# Patient Record
Sex: Male | Born: 1980
Health system: Southern US, Community
[De-identification: ages and names within clinical notes are randomized; demographics above are authoritative.]

## PROBLEM LIST (undated history)

## (undated) DIAGNOSIS — B958 Unspecified staphylococcus as the cause of diseases classified elsewhere: Secondary | ICD-10-CM

---
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
## 2001-03-22 ENCOUNTER — Encounter: Payer: Self-pay | Admitting: Family Medicine

## 2001-03-22 ENCOUNTER — Encounter: Admission: RE | Admit: 2001-03-22 | Discharge: 2001-03-22 | Payer: Self-pay | Admitting: Family Medicine

## 2019-07-22 ENCOUNTER — Other Ambulatory Visit: Payer: Self-pay

## 2019-07-22 ENCOUNTER — Emergency Department (HOSPITAL_COMMUNITY)
Admission: EM | Admit: 2019-07-22 | Discharge: 2019-07-22 | Disposition: A | Discharge status: Home / Self Care | Payer: 59 | Attending: Emergency Medicine | Admitting: Emergency Medicine

## 2019-07-22 DIAGNOSIS — Y999 Unspecified external cause status: Secondary | ICD-10-CM | POA: Diagnosis not present

## 2019-07-22 DIAGNOSIS — Y939 Activity, unspecified: Secondary | ICD-10-CM | POA: Insufficient documentation

## 2019-07-22 DIAGNOSIS — X58XXXA Exposure to other specified factors, initial encounter: Secondary | ICD-10-CM | POA: Diagnosis not present

## 2019-07-22 DIAGNOSIS — M5431 Sciatica, right side: Secondary | ICD-10-CM | POA: Diagnosis not present

## 2019-07-22 DIAGNOSIS — Y929 Unspecified place or not applicable: Secondary | ICD-10-CM | POA: Insufficient documentation

## 2019-07-22 DIAGNOSIS — S39012A Strain of muscle, fascia and tendon of lower back, initial encounter: Secondary | ICD-10-CM | POA: Insufficient documentation

## 2019-07-22 DIAGNOSIS — S3992XA Unspecified injury of lower back, initial encounter: Secondary | ICD-10-CM | POA: Diagnosis present

## 2019-07-22 MED ORDER — METAXALONE 800 MG PO TABS: 800.0000 mg | ORAL_TABLET | Freq: Three times a day (TID) | ORAL | 0 refills | Status: DC

## 2019-07-22 MED ORDER — TRAMADOL HCL 50 MG PO TABS: 50.0000 mg | ORAL_TABLET | Freq: Four times a day (QID) | ORAL | 0 refills | Status: DC | PRN

## 2019-07-22 MED ORDER — PREDNISONE 50 MG PO TABS: 50.0000 mg | ORAL_TABLET | Freq: Every day | ORAL | 0 refills | Status: DC

## 2019-07-22 NOTE — ED Triage Notes (Signed)
Pt states that approx 5 days ago he started having lower back spasms. Was seen at urgent care who prescribed cyclobenzaprine 5mg  and diclofenac 50mg . Pt states that he thinks he is having adverse side effects to these medications. Muscle relaxer is making him "feel like a zombie" and antiinflammatory, although worked for pain, made his urine dark which caused him to DC taking meds. Pt in pain and having spasms now that he is off of these meds.

## 2019-07-22 NOTE — ED Provider Notes (Signed)
[facility] DEPT Provider Note   CSN: [id number] Arrival date & time: 07/22/19  2051     History   Chief Complaint Chief Complaint  Patient presents with  . Back Pain    HPI William [name] is a 38 y.o. male.     HPI Patient presents to the emergency department with lower back pain that started 5 days ago.  The patient states he was seen in urgent care and given muscle relaxants which he states made him feel like a zombie.  He states that he also got a NSAID which she stated made his urine darker.  The patient states he stopped taking that but it did help with the pain.  The patient states that nothing seems make his condition worse other than certain positions and movements.  Patient states that he is never had any significant back troubles in the past.  The patient denies chest pain, shortness of breath, headache,blurred vision, neck pain, fever, cough, weakness, numbness, dizziness, anorexia, edema, abdominal pain, nausea, vomiting, diarrhea, rash, dysuria, hematemesis, bloody stool, near syncope, or syncope. No past medical history on file.  There are no active problems to display for this patient.         Home Medications    Prior to Admission medications   Not on File    Family History No family history on file.  Social History Social History   Tobacco Use  . Smoking status: Not on file  Substance Use Topics  . Alcohol use: Not on file  . Drug use: Not on file     Allergies   Patient has no allergy information on record.   Review of Systems Review of Systems All other systems negative except as documented in the HPI. All pertinent positives and negatives as reviewed in the HPI.  Physical Exam Updated Vital Signs BP (!) 158/97 (BP Location: Right Arm)   Pulse (!) 104   Temp 98 F (36.7 C) (Oral)   Resp 18   Ht 6\' 2"  (1.88 m)   Wt 88 kg   SpO2 94%   BMI 24.91 kg/m   Physical Exam Vitals signs and nursing note  reviewed.  Constitutional:      General: He is not in acute distress.    Appearance: He is well-developed.  HENT:     Head: Normocephalic and atraumatic.  Eyes:     Pupils: Pupils are equal, round, and reactive to light.  Pulmonary:     Effort: Pulmonary effort is normal.  Musculoskeletal:     Lumbar back: He exhibits tenderness and pain. He exhibits normal range of motion, no bony tenderness, no swelling and no deformity.  Skin:    General: Skin is warm and dry.  Neurological:     Mental Status: He is alert and oriented to person, place, and time.     GCS: GCS eye subscore is 4. GCS verbal subscore is 5. GCS motor subscore is 6.     Sensory: Sensation is intact. No sensory deficit.     Motor: No weakness or abnormal muscle tone.     Coordination: Coordination normal.     Gait: Gait normal.     Deep Tendon Reflexes: Reflexes are normal and symmetric.      ED Treatments / Results  Labs (all labs ordered are listed, but only abnormal results are displayed) Labs Reviewed - No data to display  EKG None  Radiology No results found.  Procedures Procedures (including critical care  time)  Medications Ordered in ED Medications - No data to display   Initial Impression / Assessment and Plan / ED Course  I have reviewed the triage vital signs and the nursing notes.  Pertinent labs & imaging results that were available during my care of the patient were reviewed by me and considered in my medical decision making (see chart for details).       Patient has no neurological deficits noted on exam.  I do feel that he has a lumbar sacral strain and that he has some sciatic irritation as well.  Have advised the patient to use ice and heat on his lower back.  Told to return here as needed.  We will give steroids and pain control.  Told to follow-up with a primary doctor.   Final Clinical Impressions(s) / ED Diagnoses   Final diagnoses:  None    ED Discharge Orders    None        Charlestine NightLawyer, Masud Holub, PA-C 07/22/19 2209    Samuel JesterMcManus, Kathleen, DO 07/26/19 [phone]

## 2019-07-31 ENCOUNTER — Encounter (HOSPITAL_COMMUNITY): Payer: Self-pay

## 2019-07-31 ENCOUNTER — Inpatient Hospital Stay (HOSPITAL_COMMUNITY)
Admission: EM | Admit: 2019-07-31 | Discharge: 2019-10-03 | DRG: 853 | Disposition: A | Discharge status: Home / Self Care | Payer: BC Managed Care – PPO | Attending: Family Medicine | Admitting: Family Medicine

## 2019-07-31 ENCOUNTER — Other Ambulatory Visit: Payer: Self-pay

## 2019-07-31 DIAGNOSIS — J188 Other pneumonia, unspecified organism: Secondary | ICD-10-CM | POA: Diagnosis not present

## 2019-07-31 DIAGNOSIS — I083 Combined rheumatic disorders of mitral, aortic and tricuspid valves: Secondary | ICD-10-CM | POA: Diagnosis not present

## 2019-07-31 DIAGNOSIS — Z8739 Personal history of other diseases of the musculoskeletal system and connective tissue: Secondary | ICD-10-CM | POA: Diagnosis present

## 2019-07-31 DIAGNOSIS — S21109A Unspecified open wound of unspecified front wall of thorax without penetration into thoracic cavity, initial encounter: Secondary | ICD-10-CM | POA: Diagnosis not present

## 2019-07-31 DIAGNOSIS — M4628 Osteomyelitis of vertebra, sacral and sacrococcygeal region: Secondary | ICD-10-CM | POA: Diagnosis present

## 2019-07-31 DIAGNOSIS — R079 Chest pain, unspecified: Secondary | ICD-10-CM

## 2019-07-31 DIAGNOSIS — M4620 Osteomyelitis of vertebra, site unspecified: Secondary | ICD-10-CM

## 2019-07-31 DIAGNOSIS — M4126 Other idiopathic scoliosis, lumbar region: Secondary | ICD-10-CM | POA: Diagnosis present

## 2019-07-31 DIAGNOSIS — M25562 Pain in left knee: Secondary | ICD-10-CM | POA: Diagnosis not present

## 2019-07-31 DIAGNOSIS — D638 Anemia in other chronic diseases classified elsewhere: Secondary | ICD-10-CM | POA: Diagnosis present

## 2019-07-31 DIAGNOSIS — M4626 Osteomyelitis of vertebra, lumbar region: Secondary | ICD-10-CM | POA: Diagnosis not present

## 2019-07-31 DIAGNOSIS — M25511 Pain in right shoulder: Secondary | ICD-10-CM | POA: Diagnosis not present

## 2019-07-31 DIAGNOSIS — M62838 Other muscle spasm: Secondary | ICD-10-CM | POA: Diagnosis not present

## 2019-07-31 DIAGNOSIS — R6520 Severe sepsis without septic shock: Secondary | ICD-10-CM | POA: Diagnosis not present

## 2019-07-31 DIAGNOSIS — F1990 Other psychoactive substance use, unspecified, uncomplicated: Secondary | ICD-10-CM | POA: Diagnosis present

## 2019-07-31 DIAGNOSIS — M86121 Other acute osteomyelitis, right humerus: Secondary | ICD-10-CM | POA: Diagnosis not present

## 2019-07-31 DIAGNOSIS — M79672 Pain in left foot: Secondary | ICD-10-CM | POA: Diagnosis not present

## 2019-07-31 DIAGNOSIS — Z4682 Encounter for fitting and adjustment of non-vascular catheter: Secondary | ICD-10-CM | POA: Diagnosis not present

## 2019-07-31 DIAGNOSIS — I2690 Septic pulmonary embolism without acute cor pulmonale: Secondary | ICD-10-CM | POA: Diagnosis not present

## 2019-07-31 DIAGNOSIS — M6008 Infective myositis, other site: Secondary | ICD-10-CM | POA: Diagnosis present

## 2019-07-31 DIAGNOSIS — M6289 Other specified disorders of muscle: Secondary | ICD-10-CM | POA: Diagnosis not present

## 2019-07-31 DIAGNOSIS — L02414 Cutaneous abscess of left upper limb: Secondary | ICD-10-CM | POA: Diagnosis not present

## 2019-07-31 DIAGNOSIS — L02213 Cutaneous abscess of chest wall: Secondary | ICD-10-CM | POA: Diagnosis present

## 2019-07-31 DIAGNOSIS — K5900 Constipation, unspecified: Secondary | ICD-10-CM | POA: Diagnosis not present

## 2019-07-31 DIAGNOSIS — N19 Unspecified kidney failure: Secondary | ICD-10-CM | POA: Diagnosis present

## 2019-07-31 DIAGNOSIS — M869 Osteomyelitis, unspecified: Secondary | ICD-10-CM | POA: Diagnosis not present

## 2019-07-31 DIAGNOSIS — T8459XA Infection and inflammatory reaction due to other internal joint prosthesis, initial encounter: Secondary | ICD-10-CM | POA: Diagnosis not present

## 2019-07-31 DIAGNOSIS — R29890 Loss of height: Secondary | ICD-10-CM | POA: Diagnosis not present

## 2019-07-31 DIAGNOSIS — E1169 Type 2 diabetes mellitus with other specified complication: Secondary | ICD-10-CM | POA: Diagnosis not present

## 2019-07-31 DIAGNOSIS — J90 Pleural effusion, not elsewhere classified: Secondary | ICD-10-CM | POA: Diagnosis not present

## 2019-07-31 DIAGNOSIS — T40605A Adverse effect of unspecified narcotics, initial encounter: Secondary | ICD-10-CM | POA: Diagnosis not present

## 2019-07-31 DIAGNOSIS — F1910 Other psychoactive substance abuse, uncomplicated: Secondary | ICD-10-CM | POA: Diagnosis not present

## 2019-07-31 DIAGNOSIS — J984 Other disorders of lung: Secondary | ICD-10-CM | POA: Diagnosis not present

## 2019-07-31 DIAGNOSIS — M4627 Osteomyelitis of vertebra, lumbosacral region: Secondary | ICD-10-CM | POA: Diagnosis not present

## 2019-07-31 DIAGNOSIS — E119 Type 2 diabetes mellitus without complications: Secondary | ICD-10-CM | POA: Diagnosis not present

## 2019-07-31 DIAGNOSIS — M71171 Other infective bursitis, right ankle and foot: Secondary | ICD-10-CM | POA: Diagnosis present

## 2019-07-31 DIAGNOSIS — M7551 Bursitis of right shoulder: Secondary | ICD-10-CM | POA: Diagnosis not present

## 2019-07-31 DIAGNOSIS — A4101 Sepsis due to Methicillin susceptible Staphylococcus aureus: Principal | ICD-10-CM | POA: Diagnosis present

## 2019-07-31 DIAGNOSIS — J189 Pneumonia, unspecified organism: Secondary | ICD-10-CM | POA: Diagnosis not present

## 2019-07-31 DIAGNOSIS — I38 Endocarditis, valve unspecified: Secondary | ICD-10-CM | POA: Diagnosis present

## 2019-07-31 DIAGNOSIS — F1190 Opioid use, unspecified, uncomplicated: Secondary | ICD-10-CM | POA: Diagnosis not present

## 2019-07-31 DIAGNOSIS — M4646 Discitis, unspecified, lumbar region: Secondary | ICD-10-CM | POA: Diagnosis not present

## 2019-07-31 DIAGNOSIS — R7989 Other specified abnormal findings of blood chemistry: Secondary | ICD-10-CM | POA: Diagnosis not present

## 2019-07-31 DIAGNOSIS — G601 Refsum's disease: Secondary | ICD-10-CM | POA: Diagnosis not present

## 2019-07-31 DIAGNOSIS — R7881 Bacteremia: Secondary | ICD-10-CM | POA: Diagnosis not present

## 2019-07-31 DIAGNOSIS — E876 Hypokalemia: Secondary | ICD-10-CM | POA: Diagnosis not present

## 2019-07-31 DIAGNOSIS — F1110 Opioid abuse, uncomplicated: Secondary | ICD-10-CM | POA: Diagnosis present

## 2019-07-31 DIAGNOSIS — Z62810 Personal history of physical and sexual abuse in childhood: Secondary | ICD-10-CM | POA: Diagnosis present

## 2019-07-31 DIAGNOSIS — I76 Septic arterial embolism: Secondary | ICD-10-CM | POA: Diagnosis present

## 2019-07-31 DIAGNOSIS — Z79899 Other long term (current) drug therapy: Secondary | ICD-10-CM

## 2019-07-31 DIAGNOSIS — B9561 Methicillin susceptible Staphylococcus aureus infection as the cause of diseases classified elsewhere: Secondary | ICD-10-CM | POA: Diagnosis present

## 2019-07-31 DIAGNOSIS — N390 Urinary tract infection, site not specified: Secondary | ICD-10-CM | POA: Diagnosis not present

## 2019-07-31 DIAGNOSIS — G061 Intraspinal abscess and granuloma: Secondary | ICD-10-CM

## 2019-07-31 DIAGNOSIS — L02413 Cutaneous abscess of right upper limb: Secondary | ICD-10-CM | POA: Diagnosis present

## 2019-07-31 DIAGNOSIS — F4310 Post-traumatic stress disorder, unspecified: Secondary | ICD-10-CM | POA: Diagnosis not present

## 2019-07-31 DIAGNOSIS — Z7952 Long term (current) use of systemic steroids: Secondary | ICD-10-CM

## 2019-07-31 DIAGNOSIS — R0989 Other specified symptoms and signs involving the circulatory and respiratory systems: Secondary | ICD-10-CM | POA: Diagnosis not present

## 2019-07-31 DIAGNOSIS — M4649 Discitis, unspecified, multiple sites in spine: Secondary | ICD-10-CM | POA: Diagnosis present

## 2019-07-31 DIAGNOSIS — K5903 Drug induced constipation: Secondary | ICD-10-CM | POA: Diagnosis not present

## 2019-07-31 DIAGNOSIS — A419 Sepsis, unspecified organism: Secondary | ICD-10-CM | POA: Diagnosis present

## 2019-07-31 DIAGNOSIS — M868X2 Other osteomyelitis, upper arm: Secondary | ICD-10-CM | POA: Diagnosis not present

## 2019-07-31 DIAGNOSIS — I10 Essential (primary) hypertension: Secondary | ICD-10-CM | POA: Diagnosis present

## 2019-07-31 DIAGNOSIS — N179 Acute kidney failure, unspecified: Secondary | ICD-10-CM | POA: Diagnosis present

## 2019-07-31 DIAGNOSIS — M25561 Pain in right knee: Secondary | ICD-10-CM | POA: Diagnosis not present

## 2019-07-31 DIAGNOSIS — Z419 Encounter for procedure for purposes other than remedying health state, unspecified: Secondary | ICD-10-CM

## 2019-07-31 DIAGNOSIS — M009 Pyogenic arthritis, unspecified: Secondary | ICD-10-CM | POA: Diagnosis not present

## 2019-07-31 DIAGNOSIS — L02611 Cutaneous abscess of right foot: Secondary | ICD-10-CM | POA: Diagnosis not present

## 2019-07-31 DIAGNOSIS — M71111 Other infective bursitis, right shoulder: Secondary | ICD-10-CM | POA: Diagnosis present

## 2019-07-31 DIAGNOSIS — M5126 Other intervertebral disc displacement, lumbar region: Secondary | ICD-10-CM | POA: Diagnosis not present

## 2019-07-31 DIAGNOSIS — F1120 Opioid dependence, uncomplicated: Secondary | ICD-10-CM | POA: Diagnosis not present

## 2019-07-31 DIAGNOSIS — R000 Tachycardia, unspecified: Secondary | ICD-10-CM | POA: Diagnosis not present

## 2019-07-31 DIAGNOSIS — Z09 Encounter for follow-up examination after completed treatment for conditions other than malignant neoplasm: Secondary | ICD-10-CM

## 2019-07-31 DIAGNOSIS — Z978 Presence of other specified devices: Secondary | ICD-10-CM | POA: Diagnosis not present

## 2019-07-31 DIAGNOSIS — R9389 Abnormal findings on diagnostic imaging of other specified body structures: Secondary | ICD-10-CM | POA: Diagnosis not present

## 2019-07-31 DIAGNOSIS — E118 Type 2 diabetes mellitus with unspecified complications: Secondary | ICD-10-CM | POA: Diagnosis not present

## 2019-07-31 DIAGNOSIS — R918 Other nonspecific abnormal finding of lung field: Secondary | ICD-10-CM | POA: Diagnosis not present

## 2019-07-31 DIAGNOSIS — M79671 Pain in right foot: Secondary | ICD-10-CM | POA: Diagnosis not present

## 2019-07-31 DIAGNOSIS — R739 Hyperglycemia, unspecified: Secondary | ICD-10-CM | POA: Diagnosis present

## 2019-07-31 DIAGNOSIS — Z7151 Drug abuse counseling and surveillance of drug abuser: Secondary | ICD-10-CM

## 2019-07-31 DIAGNOSIS — M86021 Acute hematogenous osteomyelitis, right humerus: Secondary | ICD-10-CM | POA: Diagnosis not present

## 2019-07-31 DIAGNOSIS — M00811 Arthritis due to other bacteria, right shoulder: Secondary | ICD-10-CM | POA: Diagnosis not present

## 2019-07-31 DIAGNOSIS — B958 Unspecified staphylococcus as the cause of diseases classified elsewhere: Secondary | ICD-10-CM

## 2019-07-31 DIAGNOSIS — G4700 Insomnia, unspecified: Secondary | ICD-10-CM | POA: Diagnosis not present

## 2019-07-31 DIAGNOSIS — A4189 Other specified sepsis: Secondary | ICD-10-CM | POA: Diagnosis not present

## 2019-07-31 DIAGNOSIS — E1165 Type 2 diabetes mellitus with hyperglycemia: Secondary | ICD-10-CM | POA: Diagnosis present

## 2019-07-31 DIAGNOSIS — M75111 Incomplete rotator cuff tear or rupture of right shoulder, not specified as traumatic: Secondary | ICD-10-CM | POA: Diagnosis not present

## 2019-07-31 DIAGNOSIS — M4647 Discitis, unspecified, lumbosacral region: Secondary | ICD-10-CM | POA: Diagnosis not present

## 2019-07-31 DIAGNOSIS — Z452 Encounter for adjustment and management of vascular access device: Secondary | ICD-10-CM | POA: Diagnosis not present

## 2019-07-31 DIAGNOSIS — Z938 Other artificial opening status: Secondary | ICD-10-CM

## 2019-07-31 DIAGNOSIS — R509 Fever, unspecified: Secondary | ICD-10-CM | POA: Diagnosis not present

## 2019-07-31 DIAGNOSIS — M62830 Muscle spasm of back: Secondary | ICD-10-CM | POA: Diagnosis not present

## 2019-07-31 DIAGNOSIS — Z20828 Contact with and (suspected) exposure to other viral communicable diseases: Secondary | ICD-10-CM | POA: Diagnosis present

## 2019-07-31 DIAGNOSIS — F419 Anxiety disorder, unspecified: Secondary | ICD-10-CM | POA: Diagnosis present

## 2019-07-31 DIAGNOSIS — Z79891 Long term (current) use of opiate analgesic: Secondary | ICD-10-CM

## 2019-07-31 DIAGNOSIS — Z539 Procedure and treatment not carried out, unspecified reason: Secondary | ICD-10-CM | POA: Diagnosis not present

## 2019-07-31 DIAGNOSIS — R778 Other specified abnormalities of plasma proteins: Secondary | ICD-10-CM | POA: Diagnosis present

## 2019-07-31 DIAGNOSIS — R911 Solitary pulmonary nodule: Secondary | ICD-10-CM | POA: Diagnosis not present

## 2019-07-31 DIAGNOSIS — R110 Nausea: Secondary | ICD-10-CM | POA: Diagnosis not present

## 2019-07-31 DIAGNOSIS — S46811A Strain of other muscles, fascia and tendons at shoulder and upper arm level, right arm, initial encounter: Secondary | ICD-10-CM | POA: Diagnosis present

## 2019-07-31 DIAGNOSIS — G8918 Other acute postprocedural pain: Secondary | ICD-10-CM | POA: Diagnosis not present

## 2019-07-31 HISTORY — DX: Unspecified staphylococcus as the cause of diseases classified elsewhere: B95.8

## 2019-07-31 LAB — CBC WITH DIFFERENTIAL/PLATELET
Abs Immature Granulocytes: 0 10*3/uL (ref 0.00–0.07)
Basophils Absolute: 0 10*3/uL (ref 0.0–0.1)
Basophils Relative: 0 %
Eosinophils Absolute: 0 10*3/uL (ref 0.0–0.5)
Eosinophils Relative: 0 %
HCT: 32.3 % — ABNORMAL LOW (ref 39.0–52.0)
Hemoglobin: 11.1 g/dL — ABNORMAL LOW (ref 13.0–17.0)
Lymphocytes Relative: 1 %
Lymphs Abs: 0.2 10*3/uL — ABNORMAL LOW (ref 0.7–4.0)
MCH: 30.5 pg (ref 26.0–34.0)
MCHC: 34.4 g/dL (ref 30.0–36.0)
MCV: 88.7 fL (ref 80.0–100.0)
Monocytes Absolute: 0.6 10*3/uL (ref 0.1–1.0)
Monocytes Relative: 3 %
Neutro Abs: 18.5 10*3/uL — ABNORMAL HIGH (ref 1.7–7.7)
Neutrophils Relative %: 96 %
Platelets: 347 10*3/uL (ref 150–400)
RBC: 3.64 MIL/uL — ABNORMAL LOW (ref 4.22–5.81)
RDW: 15.5 % (ref 11.5–15.5)
WBC: 19.3 10*3/uL — ABNORMAL HIGH (ref 4.0–10.5)
nRBC: 0 % (ref 0.0–0.2)
nRBC: 1 /100 WBC — ABNORMAL HIGH

## 2019-07-31 LAB — COMPREHENSIVE METABOLIC PANEL
ALT: 25 U/L (ref 0–44)
AST: 19 U/L (ref 15–41)
Albumin: 1.7 g/dL — ABNORMAL LOW (ref 3.5–5.0)
Alkaline Phosphatase: 276 U/L — ABNORMAL HIGH (ref 38–126)
Anion gap: 14 (ref 5–15)
BUN: 53 mg/dL — ABNORMAL HIGH (ref 6–20)
CO2: 27 mmol/L (ref 22–32)
Calcium: 8.4 mg/dL — ABNORMAL LOW (ref 8.9–10.3)
Chloride: 79 mmol/L — ABNORMAL LOW (ref 98–111)
Creatinine, Ser: 1.62 mg/dL — ABNORMAL HIGH (ref 0.61–1.24)
GFR calc Af Amer: >60 mL/min (ref >60)
GFR calc non Af Amer: 53 mL/min — ABNORMAL LOW (ref >60)
Glucose, Bld: 701 mg/dL (ref 70–99)
Potassium: 6 mmol/L — ABNORMAL HIGH (ref 3.5–5.1)
Sodium: 120 mmol/L — ABNORMAL LOW (ref 135–145)
Total Bilirubin: 2.7 mg/dL — ABNORMAL HIGH (ref 0.3–1.2)
Total Protein: 6.9 g/dL (ref 6.5–8.1)

## 2019-07-31 LAB — URINALYSIS, ROUTINE W REFLEX MICROSCOPIC
Bilirubin Urine: NEGATIVE
Glucose, UA: >=500 mg/dL — AB
Ketones, ur: NEGATIVE mg/dL
Leukocytes,Ua: NEGATIVE
Nitrite: NEGATIVE
Protein, ur: 30 mg/dL — AB
Specific Gravity, Urine: 1.016 (ref 1.005–1.030)
pH: 5 (ref 5.0–8.0)

## 2019-07-31 LAB — LACTIC ACID, PLASMA: Lactic Acid, Venous: 3.7 mmol/L (ref 0.5–1.9)

## 2019-07-31 LAB — PROTIME-INR
INR: 1.3 — ABNORMAL HIGH (ref 0.8–1.2)
Prothrombin Time: 15.9 seconds — ABNORMAL HIGH (ref 11.4–15.2)

## 2019-07-31 LAB — APTT: aPTT: 29 seconds (ref 24–36)

## 2019-07-31 MED ORDER — ACETAMINOPHEN 325 MG PO TABS
325.0000 mg | ORAL_TABLET | Freq: Once | ORAL | Status: AC
  Administered 2019-08-01: 325 mg via ORAL
  Filled 2019-07-31: qty 1

## 2019-07-31 NOTE — ED Notes (Signed)
Pt states he took two oxycodone today

## 2019-07-31 NOTE — ED Triage Notes (Signed)
Pt reports he's been in and out of UC for the last few days for chronic joint pain and generalized muscle pain.also reports abscess to his Right foot. Reports he has not eaten in 5 days and has not felt himself

## 2019-08-01 ENCOUNTER — Inpatient Hospital Stay (HOSPITAL_COMMUNITY): Payer: BC Managed Care – PPO | Admitting: Certified Registered Nurse Anesthetist

## 2019-08-01 ENCOUNTER — Inpatient Hospital Stay (HOSPITAL_COMMUNITY): Payer: BC Managed Care – PPO

## 2019-08-01 ENCOUNTER — Emergency Department (HOSPITAL_COMMUNITY): Payer: BC Managed Care – PPO

## 2019-08-01 ENCOUNTER — Encounter (HOSPITAL_COMMUNITY)
Admission: EM | Disposition: A | Discharge status: Home / Self Care | Payer: Self-pay | Source: Home / Self Care | Attending: Internal Medicine

## 2019-08-01 ENCOUNTER — Encounter (HOSPITAL_COMMUNITY): Payer: Self-pay | Admitting: Radiology

## 2019-08-01 DIAGNOSIS — R911 Solitary pulmonary nodule: Secondary | ICD-10-CM | POA: Diagnosis not present

## 2019-08-01 DIAGNOSIS — B9561 Methicillin susceptible Staphylococcus aureus infection as the cause of diseases classified elsewhere: Secondary | ICD-10-CM | POA: Diagnosis not present

## 2019-08-01 DIAGNOSIS — A4189 Other specified sepsis: Secondary | ICD-10-CM

## 2019-08-01 DIAGNOSIS — R6520 Severe sepsis without septic shock: Secondary | ICD-10-CM

## 2019-08-01 DIAGNOSIS — M4626 Osteomyelitis of vertebra, lumbar region: Secondary | ICD-10-CM | POA: Diagnosis present

## 2019-08-01 DIAGNOSIS — G061 Intraspinal abscess and granuloma: Secondary | ICD-10-CM | POA: Diagnosis not present

## 2019-08-01 DIAGNOSIS — F1990 Other psychoactive substance use, unspecified, uncomplicated: Secondary | ICD-10-CM | POA: Diagnosis present

## 2019-08-01 DIAGNOSIS — M71111 Other infective bursitis, right shoulder: Secondary | ICD-10-CM | POA: Diagnosis present

## 2019-08-01 DIAGNOSIS — R0989 Other specified symptoms and signs involving the circulatory and respiratory systems: Secondary | ICD-10-CM | POA: Diagnosis not present

## 2019-08-01 DIAGNOSIS — L02213 Cutaneous abscess of chest wall: Secondary | ICD-10-CM | POA: Diagnosis not present

## 2019-08-01 DIAGNOSIS — M86021 Acute hematogenous osteomyelitis, right humerus: Secondary | ICD-10-CM | POA: Diagnosis present

## 2019-08-01 DIAGNOSIS — F1910 Other psychoactive substance abuse, uncomplicated: Secondary | ICD-10-CM | POA: Diagnosis not present

## 2019-08-01 DIAGNOSIS — A4101 Sepsis due to Methicillin susceptible Staphylococcus aureus: Secondary | ICD-10-CM | POA: Diagnosis present

## 2019-08-01 DIAGNOSIS — M4628 Osteomyelitis of vertebra, sacral and sacrococcygeal region: Secondary | ICD-10-CM | POA: Diagnosis present

## 2019-08-01 DIAGNOSIS — R079 Chest pain, unspecified: Secondary | ICD-10-CM | POA: Diagnosis not present

## 2019-08-01 DIAGNOSIS — F1120 Opioid dependence, uncomplicated: Secondary | ICD-10-CM | POA: Diagnosis present

## 2019-08-01 DIAGNOSIS — M4627 Osteomyelitis of vertebra, lumbosacral region: Secondary | ICD-10-CM | POA: Diagnosis not present

## 2019-08-01 DIAGNOSIS — R739 Hyperglycemia, unspecified: Secondary | ICD-10-CM | POA: Diagnosis not present

## 2019-08-01 DIAGNOSIS — R9389 Abnormal findings on diagnostic imaging of other specified body structures: Secondary | ICD-10-CM | POA: Diagnosis not present

## 2019-08-01 DIAGNOSIS — N19 Unspecified kidney failure: Secondary | ICD-10-CM | POA: Diagnosis present

## 2019-08-01 DIAGNOSIS — M4646 Discitis, unspecified, lumbar region: Secondary | ICD-10-CM | POA: Diagnosis not present

## 2019-08-01 DIAGNOSIS — F1110 Opioid abuse, uncomplicated: Secondary | ICD-10-CM

## 2019-08-01 DIAGNOSIS — R7989 Other specified abnormal findings of blood chemistry: Secondary | ICD-10-CM | POA: Diagnosis not present

## 2019-08-01 DIAGNOSIS — A419 Sepsis, unspecified organism: Secondary | ICD-10-CM | POA: Diagnosis not present

## 2019-08-01 DIAGNOSIS — M6008 Infective myositis, other site: Secondary | ICD-10-CM | POA: Diagnosis present

## 2019-08-01 DIAGNOSIS — N179 Acute kidney failure, unspecified: Secondary | ICD-10-CM

## 2019-08-01 DIAGNOSIS — I76 Septic arterial embolism: Secondary | ICD-10-CM | POA: Diagnosis present

## 2019-08-01 DIAGNOSIS — I38 Endocarditis, valve unspecified: Secondary | ICD-10-CM | POA: Diagnosis present

## 2019-08-01 DIAGNOSIS — M00811 Arthritis due to other bacteria, right shoulder: Secondary | ICD-10-CM | POA: Diagnosis present

## 2019-08-01 DIAGNOSIS — M71171 Other infective bursitis, right ankle and foot: Secondary | ICD-10-CM | POA: Diagnosis present

## 2019-08-01 DIAGNOSIS — L02611 Cutaneous abscess of right foot: Secondary | ICD-10-CM | POA: Diagnosis present

## 2019-08-01 DIAGNOSIS — E1169 Type 2 diabetes mellitus with other specified complication: Secondary | ICD-10-CM | POA: Diagnosis present

## 2019-08-01 DIAGNOSIS — Z20828 Contact with and (suspected) exposure to other viral communicable diseases: Secondary | ICD-10-CM | POA: Diagnosis present

## 2019-08-01 DIAGNOSIS — M4126 Other idiopathic scoliosis, lumbar region: Secondary | ICD-10-CM | POA: Diagnosis present

## 2019-08-01 DIAGNOSIS — M869 Osteomyelitis, unspecified: Secondary | ICD-10-CM | POA: Diagnosis not present

## 2019-08-01 DIAGNOSIS — R509 Fever, unspecified: Secondary | ICD-10-CM | POA: Diagnosis not present

## 2019-08-01 DIAGNOSIS — N390 Urinary tract infection, site not specified: Secondary | ICD-10-CM | POA: Diagnosis present

## 2019-08-01 DIAGNOSIS — L02413 Cutaneous abscess of right upper limb: Secondary | ICD-10-CM | POA: Diagnosis present

## 2019-08-01 DIAGNOSIS — M5126 Other intervertebral disc displacement, lumbar region: Secondary | ICD-10-CM | POA: Diagnosis not present

## 2019-08-01 DIAGNOSIS — J189 Pneumonia, unspecified organism: Secondary | ICD-10-CM | POA: Diagnosis present

## 2019-08-01 DIAGNOSIS — I2690 Septic pulmonary embolism without acute cor pulmonale: Secondary | ICD-10-CM | POA: Diagnosis present

## 2019-08-01 DIAGNOSIS — R7881 Bacteremia: Secondary | ICD-10-CM | POA: Diagnosis not present

## 2019-08-01 DIAGNOSIS — M4649 Discitis, unspecified, multiple sites in spine: Secondary | ICD-10-CM | POA: Diagnosis present

## 2019-08-01 DIAGNOSIS — R918 Other nonspecific abnormal finding of lung field: Secondary | ICD-10-CM | POA: Diagnosis not present

## 2019-08-01 DIAGNOSIS — Z8739 Personal history of other diseases of the musculoskeletal system and connective tissue: Secondary | ICD-10-CM | POA: Diagnosis present

## 2019-08-01 DIAGNOSIS — I083 Combined rheumatic disorders of mitral, aortic and tricuspid valves: Secondary | ICD-10-CM | POA: Diagnosis not present

## 2019-08-01 DIAGNOSIS — M6289 Other specified disorders of muscle: Secondary | ICD-10-CM | POA: Diagnosis present

## 2019-08-01 DIAGNOSIS — R778 Other specified abnormalities of plasma proteins: Secondary | ICD-10-CM | POA: Diagnosis present

## 2019-08-01 HISTORY — PX: STERNAL WOUND DEBRIDEMENT: SHX1058

## 2019-08-01 HISTORY — PX: TEE WITHOUT CARDIOVERSION: SHX5443

## 2019-08-01 LAB — POCT I-STAT 7, (LYTES, BLD GAS, ICA,H+H)
Acid-Base Excess: 7 mmol/L — ABNORMAL HIGH (ref 0.0–2.0)
Bicarbonate: 31 mmol/L — ABNORMAL HIGH (ref 20.0–28.0)
Calcium, Ion: 1.17 mmol/L (ref 1.15–1.40)
HCT: 28 % — ABNORMAL LOW (ref 39.0–52.0)
Hemoglobin: 9.5 g/dL — ABNORMAL LOW (ref 13.0–17.0)
O2 Saturation: 97 %
Patient temperature: 100
Potassium: 4.1 mmol/L (ref 3.5–5.1)
Sodium: 133 mmol/L — ABNORMAL LOW (ref 135–145)
TCO2: 32 mmol/L (ref 22–32)
pCO2 arterial: 41.7 mmHg (ref 32.0–48.0)
pH, Arterial: 7.482 — ABNORMAL HIGH (ref 7.350–7.450)
pO2, Arterial: 83 mmHg (ref 83.0–108.0)

## 2019-08-01 LAB — ECHO INTRAOPERATIVE TEE
Height: 74 in
Weight: 2992 oz

## 2019-08-01 LAB — RAPID URINE DRUG SCREEN, HOSP PERFORMED
Amphetamines: NOT DETECTED
Barbiturates: NOT DETECTED
Benzodiazepines: NOT DETECTED
Cocaine: NOT DETECTED
Opiates: POSITIVE — AB
Tetrahydrocannabinol: NOT DETECTED

## 2019-08-01 LAB — GLUCOSE, CAPILLARY
Glucose-Capillary: 324 mg/dL — ABNORMAL HIGH (ref 70–99)
Glucose-Capillary: 202 mg/dL — ABNORMAL HIGH (ref 70–99)
Glucose-Capillary: 165 mg/dL — ABNORMAL HIGH (ref 70–99)
Glucose-Capillary: 184 mg/dL — ABNORMAL HIGH (ref 70–99)
Glucose-Capillary: 162 mg/dL — ABNORMAL HIGH (ref 70–99)
Glucose-Capillary: 149 mg/dL — ABNORMAL HIGH (ref 70–99)
Glucose-Capillary: 166 mg/dL — ABNORMAL HIGH (ref 70–99)
Glucose-Capillary: 189 mg/dL — ABNORMAL HIGH (ref 70–99)
Glucose-Capillary: 236 mg/dL — ABNORMAL HIGH (ref 70–99)
Glucose-Capillary: 276 mg/dL — ABNORMAL HIGH (ref 70–99)

## 2019-08-01 LAB — COMPREHENSIVE METABOLIC PANEL
ALT: 18 U/L (ref 0–44)
AST: 20 U/L (ref 15–41)
Albumin: 1.4 g/dL — ABNORMAL LOW (ref 3.5–5.0)
Alkaline Phosphatase: 178 U/L — ABNORMAL HIGH (ref 38–126)
Anion gap: 11 (ref 5–15)
BUN: 39 mg/dL — ABNORMAL HIGH (ref 6–20)
CO2: 26 mmol/L (ref 22–32)
Calcium: 8 mg/dL — ABNORMAL LOW (ref 8.9–10.3)
Chloride: 97 mmol/L — ABNORMAL LOW (ref 98–111)
Creatinine, Ser: 1.25 mg/dL — ABNORMAL HIGH (ref 0.61–1.24)
GFR calc Af Amer: >60 mL/min (ref >60)
GFR calc non Af Amer: >60 mL/min (ref >60)
Glucose, Bld: 179 mg/dL — ABNORMAL HIGH (ref 70–99)
Potassium: 4.5 mmol/L (ref 3.5–5.1)
Sodium: 134 mmol/L — ABNORMAL LOW (ref 135–145)
Total Bilirubin: 2.6 mg/dL — ABNORMAL HIGH (ref 0.3–1.2)
Total Protein: 5.3 g/dL — ABNORMAL LOW (ref 6.5–8.1)

## 2019-08-01 LAB — HEMOGLOBIN A1C
Hgb A1c MFr Bld: 7.8 % — ABNORMAL HIGH (ref 4.8–5.6)
Mean Plasma Glucose: 177.16 mg/dL

## 2019-08-01 LAB — CBC
HCT: 25.8 % — ABNORMAL LOW (ref 39.0–52.0)
Hemoglobin: 8.9 g/dL — ABNORMAL LOW (ref 13.0–17.0)
MCH: 30.1 pg (ref 26.0–34.0)
MCHC: 34.5 g/dL (ref 30.0–36.0)
MCV: 87.2 fL (ref 80.0–100.0)
Platelets: 291 10*3/uL (ref 150–400)
RBC: 2.96 MIL/uL — ABNORMAL LOW (ref 4.22–5.81)
RDW: 15.2 % (ref 11.5–15.5)
WBC: 13.7 10*3/uL — ABNORMAL HIGH (ref 4.0–10.5)
nRBC: 0 % (ref 0.0–0.2)

## 2019-08-01 LAB — URINALYSIS, ROUTINE W REFLEX MICROSCOPIC
Bilirubin Urine: NEGATIVE
Glucose, UA: 50 mg/dL — AB
Ketones, ur: NEGATIVE mg/dL
Nitrite: NEGATIVE
Protein, ur: 100 mg/dL — AB
Specific Gravity, Urine: 1.013 (ref 1.005–1.030)
pH: 6 (ref 5.0–8.0)

## 2019-08-01 LAB — BLOOD CULTURE ID PANEL (REFLEXED)

## 2019-08-01 LAB — MAGNESIUM: Magnesium: 2.1 mg/dL (ref 1.7–2.4)

## 2019-08-01 LAB — TYPE AND SCREEN
ABO/RH(D): O POS
Antibody Screen: NEGATIVE

## 2019-08-01 LAB — CBG MONITORING, ED
Glucose-Capillary: 577 mg/dL (ref 70–99)
Glucose-Capillary: 416 mg/dL — ABNORMAL HIGH (ref 70–99)
Glucose-Capillary: 395 mg/dL — ABNORMAL HIGH (ref 70–99)

## 2019-08-01 LAB — SARS CORONAVIRUS 2 BY RT PCR (HOSPITAL ORDER, PERFORMED IN ~~LOC~~ HOSPITAL LAB): SARS Coronavirus 2: NEGATIVE

## 2019-08-01 LAB — APTT: aPTT: 31 seconds (ref 24–36)

## 2019-08-01 LAB — ABO/RH: ABO/RH(D): O POS

## 2019-08-01 LAB — CK: Total CK: 71 U/L (ref 49–397)

## 2019-08-01 LAB — ECHOCARDIOGRAM COMPLETE
Height: 74 in
Weight: 2992 oz

## 2019-08-01 LAB — TROPONIN I (HIGH SENSITIVITY): Troponin I (High Sensitivity): 130 ng/L (ref <18)

## 2019-08-01 LAB — PROTIME-INR
INR: 1.3 — ABNORMAL HIGH (ref 0.8–1.2)
Prothrombin Time: 16 seconds — ABNORMAL HIGH (ref 11.4–15.2)

## 2019-08-01 LAB — PHOSPHORUS: Phosphorus: 3.5 mg/dL (ref 2.5–4.6)

## 2019-08-01 LAB — LACTIC ACID, PLASMA: Lactic Acid, Venous: 3.5 mmol/L (ref 0.5–1.9)

## 2019-08-01 LAB — MRSA PCR SCREENING: MRSA by PCR: NEGATIVE

## 2019-08-01 LAB — ETHANOL: Alcohol, Ethyl (B): <10 mg/dL (ref <10)

## 2019-08-01 SURGERY — DEBRIDEMENT, WOUND, STERNUM
Anesthesia: General

## 2019-08-01 MED ORDER — ONDANSETRON HCL 4 MG/2ML IJ SOLN
INTRAMUSCULAR | Status: DC | PRN
  Administered 2019-08-01: 4 mg via INTRAVENOUS

## 2019-08-01 MED ORDER — SODIUM CHLORIDE 0.9 % IV BOLUS (SEPSIS)
1000.0000 mL | Freq: Once | INTRAVENOUS | Status: AC
  Administered 2019-08-01: 1000 mL via INTRAVENOUS

## 2019-08-01 MED ORDER — DEXTROSE 50 % IV SOLN: 25.0000 mL | INTRAVENOUS | Status: DC | PRN

## 2019-08-01 MED ORDER — HEPARIN SODIUM (PORCINE) 5000 UNIT/ML IJ SOLN
5000.0000 [IU] | Freq: Three times a day (TID) | INTRAMUSCULAR | Status: DC
  Administered 2019-08-02 – 2019-08-16 (×41): 5000 [IU] via SUBCUTANEOUS
  Filled 2019-08-01 (×42): qty 1

## 2019-08-01 MED ORDER — VANCOMYCIN HCL 1000 MG IV SOLR
INTRAVENOUS | Status: AC
  Filled 2019-08-01: qty 1000

## 2019-08-01 MED ORDER — ROCURONIUM BROMIDE 10 MG/ML (PF) SYRINGE
PREFILLED_SYRINGE | INTRAVENOUS | Status: AC
  Filled 2019-08-01: qty 10

## 2019-08-01 MED ORDER — PROMETHAZINE HCL 25 MG/ML IJ SOLN: 6.2500 mg | INTRAMUSCULAR | Status: DC | PRN

## 2019-08-01 MED ORDER — LACTATED RINGERS IV SOLN
INTRAVENOUS | Status: DC
  Administered 2019-08-01: 11:00:00 via INTRAVENOUS

## 2019-08-01 MED ORDER — DEXTROSE-NACL 5-0.45 % IV SOLN
INTRAVENOUS | Status: DC
  Administered 2019-08-01 (×2): via INTRAVENOUS

## 2019-08-01 MED ORDER — SODIUM CHLORIDE 0.9 % IV SOLN
1000.0000 mL | INTRAVENOUS | Status: DC
  Administered 2019-08-01 – 2019-08-03 (×7): 1000 mL via INTRAVENOUS

## 2019-08-01 MED ORDER — PROPOFOL 10 MG/ML IV BOLUS
INTRAVENOUS | Status: AC
  Filled 2019-08-01: qty 20

## 2019-08-01 MED ORDER — MIDAZOLAM HCL 2 MG/2ML IJ SOLN
INTRAMUSCULAR | Status: AC
  Filled 2019-08-01: qty 2

## 2019-08-01 MED ORDER — OXYCODONE HCL 5 MG PO TABS
ORAL_TABLET | ORAL | Status: AC
  Filled 2019-08-01: qty 1

## 2019-08-01 MED ORDER — ROCURONIUM BROMIDE 10 MG/ML (PF) SYRINGE
PREFILLED_SYRINGE | INTRAVENOUS | Status: AC
  Filled 2019-08-01: qty 20

## 2019-08-01 MED ORDER — INSULIN REGULAR(HUMAN) IN NACL 100-0.9 UT/100ML-% IV SOLN
INTRAVENOUS | Status: DC
  Administered 2019-08-01: 5.3 [IU]/h via INTRAVENOUS

## 2019-08-01 MED ORDER — DEXAMETHASONE SODIUM PHOSPHATE 10 MG/ML IJ SOLN
INTRAMUSCULAR | Status: AC
  Filled 2019-08-01: qty 1

## 2019-08-01 MED ORDER — VANCOMYCIN HCL IN DEXTROSE 1-5 GM/200ML-% IV SOLN
1000.0000 mg | Freq: Once | INTRAVENOUS | Status: AC
  Administered 2019-08-01: 1000 mg via INTRAVENOUS
  Filled 2019-08-01: qty 200

## 2019-08-01 MED ORDER — PROPOFOL 10 MG/ML IV BOLUS
INTRAVENOUS | Status: DC | PRN
  Administered 2019-08-01: 200 mg via INTRAVENOUS

## 2019-08-01 MED ORDER — INSULIN ASPART 100 UNIT/ML ~~LOC~~ SOLN
0.0000 [IU] | Freq: Every day | SUBCUTANEOUS | Status: DC
  Administered 2019-08-02: 3 [IU] via SUBCUTANEOUS
  Administered 2019-08-02 – 2019-09-11 (×8): 2 [IU] via SUBCUTANEOUS
  Administered 2019-09-13: 22:00:00 4 [IU] via SUBCUTANEOUS

## 2019-08-01 MED ORDER — OXYCODONE HCL 5 MG PO TABS
5.0000 mg | ORAL_TABLET | Freq: Once | ORAL | Status: AC | PRN
  Administered 2019-08-01: 5 mg via ORAL

## 2019-08-01 MED ORDER — OXYCODONE HCL 5 MG/5ML PO SOLN: 5.0000 mg | Freq: Once | ORAL | Status: AC | PRN

## 2019-08-01 MED ORDER — HYDROMORPHONE HCL 1 MG/ML IJ SOLN
INTRAMUSCULAR | Status: AC
  Filled 2019-08-01: qty 1

## 2019-08-01 MED ORDER — SODIUM CHLORIDE 0.9 % IR SOLN
Status: DC | PRN
  Administered 2019-08-01: 2000 mL

## 2019-08-01 MED ORDER — ROCURONIUM BROMIDE 50 MG/5ML IV SOSY: PREFILLED_SYRINGE | INTRAVENOUS | Status: DC | PRN

## 2019-08-01 MED ORDER — SUGAMMADEX SODIUM 200 MG/2ML IV SOLN
INTRAVENOUS | Status: DC | PRN
  Administered 2019-08-01: 200 mg via INTRAVENOUS

## 2019-08-01 MED ORDER — ONDANSETRON HCL 4 MG/2ML IJ SOLN
INTRAMUSCULAR | Status: AC
  Filled 2019-08-01: qty 2

## 2019-08-01 MED ORDER — FENTANYL CITRATE (PF) 250 MCG/5ML IJ SOLN
INTRAMUSCULAR | Status: AC
  Filled 2019-08-01: qty 5

## 2019-08-01 MED ORDER — HYDROMORPHONE HCL 1 MG/ML IJ SOLN
0.2500 mg | INTRAMUSCULAR | Status: DC | PRN
  Administered 2019-08-01 (×2): 0.5 mg via INTRAVENOUS

## 2019-08-01 MED ORDER — GADOBUTROL 1 MMOL/ML IV SOLN
8.5000 mL | Freq: Once | INTRAVENOUS | Status: AC | PRN
  Administered 2019-08-01: 8.5 mL via INTRAVENOUS

## 2019-08-01 MED ORDER — DEXAMETHASONE SODIUM PHOSPHATE 10 MG/ML IJ SOLN
INTRAMUSCULAR | Status: DC | PRN
  Administered 2019-08-01: 4 mg via INTRAVENOUS

## 2019-08-01 MED ORDER — CHLORHEXIDINE GLUCONATE CLOTH 2 % EX PADS
6.0000 | MEDICATED_PAD | Freq: Every day | CUTANEOUS | Status: DC
  Administered 2019-08-02 – 2019-09-08 (×32): 6 via TOPICAL

## 2019-08-01 MED ORDER — INSULIN REGULAR BOLUS VIA INFUSION
0.0000 [IU] | Freq: Three times a day (TID) | INTRAVENOUS | Status: DC
  Filled 2019-08-01: qty 10

## 2019-08-01 MED ORDER — HEPARIN SODIUM (PORCINE) 5000 UNIT/ML IJ SOLN: 5000.0000 [IU] | Freq: Three times a day (TID) | INTRAMUSCULAR | Status: DC

## 2019-08-01 MED ORDER — OXYCODONE HCL 5 MG PO TABS
5.0000 mg | ORAL_TABLET | Freq: Four times a day (QID) | ORAL | Status: DC | PRN
  Administered 2019-08-01 – 2019-08-07 (×11): 5 mg via ORAL
  Filled 2019-08-01 (×15): qty 1

## 2019-08-01 MED ORDER — SODIUM CHLORIDE 0.9 % IV SOLN: INTRAVENOUS | Status: DC

## 2019-08-01 MED ORDER — HYDROMORPHONE HCL 1 MG/ML IJ SOLN
1.0000 mg | INTRAMUSCULAR | Status: DC | PRN
  Administered 2019-08-01 – 2019-08-02 (×4): 1 mg via INTRAVENOUS
  Filled 2019-08-01 (×4): qty 1

## 2019-08-01 MED ORDER — VANCOMYCIN HCL IN DEXTROSE 750-5 MG/150ML-% IV SOLN
750.0000 mg | Freq: Three times a day (TID) | INTRAVENOUS | Status: DC
  Administered 2019-08-01 – 2019-08-02 (×3): 750 mg via INTRAVENOUS
  Filled 2019-08-01 (×5): qty 150

## 2019-08-01 MED ORDER — ONDANSETRON HCL 4 MG/2ML IJ SOLN
4.0000 mg | Freq: Once | INTRAMUSCULAR | Status: DC
  Filled 2019-08-01 (×6): qty 2

## 2019-08-01 MED ORDER — LIDOCAINE HCL (PF) 1 % IJ SOLN
5.0000 mL | Freq: Once | INTRAMUSCULAR | Status: AC
  Administered 2019-08-01: 02:00:00 5 mL via INTRADERMAL
  Filled 2019-08-01: qty 5

## 2019-08-01 MED ORDER — INSULIN ASPART 100 UNIT/ML ~~LOC~~ SOLN
0.0000 [IU] | Freq: Three times a day (TID) | SUBCUTANEOUS | Status: DC
  Administered 2019-08-01: 5 [IU] via SUBCUTANEOUS

## 2019-08-01 MED ORDER — FENTANYL CITRATE (PF) 100 MCG/2ML IJ SOLN
50.0000 ug | Freq: Once | INTRAMUSCULAR | Status: AC
  Administered 2019-08-01: 02:00:00 50 ug via INTRAVENOUS
  Filled 2019-08-01 (×2): qty 2

## 2019-08-01 MED ORDER — ROCURONIUM BROMIDE 50 MG/5ML IV SOSY
PREFILLED_SYRINGE | INTRAVENOUS | Status: DC | PRN
  Administered 2019-08-01: 50 mg via INTRAVENOUS
  Administered 2019-08-01: 25 mg via INTRAVENOUS

## 2019-08-01 MED ORDER — INSULIN ASPART 100 UNIT/ML ~~LOC~~ SOLN
0.0000 [IU] | Freq: Three times a day (TID) | SUBCUTANEOUS | Status: DC
  Administered 2019-08-02: 5 [IU] via SUBCUTANEOUS
  Administered 2019-08-02 (×2): 8 [IU] via SUBCUTANEOUS
  Administered 2019-08-03: 18:00:00 2 [IU] via SUBCUTANEOUS
  Administered 2019-08-03 (×2): 5 [IU] via SUBCUTANEOUS
  Administered 2019-08-04: 8 [IU] via SUBCUTANEOUS
  Administered 2019-08-04: 08:00:00 15 [IU] via SUBCUTANEOUS
  Administered 2019-08-04: 3 [IU] via SUBCUTANEOUS
  Administered 2019-08-05: 12:00:00 2 [IU] via SUBCUTANEOUS
  Administered 2019-08-05: 3 [IU] via SUBCUTANEOUS
  Administered 2019-08-06: 8 [IU] via SUBCUTANEOUS
  Administered 2019-08-06 – 2019-08-07 (×4): 3 [IU] via SUBCUTANEOUS
  Administered 2019-08-07: 08:00:00 5 [IU] via SUBCUTANEOUS
  Administered 2019-08-08: 2 [IU] via SUBCUTANEOUS
  Administered 2019-08-08: 5 [IU] via SUBCUTANEOUS
  Administered 2019-08-09: 2 [IU] via SUBCUTANEOUS
  Administered 2019-08-09: 12:00:00 3 [IU] via SUBCUTANEOUS
  Administered 2019-08-10: 5 [IU] via SUBCUTANEOUS
  Administered 2019-08-11 (×2): 3 [IU] via SUBCUTANEOUS
  Administered 2019-08-11: 8 [IU] via SUBCUTANEOUS
  Administered 2019-08-12: 3 [IU] via SUBCUTANEOUS
  Administered 2019-08-12: 2 [IU] via SUBCUTANEOUS
  Administered 2019-08-12 – 2019-08-13 (×4): 3 [IU] via SUBCUTANEOUS
  Administered 2019-08-14 (×2): 5 [IU] via SUBCUTANEOUS
  Administered 2019-08-15 (×3): 2 [IU] via SUBCUTANEOUS
  Administered 2019-08-16: 3 [IU] via SUBCUTANEOUS
  Administered 2019-08-17: 8 [IU] via SUBCUTANEOUS
  Administered 2019-08-17: 5 [IU] via SUBCUTANEOUS
  Administered 2019-08-18: 2 [IU] via SUBCUTANEOUS
  Administered 2019-08-18: 3 [IU] via SUBCUTANEOUS
  Administered 2019-08-19: 2 [IU] via SUBCUTANEOUS
  Administered 2019-08-19: 3 [IU] via SUBCUTANEOUS
  Administered 2019-08-20: 2 [IU] via SUBCUTANEOUS
  Administered 2019-08-20: 3 [IU] via SUBCUTANEOUS
  Administered 2019-08-21: 2 [IU] via SUBCUTANEOUS
  Administered 2019-08-21: 3 [IU] via SUBCUTANEOUS
  Administered 2019-08-21: 1 [IU] via SUBCUTANEOUS
  Administered 2019-08-22: 2 [IU] via SUBCUTANEOUS
  Administered 2019-08-22: 3 [IU] via SUBCUTANEOUS
  Administered 2019-08-22: 5 [IU] via SUBCUTANEOUS
  Administered 2019-08-23: 07:00:00 2 [IU] via SUBCUTANEOUS
  Administered 2019-08-23: 3 [IU] via SUBCUTANEOUS
  Administered 2019-08-24: 2 [IU] via SUBCUTANEOUS
  Administered 2019-08-24: 5 [IU] via SUBCUTANEOUS
  Administered 2019-08-25: 2 [IU] via SUBCUTANEOUS
  Administered 2019-08-26: 3 [IU] via SUBCUTANEOUS
  Administered 2019-08-27 (×2): 2 [IU] via SUBCUTANEOUS
  Administered 2019-08-27 – 2019-08-28 (×2): 3 [IU] via SUBCUTANEOUS
  Administered 2019-08-28: 2 [IU] via SUBCUTANEOUS
  Administered 2019-08-29: 3 [IU] via SUBCUTANEOUS
  Administered 2019-08-29 – 2019-09-01 (×6): 2 [IU] via SUBCUTANEOUS
  Administered 2019-09-02: 13:00:00 5 [IU] via SUBCUTANEOUS
  Administered 2019-09-02 – 2019-09-04 (×4): 2 [IU] via SUBCUTANEOUS
  Administered 2019-09-05: 18:00:00 3 [IU] via SUBCUTANEOUS
  Administered 2019-09-06: 13:00:00 2 [IU] via SUBCUTANEOUS
  Administered 2019-09-07: 3 [IU] via SUBCUTANEOUS
  Administered 2019-09-08 – 2019-09-09 (×2): 2 [IU] via SUBCUTANEOUS
  Administered 2019-09-10: 5 [IU] via SUBCUTANEOUS
  Administered 2019-09-11: 12:00:00 3 [IU] via SUBCUTANEOUS
  Administered 2019-09-11: 2 [IU] via SUBCUTANEOUS
  Administered 2019-09-12: 3 [IU] via SUBCUTANEOUS

## 2019-08-01 MED ORDER — LIDOCAINE 2% (20 MG/ML) 5 ML SYRINGE
INTRAMUSCULAR | Status: DC | PRN
  Administered 2019-08-01: 100 mg via INTRAVENOUS
  Administered 2019-08-01: 50 mg via INTRAVENOUS

## 2019-08-01 MED ORDER — IOHEXOL 300 MG/ML  SOLN
75.0000 mL | Freq: Once | INTRAMUSCULAR | Status: AC | PRN
  Administered 2019-08-01: 03:00:00 75 mL via INTRAVENOUS

## 2019-08-01 MED ORDER — LIDOCAINE 2% (20 MG/ML) 5 ML SYRINGE
INTRAMUSCULAR | Status: AC
  Filled 2019-08-01: qty 5

## 2019-08-01 MED ORDER — MEPERIDINE HCL 25 MG/ML IJ SOLN: 6.2500 mg | INTRAMUSCULAR | Status: DC | PRN

## 2019-08-01 MED ORDER — DEXTROSE-NACL 5-0.45 % IV SOLN: INTRAVENOUS | Status: DC

## 2019-08-01 MED ORDER — MIDAZOLAM HCL 5 MG/5ML IJ SOLN
INTRAMUSCULAR | Status: DC | PRN
  Administered 2019-08-01: 2 mg via INTRAVENOUS

## 2019-08-01 MED ORDER — WHITE PETROLATUM EX OINT
TOPICAL_OINTMENT | CUTANEOUS | Status: AC
  Administered 2019-08-01: 0.2
  Filled 2019-08-01: qty 28.35

## 2019-08-01 MED ORDER — SODIUM CHLORIDE 0.9 % IV SOLN
2.0000 g | Freq: Once | INTRAVENOUS | Status: AC
  Administered 2019-08-01: 2 g via INTRAVENOUS
  Filled 2019-08-01: qty 2

## 2019-08-01 MED ORDER — FENTANYL CITRATE (PF) 100 MCG/2ML IJ SOLN
12.5000 ug | INTRAMUSCULAR | Status: DC | PRN
  Administered 2019-08-01: 100 ug via INTRAVENOUS
  Administered 2019-08-01 – 2019-08-02 (×2): 12.5 ug via INTRAVENOUS
  Filled 2019-08-01 (×2): qty 2

## 2019-08-01 MED ORDER — PHENYLEPHRINE 40 MCG/ML (10ML) SYRINGE FOR IV PUSH (FOR BLOOD PRESSURE SUPPORT)
PREFILLED_SYRINGE | INTRAVENOUS | Status: AC
  Filled 2019-08-01: qty 10

## 2019-08-01 MED ORDER — ACETAMINOPHEN 500 MG PO TABS
1000.0000 mg | ORAL_TABLET | Freq: Once | ORAL | Status: AC
  Administered 2019-08-01: 06:00:00 1000 mg via ORAL
  Filled 2019-08-01: qty 2

## 2019-08-01 MED ORDER — CEFAZOLIN SODIUM-DEXTROSE 2-4 GM/100ML-% IV SOLN
2.0000 g | INTRAVENOUS | Status: AC
  Administered 2019-08-01: 13:00:00 2 g via INTRAVENOUS
  Filled 2019-08-01: qty 100

## 2019-08-01 MED ORDER — SODIUM CHLORIDE 0.9 % IV SOLN
2.0000 g | Freq: Three times a day (TID) | INTRAVENOUS | Status: DC
  Administered 2019-08-02 (×3): 2 g via INTRAVENOUS
  Filled 2019-08-01 (×3): qty 2

## 2019-08-01 MED ORDER — INSULIN REGULAR(HUMAN) IN NACL 100-0.9 UT/100ML-% IV SOLN
INTRAVENOUS | Status: DC
  Administered 2019-08-01: 3.6 [IU]/h via INTRAVENOUS
  Filled 2019-08-01: qty 100

## 2019-08-01 MED ORDER — METRONIDAZOLE IN NACL 5-0.79 MG/ML-% IV SOLN
500.0000 mg | Freq: Once | INTRAVENOUS | Status: AC
  Administered 2019-08-01: 02:00:00 500 mg via INTRAVENOUS
  Filled 2019-08-01: qty 100

## 2019-08-01 SURGICAL SUPPLY — 71 items
APL SKNCLS STERI-STRIP NONHPOA (GAUZE/BANDAGES/DRESSINGS) ×4
ATTRACTOMAT 16X20 MAGNETIC DRP (DRAPES) ×1 IMPLANT
BAG DECANTER FOR FLEXI CONT (MISCELLANEOUS) IMPLANT
BENZOIN TINCTURE PRP APPL 2/3 (GAUZE/BANDAGES/DRESSINGS) ×6 IMPLANT
BLADE CLIPPER SURG (BLADE) IMPLANT
BLADE SURG 10 STRL SS (BLADE) ×4 IMPLANT
BNDG GAUZE ELAST 4 BULKY (GAUZE/BANDAGES/DRESSINGS) IMPLANT
BRUSH SCRUB EZ PLAIN DRY (MISCELLANEOUS) ×3 IMPLANT
CANISTER SUCT 3000ML PPV (MISCELLANEOUS) ×3 IMPLANT
CATH THORACIC 28FR RT ANG (CATHETERS) IMPLANT
CATH THORACIC 36FR (CATHETERS) IMPLANT
CATH THORACIC 36FR RT ANG (CATHETERS) IMPLANT
CLIP VESOCCLUDE SM WIDE 24/CT (CLIP) IMPLANT
CONN Y 3/8X3/8X3/8  BEN (MISCELLANEOUS)
CONN Y 3/8X3/8X3/8 BEN (MISCELLANEOUS) ×1 IMPLANT
CONT SPEC 4OZ CLIKSEAL STRL BL (MISCELLANEOUS) IMPLANT
COVER SURGICAL LIGHT HANDLE (MISCELLANEOUS) ×5 IMPLANT
COVER WAND RF STERILE (DRAPES) IMPLANT
DRAPE LAPAROSCOPIC ABDOMINAL (DRAPES) ×3 IMPLANT
DRAPE WARM FLUID 44X44 (DRAPES) IMPLANT
DRSG AQUACEL AG ADV 3.5X14 (GAUZE/BANDAGES/DRESSINGS) ×1 IMPLANT
DRSG PAD ABDOMINAL 8X10 ST (GAUZE/BANDAGES/DRESSINGS) IMPLANT
DRSG VAC ATS SM SENSATRAC (GAUZE/BANDAGES/DRESSINGS) ×2 IMPLANT
ELECT BLADE 4.0 EZ CLEAN MEGAD (MISCELLANEOUS) ×3
ELECT REM PT RETURN 9FT ADLT (ELECTROSURGICAL) ×3
ELECTRODE BLDE 4.0 EZ CLN MEGD (MISCELLANEOUS) ×1 IMPLANT
ELECTRODE REM PT RTRN 9FT ADLT (ELECTROSURGICAL) ×2 IMPLANT
GAUZE SPONGE 4X4 12PLY STRL (GAUZE/BANDAGES/DRESSINGS) ×3 IMPLANT
GAUZE XEROFORM 5X9 LF (GAUZE/BANDAGES/DRESSINGS) IMPLANT
GLOVE BIO SURGEON STRL SZ 6.5 (GLOVE) ×6 IMPLANT
GLOVE BIOGEL PI IND STRL 6 (GLOVE) ×2 IMPLANT
GLOVE BIOGEL PI IND STRL 6.5 (GLOVE) IMPLANT
GLOVE BIOGEL PI IND STRL 7.0 (GLOVE) IMPLANT
GLOVE BIOGEL PI IND STRL 7.5 (GLOVE) ×1 IMPLANT
GLOVE BIOGEL PI INDICATOR 6 (GLOVE) ×2
GLOVE BIOGEL PI INDICATOR 6.5 (GLOVE) ×2
GLOVE BIOGEL PI INDICATOR 7.0 (GLOVE) ×1
GLOVE BIOGEL PI INDICATOR 7.5 (GLOVE) ×1
GOWN STRL REUS W/ TWL LRG LVL3 (GOWN DISPOSABLE) ×8 IMPLANT
GOWN STRL REUS W/TWL LRG LVL3 (GOWN DISPOSABLE) ×9
HANDPIECE INTERPULSE COAX TIP (DISPOSABLE)
HEMOSTAT POWDER SURGIFOAM 1G (HEMOSTASIS) IMPLANT
HEMOSTAT SURGICEL 2X14 (HEMOSTASIS) IMPLANT
KIT BASIN OR (CUSTOM PROCEDURE TRAY) ×3 IMPLANT
KIT SUCTION CATH 14FR (SUCTIONS) IMPLANT
KIT TURNOVER KIT B (KITS) ×3 IMPLANT
MARKER SKIN DUAL TIP RULER LAB (MISCELLANEOUS) IMPLANT
NS IRRIG 1000ML POUR BTL (IV SOLUTION) ×3 IMPLANT
PACK CHEST (CUSTOM PROCEDURE TRAY) ×1 IMPLANT
PAD ARMBOARD 7.5X6 YLW CONV (MISCELLANEOUS) ×6 IMPLANT
SET HNDPC FAN SPRY TIP SCT (DISPOSABLE) IMPLANT
SOL PREP POV-IOD 4OZ 10% (MISCELLANEOUS) ×2 IMPLANT
SOL PREP PROV IODINE SCRUB 4OZ (MISCELLANEOUS) ×3 IMPLANT
SPONGE LAP 18X18 RF (DISPOSABLE) ×3 IMPLANT
STAPLER VISISTAT 35W (STAPLE) IMPLANT
SUT ETHILON 3 0 FSL (SUTURE) IMPLANT
SUT STEEL 6MS V (SUTURE) IMPLANT
SUT STEEL STERNAL CCS#1 18IN (SUTURE) IMPLANT
SUT STEEL SZ 6 DBL 3X14 BALL (SUTURE) IMPLANT
SUT VIC AB 1 CTX 36 (SUTURE)
SUT VIC AB 1 CTX36XBRD ANBCTR (SUTURE) ×2 IMPLANT
SUT VIC AB 2-0 CTX 27 (SUTURE) ×4 IMPLANT
SUT VIC AB 3-0 X1 27 (SUTURE) ×2 IMPLANT
SWAB COLLECTION DEVICE MRSA (MISCELLANEOUS) ×1 IMPLANT
SWAB CULTURE ESWAB REG 1ML (MISCELLANEOUS) IMPLANT
SYR 5ML LL (SYRINGE) IMPLANT
SYSTEM SAHARA CHEST DRAIN ATS (WOUND CARE) ×1 IMPLANT
TOWEL GREEN STERILE (TOWEL DISPOSABLE) ×3 IMPLANT
TOWEL GREEN STERILE FF (TOWEL DISPOSABLE) ×1 IMPLANT
TRAY FOLEY SLVR 14FR TEMP STAT (SET/KITS/TRAYS/PACK) IMPLANT
WATER STERILE IRR 1000ML POUR (IV SOLUTION) ×3 IMPLANT

## 2019-08-01 NOTE — ED Notes (Signed)
Patient transported to MRI 

## 2019-08-01 NOTE — ED Notes (Signed)
Pt has several sores (abscess on heel of right heel, lump left clavicle). Pt legs have redness and appears slightly edematous.

## 2019-08-01 NOTE — Transfer of Care (Signed)
Immediate Anesthesia Transfer of Care Note  Patient: William [name]  Procedure(s) Performed: I&D Left Chest wall abscess with application of wound vac (Left ) TRANSESOPHAGEAL ECHOCARDIOGRAM (TEE) (N/A )  Patient Location: PACU  Anesthesia Type:General  Level of Consciousness: awake and alert   Airway & Oxygen Therapy: Patient Spontanous Breathing and Patient connected to nasal cannula oxygen  Post-op Assessment: Report given to RN and Post -op Vital signs reviewed and stable  Post vital signs: Reviewed and stable  Last Vitals:  Vitals Value Taken Time  BP 144/80 08/01/19 1416  Temp    Pulse 103 08/01/19 1417  Resp 15 08/01/19 1417  SpO2 99 % 08/01/19 1417  Vitals shown include unvalidated device data.  Last Pain:  Vitals:   08/01/19 0948  TempSrc:   PainSc: 8          Complications: No apparent anesthesia complications

## 2019-08-01 NOTE — Progress Notes (Signed)
  Echocardiogram Echocardiogram Transesophageal has been performed.  William [name] 08/01/2019, 2:25 PM

## 2019-08-01 NOTE — Progress Notes (Signed)
  Echocardiogram 2D Echocardiogram has been performed.  William [name] 08/01/2019, 8:43 AM

## 2019-08-01 NOTE — Anesthesia Preprocedure Evaluation (Signed)
Anesthesia Evaluation  Patient identified by MRN, date of birth, ID band Patient awake    Reviewed: Allergy & Precautions, NPO status , Patient's Chart, lab work & pertinent test results  Airway Mallampati: II  TM Distance: >3 FB Neck ROM: Full    Dental no notable dental hx. (+) Dental Advisory Given   Pulmonary pneumonia,    Pulmonary exam normal breath sounds clear to auscultation       Cardiovascular negative cardio ROS Normal cardiovascular exam Rhythm:Regular Rate:Normal     Neuro/Psych  Neuromuscular disease negative psych ROS   GI/Hepatic negative GI ROS, (+)     substance abuse  IV drug use,   Endo/Other  negative endocrine ROS  Renal/GU Renal disease     Musculoskeletal negative musculoskeletal ROS (+)   Abdominal   Peds  Hematology negative hematology ROS (+)   Anesthesia Other Findings   Reproductive/Obstetrics                             Anesthesia Physical Anesthesia Plan  ASA: II  Anesthesia Plan: General   Post-op Pain Management:    Induction: Intravenous  PONV Risk Score and Plan: 3 and Ondansetron, Dexamethasone, Midazolam and Treatment may vary due to age or medical condition  Airway Management Planned: Oral ETT  Additional Equipment: TEE  Intra-op Plan:   Post-operative Plan: Extubation in OR  Informed Consent: I have reviewed the patients History and Physical, chart, labs and discussed the procedure including the risks, benefits and alternatives for the proposed anesthesia with the patient or authorized representative who has indicated his/her understanding and acceptance.     Dental advisory given  Plan Discussed with: CRNA  Anesthesia Plan Comments:         Anesthesia Quick Evaluation

## 2019-08-01 NOTE — Brief Op Note (Addendum)
      [address]       [place] [address]             [phone]     08/01/2019  2:13 PM  PATIENT:  [place] April Ricke  38 y.o. male  PRE-OPERATIVE DIAGNOSIS:  chest wall abscess  POST-OPERATIVE DIAGNOSIS:  chest wall abscess  PROCEDURE:  Procedure(s): I&D Left Chest wall abscess with application of wound vac (Left) TRANSESOPHAGEAL ECHOCARDIOGRAM (TEE) (N/A)  SURGEON:  Surgeon(s) and Role:    * Grace Isaac, MD - Primary    * Lightfoot, Lucile Crater, MD - Assisting  ANESTHESIA:   general  EBL:  15 mL   BLOOD ADMINISTERED:none  DRAINS: Negative pressure wound dressing   LOCAL MEDICATIONS USED:  NONE  SPECIMEN:  No Specimen  DISPOSITION OF SPECIMEN:  N/A  COUNTS:  YES   DICTATION: .Dragon Dictation  PLAN OF CARE: Admit to inpatient   PATIENT DISPOSITION:  PACU - hemodynamically stable.   Delay start of Pharmacological VTE agent (>24hrs) due to surgical blood loss or risk of bleeding: yes

## 2019-08-01 NOTE — ED Provider Notes (Addendum)
TIME SEEN: 12:55 AM  CHIEF COMPLAINT: multiple complaints  HPI: Patient is a 38 year old male with known IV heroin abuse who presents to the emergency department with multiple complaints.  States that 4 days ago he started having lower back pain mostly on the right side.  No numbness, weakness or bowel or bladder incontinence.  States he was seen in urgent care and given a muscle relaxer and diclofenac.  States his urine became very dark so he stopped the diclofenac and then he spiked a fever.  States he went back to the ED on 8/23/2- and was given prednisone and tramadol.  He states since being on prednisone he has been very thirsty.  He does not have a known history of diabetes.  He reports he last used IV heroin just prior to arrival.  He states he has also had an abscess to the right medial foot without drainage.  He has had pain with movement of his left knee but no significant swelling or injury.  He also has swelling and pain over his left chest wall and complains of chest pain with shortness of breath.  No cough.  No COVID exposures.  He has had decreased appetite with nausea.  No vomiting or diarrhea.  ROS: See HPI Constitutional:  fever  Eyes: no drainage  ENT: no runny nose   Cardiovascular:   chest pain  Resp:  SOB  GI: no vomiting GU: no dysuria Integumentary: no rash  Allergy: no hives  Musculoskeletal: no leg swelling  Neurological: no slurred speech ROS otherwise negative  PAST MEDICAL HISTORY/PAST SURGICAL HISTORY:  History reviewed. No pertinent past medical history.  MEDICATIONS:  Prior to Admission medications   Medication Sig Start Date End Date Taking? Authorizing Provider  metaxalone (SKELAXIN) 800 MG tablet Take 1 tablet (800 mg total) by mouth 3 (three) times daily. 07/22/19   Lawyer, Cristal Deerhristopher, PA-C  predniSONE (DELTASONE) 50 MG tablet Take 1 tablet (50 mg total) by mouth daily. 07/22/19   Lawyer, Cristal Deerhristopher, PA-C  traMADol (ULTRAM) 50 MG tablet Take 1 tablet  (50 mg total) by mouth every 6 (six) hours as needed for severe pain. 07/22/19   Lawyer, Cristal Deerhristopher, PA-C    ALLERGIES:  No Known Allergies  SOCIAL HISTORY:  Social History   Tobacco Use  . Smoking status: Never Smoker  . Smokeless tobacco: Never Used  Substance Use Topics  . Alcohol use: Not Currently    FAMILY HISTORY: History reviewed. No pertinent family history.  EXAM: BP 136/85   Pulse (!) 113   Temp 100.2 F (37.9 C) (Oral)   Resp (!) 21   Ht 6' 2.5" (1.892 m)   Wt 88 kg   SpO2 97%   BMI 24.57 kg/m  CONSTITUTIONAL: Alert and oriented and responds appropriately to questions.  Chronically ill-appearing, appears ill but nontoxic HEAD: Normocephalic EYES: Conjunctivae clear, pupils appear equal, EOMI ENT: normal nose; moist mucous membranes NECK: Supple, no meningismus, no nuchal rigidity, no LAD  CARD: Regular and tachycardic; S1 and S2 appreciated; no murmurs, no clicks, no rubs, no gallops RESP: Normal chest excursion without splinting or tachypnea; breath sounds clear and equal bilaterally; no wheezes, no rhonchi, no rales, no hypoxia or respiratory distress, speaking full sentences CHEST: Patient has a mass just below the left clavicle that appears to be in the soft tissues that is soft, nonfluctuant, without induration.  There is no redness or warmth.  This mass is tender to palpation.  No crepitus. ABD/GI: Normal bowel sounds; non-distended;  soft, non-tender, no rebound, no guarding, no peritoneal signs, no hepatosplenomegaly BACK:  The back appears normal and is mildly tender to palpation over the very lower lumbar spine without step-off or deformity, there is no cervical or thoracic spine tenderness EXT: Patient is tender to palpation over the left knee and there is mild erythema anteriorly but no joint effusion and he has full range of motion in this joint.  Patient has a fluctuant blisterlike lesion with underlying purulence to the medial right foot without  drainage.  There is no surrounding redness or warmth of this area.  He has 2+ DP pulses.  Normal ROM in all joints; non-tender to palpation; no edema; normal capillary refill; no cyanosis, no calf tenderness or swelling;  compartments are soft, otherwise joints appear normal    SKIN: Normal color for age and race; warm; no rash NEURO: Moves all extremities equally, reports normal sensation diffusely, no saddle anesthesia, no clonus or hyperreflexia, normal speech PSYCH: The patient's mood and manner are appropriate. Grooming and personal hygiene are appropriate.  MEDICAL DECISION MAKING: Patient here with fever, chest pain, joint pain, polydipsia.  Patient has an abscess to the right foot that has been incised and drained and wound culture has been sent.  He denies injecting drugs into his lower extremities.  His left knee is tender to palpation without joint effusion.  This could be the beginning of septic arthritis but there is no effusion at this time for me to tap.  He has full range of motion in this leg.  No calf tenderness or swelling to suggest DVT.  Doubt compartment syndrome.  Patient also has a mass in his left chest wall.  Will obtain CT of his chest with IV contrast.  He is also complaining of lower back pain without focal neurologic deficits.  Will obtain MRI of the lumbar spine with IV contrast to evaluate for epidural abscess versus discitis and osteomyelitis.  Blood cultures have been ordered.  Patient receiving broad-spectrum antibiotics.  I am concerned for bacteremia in this IV drug abuser as well as endocarditis given he is complaining of chest pain.  His EKG shows no ischemic abnormality.  Patient will need admission to the hospital.  COVID swab is pending.  Patient is extremely hyperglycemic.  No known history of diabetes.  This could be secondary to infection but also recent prednisone use.  He is not in DKA today.  Will give IV fluids and start insulin drip.  ED PROGRESS: Patient's  vital signs and blood glucose continue to improve with antipyretics, IV fluids, IV insulin, IV antibiotics.  Continues to have a normal mental status without complaints of headache.  CT of his chest shows a left anterior chest wall soft tissue and intramuscular infection with heterogeneous fluid and stranding and patchy soft tissue air that extends into the thoracic cavity through the first second rib space with extrapleural air-fluid collection.  This inflammatory process is causing severe stenosis of the left subclavian vessel without venous filling defect.  There is also air in the soft tissues of the right axilla and could be due to IV placement but could be also from additional soft tissue infection.  He also has multiple small pulmonary nodules concerning for possible septic emboli.  Spine seen on CT of the chest shows no sign of discitis or osteomyelitis.  MRI of the lumbar spine is pending.  4:20 AM  Discussed patient's CT findings with Dr. Fredricka Bonine with general surgery.  She has reviewed imaging.  Appreciate her help.  She recommends CT surgery consultation.  Discussed with Dr. Servando Snare with CT surgery.  Appreciate his help.  He recommends patient be admitted to the critical care service to the ICU and he will see patient in consult.  Agrees with keeping the patient n.p.o as patient will likely need surgery in the morning.   4:28 AM  D/w Dr. James Ivanoff with E link.  CCM with see in ED.  Appreciate their help.   I reviewed all nursing notes, vitals, pertinent previous records, EKGs, lab and urine results, imaging (as available).   5:05 AM  Critical care to admit.  Appreciate their help.   6:15 AM  Radiology called that patient has significant L2 to sacral epidural abscess involving the paraspinal musculature.  Will discuss with neurosurgery on call.  Will update ICU team.   6:25 AM  Spoke with Mariana Single, PA on call for NSG.  They will see patient in consult.  Appreciate NSG help.    EKG  Interpretation  Date/Time:  Wednesday August 01 2019 01:04:07 EDT Ventricular Rate:  108 PR Interval:    QRS Duration: 85 QT Interval:  301 QTC Calculation: 404 R Axis:   84 Text Interpretation:  Sinus tachycardia ST elev, probable normal early repol pattern No old tracing to compare Confirmed by Ward, Cyril Mourning [phone]) on 08/01/2019 1:23:35 AM        CRITICAL CARE Performed by: Cyril Mourning Ward   Total critical care time: 75 minutes  Critical care time was exclusive of separately billable procedures and treating other patients.  Critical care was necessary to treat or prevent imminent or life-threatening deterioration.  Critical care was time spent personally by me on the following activities: development of treatment plan with patient and/or surrogate as well as nursing, discussions with consultants, evaluation of patient's response to treatment, examination of patient, obtaining history from patient or surrogate, ordering and performing treatments and interventions, ordering and review of laboratory studies, ordering and review of radiographic studies, pulse oximetry and re-evaluation of patient's condition.   INCISION AND DRAINAGE Performed by: Cyril Mourning Ward Consent: Verbal consent obtained. Risks and benefits: risks, benefits and alternatives were discussed Type: abscess  Body area: Right inner foot  Anesthesia: local infiltration  Incision was made with a scalpel.  Local anesthetic: lidocaine 2 % with epinephrine  Anesthetic total: 3 ml  Complexity: complex Patient could not tolerate blunt dissection.  Drainage: purulent  Drainage amount: Moderate  Packing material: Patient could not tolerate packing.  Patient tolerance: Patient could not tolerate procedure well secondary to pain.      Ward, Delice Bison, DO 08/01/19 0428    Ward, Delice Bison, DO 08/01/19 0508    Ward, Delice Bison, DO 08/01/19 [phone]

## 2019-08-01 NOTE — Consult Note (Addendum)
[address]       [place] [address]             [phone]        William [name] [facility] Medical Record #[id number] Date of Birth: 1981/05/21  Referring: Dr Baxter Hire Ward Primary Care: Patient, No Pcp Per Primary Cardiologist:No primary care provider on file.  Chief Complaint:    Chief Complaint  Patient presents with   Muscle Pain    History of Present Illness:    Mr.  William [name]  is a 38 year old male with history of IV drug use but none prior history chronic illnesses who presented to the emergency room late last night with multiple complaints including low back pain, left shoulder pain, right heel pain.  He gives a history of having been seen at an urgent care facility with these complaints earlier in the week and was treated with a muscle relaxant and diclofenac.  He later developed a fever so he returned to the urgent care facility and said he was prescribed prednisone.  He did not improve so he presented to the emergency room late last night.  Evaluation included chest x-ray that showed density over the left upper chest.  This prompted a CT scan of the chest that demonstrated significant left anterior chest wall soft tissue and intramuscular infection with extension both to the axilla and deep into the chest cavity.  He also had multiple pulmonary nodules that appear suspicious for septic emboli.  He also had an MRI of his lower back to evaluate the back pain.  This showed extensive posterior lateral and ventral epidural abscess formation from the level of L2-3 down to the sacrum.  There was extension into the adjacent muscle as well.  There is evidence of osteomyelitis at the L5-S1 joint.  An echocardiogram was ordered and is pending at this time.  Pertinent lab evaluation included lactic acid level elevated at 3.7, initial glucose at greater than 700, and EKG showing sinus tachycardia with no overt ischemic changes.  CT surgery was consulted for management  of the left chest wall abscess. Since admission, he has been initiated on broad-spectrum antibiotic therapy including vancomycin, cefepime and Flagyl.  He has been started on an insulin drip and his most recent glucose was 202 at 8:05 AM.  Current Activity/ Functional Status:    Zubrod Score: At the time of surgery this patients most appropriate activity status/level should be described as: []     0    Normal activity, no symptoms []     1    Restricted in physical strenuous activity but ambulatory, able to do out light work [x]     2    Ambulatory and capable of self care, unable to do work activities, up and about                 more than 50%  Of the time                            []     3    Only limited self care, in bed greater than 50% of waking hours []     4    Completely disabled, no self care, confined to bed or chair []     5    Moribund  History reviewed. No pertinent past medical history.  History reviewed.  He has had no prior surgery  Social History  Tobacco Use  Smoking Status Never Smoker  Smokeless Tobacco Never Used    Social History   Substance and Sexual Activity  Alcohol Use Not Currently     No Known Allergies  Current Facility-Administered Medications  Medication Dose Route Frequency Provider Last Rate Last Dose   0.9 %  sodium chloride infusion  1,000 mL Intravenous Continuous Ward, Kristen N, DO 125 mL/hr at 08/01/19 0800     ceFAZolin (ANCEF) IVPB 2g/100 mL premix  2 g Intravenous 30 min Pre-Op Delight OvensGerhardt, Chenille Toor B, MD       ceFEPIme (MAXIPIME) 2 g in sodium chloride 0.9 % 100 mL IVPB  2 g Intravenous Q8H Ledford, James L, RPH       Chlorhexidine Gluconate Cloth 2 % PADS 6 each  6 each Topical Daily Marcelle SmilingJeong, Seong-Joo, MD       dextrose 5 %-0.45 % sodium chloride infusion   Intravenous Continuous Leslye PeerByrum, Robert S, MD 125 mL/hr at 08/01/19 0819     heparin injection 5,000 Units  5,000 Units Subcutaneous Q8H Desai, Rahul P, PA-C       insulin  regular, human (MYXREDLIN) 100 units/ 100 mL infusion   Intravenous Continuous Desai, Rahul P, PA-C 5.3 mL/hr at 08/01/19 0800     ondansetron (ZOFRAN) injection 4 mg  4 mg Intravenous Once Ward, Kristen N, DO       vancomycin (VANCOCIN) IVPB 750 mg/150 ml premix  750 mg Intravenous Q8H Ledford, Llana AlimentJames L, RPH        Medications Prior to Admission  Medication Sig Dispense Refill Last Dose   diclofenac (CATAFLAM) 50 MG tablet Take 50 mg by mouth 2 (two) times daily.   07/31/2019 at Unknown time   metaxalone (SKELAXIN) 800 MG tablet Take 1 tablet (800 mg total) by mouth 3 (three) times daily. (Patient not taking: Reported on 08/01/2019) 21 tablet 0 Not Taking at Unknown time   predniSONE (DELTASONE) 50 MG tablet Take 1 tablet (50 mg total) by mouth daily. (Patient not taking: Reported on 08/01/2019) 5 tablet 0 Not Taking at Unknown time   traMADol (ULTRAM) 50 MG tablet Take 1 tablet (50 mg total) by mouth every 6 (six) hours as needed for severe pain. (Patient not taking: Reported on 08/01/2019) 15 tablet 0 Not Taking at Unknown time    History reviewed. No pertinent family history.   Review of Systems:        Cardiac Review of Systems: Y or  [    ]= no  Chest Pain [ x   ]  Resting SOB [   ] Exertional SOB  [  ]  Orthopnea [  ]   Pedal Edema [   ]    Palpitations [  ] Syncope  [  ]   Presyncope [   ]  General Review of Systems: [Y] = yes [  ]=no Constitional: recent weight change [  ]; anorexia [x  ]; fatigue [  ]; nausea [  ]; night sweats [ x ]; fever [  ]; or chills [ x ]                                                               Dental: Last Dentist visit:   Eye : blurred vision [  ];  diplopia [   ]; vision changes [  ];  Amaurosis fugax[  ]; Resp: cough [  ];  wheezing[  ];  hemoptysis[  ]; shortness of breath[  ]; paroxysmal nocturnal dyspnea[  ]; dyspnea on exertion[  ]; or orthopnea[  ];  GI:  gallstones[  ], vomiting[  ];  dysphagia[  ]; melena[  ];  hematochezia [  ]; heartburn[   ];   Hx of  Colonoscopy[  ]; GU: kidney stones [  ]; hematuria[  ];   dysuria [  ];  nocturia[  ];  history of     obstruction [  ]; urinary frequency [  ]             Skin: rash, swelling[  ];, hair loss[  ];  peripheral edema[  ];  or itching[  ]; Musculosketetal: myalgias[  ];  joint swelling[  ];  joint erythema[  ];  joint pain[x  ];  back pain[x  ];  Heme/Lymph: bruising[  ];  bleeding[  ];  anemia[  ];  Neuro: TIA[  ];  headaches[  ];  stroke[  ];  vertigo[  ];  seizures[  ];   paresthesias[  ];  difficulty walking[  ];  Psych:depression[  ]; anxiety[  ];  Endocrine: diabetes[  ];  thyroid dysfunction[  ];             Physical Exam: BP 132/76    Pulse (!) 101    Temp 100 F (37.8 C) (Oral)    Resp (!) 22    Ht 6\' 2"  (1.88 m)    Wt 84.8 kg    SpO2 94%    BMI 24.01 kg/m    General appearance: alert, cooperative, moderate distress and anxious Head: Normocephalic, without obvious abnormality, atraumatic Neck: no adenopathy, no carotid bruit, no JVD, supple, symmetrical, trachea midline and thyroid not enlarged, symmetric, no tenderness/mass/nodules Lymph nodes: Cervical, supraclavicular, and axillary nodes normal. Resp: Breath sounds are clear to auscultation.  There is obvious swelling over the left superior anterior chest wall extending from the clavicle down almost to the nipple line.  This area is exquisitely tender.  There is no evidence of skin breakdown or erythema. Cardio: Regular rhythm.  Is tachycardic with a heart rate 105 218. GI: soft, non-tender; bowel sounds normal; no masses,  no organomegaly Extremities: All extremities are well perfused with easily palpable, hyperdynamic pulses.  There is a 4 cm in diameter raised, fluctuant area overlying the right medial heel.  There is presently no drainage.  This is tender. Neurologic: Alert and oriented X 3, normal strength and tone. Normal symmetric reflexes. Normal coordination and gait  Diagnostic Studies & Laboratory data:      Recent Radiology Findings:   Ct Chest W Contrast  Result Date: 08/01/2019 CLINICAL DATA:  Fever and IV drug use. Left chest wall mass concerning for abscess. EXAM: CT CHEST WITH CONTRAST TECHNIQUE: Multidetector CT imaging of the chest was performed during intravenous contrast administration. CONTRAST:  75mL OMNIPAQUE IOHEXOL 300 MG/ML  SOLN COMPARISON:  Radiograph earlier this day. FINDINGS: Cardiovascular: Left upper extremity injection. Majority of the contrast remains within the left upper extremity venous structures with collaterals posteriorly. There is severe narrowing of the left subclavian vein secondary to inflammatory process, image 24 series 3. No obvious venous filling defects. Heart is normal in size. Thoracic aorta is normal in caliber. Cannot assess for dissection given phase of contrast bolus timing. No pericardial effusion. Incidental note  of stranding about subcutaneous vessel in the right upper arm suspicious for superficial thrombophlebitis. Mediastinum/Nodes: Increased number of multiple small mediastinal lymph nodes. No hilar adenopathy. No axillary adenopathy. Lungs/Pleura: Left chest wall soft tissue infection extends into the thoracic cavity with extrapleural air fluid collection. This does not appear to originate from the lung parenchyma. Two small subpleural nodules in the right lower lobe, largest 6 mm, series 4, image 78. Multiple additional small pulmonary nodules at the left lung apex and right upper lobe. No cavitation. Bandlike opacities in both lower lobes consistent with atelectasis. No pleural fluid. Upper Abdomen: Right perirenal stranding is partially included. Cyst in the upper left kidney. Musculoskeletal: Heterogeneous enlargement of the left pectoralis muscle with ill-defined edema, fluid, and air. No confluent focal drainable fluid collection in the subcutaneous tissues. Inflammatory process extends intrathoracic via the first-second intercostal space anteriorly where  there is a 3 cm rounded extrapleural air fluid collection. No osseous erosions of the adjacent ribs. There are foci of soft tissue air in the right axilla and adjacent to the proximal humerus in the region of the shoulder joint, but no definite intra-articular air. No evidence of discitis osteomyelitis in the spine. IMPRESSION: 1. Left anterior chest wall soft tissue and intramuscular infection with heterogeneous fluid/stranding and patchy soft tissue air. There is extension into the thoracic cavity anteriorly through the first-second rib space with extrapleural heterogeneous air-fluid collection measuring approximately 3 cm accounting for radiographic appearance. 2. Narrowing of the left brachiocephalic vein may be due to venous spasm versus mass effect from adjacent inflammatory process. No obvious filling defects. Contrast refluxes into left upper extremity collaterals. 3. Air in the soft tissues of the right axilla and adjacent to the right shoulder. While this can be seen in the setting of IV placement, given soft tissue findings of the left chest, additional soft tissue infection is suggested. 4. Findings suspicious for superficial thrombophlebitis in the right arm with stranding about a prominent subcutaneous vessel. 5. Multiple small pulmonary nodules within both lungs, likely infectious or inflammatory in a patient of this age. Septic emboli are considered, however no central cavitation, and the largest nodule is only 6 mm. 6. Nonspecific right perinephric edema is partially included in the upper abdomen. Electronically Signed   By: Keith Rake M.D.   On: 08/01/2019 04:04   Mr Lumbar Spine W Wo Contrast  Result Date: 08/01/2019 CLINICAL DATA:  Low back pain.  Fever.  IV drug abuse. EXAM: MRI LUMBAR SPINE WITHOUT AND WITH CONTRAST TECHNIQUE: Multiplanar and multiecho pulse sequences of the lumbar spine were obtained without and with intravenous contrast. CONTRAST:  8.5 mL Gadavist. COMPARISON:  None.  FINDINGS: Segmentation: 5 non rib-bearing lumbar type vertebral bodies are present. The lowest fully formed vertebral body is L5. Alignment: AP alignment is anatomic. No significant listhesis is present. Vertebrae: Edematous changes are present throughout the L5 vertebral body and in the superior aspect of the S1 vertebral body. There is abnormal enhancement. Abnormal signal and enhancement are present in the disc space is well. Findings are consistent with disc osteomyelitis. There is no collapse. Marrow signal and vertebral body heights are otherwise normal. Conus medullaris and cauda equina: Conus extends to the L1 level. Conus and cauda equina appear normal. Paraspinal and other soft tissues: Limited imaging the abdomen is unremarkable. There is no significant adenopathy. No solid organ lesions are present. A peripherally enhancing heterogeneous fluid collection is present in the right paraspinous soft tissues measuring 2.3 x 2.2 by at least  4.5 cm. This likely communicates with the right L4-5 facet. Disc levels: Loculated posterior peripherally enhancing fluid collection begins at the L2-3 level and extends inferiorly to L5. The largest components are at L2-3 and L3-4 on the right r this displaces the thecal sac anteriorly and to the left. Axial dimension is 1.4 x 1.2 cm at the L3-4 level. The collection is more posterior and central at the L2-3 level, measuring up to 12 mm. Abnormal signal and enhancement extends into the right foramen at L4-5. L1-2: Negative. L2-3: Enhancing fluid collections are present posteriorly on the right with right subarticular narrowing. L3-4: Right posterolateral and foraminal fluid collections enhancement creates some mass effect. L4-5: Right lateral recess enhancement extending into the right L4-5 foramen creates mass effect. The left foramen is patent. A mild broad-based disc protrusion is present with a far left annular tear. L5-S1: Abnormal signal and enhancement is present in  the disc. A far right heterogeneous fluid collection is contiguous with the other collections, measuring at least 1.7 x 1.4 cm. Posterior soft tissue enhancement extends into the musculature overlying the sacrum. IMPRESSION: 1. Extensive right posterolateral epidural abscess extending from the L2-3 level into the sacral regions. 2. Abnormal enhancement and mass effect extending into the foramina on the right at L4-5 and L5-S1. 3. Disc osteomyelitis at L5-S1 without significant collapse. 4. Ventral epidural abscess L5 through S1-2 5. Posterior soft tissue abscess in the right paraspinous musculature extending from L3-4 into the sacral levels. 6. Abnormal enhancement suggesting communicating abscess through the right L4-5 facet joint. Critical Value/emergent results were called by telephone at the time of interpretation on 08/01/2019 at 6:14 am to Dr. Rochele Raring , who verbally acknowledged these results. Electronically Signed   By: Marin Roberts M.D.   On: 08/01/2019 06:16   Dg Chest Portable 1 View  Result Date: 08/01/2019 CLINICAL DATA:  Fever and pain for 5 days EXAM: PORTABLE CHEST 1 VIEW COMPARISON:  None. FINDINGS: Irregular masslike opacity in the left upper lobe no features of edema, pneumothorax, or effusion. Pulmonary vascularity is normally distributed. The cardiomediastinal contours are unremarkable. No acute osseous or soft tissue abnormality. IMPRESSION: Masslike opacity is present in the left upper lobe. This while this could reflect pneumonia in the left upper lobe in the setting of fever, the smooth margins along the inferior aspect are suspicious. Recommend further evaluation with contrast-enhanced CT imaging. Electronically Signed   By: Kreg Shropshire M.D.   On: 08/01/2019 00:56     I have independently reviewed the above radiologic studies and discussed with the patient   Recent Lab Findings: Lab Results  Component Value Date   WBC 19.3 (H) 07/31/2019   HGB 9.5 (L) 08/01/2019    HCT 28.0 (L) 08/01/2019   PLT 347 07/31/2019   GLUCOSE 701 (HH) 07/31/2019   ALT 25 07/31/2019   AST 19 07/31/2019   NA 133 (L) 08/01/2019   K 4.1 08/01/2019   CL 79 (L) 07/31/2019   CREATININE 1.62 (H) 07/31/2019   BUN 53 (H) 07/31/2019   CO2 27 07/31/2019   INR 1.3 (H) 07/31/2019      Assessment / Plan:      38 year old male IV drug user with no history of chronic illnesses presents with sepsis and multiple soft tissue abscesses large chest wall abscess on the left, extensive lumbar epidural abscess with extension into the adjacent muscle, and a superficial abscess on his right heel.  He also has multiple pulmonary nodules that are suspicious for  septic emboli.  We have been asked to address the large left chest wall abscess that extends down into the left thoracic cavity on CT scan.  There is soft tissue gas both in the anterior chest wall as well as the left axilla.  Broad-spectrum antibiotics have been initiated.  His severe hyperglycemia has been managed with an insulin and has improved from 700 to 200.  The procedure has been explained to the patient in detail by me  and all his questions have been answered.  Plan to proceed to the operating room as soon as an OR suite is available.  We will also perform transesophageal echocardiography while under anesthesia in the OR to rule out endocarditis.  Mr. Phineas RealMabe expresses understanding of this plan of management and requests that we proceed.   The goals risks and alternatives of the planned surgical procedure Procedure(s): I&D Left Chest wall abscess (Left) TRANSESOPHAGEAL ECHOCARDIOGRAM (TEE) (N/A)  have been discussed with the patient in detail. The risks of the procedure including death, infection, stroke, myocardial infarction, bleeding, blood transfusion have all been discussed specifically.  I have quoted Balinda Quailsaylor L Angerer a 2 % of perioperative mortality and a complication rate as high as 50 %. The patient's questions have been  answered.Balinda Quailsaylor L Engelson is willing  to proceed with the planned procedure.  Delight OvensEdward B Taniela Feltus MD      [address] [place] [id number] Office [phone]   Beeper [phone]

## 2019-08-01 NOTE — Progress Notes (Signed)
Notified bedside nurse of need to order blood cultures.

## 2019-08-01 NOTE — ED Notes (Signed)
MRI keeps calling, call back when possible

## 2019-08-01 NOTE — Progress Notes (Addendum)
eLink Physician-Brief Progress Note Patient Name: William [name] DOB: 02-Apr-1981 MRN: [id number]   Date of Service  08/01/2019  HPI/Events of Note  37/M with IV heroin use, presenting with back pain, should pain, and had been seen at an outside facility for pain medications and steroids.  Pt found to have left upper chest wall mass. CT shows  left anterior chest wall soft tissue and intramuscular infection with extension into the thoracic cavity, air in soft tissues of right axilla adjacent to right shoulder, multiple small pulmonary nodules within both lungs suspicious for septic emboli.  MRI shows the following: Extensive right posterolateral epidural abscess extending from the L2-3 level into the sacral regions. 2. Abnormal enhancement and mass effect extending into the foramina on the right at L4-5 and L5-S1. 3. Disc osteomyelitis at L5-S1 without significant collapse. 4. Ventral epidural abscess L5 through S1-2 5. Posterior soft tissue abscess in the right paraspinous musculature extending from L3-4 into the sacral levels. 6. Abnormal enhancement suggesting communicating abscess through the right L4-5 facet joint.  Pt is awake and alert.  He is not in respiratory distress.  He is hemodynamically stable.  eICU Interventions  Continue empiric antibiotics. Complete work up for infective endocarditis.  CT surgery on board.  Neurosurgery also made aware of the consult and will see the patient.  Insulin for glucose control.  NPO. SCDs for DVT Prophylaxis.  No indication for GI prophylaxis. Follow up 2d echo, follow up cultures.      Intervention Category Evaluation Type: New Patient Evaluation  Elsie Lincoln 08/01/2019, 6:45 AM

## 2019-08-01 NOTE — Progress Notes (Signed)
Inpatient Diabetes Program Recommendations  AACE/ADA: New Consensus Statement on Inpatient Glycemic Control (2015)  Target Ranges:  Prepandial:   less than 140 mg/dL      Peak postprandial:   less than 180 mg/dL (1-2 hours)      Critically ill patients:  140 - 180 mg/dL   Results for ZAKARI, BATHE (MRN [id number]) as of 08/01/2019 09:21  Ref. Range 08/01/2019 01:02 08/01/2019 03:37 08/01/2019 04:36 08/01/2019 06:41 08/01/2019 08:05  Glucose-Capillary Latest Ref Range: 70 - 99 mg/dL 577 (HH) 416 (H)  IV Insulin Drip Started 395 (H)  IV Insulin Drip 324 (H)  IV Insulin Drip 202 (H)  IV Insulin Drip   Results for ALTUS, ZAINO (MRN [id number]) as of 08/01/2019 09:21  Ref. Range 07/31/2019 23:02  Glucose Latest Ref Range: 70 - 99 mg/dL 701 (HH)   Admit with: Sepsis due to left upper chest wall mass in setting of IV drug abuse/ Extensive lumbar epidural abscess with extension into the adjacent muscle/ Superficial abscess on right heel/ Multiple pulmonary nodules that are suspicious for septic emboli  History: Heroin Abuse  Current Orders: IV Insulin Drip  Given Prednisone at ED visit on 08/23 for his Symptoms     To OR today with Cardiothoracic Team today.  IV Insulin started at 4am today.  Hemoglobin A1c level pending.     --Will follow patient during hospitalization--  Wyn Quaker RN, MSN, CDE Diabetes Coordinator Inpatient Glycemic Control Team Team Pager: [phone] (8a-5p)

## 2019-08-01 NOTE — Progress Notes (Signed)
Pharmacy Antibiotic Note  William [name] is a 38 y.o. male admitted on 07/31/2019 with sepsis.  Pharmacy has been consulted for Vancomycin/Cefepime dosing. Abscess to right foot as possible source. WBC is elevated. Acute renal failure.   Plan: Vancomycin 750 mg IV q8h >>Estimated AUC: 520 Cefepime 2g IV q8h Trend WBC, temp, renal function  F/U infectious work-up Drug levels as indicated   Height: 6' 2.5" (189.2 cm) Weight: 194 lb (88 kg) IBW/kg (Calculated) : 83.35  Temp (24hrs), Avg:101.2 F (38.4 C), Min:100.2 F (37.9 C), Max:102.2 F (39 C)  Recent Labs  Lab 07/31/19 2302  WBC 19.3*  CREATININE 1.62*  LATICACIDVEN 3.7*    Estimated Creatinine Clearance: 72.9 mL/min (A) (by C-G formula based on SCr of 1.62 mg/dL (H)).    No Known Allergies  Narda Bonds, PharmD, BCPS Clinical Pharmacist Phone: [phone]

## 2019-08-01 NOTE — ED Notes (Signed)
ED TO INPATIENT HANDOFF REPORT  ED Nurse Name and Phone #: Maryruth Hancock RN [phone]  S Name/Age/Gender William [name] 38 y.o. male Room/Bed: 025C/025C  Code Status   Code Status: Full Code  Home/SNF/Other Home Patient oriented to: self, place, time and situation Is this baseline? Yes   Triage Complete: Triage complete  Chief Complaint Back Spasm, Joint Pain  Triage Note Pt reports he's been in and out of UC for the last few days for chronic joint pain and generalized muscle pain.also reports abscess to his Right foot. Reports he has not eaten in 5 days and has not felt himself   Allergies No Known Allergies  Level of Care/Admitting Diagnosis ED Disposition    ED Disposition Condition Comment   Admit  Hospital Area: [facility] [[id number]]  Level of Care: ICU [6]  Covid Evaluation: Confirmed COVID Negative  Diagnosis: Sepsis [facility]) [[id number]]  Admitting Physician: Marcelle Smiling [[id number]]  Attending Physician: PCCM, MD [phone]  Estimated length of stay: 3 - 4 days  Certification:: I certify this patient will need inpatient services for at least 2 midnights  PT Class (Do Not Modify): Inpatient [101]  PT Acc Code (Do Not Modify): Private [1]       B Medical/Surgery History History reviewed. No pertinent past medical history. History reviewed. No pertinent surgical history.   A IV Location/Drains/Wounds Patient Lines/Drains/Airways Status   Active Line/Drains/Airways    Name:   Placement date:   Placement time:   Site:   Days:   Peripheral IV 08/01/19 Left Forearm   08/01/19    0047    Forearm   less than 1   Peripheral IV 08/01/19 Right Antecubital   08/01/19    0119    Antecubital   less than 1          Intake/Output Last 24 hours  Intake/Output Summary (Last 24 hours) at 08/01/2019 0554 Last data filed at 08/01/2019 5621 Gross per 24 hour  Intake 1000 ml  Output 1350 ml  Net -350 ml    Labs/Imaging Results for orders placed or performed  during the hospital encounter of 07/31/19 (from the past 48 hour(s))  Urinalysis, Routine w reflex microscopic     Status: Abnormal   Collection Time: 07/31/19 10:58 PM  Result Value Ref Range   Color, Urine YELLOW YELLOW   APPearance CLEAR CLEAR   Specific Gravity, Urine 1.016 1.005 - 1.030   pH 5.0 5.0 - 8.0   Glucose, UA >=500 (A) NEGATIVE mg/dL   Hgb urine dipstick LARGE (A) NEGATIVE   Bilirubin Urine NEGATIVE NEGATIVE   Ketones, ur NEGATIVE NEGATIVE mg/dL   Protein, ur 30 (A) NEGATIVE mg/dL   Nitrite NEGATIVE NEGATIVE   Leukocytes,Ua NEGATIVE NEGATIVE   RBC / HPF 11-20 0 - 5 RBC/hpf   WBC, UA 6-10 0 - 5 WBC/hpf   Bacteria, UA FEW (A) NONE SEEN   Mucus PRESENT     Comment: Performed at [facility], [address], [place], [place] [address]  Rapid urine drug screen (hospital performed)     Status: Abnormal   Collection Time: 07/31/19 10:58 PM  Result Value Ref Range   Opiates POSITIVE (A) NONE DETECTED   Cocaine NONE DETECTED NONE DETECTED   Benzodiazepines NONE DETECTED NONE DETECTED   Amphetamines NONE DETECTED NONE DETECTED   Tetrahydrocannabinol NONE DETECTED NONE DETECTED   Barbiturates NONE DETECTED NONE DETECTED    Comment: (NOTE) DRUG SCREEN FOR MEDICAL PURPOSES ONLY.  IF  CONFIRMATION IS NEEDED FOR ANY PURPOSE, NOTIFY LAB WITHIN 5 DAYS. LOWEST DETECTABLE LIMITS FOR URINE DRUG SCREEN Drug Class                     Cutoff (ng/mL) Amphetamine and metabolites    1000 Barbiturate and metabolites    200 Benzodiazepine                 200 Tricyclics and metabolites     300 Opiates and metabolites        300 Cocaine and metabolites        300 THC                            50 Performed at [facility], [address] [place], [place] [address]   Lactic acid, plasma     Status: Abnormal   Collection Time: 07/31/19 11:02 PM  Result Value Ref Range   Lactic Acid, Venous 3.7 (HH) 0.5 - 1.9 mmol/L    Comment: CRITICAL RESULT CALLED TO, READ BACK BY AND  VERIFIED WITH: J.FERRINOLO,RN 2345 07/31/2019 M.CAMPBELL Performed at [facility] [place] [facility], [address] [place], [place] [address]   Comprehensive metabolic panel     Status: Abnormal   Collection Time: 07/31/19 11:02 PM  Result Value Ref Range   Sodium 120 (L) 135 - 145 mmol/L   Potassium 6.0 (H) 3.5 - 5.1 mmol/L   Chloride 79 (L) 98 - 111 mmol/L   CO2 27 22 - 32 mmol/L   Glucose, Bld 701 (HH) 70 - 99 mg/dL    Comment: CRITICAL RESULT CALLED TO, READ BACK BY AND VERIFIED WITH: J.FERRINOLO,RN 2346 07/31/2019 M.CAMPBELL    BUN 53 (H) 6 - 20 mg/dL   Creatinine, Ser 0.981.62 (H) 0.61 - 1.24 mg/dL   Calcium 8.4 (L) 8.9 - 10.3 mg/dL   Total Protein 6.9 6.5 - 8.1 g/dL   Albumin 1.7 (L) 3.5 - 5.0 g/dL   AST 19 15 - 41 U/L   ALT 25 0 - 44 U/L   Alkaline Phosphatase 276 (H) 38 - 126 U/L   Total Bilirubin 2.7 (H) 0.3 - 1.2 mg/dL   GFR calc non Af Amer 53 (L) >60 mL/min   GFR calc Af Amer >60 >60 mL/min   Anion gap 14 5 - 15    Comment: Performed at [facility], [address] [place], [place] [address]  CBC WITH DIFFERENTIAL     Status: Abnormal   Collection Time: 07/31/19 11:02 PM  Result Value Ref Range   WBC 19.3 (H) 4.0 - 10.5 K/uL   RBC 3.64 (L) 4.22 - 5.81 MIL/uL   Hemoglobin 11.1 (L) 13.0 - 17.0 g/dL   HCT 78.232.3 (L) 95.639.0 - 21.352.0 %   MCV 88.7 80.0 - 100.0 fL   MCH 30.5 26.0 - 34.0 pg   MCHC 34.4 30.0 - 36.0 g/dL   RDW 08.615.5 57.811.5 - 46.915.5 %   Platelets 347 150 - 400 K/uL   nRBC 0.0 0.0 - 0.2 %   Neutrophils Relative % 96 %   Neutro Abs 18.5 (H) 1.7 - 7.7 K/uL   Lymphocytes Relative 1 %   Lymphs Abs 0.2 (L) 0.7 - 4.0 K/uL   Monocytes Relative 3 %   Monocytes Absolute 0.6 0.1 - 1.0 K/uL   Eosinophils Relative 0 %   Eosinophils Absolute 0.0 0.0 - 0.5 K/uL   Basophils Relative 0 %  Basophils Absolute 0.0 0.0 - 0.1 K/uL   nRBC 1 (H) 0 /100 WBC   Abs Immature Granulocytes 0.00 0.00 - 0.07 K/uL    Comment: Performed at [facility], [address], [place],  [place] [address]  APTT     Status: None   Collection Time: 07/31/19 11:02 PM  Result Value Ref Range   aPTT 29 24 - 36 seconds    Comment: Performed at [facility], [address] [place], [place] [address]  Protime-INR     Status: Abnormal   Collection Time: 07/31/19 11:02 PM  Result Value Ref Range   Prothrombin Time 15.9 (H) 11.4 - 15.2 seconds   INR 1.3 (H) 0.8 - 1.2    Comment: (NOTE) INR goal varies based on device and disease states. Performed at [facility], [address], [place], [place] [address]   CK     Status: None   Collection Time: 07/31/19 11:02 PM  Result Value Ref Range   Total CK 71 49 - 397 U/L    Comment: Performed at [facility], [address], [place], [place] [address]  Lactic acid, plasma     Status: Abnormal   Collection Time: 08/01/19 12:58 AM  Result Value Ref Range   Lactic Acid, Venous 3.5 (HH) 0.5 - 1.9 mmol/L    Comment: CRITICAL RESULT CALLED TO, READ BACK BY AND VERIFIED WITH: J.Bethany Hirt,RN 0246 08/01/2019 M.CAMPBELL Performed at [facility], [address] [place], [place] [address]   CBG monitoring, ED     Status: Abnormal   Collection Time: 08/01/19  1:02 AM  Result Value Ref Range   Glucose-Capillary 577 (HH) 70 - 99 mg/dL   Comment 1 Notify RN   Ethanol     Status: None   Collection Time: 08/01/19  1:28 AM  Result Value Ref Range   Alcohol, Ethyl (B) <10 <10 mg/dL    Comment: (NOTE) Lowest detectable limit for serum alcohol is 10 mg/dL. For medical purposes only. Performed at [facility], [address] [place], [place] [address]   SARS Coronavirus 2 [facility] order, Performed in [facility] hospital lab) Nasopharyngeal Abscess     Status: None   Collection Time: 08/01/19  2:10 AM   Specimen: Abscess; Nasopharyngeal  Result Value Ref Range   SARS Coronavirus 2 NEGATIVE NEGATIVE    Comment: (NOTE) If result is NEGATIVE SARS-CoV-2 target nucleic acids are NOT DETECTED. The SARS-CoV-2 RNA is  generally detectable in upper and lower  respiratory specimens during the acute phase of infection. The lowest  concentration of SARS-CoV-2 viral copies this assay can detect is 250  copies / mL. A negative result does not preclude SARS-CoV-2 infection  and should not be used as the sole basis for treatment or other  patient management decisions.  A negative result may occur with  improper specimen collection / handling, submission of specimen other  than nasopharyngeal swab, presence of viral mutation(s) within the  areas targeted by this assay, and inadequate number of viral copies  (<250 copies / mL). A negative result must be combined with clinical  observations, patient history, and epidemiological information. If result is POSITIVE SARS-CoV-2 target nucleic acids are DETECTED. The SARS-CoV-2 RNA is generally detectable in upper and lower  respiratory specimens dur ing the acute phase of infection.  Positive  results are indicative of active infection with SARS-CoV-2.  Clinical  correlation with patient history and other diagnostic  information is  necessary to determine patient infection status.  Positive results do  not rule out bacterial infection or co-infection with other viruses. If result is PRESUMPTIVE POSTIVE SARS-CoV-2 nucleic acids MAY BE PRESENT.   A presumptive positive result was obtained on the submitted specimen  and confirmed on repeat testing.  While 2019 novel coronavirus  (SARS-CoV-2) nucleic acids may be present in the submitted sample  additional confirmatory testing may be necessary for epidemiological  and / or clinical management purposes  to differentiate between  SARS-CoV-2 and other Sarbecovirus currently known to infect humans.  If clinically indicated additional testing with an alternate test  methodology [phone]) is advised. The SARS-CoV-2 RNA is generally  detectable in upper and lower respiratory sp ecimens during the acute  phase of  infection. The expected result is Negative. Fact Sheet for Patients:  StrictlyIdeas.no Fact Sheet for Healthcare Providers: BankingDealers.co.za This test is not yet approved or cleared by the [place] FDA and has been authorized for detection and/or diagnosis of SARS-CoV-2 by FDA under an Emergency Use Authorization (EUA).  This EUA will remain in effect (meaning this test can be used) for the duration of the COVID-19 declaration under Section 564(b)(1) of the Act, 21 U.S.C. section 360bbb-3(b)(1), unless the authorization is terminated or revoked sooner. Performed at [facility], [address] [place], [place] [address]   CBG monitoring, ED     Status: Abnormal   Collection Time: 08/01/19  3:37 AM  Result Value Ref Range   Glucose-Capillary 416 (H) 70 - 99 mg/dL  Troponin I (High Sensitivity)     Status: Abnormal   Collection Time: 08/01/19  3:40 AM  Result Value Ref Range   Troponin I (High Sensitivity) 130 (HH) <18 ng/L    Comment: CRITICAL RESULT CALLED TO, READ BACK BY AND VERIFIED WITH: J.Makhiya Coburn,RN 9833 08/01/2019 M.CAMPBELL (NOTE) Elevated high sensitivity troponin I (hsTnI) values and significant  changes across serial measurements may suggest ACS but many other  chronic and acute conditions are known to elevate hsTnI results.  Refer to the Links section for chest pain algorithms and additional  guidance. Performed at [facility], [place] [address] [place],  [address]   CBG monitoring, ED     Status: Abnormal   Collection Time: 08/01/19  4:36 AM  Result Value Ref Range   Glucose-Capillary 395 (H) 70 - 99 mg/dL   Ct Chest W Contrast  Result Date: 08/01/2019 CLINICAL DATA:  Fever and IV drug use. Left chest wall mass concerning for abscess. EXAM: CT CHEST WITH CONTRAST TECHNIQUE: Multidetector CT imaging of the chest was performed during intravenous contrast administration. CONTRAST:  53mL  OMNIPAQUE IOHEXOL 300 MG/ML  SOLN COMPARISON:  Radiograph earlier this day. FINDINGS: Cardiovascular: Left upper extremity injection. Majority of the contrast remains within the left upper extremity venous structures with collaterals posteriorly. There is severe narrowing of the left subclavian vein secondary to inflammatory process, image 24 series 3. No obvious venous filling defects. Heart is normal in size. Thoracic aorta is normal in caliber. Cannot assess for dissection given phase of contrast bolus timing. No pericardial effusion. Incidental note of stranding about subcutaneous vessel in the right upper arm suspicious for superficial thrombophlebitis. Mediastinum/Nodes: Increased number of multiple small mediastinal lymph nodes. No hilar adenopathy. No axillary adenopathy. Lungs/Pleura: Left chest wall soft tissue infection extends into the thoracic cavity with extrapleural air fluid collection. This does not appear to originate from the lung parenchyma. Two small subpleural nodules  in the right lower lobe, largest 6 mm, series 4, image 78. Multiple additional small pulmonary nodules at the left lung apex and right upper lobe. No cavitation. Bandlike opacities in both lower lobes consistent with atelectasis. No pleural fluid. Upper Abdomen: Right perirenal stranding is partially included. Cyst in the upper left kidney. Musculoskeletal: Heterogeneous enlargement of the left pectoralis muscle with ill-defined edema, fluid, and air. No confluent focal drainable fluid collection in the subcutaneous tissues. Inflammatory process extends intrathoracic via the first-second intercostal space anteriorly where there is a 3 cm rounded extrapleural air fluid collection. No osseous erosions of the adjacent ribs. There are foci of soft tissue air in the right axilla and adjacent to the proximal humerus in the region of the shoulder joint, but no definite intra-articular air. No evidence of discitis osteomyelitis in the  spine. IMPRESSION: 1. Left anterior chest wall soft tissue and intramuscular infection with heterogeneous fluid/stranding and patchy soft tissue air. There is extension into the thoracic cavity anteriorly through the first-second rib space with extrapleural heterogeneous air-fluid collection measuring approximately 3 cm accounting for radiographic appearance. 2. Narrowing of the left brachiocephalic vein may be due to venous spasm versus mass effect from adjacent inflammatory process. No obvious filling defects. Contrast refluxes into left upper extremity collaterals. 3. Air in the soft tissues of the right axilla and adjacent to the right shoulder. While this can be seen in the setting of IV placement, given soft tissue findings of the left chest, additional soft tissue infection is suggested. 4. Findings suspicious for superficial thrombophlebitis in the right arm with stranding about a prominent subcutaneous vessel. 5. Multiple small pulmonary nodules within both lungs, likely infectious or inflammatory in a patient of this age. Septic emboli are considered, however no central cavitation, and the largest nodule is only 6 mm. 6. Nonspecific right perinephric edema is partially included in the upper abdomen. Electronically Signed   By: Narda RutherfordMelanie  Sanford M.D.   On: 08/01/2019 04:04   Dg Chest Portable 1 View  Result Date: 08/01/2019 CLINICAL DATA:  Fever and pain for 5 days EXAM: PORTABLE CHEST 1 VIEW COMPARISON:  None. FINDINGS: Irregular masslike opacity in the left upper lobe no features of edema, pneumothorax, or effusion. Pulmonary vascularity is normally distributed. The cardiomediastinal contours are unremarkable. No acute osseous or soft tissue abnormality. IMPRESSION: Masslike opacity is present in the left upper lobe. This while this could reflect pneumonia in the left upper lobe in the setting of fever, the smooth margins along the inferior aspect are suspicious. Recommend further evaluation with  contrast-enhanced CT imaging. Electronically Signed   By: Kreg ShropshirePrice  DeHay M.D.   On: 08/01/2019 00:56    Pending Labs Unresulted Labs (From admission, onward)    Start     Ordered   08/01/19 0513  Hemoglobin A1c  Once,   STAT     08/01/19 0512   08/01/19 0501  HIV antibody (Routine Testing)  Once,   STAT     08/01/19 0500   08/01/19 0501  HCV Ab Reflex to Quant PCR  Once,   STAT     08/01/19 0502   08/01/19 0500  CBC  Tomorrow morning,   R     08/01/19 0500   08/01/19 0500  Basic metabolic panel  Tomorrow morning,   R     08/01/19 0500   08/01/19 0500  Magnesium  Tomorrow morning,   R     08/01/19 0500   08/01/19 0500  Phosphorus  Tomorrow morning,  R     08/01/19 0500   08/01/19 0216  Blood culture (routine x 2)  BLOOD CULTURE X 2,   STAT     08/01/19 0215   08/01/19 0107  Wound or Superficial Culture  ONCE - STAT,   STAT     08/01/19 0106   07/31/19 2258  Urine culture  ONCE - STAT,   STAT     07/31/19 2258          Vitals/Pain Today's Vitals   08/01/19 0345 08/01/19 0400 08/01/19 0402 08/01/19 0415  BP: (!) 125/98 128/70  123/70  Pulse:  (!) 109  (!) 104  Resp: 13 (!) 22  (!) 27  Temp:      TempSrc:      SpO2:  95%  96%  Weight:    84.8 kg  Height:    6\' 2"  (1.88 m)  PainSc:   6      Isolation Precautions No active isolations  Medications Medications  0.9 %  sodium chloride infusion (1,000 mLs Intravenous New Bag/Given 08/01/19 0358)  ondansetron (ZOFRAN) injection 4 mg (has no administration in time range)  ceFEPIme (MAXIPIME) 2 g in sodium chloride 0.9 % 100 mL IVPB (has no administration in time range)  vancomycin (VANCOCIN) IVPB 750 mg/150 ml premix (has no administration in time range)  acetaminophen (TYLENOL) tablet 1,000 mg (has no administration in time range)  heparin injection 5,000 Units (has no administration in time range)  insulin regular, human (MYXREDLIN) 100 units/ 100 mL infusion (has no administration in time range)  acetaminophen (TYLENOL)  tablet 325 mg (325 mg Oral Given 08/01/19 0236)  sodium chloride 0.9 % bolus 1,000 mL (0 mLs Intravenous Stopped 08/01/19 0334)    And  sodium chloride 0.9 % bolus 1,000 mL (0 mLs Intravenous Stopped 08/01/19 0208)    And  sodium chloride 0.9 % bolus 1,000 mL (0 mLs Intravenous Stopped 08/01/19 0335)  ceFEPIme (MAXIPIME) 2 g in sodium chloride 0.9 % 100 mL IVPB (0 g Intravenous Stopped 08/01/19 0335)  metroNIDAZOLE (FLAGYL) IVPB 500 mg (0 mg Intravenous Stopped 08/01/19 0335)  vancomycin (VANCOCIN) IVPB 1000 mg/200 mL premix (0 mg Intravenous Stopped 08/01/19 0335)  fentaNYL (SUBLIMAZE) injection 50 mcg (50 mcg Intravenous Given 08/01/19 0229)  lidocaine (PF) (XYLOCAINE) 1 % injection 5 mL (5 mLs Intradermal Given 08/01/19 0207)  iohexol (OMNIPAQUE) 300 MG/ML solution 75 mL (75 mLs Intravenous Contrast Given 08/01/19 9528)    Mobility walks Low fall risk   Focused Assessments Cardiac Assessment Handoff:    Lab Results  Component Value Date   CKTOTAL 71 07/31/2019   No results found for: DDIMER Does the Patient currently have chest pain? No     R Recommendations: See Admitting Provider Note  Report given to:   Additional Notes: *alert and oriented at this time.**

## 2019-08-01 NOTE — H&P (Signed)
NAME:  William [name], MRN:  [id number], DOB:  1981-11-04, LOS: 0 ADMISSION DATE:  07/31/2019, CONSULTATION DATE:  08/01/19 REFERRING MD:  Ward  CHIEF COMPLAINT:  Pain   Brief History   William [name] is a 38 y.o. male who was admitted 9/2 with sepsis due to left upper chest wall mass in setting IV drug abuse.  History of present illness   William [name] is a 38 y.o. male who has a PMH including but not limited to heroin abuse.  He presented to [facility] ED 9/1 with multiple complaints including lower back pain, left shoulder pain, right foot / heal pain.  He was seen in UC and given a muscle relaxer and diclofenac.  Urine turned dark so he discontinued diclofenac.  He later spiked fever so returned to UC and was prescribed prednisone.  He continued to have symptoms so used heroin 9/1.  Later felt much worse so came to ED for further evaluation.  In ED, he was noted to have right heal abscess and left upper chest wall mass.  CXR showed mass in LUL.  CT noteable for left anterior chest wall soft tissue and intramuscular infection with extension into the thoracic cavity, air in soft tissues of right axilla adjacent to right shoulder, multiple small pulmonary nodules within both lungs suspicious for septic emboli given history.  CT surgery was consulted and recommended PCCM admission to ICU.  They will likely see in AM and take to OR.  Past Medical History  Heroin abuse.  Significant Hospital Events   9/2 > admit.  Consults:  TCTS.  Procedures:  None.  Significant Diagnostic Tests:  CT chest 9/2 > left anterior chest wall soft tissue and intramuscular infection with extension into the thoracic cavity, air in soft tissues of right axilla adjacent to right shoulder, multiple small pulmonary nodules within both lungs suspicious for septic emboli given history. MRI L spine 9/2 >  Echo 9/2 >   Micro Data:  Blood 9/2 >  SARS CoV2 9/2 > neg. R foot wound 9/2 >   Antimicrobials:  Vanc 9/2 >   Cefepime 9/2 >   Interim history/subjective:  Tired.  Complains of left upper chest wall pain, right shoulder pain.  Objective:  Blood pressure 123/70, pulse (!) 104, temperature 99.7 F (37.6 C), temperature source Oral, resp. rate (!) 27, height 6\' 2"  (1.88 m), weight 84.8 kg, SpO2 96 %.        Intake/Output Summary (Last 24 hours) at 08/01/2019 0502 Last data filed at 08/01/2019 3419 Gross per 24 hour  Intake 1000 ml  Output 1350 ml  Net -350 ml   Filed Weights   07/31/19 2254 08/01/19 0415  Weight: 88 kg 84.8 kg    Examination: General: Young caucasian male, in NAD. Neuro: A&O x 3, no deficits. HEENT: [place]/AT. Sclerae anicteric.  EOMI. Cardiovascular: RRR, no M/R/G.  Lungs: Respirations even and unlabored.  CTA bilaterally, No W/R/R.  Abdomen: BS x 4, soft, NT/ND.  Musculoskeletal: No gross deformities, no edema. Right upper arm and shoulder warm and tender to palpation.  Right heal abscess which has been I&D'd. Skin: Left upper chest wall mass that is tender to deep palpation.  Skin warm.  IVDU tract marks noted to bilateral hands / UE's.  Assessment & Plan:   Left anterior chest wall soft tissue and intramuscular infection with extension into the thoracic cavity, air in soft tissues of right axilla adjacent to right shoulder, multiple small pulmonary nodules within  both lungs suspicious for septic emboli given history of IVDU.  At risk endocarditis. - TCTS to see in AM for probable OR. - Empiric abx. - F/u on MRI to rule out discitis / epidural abscess. - Assess echo to r/o endocarditis.  Hyperglycemia - no underlying hx DM. - ICU hyperglycemia protocol. - Assess Hgb A1c.  Pseudohyponatremia - 2/2 hyperglycemia, corrects to ~ 130. Hyperkalemia - ? 2/2 AKI. AKI. - NS @ 125. - K should improve with insulin. - Follow BMP.   Best Practice:  Diet: NPO. Pain/Anxiety/Delirium protocol (if indicated): N/A. VAP protocol (if indicated): N/A. DVT prophylaxis: SCD's /  Heparin. GI prophylaxis: N/A. Glucose control: ICU hyperglycemia phase 2. Mobility: Bedrest. Code Status: Full. Family Communication: None available. Disposition: ICU.  Labs   CBC: Recent Labs  Lab 07/31/19 2302  WBC 19.3*  NEUTROABS 18.5*  HGB 11.1*  HCT 32.3*  MCV 88.7  PLT 347   Basic Metabolic Panel: Recent Labs  Lab 07/31/19 2302  NA 120*  K 6.0*  CL 79*  CO2 27  GLUCOSE 701*  BUN 53*  CREATININE 1.62*  CALCIUM 8.4*   GFR: Estimated Creatinine Clearance: 71.9 mL/min (A) (by C-G formula based on SCr of 1.62 mg/dL (H)). Recent Labs  Lab 07/31/19 2302 08/01/19 0058  WBC 19.3*  --   LATICACIDVEN 3.7* 3.5*   Liver Function Tests: Recent Labs  Lab 07/31/19 2302  AST 19  ALT 25  ALKPHOS 276*  BILITOT 2.7*  PROT 6.9  ALBUMIN 1.7*   No results for input(s): LIPASE, AMYLASE in the last 168 hours. No results for input(s): AMMONIA in the last 168 hours. ABG No results found for: PHART, PCO2ART, PO2ART, HCO3, TCO2, ACIDBASEDEF, O2SAT  Coagulation Profile: Recent Labs  Lab 07/31/19 2302  INR 1.3*   Cardiac Enzymes: Recent Labs  Lab 07/31/19 2302  CKTOTAL 71   HbA1C: No results found for: HGBA1C CBG: Recent Labs  Lab 08/01/19 0102 08/01/19 0337 08/01/19 0436  GLUCAP 577* 416* 395*    Review of Systems:   All negative; except for those that are bolded, which indicate positives.  Constitutional: weight loss, weight gain, night sweats, fevers, chills, fatigue, weakness.  HEENT: headaches, sore throat, sneezing, nasal congestion, post nasal drip, difficulty swallowing, tooth/dental problems, visual complaints, visual changes, ear aches. Neuro: difficulty with speech, weakness, numbness, ataxia. CV:  chest pain, orthopnea, PND, swelling in lower extremities, dizziness, palpitations, syncope.  Resp: cough, hemoptysis, dyspnea, wheezing. GI: heartburn, indigestion, abdominal pain, nausea, vomiting, diarrhea, constipation, change in bowel habits,  loss of appetite, hematemesis, melena, hematochezia.  GU: dysuria, change in color of urine, urgency or frequency, flank pain, hematuria. MSK: joint pain or swelling, decreased range of motion. Psych: change in mood or affect, depression, anxiety, suicidal ideations, homicidal ideations. Skin: rash, itching, bruising, left upper chest wall mass, right heal wound, right upper arm pain.   Past medical history  He,  has no past medical history on file.   Surgical History   History reviewed. No pertinent surgical history.   Social History   reports that he has never smoked. He has never used smokeless tobacco. He reports previous alcohol use. He reports current drug use.   Family history   His family history is not on file.   Allergies No Known Allergies   Home meds  Prior to Admission medications   Medication Sig Start Date End Date Taking? Authorizing Provider  diclofenac (CATAFLAM) 50 MG tablet Take 50 mg by mouth 2 (two) times  daily. 07/26/19  Yes [provider]  metaxalone (SKELAXIN) 800 MG tablet Take 1 tablet (800 mg total) by mouth 3 (three) times daily. Patient not taking: Reported on 08/01/2019 07/22/19   Charlestine NightLawyer, Christopher, PA-C  predniSONE (DELTASONE) 50 MG tablet Take 1 tablet (50 mg total) by mouth daily. Patient not taking: Reported on 08/01/2019 07/22/19   Charlestine NightLawyer, Christopher, PA-C  traMADol (ULTRAM) 50 MG tablet Take 1 tablet (50 mg total) by mouth every 6 (six) hours as needed for severe pain. Patient not taking: Reported on 08/01/2019 07/22/19   Charlestine NightLawyer, Christopher, PA-C     Rutherford Guysahul Anber Mckiver, PA - [facility] Pager: [phone].  If no answer, [phone] 08/01/2019, 5:02 AM

## 2019-08-01 NOTE — Progress Notes (Signed)
Pt requesting updates regarding his surgery/findings, if he can eat/drink, pain medicine. Paged MD Dr Lamonte Sakai and informed he wants updates. Also paged Dr Ninfa Linden but no answer. I will call OR. Will continue to follow up.   Thanks,  Dewaine Oats, RN

## 2019-08-01 NOTE — ED Notes (Signed)
Blood cultures were completed prior to the start of antibiotics.

## 2019-08-01 NOTE — Progress Notes (Signed)
Pt updated by both Dr. Lamonte Sakai and Dr. Servando Snare regarding his condition. Pt placed on regular diet and insulin gtt discontinued with AC/HS moderate coverage scale added. Will continue to monitor.   Dewaine Oats, RN

## 2019-08-01 NOTE — ED Notes (Signed)
Pt refused rectal temp, made RN aware.

## 2019-08-01 NOTE — Progress Notes (Signed)
PHARMACY - PHYSICIAN COMMUNICATION CRITICAL VALUE ALERT - BLOOD CULTURE IDENTIFICATION (BCID)  William [name] is an 38 y.o. male who presented to [place] on 07/31/2019 with a chief complaint of abscess  Assessment:  39 yo male with left upper wall chest abscess.  Now growing MSSA in 2/4 blood cultures (both aerobic bottles).  Name of physician (or Provider) Contacted: [place]  Current antibiotics: Vancomycin and Cefepime  Changes to prescribed antibiotics recommended:  Continue current antibiotics for now, can consider narrowing once abscess culture finalized.  Results for orders placed or performed during the hospital encounter of 07/31/19  Blood Culture ID Panel (Reflexed) (Collected: 08/01/2019  1:36 AM)  Result Value Ref Range   Enterococcus species NOT DETECTED NOT DETECTED   Listeria monocytogenes NOT DETECTED NOT DETECTED   Staphylococcus species DETECTED (A) NOT DETECTED   Staphylococcus aureus (BCID) DETECTED (A) NOT DETECTED   Methicillin resistance NOT DETECTED NOT DETECTED   Streptococcus species NOT DETECTED NOT DETECTED   Streptococcus agalactiae NOT DETECTED NOT DETECTED   Streptococcus pneumoniae NOT DETECTED NOT DETECTED   Streptococcus pyogenes NOT DETECTED NOT DETECTED   Acinetobacter baumannii NOT DETECTED NOT DETECTED   Enterobacteriaceae species NOT DETECTED NOT DETECTED   Enterobacter cloacae complex NOT DETECTED NOT DETECTED   Escherichia coli NOT DETECTED NOT DETECTED   Klebsiella oxytoca NOT DETECTED NOT DETECTED   Klebsiella pneumoniae NOT DETECTED NOT DETECTED   Proteus species NOT DETECTED NOT DETECTED   Serratia marcescens NOT DETECTED NOT DETECTED   Haemophilus influenzae NOT DETECTED NOT DETECTED   Neisseria meningitidis NOT DETECTED NOT DETECTED   Pseudomonas aeruginosa NOT DETECTED NOT DETECTED   Candida albicans NOT DETECTED NOT DETECTED   Candida glabrata NOT DETECTED NOT DETECTED   Candida krusei NOT DETECTED NOT DETECTED   Candida  parapsilosis NOT DETECTED NOT DETECTED   Candida tropicalis NOT DETECTED NOT DETECTED    .jc

## 2019-08-01 NOTE — Consult Note (Signed)
Chief Complaint   Chief Complaint  Patient presents with  . Muscle Pain    HPI   Consult requested by: Dr Elesa Massed, EDP Reason for consult: Lumbar epidural abscess, osteomyelitis/discitis  HPI: William [name] is a 38 y.o. male with history of IV drug abuse (heroin) who presented to ED with multiple complaints including LBP, left shoulder pain, right heal pain. He presented to urgent care where he was rx diclofenac and prednisone 10 days ago. Symptoms continued to progress so used heroin (1 hour prior to arrival to ED). In ED he was found to have a right heal abscess and left check wall mass. CT chest ordered by EDP revealed left anterior chest wall soft tissue and intramuscular infection with extension into the thoracic cavity, air in soft tissues of right axilla adjacent to right shoulder, multiple small pulmonary nodules within both lungs suspicious for septic emboli given history of IVDU. MRI Lumbar spine also obtained due to pain back revealing epidural abscess extending from L2-3 to S1-2 with paraspinal extension. NSY consultation requested for lumbar epidural abscess.   Complains of mild lower back discomfort. Denies radicular symptoms, weakness in legs, bowel/bladder dysfunction. No difficulties with gait.  Patient Active Problem List   Diagnosis Date Noted  . Sepsis (HCC) 08/01/2019  . Abscess of paraspinous muscles 08/01/2019  . Osteomyelitis of lumbar spine (HCC) 08/01/2019  . Epidural abscess of spine due to infective embolism 08/01/2019  . Abnormal chest CT 08/01/2019  . Septic embolism (HCC) 08/01/2019  . Community acquired pneumonia 08/01/2019  . IV drug abuse (HCC) 08/01/2019  . Heroin abuse (HCC) 08/01/2019  . Elevated troponin 08/01/2019  . UTI (urinary tract infection) 08/01/2019  . Renal failure 08/01/2019  . Hyperglycemia 08/01/2019  . Suspected endocarditis 08/01/2019    PMH: History reviewed. No pertinent past medical history.  PSH: History reviewed. No  pertinent surgical history.  Medications Prior to Admission  Medication Sig Dispense Refill Last Dose  . diclofenac (CATAFLAM) 50 MG tablet Take 50 mg by mouth 2 (two) times daily.   07/31/2019 at Unknown time  . metaxalone (SKELAXIN) 800 MG tablet Take 1 tablet (800 mg total) by mouth 3 (three) times daily. (Patient not taking: Reported on 08/01/2019) 21 tablet 0 Not Taking at Unknown time  . predniSONE (DELTASONE) 50 MG tablet Take 1 tablet (50 mg total) by mouth daily. (Patient not taking: Reported on 08/01/2019) 5 tablet 0 Not Taking at Unknown time  . traMADol (ULTRAM) 50 MG tablet Take 1 tablet (50 mg total) by mouth every 6 (six) hours as needed for severe pain. (Patient not taking: Reported on 08/01/2019) 15 tablet 0 Not Taking at Unknown time    SH: Social History   Tobacco Use  . Smoking status: Never Smoker  . Smokeless tobacco: Never Used  Substance Use Topics  . Alcohol use: Not Currently  . Drug use: Yes    Comment: opiates     MEDS: Prior to Admission medications   Medication Sig Start Date End Date Taking? Authorizing Provider  diclofenac (CATAFLAM) 50 MG tablet Take 50 mg by mouth 2 (two) times daily. 07/26/19  Yes [provider]  metaxalone (SKELAXIN) 800 MG tablet Take 1 tablet (800 mg total) by mouth 3 (three) times daily. Patient not taking: Reported on 08/01/2019 07/22/19   Charlestine Night, PA-C  predniSONE (DELTASONE) 50 MG tablet Take 1 tablet (50 mg total) by mouth daily. Patient not taking: Reported on 08/01/2019 07/22/19   Charlestine Night, PA-C  traMADol (  ULTRAM) 50 MG tablet Take 1 tablet (50 mg total) by mouth every 6 (six) hours as needed for severe pain. Patient not taking: Reported on 08/01/2019 07/22/19   Charlestine Night, PA-C    ALLERGY: No Known Allergies  Social History   Tobacco Use  . Smoking status: Never Smoker  . Smokeless tobacco: Never Used  Substance Use Topics  . Alcohol use: Not Currently     History reviewed. No  pertinent family history.   ROS   Review of Systems  Constitutional: Positive for fever and malaise/fatigue.  HENT: Negative for hearing loss and tinnitus.   Eyes: Negative for blurred vision, double vision and photophobia.  Gastrointestinal: Negative for nausea and vomiting.  Musculoskeletal: Positive for back pain and myalgias. Negative for falls and neck pain.  Neurological: Negative for dizziness, tingling, tremors, sensory change, speech change, focal weakness, seizures, loss of consciousness, weakness and headaches.    Exam   Vitals:   08/01/19 0400 08/01/19 0415  BP: 128/70 123/70  Pulse: (!) 109 (!) 104  Resp: (!) 22 (!) 27  Temp:    SpO2: 95% 96%   General appearance: WDWN, NAD Eyes: No scleral injection Musculoskeletal:     Muscle tone upper extremities: Normal    Muscle tone lower extremities: Normal    Motor exam: Upper Extremities Deltoid Bicep Tricep Grip  Right 5/5 5/5 5/5 5/5  Left 5/5 5/5 5/5 5/5   Lower Extremity IP Quad PF DF EHL  Right 5/5 5/5 4+/5 4+/5 5/5  Left 5/5 5/5 5/5 5/5 5/5  Movement of distal RLE causes significant heal pain where there is an ulcer noted.  Neurological Mental Status:    - Patient is awake, alert, oriented to person, place, month, year, and situation    - Patient is able to give a clear and coherent history.    - No signs of aphasia or neglect Cranial Nerves    - II: Visual Fields are full. PERRL    - III/IV/VI: EOMI without ptosis or diploplia.     - V: Facial sensation is grossly normal    - VII: Facial movement is symmetric.     - VIII: hearing is intact to voice    - X: Uvula elevates symmetrically    - XI: Shoulder shrug is symmetric.    - XII: tongue is midline without atrophy or fasciculations.  Sensory: Sensation grossly intact to LT  Results - Imaging/Labs   Results for orders placed or performed during the hospital encounter of 07/31/19 (from the past 48 hour(s))  Urinalysis, Routine w reflex microscopic      Status: Abnormal   Collection Time: 07/31/19 10:58 PM  Result Value Ref Range   Color, Urine YELLOW YELLOW   APPearance CLEAR CLEAR   Specific Gravity, Urine 1.016 1.005 - 1.030   pH 5.0 5.0 - 8.0   Glucose, UA >=500 (A) NEGATIVE mg/dL   Hgb urine dipstick LARGE (A) NEGATIVE   Bilirubin Urine NEGATIVE NEGATIVE   Ketones, ur NEGATIVE NEGATIVE mg/dL   Protein, ur 30 (A) NEGATIVE mg/dL   Nitrite NEGATIVE NEGATIVE   Leukocytes,Ua NEGATIVE NEGATIVE   RBC / HPF 11-20 0 - 5 RBC/hpf   WBC, UA 6-10 0 - 5 WBC/hpf   Bacteria, UA FEW (A) NONE SEEN   Mucus PRESENT     Comment: Performed at [facility], [address], [place], [place] [address]  Rapid urine drug screen (hospital performed)     Status: Abnormal  Collection Time: 07/31/19 10:58 PM  Result Value Ref Range   Opiates POSITIVE (A) NONE DETECTED   Cocaine NONE DETECTED NONE DETECTED   Benzodiazepines NONE DETECTED NONE DETECTED   Amphetamines NONE DETECTED NONE DETECTED   Tetrahydrocannabinol NONE DETECTED NONE DETECTED   Barbiturates NONE DETECTED NONE DETECTED    Comment: (NOTE) DRUG SCREEN FOR MEDICAL PURPOSES ONLY.  IF CONFIRMATION IS NEEDED FOR ANY PURPOSE, NOTIFY LAB WITHIN 5 DAYS. LOWEST DETECTABLE LIMITS FOR URINE DRUG SCREEN Drug Class                     Cutoff (ng/mL) Amphetamine and metabolites    1000 Barbiturate and metabolites    200 Benzodiazepine                 200 Tricyclics and metabolites     300 Opiates and metabolites        300 Cocaine and metabolites        300 THC                            50 Performed at [facility], [address] [place], [place] [address]   Lactic acid, plasma     Status: Abnormal   Collection Time: 07/31/19 11:02 PM  Result Value Ref Range   Lactic Acid, Venous 3.7 (HH) 0.5 - 1.9 mmol/L    Comment: CRITICAL RESULT CALLED TO, READ BACK BY AND VERIFIED WITH: J.FERRINOLO,RN [id number] 07/31/2019 M.CAMPBELL Performed at [facility], [address]  [place], [place] [address]   Comprehensive metabolic panel     Status: Abnormal   Collection Time: 07/31/19 11:02 PM  Result Value Ref Range   Sodium 120 (L) 135 - 145 mmol/L   Potassium 6.0 (H) 3.5 - 5.1 mmol/L   Chloride 79 (L) 98 - 111 mmol/L   CO2 27 22 - 32 mmol/L   Glucose, Bld 701 (HH) 70 - 99 mg/dL    Comment: CRITICAL RESULT CALLED TO, READ BACK BY AND VERIFIED WITH: J.FERRINOLO,RN [id number] 07/31/2019 M.CAMPBELL    BUN 53 (H) 6 - 20 mg/dL   Creatinine, Ser 0.98 (H) 0.61 - 1.24 mg/dL   Calcium 8.4 (L) 8.9 - 10.3 mg/dL   Total Protein 6.9 6.5 - 8.1 g/dL   Albumin 1.7 (L) 3.5 - 5.0 g/dL   AST 19 15 - 41 U/L   ALT 25 0 - 44 U/L   Alkaline Phosphatase 276 (H) 38 - 126 U/L   Total Bilirubin 2.7 (H) 0.3 - 1.2 mg/dL   GFR calc non Af Amer 53 (L) >60 mL/min   GFR calc Af Amer >60 >60 mL/min   Anion gap 14 5 - 15    Comment: Performed at [facility], [address] [place], [place] [address]  CBC WITH DIFFERENTIAL     Status: Abnormal   Collection Time: 07/31/19 11:02 PM  Result Value Ref Range   WBC 19.3 (H) 4.0 - 10.5 K/uL   RBC 3.64 (L) 4.22 - 5.81 MIL/uL   Hemoglobin 11.1 (L) 13.0 - 17.0 g/dL   HCT 78.2 (L) 95.6 - 21.3 %   MCV 88.7 80.0 - 100.0 fL   MCH 30.5 26.0 - 34.0 pg   MCHC 34.4 30.0 - 36.0 g/dL   RDW 08.6 57.8 - 46.9 %   Platelets 347 150 - 400 K/uL   nRBC 0.0 0.0 - 0.2 %   Neutrophils  Relative % 96 %   Neutro Abs 18.5 (H) 1.7 - 7.7 K/uL   Lymphocytes Relative 1 %   Lymphs Abs 0.2 (L) 0.7 - 4.0 K/uL   Monocytes Relative 3 %   Monocytes Absolute 0.6 0.1 - 1.0 K/uL   Eosinophils Relative 0 %   Eosinophils Absolute 0.0 0.0 - 0.5 K/uL   Basophils Relative 0 %   Basophils Absolute 0.0 0.0 - 0.1 K/uL   nRBC 1 (H) 0 /100 WBC   Abs Immature Granulocytes 0.00 0.00 - 0.07 K/uL    Comment: Performed at [place] [address], [place], [place] [address]  APTT     Status: None   Collection Time: 07/31/19 11:02 PM  Result Value Ref Range   aPTT 29 24 - 36  seconds    Comment: Performed at [place] [facility] [address], [place], [place] [address]  Protime-INR     Status: Abnormal   Collection Time: 07/31/19 11:02 PM  Result Value Ref Range   Prothrombin Time 15.9 (H) 11.4 - 15.2 seconds   INR 1.3 (H) 0.8 - 1.2    Comment: (NOTE) INR goal varies based on device and disease states. Performed at [facility], [place] [address] [place], [place] [address]   CK     Status: None   Collection Time: 07/31/19 11:02 PM  Result Value Ref Range   Total CK 71 49 - 397 U/L    Comment: Performed at [facility], [place] [address], [place], [place] [address]  Lactic acid, plasma     Status: Abnormal   Collection Time: 08/01/19 12:58 AM  Result Value Ref Range   Lactic Acid, Venous 3.5 (HH) 0.5 - 1.9 mmol/L    Comment: CRITICAL RESULT CALLED TO, READ BACK BY AND VERIFIED WITH: J.GLOSTER,RN 0246 08/01/2019 M.CAMPBELL Performed at [facility], [place] [address], [place], [place] [address]   CBG monitoring, ED     Status: Abnormal   Collection Time: 08/01/19  1:02 AM  Result Value Ref Range   Glucose-Capillary 577 (HH) 70 - 99 mg/dL   Comment 1 Notify RN   Ethanol     Status: None   Collection Time: 08/01/19  1:28 AM  Result Value Ref Range   Alcohol, Ethyl (B) <10 <10 mg/dL    Comment: (NOTE) Lowest detectable limit for serum alcohol is 10 mg/dL. For medical purposes only. Performed at [facility], [place] [address] [place], [place] [address]   SARS Coronavirus 2 [facility] order, Performed in [facility] hospital lab) Nasopharyngeal Abscess     Status: None   Collection Time: 08/01/19  2:10 AM   Specimen: Abscess; Nasopharyngeal  Result Value Ref Range   SARS Coronavirus 2 NEGATIVE NEGATIVE    Comment: (NOTE) If result is NEGATIVE SARS-CoV-2 target nucleic acids are NOT DETECTED. The SARS-CoV-2 RNA is generally detectable in upper and lower  respiratory specimens during the acute phase of infection. The lowest   concentration of SARS-CoV-2 viral copies this assay can detect is 250  copies / mL. A negative result does not preclude SARS-CoV-2 infection  and should not be used as the sole basis for treatment or other  patient management decisions.  A negative result may occur with  improper specimen collection / handling, submission of specimen other  than nasopharyngeal swab, presence of viral mutation(s) within the  areas targeted by this assay, and inadequate number of viral copies  (<250 copies /  mL). A negative result must be combined with clinical  observations, patient history, and epidemiological information. If result is POSITIVE SARS-CoV-2 target nucleic acids are DETECTED. The SARS-CoV-2 RNA is generally detectable in upper and lower  respiratory specimens dur ing the acute phase of infection.  Positive  results are indicative of active infection with SARS-CoV-2.  Clinical  correlation with patient history and other diagnostic information is  necessary to determine patient infection status.  Positive results do  not rule out bacterial infection or co-infection with other viruses. If result is PRESUMPTIVE POSTIVE SARS-CoV-2 nucleic acids MAY BE PRESENT.   A presumptive positive result was obtained on the submitted specimen  and confirmed on repeat testing.  While 2019 novel coronavirus  (SARS-CoV-2) nucleic acids may be present in the submitted sample  additional confirmatory testing may be necessary for epidemiological  and / or clinical management purposes  to differentiate between  SARS-CoV-2 and other Sarbecovirus currently known to infect humans.  If clinically indicated additional testing with an alternate test  methodology [phone]) is advised. The SARS-CoV-2 RNA is generally  detectable in upper and lower respiratory sp ecimens during the acute  phase of infection. The expected result is Negative. Fact Sheet for Patients:  BoilerBrush.com.cy Fact Sheet  for Healthcare Providers: https://pope.com/ This test is not yet approved or cleared by the [place] FDA and has been authorized for detection and/or diagnosis of SARS-CoV-2 by FDA under an Emergency Use Authorization (EUA).  This EUA will remain in effect (meaning this test can be used) for the duration of the COVID-19 declaration under Section 564(b)(1) of the Act, 21 U.S.C. section 360bbb-3(b)(1), unless the authorization is terminated or revoked sooner. Performed at [facility], [address], [place], [place] [address]   CBG monitoring, ED     Status: Abnormal   Collection Time: 08/01/19  3:37 AM  Result Value Ref Range   Glucose-Capillary 416 (H) 70 - 99 mg/dL  Troponin I (High Sensitivity)     Status: Abnormal   Collection Time: 08/01/19  3:40 AM  Result Value Ref Range   Troponin I (High Sensitivity) 130 (HH) <18 ng/L    Comment: CRITICAL RESULT CALLED TO, READ BACK BY AND VERIFIED WITH: J.GLOSTER,RN [id number] 08/01/2019 M.CAMPBELL (NOTE) Elevated high sensitivity troponin I (hsTnI) values and significant  changes across serial measurements may suggest ACS but many other  chronic and acute conditions are known to elevate hsTnI results.  Refer to the Links section for chest pain algorithms and additional  guidance. Performed at [facility], [address] [place], [place] [address]   CBG monitoring, ED     Status: Abnormal   Collection Time: 08/01/19  4:36 AM  Result Value Ref Range   Glucose-Capillary 395 (H) 70 - 99 mg/dL    Ct Chest W Contrast  Result Date: 08/01/2019 CLINICAL DATA:  Fever and IV drug use. Left chest wall mass concerning for abscess. EXAM: CT CHEST WITH CONTRAST TECHNIQUE: Multidetector CT imaging of the chest was performed during intravenous contrast administration. CONTRAST:  75mL OMNIPAQUE IOHEXOL 300 MG/ML  SOLN COMPARISON:  Radiograph earlier this day. FINDINGS: Cardiovascular: Left upper extremity  injection. Majority of the contrast remains within the left upper extremity venous structures with collaterals posteriorly. There is severe narrowing of the left subclavian vein secondary to inflammatory process, image 24 series 3. No obvious venous filling defects. Heart is normal in size. Thoracic aorta is normal in caliber. Cannot assess for dissection given phase of  contrast bolus timing. No pericardial effusion. Incidental note of stranding about subcutaneous vessel in the right upper arm suspicious for superficial thrombophlebitis. Mediastinum/Nodes: Increased number of multiple small mediastinal lymph nodes. No hilar adenopathy. No axillary adenopathy. Lungs/Pleura: Left chest wall soft tissue infection extends into the thoracic cavity with extrapleural air fluid collection. This does not appear to originate from the lung parenchyma. Two small subpleural nodules in the right lower lobe, largest 6 mm, series 4, image 78. Multiple additional small pulmonary nodules at the left lung apex and right upper lobe. No cavitation. Bandlike opacities in both lower lobes consistent with atelectasis. No pleural fluid. Upper Abdomen: Right perirenal stranding is partially included. Cyst in the upper left kidney. Musculoskeletal: Heterogeneous enlargement of the left pectoralis muscle with ill-defined edema, fluid, and air. No confluent focal drainable fluid collection in the subcutaneous tissues. Inflammatory process extends intrathoracic via the first-second intercostal space anteriorly where there is a 3 cm rounded extrapleural air fluid collection. No osseous erosions of the adjacent ribs. There are foci of soft tissue air in the right axilla and adjacent to the proximal humerus in the region of the shoulder joint, but no definite intra-articular air. No evidence of discitis osteomyelitis in the spine. IMPRESSION: 1. Left anterior chest wall soft tissue and intramuscular infection with heterogeneous fluid/stranding and  patchy soft tissue air. There is extension into the thoracic cavity anteriorly through the first-second rib space with extrapleural heterogeneous air-fluid collection measuring approximately 3 cm accounting for radiographic appearance. 2. Narrowing of the left brachiocephalic vein may be due to venous spasm versus mass effect from adjacent inflammatory process. No obvious filling defects. Contrast refluxes into left upper extremity collaterals. 3. Air in the soft tissues of the right axilla and adjacent to the right shoulder. While this can be seen in the setting of IV placement, given soft tissue findings of the left chest, additional soft tissue infection is suggested. 4. Findings suspicious for superficial thrombophlebitis in the right arm with stranding about a prominent subcutaneous vessel. 5. Multiple small pulmonary nodules within both lungs, likely infectious or inflammatory in a patient of this age. Septic emboli are considered, however no central cavitation, and the largest nodule is only 6 mm. 6. Nonspecific right perinephric edema is partially included in the upper abdomen. Electronically Signed   By: Narda Rutherford M.D.   On: 08/01/2019 04:04   Mr Lumbar Spine W Wo Contrast  Result Date: 08/01/2019 CLINICAL DATA:  Low back pain.  Fever.  IV drug abuse. EXAM: MRI LUMBAR SPINE WITHOUT AND WITH CONTRAST TECHNIQUE: Multiplanar and multiecho pulse sequences of the lumbar spine were obtained without and with intravenous contrast. CONTRAST:  8.5 mL Gadavist. COMPARISON:  None. FINDINGS: Segmentation: 5 non rib-bearing lumbar type vertebral bodies are present. The lowest fully formed vertebral body is L5. Alignment: AP alignment is anatomic. No significant listhesis is present. Vertebrae: Edematous changes are present throughout the L5 vertebral body and in the superior aspect of the S1 vertebral body. There is abnormal enhancement. Abnormal signal and enhancement are present in the disc space is well.  Findings are consistent with disc osteomyelitis. There is no collapse. Marrow signal and vertebral body heights are otherwise normal. Conus medullaris and cauda equina: Conus extends to the L1 level. Conus and cauda equina appear normal. Paraspinal and other soft tissues: Limited imaging the abdomen is unremarkable. There is no significant adenopathy. No solid organ lesions are present. A peripherally enhancing heterogeneous fluid collection is present in the right paraspinous soft  tissues measuring 2.3 x 2.2 by at least 4.5 cm. This likely communicates with the right L4-5 facet. Disc levels: Loculated posterior peripherally enhancing fluid collection begins at the L2-3 level and extends inferiorly to L5. The largest components are at L2-3 and L3-4 on the right r this displaces the thecal sac anteriorly and to the left. Axial dimension is 1.4 x 1.2 cm at the L3-4 level. The collection is more posterior and central at the L2-3 level, measuring up to 12 mm. Abnormal signal and enhancement extends into the right foramen at L4-5. L1-2: Negative. L2-3: Enhancing fluid collections are present posteriorly on the right with right subarticular narrowing. L3-4: Right posterolateral and foraminal fluid collections enhancement creates some mass effect. L4-5: Right lateral recess enhancement extending into the right L4-5 foramen creates mass effect. The left foramen is patent. A mild broad-based disc protrusion is present with a far left annular tear. L5-S1: Abnormal signal and enhancement is present in the disc. A far right heterogeneous fluid collection is contiguous with the other collections, measuring at least 1.7 x 1.4 cm. Posterior soft tissue enhancement extends into the musculature overlying the sacrum. IMPRESSION: 1. Extensive right posterolateral epidural abscess extending from the L2-3 level into the sacral regions. 2. Abnormal enhancement and mass effect extending into the foramina on the right at L4-5 and L5-S1. 3.  Disc osteomyelitis at L5-S1 without significant collapse. 4. Ventral epidural abscess L5 through S1-2 5. Posterior soft tissue abscess in the right paraspinous musculature extending from L3-4 into the sacral levels. 6. Abnormal enhancement suggesting communicating abscess through the right L4-5 facet joint. Critical Value/emergent results were called by telephone at the time of interpretation on 08/01/2019 at 6:14 am to Dr. Rochele RaringKRISTEN WARD , who verbally acknowledged these results. Electronically Signed   By: Marin Robertshristopher  Mattern M.D.   On: 08/01/2019 06:16   Dg Chest Portable 1 View  Result Date: 08/01/2019 CLINICAL DATA:  Fever and pain for 5 days EXAM: PORTABLE CHEST 1 VIEW COMPARISON:  None. FINDINGS: Irregular masslike opacity in the left upper lobe no features of edema, pneumothorax, or effusion. Pulmonary vascularity is normally distributed. The cardiomediastinal contours are unremarkable. No acute osseous or soft tissue abnormality. IMPRESSION: Masslike opacity is present in the left upper lobe. This while this could reflect pneumonia in the left upper lobe in the setting of fever, the smooth margins along the inferior aspect are suspicious. Recommend further evaluation with contrast-enhanced CT imaging. Electronically Signed   By: Kreg ShropshirePrice  DeHay M.D.   On: 08/01/2019 00:56    Impression/Plan   38 y.o. male with history IVDU presenting with multiple complaints including back pain, shoulder pain and heal pain. Found to have left anterior chest wall mass with extension to thoracic cavity and lumbar epidural abscess, discitis/osteomyelitis and paraspinal abscess. He is neurologically intact with exception of PF and DF weakness right foot secondary to pain from heal ulcer. He is admitted under PCCM for management.  MRI lumbar spine reviewed. The is extensive right posterolateral epidural abscess and extending from L2-3 to sacrum and ventral epidural abscess from L5 to sacrum. There is enhacement extending  into right L4-5 and L5-S1 foramen, L4-5 septic facet, L5-S1 discitis osteomyelitis. There is no critical stenosis and given neurologic status do not believe he needs any NS intervention at present.   Appears he will be going to OR for I&D left chest wall abscess later today where cultures can be obtained. Continue antibiotics per primary team. Frequent neuro checks q 2 hours. Report any  change.  Cindra PresumeVincent Tyshawn Ciullo, PA-C [facility]

## 2019-08-01 NOTE — Anesthesia Procedure Notes (Signed)
Procedure Name: Intubation Date/Time: 08/01/2019 1:16 PM Performed by: Inda Coke, CRNA Pre-anesthesia Checklist: Patient identified, Emergency Drugs available, Suction available and Patient being monitored Patient Re-evaluated:Patient Re-evaluated prior to induction Oxygen Delivery Method: Circle System Utilized Preoxygenation: Pre-oxygenation with 100% oxygen Induction Type: IV induction Ventilation: Mask ventilation without difficulty Laryngoscope Size: Mac and 4 Grade View: Grade I Tube type: Oral Tube size: 8.0 mm Number of attempts: 1 Airway Equipment and Method: Stylet and Oral airway Placement Confirmation: ETT inserted through vocal cords under direct vision,  positive ETCO2 and breath sounds checked- equal and bilateral Secured at: 22 cm Tube secured with: Tape Dental Injury: Teeth and Oropharynx as per pre-operative assessment

## 2019-08-02 ENCOUNTER — Inpatient Hospital Stay (HOSPITAL_COMMUNITY): Payer: BC Managed Care – PPO

## 2019-08-02 ENCOUNTER — Encounter (HOSPITAL_COMMUNITY): Payer: Self-pay | Admitting: Cardiothoracic Surgery

## 2019-08-02 DIAGNOSIS — R6520 Severe sepsis without septic shock: Secondary | ICD-10-CM | POA: Diagnosis not present

## 2019-08-02 DIAGNOSIS — M4646 Discitis, unspecified, lumbar region: Secondary | ICD-10-CM | POA: Diagnosis not present

## 2019-08-02 DIAGNOSIS — F1190 Opioid use, unspecified, uncomplicated: Secondary | ICD-10-CM

## 2019-08-02 DIAGNOSIS — E118 Type 2 diabetes mellitus with unspecified complications: Secondary | ICD-10-CM

## 2019-08-02 DIAGNOSIS — M4647 Discitis, unspecified, lumbosacral region: Secondary | ICD-10-CM

## 2019-08-02 DIAGNOSIS — A4101 Sepsis due to Methicillin susceptible Staphylococcus aureus: Principal | ICD-10-CM

## 2019-08-02 DIAGNOSIS — A419 Sepsis, unspecified organism: Secondary | ICD-10-CM | POA: Diagnosis not present

## 2019-08-02 DIAGNOSIS — I2690 Septic pulmonary embolism without acute cor pulmonale: Secondary | ICD-10-CM | POA: Diagnosis not present

## 2019-08-02 DIAGNOSIS — L02213 Cutaneous abscess of chest wall: Secondary | ICD-10-CM | POA: Diagnosis present

## 2019-08-02 DIAGNOSIS — G061 Intraspinal abscess and granuloma: Secondary | ICD-10-CM | POA: Diagnosis not present

## 2019-08-02 DIAGNOSIS — M4627 Osteomyelitis of vertebra, lumbosacral region: Secondary | ICD-10-CM | POA: Diagnosis not present

## 2019-08-02 DIAGNOSIS — Z978 Presence of other specified devices: Secondary | ICD-10-CM

## 2019-08-02 DIAGNOSIS — N179 Acute kidney failure, unspecified: Secondary | ICD-10-CM | POA: Diagnosis not present

## 2019-08-02 DIAGNOSIS — S21109A Unspecified open wound of unspecified front wall of thorax without penetration into thoracic cavity, initial encounter: Secondary | ICD-10-CM | POA: Diagnosis not present

## 2019-08-02 DIAGNOSIS — B9561 Methicillin susceptible Staphylococcus aureus infection as the cause of diseases classified elsewhere: Secondary | ICD-10-CM

## 2019-08-02 DIAGNOSIS — K5900 Constipation, unspecified: Secondary | ICD-10-CM

## 2019-08-02 DIAGNOSIS — R110 Nausea: Secondary | ICD-10-CM

## 2019-08-02 DIAGNOSIS — R7881 Bacteremia: Secondary | ICD-10-CM | POA: Diagnosis present

## 2019-08-02 DIAGNOSIS — M4626 Osteomyelitis of vertebra, lumbar region: Secondary | ICD-10-CM | POA: Diagnosis not present

## 2019-08-02 LAB — CBC
HCT: 24.8 % — ABNORMAL LOW (ref 39.0–52.0)
Hemoglobin: 9.1 g/dL — ABNORMAL LOW (ref 13.0–17.0)
MCH: 31.1 pg (ref 26.0–34.0)
MCHC: 36.7 g/dL — ABNORMAL HIGH (ref 30.0–36.0)
MCV: 84.6 fL (ref 80.0–100.0)
Platelets: 336 10*3/uL (ref 150–400)
RBC: 2.93 MIL/uL — ABNORMAL LOW (ref 4.22–5.81)
RDW: 14.9 % (ref 11.5–15.5)
WBC: 13.4 10*3/uL — ABNORMAL HIGH (ref 4.0–10.5)
nRBC: 0 % (ref 0.0–0.2)

## 2019-08-02 LAB — GLUCOSE, CAPILLARY
Glucose-Capillary: 257 mg/dL — ABNORMAL HIGH (ref 70–99)
Glucose-Capillary: 276 mg/dL — ABNORMAL HIGH (ref 70–99)
Glucose-Capillary: 219 mg/dL — ABNORMAL HIGH (ref 70–99)
Glucose-Capillary: 238 mg/dL — ABNORMAL HIGH (ref 70–99)
Glucose-Capillary: 210 mg/dL — ABNORMAL HIGH (ref 70–99)

## 2019-08-02 LAB — URINE CULTURE: Culture: >=100000 — AB

## 2019-08-02 LAB — HCV AB W REFLEX TO QUANT PCR: HCV Ab: 0.2 s/co ratio (ref 0.0–0.9)

## 2019-08-02 LAB — HIV ANTIBODY (ROUTINE TESTING W REFLEX): HIV Screen 4th Generation wRfx: NONREACTIVE

## 2019-08-02 LAB — HCV INTERPRETATION

## 2019-08-02 MED ORDER — SENNOSIDES-DOCUSATE SODIUM 8.6-50 MG PO TABS
1.0000 | ORAL_TABLET | Freq: Two times a day (BID) | ORAL | Status: DC
  Administered 2019-08-02 – 2019-08-15 (×23): 1 via ORAL
  Filled 2019-08-02 (×30): qty 1

## 2019-08-02 MED ORDER — ONDANSETRON HCL 4 MG/2ML IJ SOLN
4.0000 mg | Freq: Three times a day (TID) | INTRAMUSCULAR | Status: DC | PRN
  Administered 2019-08-02 – 2019-08-03 (×3): 4 mg via INTRAVENOUS
  Filled 2019-08-02 (×3): qty 2

## 2019-08-02 MED ORDER — CEFAZOLIN SODIUM-DEXTROSE 2-4 GM/100ML-% IV SOLN
2.0000 g | Freq: Three times a day (TID) | INTRAVENOUS | Status: DC
  Administered 2019-08-02 – 2019-08-06 (×11): 2 g via INTRAVENOUS
  Filled 2019-08-02 (×14): qty 100

## 2019-08-02 MED ORDER — INSULIN GLARGINE 100 UNIT/ML ~~LOC~~ SOLN
14.0000 [IU] | Freq: Every day | SUBCUTANEOUS | Status: DC
  Administered 2019-08-02 – 2019-08-03 (×2): 14 [IU] via SUBCUTANEOUS
  Filled 2019-08-02 (×3): qty 0.14

## 2019-08-02 MED ORDER — SODIUM BICARBONATE 8.4 % IV SOLN
INTRAVENOUS | Status: AC
  Filled 2019-08-02: qty 50

## 2019-08-02 MED ORDER — RAMELTEON 8 MG PO TABS
8.0000 mg | ORAL_TABLET | Freq: Every day | ORAL | Status: DC
  Administered 2019-08-02 – 2019-08-07 (×6): 8 mg via ORAL
  Filled 2019-08-02 (×6): qty 1

## 2019-08-02 MED ORDER — METRONIDAZOLE 500 MG PO TABS
500.0000 mg | ORAL_TABLET | Freq: Three times a day (TID) | ORAL | Status: DC
  Administered 2019-08-02 – 2019-08-06 (×13): 500 mg via ORAL
  Filled 2019-08-02 (×13): qty 1

## 2019-08-02 MED ORDER — PROMETHAZINE HCL 25 MG/ML IJ SOLN
6.2500 mg | Freq: Four times a day (QID) | INTRAMUSCULAR | Status: DC | PRN
  Administered 2019-08-02 – 2019-08-09 (×2): 6.25 mg via INTRAVENOUS
  Filled 2019-08-02 (×5): qty 1

## 2019-08-02 NOTE — Progress Notes (Signed)
Received diabetes coordinator consult. Spoke with staff RN and she said that patient would be moved to floor sometime today. Will follow up on 08/03/19 with patient on new diagnosis of diabetes. Noted that HgbA1C is 7.8%.   Harvel Ricks RN BSN CDE Diabetes Coordinator Pager: [phone]  8am-5pm

## 2019-08-02 NOTE — Progress Notes (Signed)
  NEUROSURGERY PROGRESS NOTE   No issues overnight.  Complains of moderate lower back pain. Denies radicular sx. No change in motor  EXAM:  BP (!) 143/93   Pulse 95   Temp 98.7 F (37.1 C) (Oral)   Resp (!) 22   Ht 6\' 2"  (1.88 m)   Wt 84.8 kg   SpO2 98%   BMI 24.01 kg/m   Awake, alert, oriented  Speech fluent, appropriate  CN grossly intact  MAEW with symmetric strength, nonfocal  PLAN Stable neurologically. No plan for NS intervention. Continue treatment per primary team No new NS recs

## 2019-08-02 NOTE — H&P (Signed)
NAME:  William [name], MRN:  [id number], DOB:  Apr 07, 1981, LOS: 1 ADMISSION DATE:  07/31/2019, CONSULTATION DATE:  08/01/19 REFERRING MD:  Ward  CHIEF COMPLAINT:  Pain   Brief History   William [name] is a 38 y.o. male who was admitted 9/2 with sepsis due to left upper chest wall mass in setting IV drug abuse.  History of present illness   William [name] is a 38 y.o. male who has a PMH including but not limited to heroin abuse.  He presented to [facility] ED 9/1 with multiple complaints including lower back pain, left shoulder pain, right foot / heal pain.  He was seen in UC and given a muscle relaxer and diclofenac.  Urine turned dark so he discontinued diclofenac.  He later spiked fever so returned to UC and was prescribed prednisone.  He continued to have symptoms so used heroin 9/1.  Later felt much worse so came to ED for further evaluation.  In ED, he was noted to have right heal abscess and left upper chest wall mass.  CXR showed mass in LUL.  CT noteable for left anterior chest wall soft tissue and intramuscular infection with extension into the thoracic cavity, air in soft tissues of right axilla adjacent to right shoulder, multiple small pulmonary nodules within both lungs suspicious for septic emboli given history.  CT surgery was consulted and recommended PCCM admission to ICU.  They will likely see in AM and take to OR.  Past Medical History  Heroin abuse.  Significant Hospital Events   9/2 > admit. Surgery.  Consults:  TCTS.  Procedures:  None.  Significant Diagnostic Tests:  CT chest 9/2 > left anterior chest wall soft tissue and intramuscular infection with extension into the thoracic cavity, air in soft tissues of right axilla adjacent to right shoulder, multiple small pulmonary nodules within both lungs suspicious for septic emboli given history. MRI L spine 9/2 >   TEE: no veg or significant valvular abn Echo 9/2 >  No veg or significant valvular abn  08/02/19 MRI   IMPRESSION: 1. Extensive right posterolateral epidural abscess extending from the L2-3 level into the sacral regions. 2. Abnormal enhancement and mass effect extending into the foramina on the right at L4-5 and L5-S1. 3. Disc osteomyelitis at L5-S1 without significant collapse. 4. Ventral epidural abscess L5 through S1-2 5. Posterior soft tissue abscess in the right paraspinous musculature extending from L3-4 into the sacral levels. 6. Abnormal enhancement suggesting communicating abscess through the right L4-5 facet joint.  Micro Data:  Blood 9/2 > MSSA SARS CoV2 9/2 > neg. R foot wound 9/2 >MSSA  Chest wound 9/2 >  Antimicrobials:  Vanc 9/2 >  Cefepime 9/2 >   Interim history/subjective:  No events.  Has some chest wall pain still and shooting pains down legs.  Remains HD stable.  No BM but had diarrhea leading up to admission.  Objective:  Blood pressure (!) 144/87, pulse 98, temperature 99.4 F (37.4 C), temperature source Oral, resp. rate (!) 26, height 6\' 2"  (1.88 m), weight 84.8 kg, SpO2 98 %.        Intake/Output Summary (Last 24 hours) at 08/02/2019 0934 Last data filed at 08/02/2019 0900 Gross per 24 hour  Intake 3092.4 ml  Output 4560 ml  Net -1467.6 ml   Filed Weights   07/31/19 2254 08/01/19 0415  Weight: 88 kg 84.8 kg    Examination: GEN: young man in NAD HEENT: MM dry, no thrush CV:  RRR, no murmurs, ext warm PULM: Clear, no accessory muscle use GI: Soft, +BS EXT: R foot wound/blister noted, vac in place over L chest, margins look okay NEURO: moving all 4 ext to command, good strength PSYCH: AOx3, fair insight SKIN: multiple tract marks  Assessment & Plan:  # IVDA and MSSA bacteremia with seeding to kidneys, lungs, chest wall, spine.  No clear evidence of endocarditis on TTE and TEE.  S/p drainage of chest wall abscess. No operative plans for discitis or epidural abscess. # New DM- A1c 7.8%  - OOB to chair - Start heparin for DVT ppx - Encourage  PO - ID to see regarding abx choice and duration, repeat cultures drawn today - Start lantus, continue SSI, titrate PRN - Appreciate ID/CTS/NSGY consults  Best Practice:  Diet: diabetic diet Pain/Anxiety/Delirium protocol (if indicated): N/A. VAP protocol (if indicated): N/A. DVT prophylaxis: SCD's / Heparin. GI prophylaxis: N/A. Glucose control: see above Mobility: OOB to chair Code Status: Full. Family Communication: None available. Disposition: surg-tele, appreciate TRH taking over starting 9/4  Labs   CBC: Recent Labs  Lab 07/31/19 2302 08/01/19 0909 08/01/19 0929 08/02/19 0309  WBC 19.3*  --  13.7* 13.4*  NEUTROABS 18.5*  --   --   --   HGB 11.1* 9.5* 8.9* 9.1*  HCT 32.3* 28.0* 25.8* 24.8*  MCV 88.7  --  87.2 84.6  PLT 347  --  291 336   Basic Metabolic Panel: Recent Labs  Lab 07/31/19 2302 08/01/19 0909 08/01/19 0929  NA 120* 133* 134*  K 6.0* 4.1 4.5  CL 79*  --  97*  CO2 27  --  26  GLUCOSE 701*  --  179*  BUN 53*  --  39*  CREATININE 1.62*  --  1.25*  CALCIUM 8.4*  --  8.0*  MG  --   --  2.1  PHOS  --   --  3.5   GFR: Estimated Creatinine Clearance: 93.2 mL/min (A) (by C-G formula based on SCr of 1.25 mg/dL (H)). Recent Labs  Lab 07/31/19 2302 08/01/19 0058 08/01/19 0929 08/02/19 0309  WBC 19.3*  --  13.7* 13.4*  LATICACIDVEN 3.7* 3.5*  --   --    Liver Function Tests: Recent Labs  Lab 07/31/19 2302 08/01/19 0929  AST 19 20  ALT 25 18  ALKPHOS 276* 178*  BILITOT 2.7* 2.6*  PROT 6.9 5.3*  ALBUMIN 1.7* 1.4*   No results for input(s): LIPASE, AMYLASE in the last 168 hours. No results for input(s): AMMONIA in the last 168 hours. ABG    Component Value Date/Time   PHART 7.482 (H) 08/01/2019 0909   PCO2ART 41.7 08/01/2019 0909   PO2ART 83.0 08/01/2019 0909   HCO3 31.0 (H) 08/01/2019 0909   TCO2 32 08/01/2019 0909   O2SAT 97.0 08/01/2019 0909    Coagulation Profile: Recent Labs  Lab 07/31/19 2302 08/01/19 0929  INR 1.3* 1.3*    Cardiac Enzymes: Recent Labs  Lab 07/31/19 2302  CKTOTAL 71   HbA1C: Hgb A1c MFr Bld  Date/Time Value Ref Range Status  08/01/2019 09:29 AM 7.8 (H) 4.8 - 5.6 % Final    Comment:    (NOTE) Pre diabetes:          5.7%-6.4% Diabetes:              >6.4% Glycemic control for   <7.0% adults with diabetes    CBG: Recent Labs  Lab 08/01/19 1419 08/01/19 1523 08/01/19 1639 08/01/19 2302 08/02/19  0815  GLUCAP 166* 189* 236* 276* 257*

## 2019-08-02 NOTE — Op Note (Signed)
NAME: William [name], William [name] MEDICAL RECORD AS:[id number] ACCOUNT [id number] DATE OF BIRTH:12-14-1980 FACILITY: [facility] LOCATION: [place], [place]  OPERATIVE REPORT  DATE OF PROCEDURE:  08/01/2019  PREOPERATIVE DIAGNOSIS:  Multiple septic emboli associated with intravenous drug use with evidence of sepsis on admission.  POSTOPERATIVE DIAGNOSIS:  Multiple septic emboli associated with intravenous drug use with evidence of sepsis on admission with left anterior chest wall abscess.  PROCEDURE PERFORMED:  Drainage of left anterior chest abscess and placement of wound vacuum-assisted closure and placement of a TEE probe with transesophageal echo.  SURGEON:  Lanelle Bal, MD  FIRST ASSISTANT:  Melodie Bouillon, MD  BRIEF HISTORY:  The patient is a 38 year old male with extensive history of IV drug abuse who presents to urgent care on 2 separate occasions over the last several weeks.  He was started on steroids and anti-inflammatories for back pain.  Ultimately  presented to the [facility] Emergency Room with fever, hypotension, tachycardia, left upper chest pain, back pain, ankle pain.  He was seen and evaluated in the emergency room.  Cultures were obtained.  CT of the chest shows left anterior chest wall abscess  extending between the ribs and into the thoracic cavity.  In addition, he had evidence of probable septic pulmonary emboli.  Further evaluation showed a paraspinous abscess.  The patient was started on IV antibiotics, and IV hydration hemodynamics  improved.  His glucose was 700 on admission.  With his obvious active infection and with his hemodynamic situation stabilized, we took him to the operating room where he underwent general endotracheal anesthesia.  A TEE probe was placed by Dr.  ____.   The anterior chest was prepped with Betadine, draped in a sterile manner.  The left side had been preoperatively marked.  An appropriate timeout was performed.  We then made a 5 cm  incision over the most prominent portion of the upper anterior chest  anteriorly through the subcutaneous tissue, separating the pectoralis major muscle fibers down into a subpectoral pocket of purulent material.  This was cultured.  We continued dissection.  The tract carried also up to the left sternoclavicular joint  with all purulent material removed.  The wound was irrigated with 3 L of saline with Pulsavac.  A black wound VAC sponge was left in the wound and wound VAC applied.  At the completion of the procedure, sponge and needle count was reported as correct.   Blood loss was minimal.  Appropriate cultures of both tissue and fluid were sent.  Concomitantly, Dr. Clementeen Graham  under a separate note performed a TEE looking at the patient's aortic and mitral valves and tricuspid valve.  There was no evidence of endocarditis  or aortic or mitral insufficiency.  The patient was extubated in the operating room and transferred to the recovery room for postoperative care, having tolerated the procedure without obvious complication.  LN/NUANCE  D:08/01/2019 T:08/02/2019 JOB:[id number]

## 2019-08-02 NOTE — Progress Notes (Signed)
Report given to [employer].

## 2019-08-02 NOTE — Progress Notes (Addendum)
1 Day Post-Op Procedure(s) (LRB): I&D Left Chest wall abscess with application of wound vac (Left) TRANSESOPHAGEAL ECHOCARDIOGRAM (TEE) (N/A) Subjective: Resting in bed, says he feel better overall with less pain.   Objective: Vital signs in last 24 hours: Temp:  [98.7 F (37.1 C)-99.4 F (37.4 C)] 99.4 F (37.4 C) (09/03 0837) Pulse Rate:  [85-110] 85 (09/03 0843) Cardiac Rhythm: Normal sinus rhythm;Sinus tachycardia (09/03 0800) Resp:  [14-30] 30 (09/03 0843) BP: (143-154)/(78-93) 146/78 (09/03 0843) SpO2:  [97 %-100 %] 100 % (09/03 0843)     Intake/Output from previous day: 09/02 0701 - 09/03 0700 In: 2323.2 [I.V.:2052.6; IV Piggyback:270.7] Out: 4128 [Urine:4525; Blood:15] Intake/Output this shift: Total I/O In: 861.1 [I.V.:611.9; IV Piggyback:249.3] Out: 20 [Emesis/NG output:20]  General appearance: alert, cooperative and mild distress Neurologic: No obvious focal deficits.  Heart: regular rate and rhythm Wound: Wound vac applied to the left chest wound. It is functioning appropriatly and had minimal drainage. Surrounding tissues appear healthy.   Lab Results: Recent Labs    08/01/19 0929 08/02/19 0309  WBC 13.7* 13.4*  HGB 8.9* 9.1*  HCT 25.8* 24.8*  PLT 291 336   BMET:  Recent Labs    07/31/19 2302 08/01/19 0909 08/01/19 0929  NA 120* 133* 134*  K 6.0* 4.1 4.5  CL 79*  --  97*  CO2 27  --  26  GLUCOSE 701*  --  179*  BUN 53*  --  39*  CREATININE 1.62*  --  1.25*  CALCIUM 8.4*  --  8.0*    PT/INR:  Recent Labs    08/01/19 0929  LABPROT 16.0*  INR 1.3*   ABG    Component Value Date/Time   PHART 7.482 (H) 08/01/2019 0909   HCO3 31.0 (H) 08/01/2019 0909   TCO2 32 08/01/2019 0909   O2SAT 97.0 08/01/2019 0909   CBG (last 3)  Recent Labs    08/01/19 1639 08/01/19 2302 08/02/19 0815  GLUCAP 236* 276* 257*    Assessment/Plan: S/P Procedure(s) (LRB): I&D Left Chest wall abscess with application of wound vac (Left) TRANSESOPHAGEAL  ECHOCARDIOGRAM (TEE) (N/A)  -POD1 incision and drainage of left chest wall and sternoclavicular joint abscess and placement of a wound vac. Operative cultures are pending but Gram stain shows abundant Gm (+) cocci. Admission blood Cx also positive for Gm(+) cocci.  Afebrile Continue broad spectrum ABX for now. Plan VAC change tomorrow.   -OK to resume heparin DVT PPX and OK to mobilize from CT surgery standpoint.    LOS: 1 day    Antony Odea, PA-C [phone] 08/02/2019  I have seen and examined William [name] and agree with the above assessment  and plan.  Grace Isaac MD Beeper [phone] Office [phone] 08/02/2019 3:15 PM

## 2019-08-02 NOTE — Consult Note (Signed)
[facility]       Reason for Consult: MSSA sepsis, lumbar discitis, chest wall abscess    Referring Physician: Levon Hedger, MD  Principal Problem:   Sepsis [facility]) Active Problems:   Abscess of paraspinous muscles   Osteomyelitis of lumbar spine (HCC)   Epidural abscess of spine due to infective embolism   Abnormal chest CT   Septic embolism (HCC)   Community acquired pneumonia   IV drug abuse (HCC)   Heroin abuse (HCC)   Elevated troponin   UTI (urinary tract infection)   Renal failure   Hyperglycemia   Suspected endocarditis    Chlorhexidine Gluconate Cloth  6 each Topical Daily   heparin  5,000 Units Subcutaneous Q8H   insulin aspart  0-15 Units Subcutaneous TID WC   insulin aspart  0-5 Units Subcutaneous QHS   insulin glargine  14 Units Subcutaneous Daily   ondansetron  4 mg Intravenous Once   ramelteon  8 mg Oral QHS   senna-docusate  1 tablet Oral BID    Recommendations: 1. MSSA sepsis/chest wall abscess/septic pulmonary emboli - de-escalate ABX from vanc/cefepime to cefazolin 2 gm IV q 8 hrs and flagyl 500 mg PO q 8 hrs. TEE performed intra-op at the time of LT chest wall debridement and showed no active vegetation or significant valvular abnormalities. If BSI fails to clear, may need to consider repeating at a later time. Given presence of septic PEs, fever, known IVDU, and several other sites of metastatic spread with a high risk pathogen, endocarditis is highly suspected.   2. Lumbar osteomyelitis - presence of multilevel spinal osteomyelitis with now confirmed MSSA along with lumbar epidural abscess makes this patient high risk to fail attempts at medical monotherapy with even prolonged optimized ABX alone. If he fails to clear his BSI, he should be considered for surgical intervention to lower his morbidity and mortality from his infection. At minimum, he would benefit from an aspiration of his lumbar epidural abscess for  better control of this site. He will require 8 weeks of parenteral ABX treatment in total.  3. New onset DM -the patient's blood sugar was initially in the 700 range, requiring an insulin drip while in the ICU.  Will defer management to primary care service.  Would aim for tight glycemic control with blood sugars consistently at 150 or less to optimize his infectious outcome.  Assessment: The patient is a 38 y/o M IVDuer admitted with new onset DM, fever and metastatic MSSA sepsis with septic PEs and LT chest wall abscess, s/p debridement, and multi-level lumbar osteomyelitis/epidural abscesses.  Antibiotics: Vancomycin, day 3 Cefepime, day 3  HPI: William [name] is a 38 y.o. male IVDUer with newly diagnosed DM admitted on 08/01/2019 for LT chest wall pain/mass.  He reluctantly admits to daily heroin injection use for much of the last 8 to 10 years.  For 2 days prior to admission the patient had been evaluated in multiple local urgent cares.  He states no blood work was drawn nor imaging performed and he was given prescriptions for nonnarcotics and a muscle relaxer due to his complaint of chest wall pain and low back pain.  Upon presentation here he was febrile to 102.2 blood cultures were obtained which have now confirmed MSSA.  Imaging has confirmed a mass within his left upper lobe with follow-up CT imaging showing a soft tissue chest wall abscess and intramuscular extension into the thoracic cavity and multiple small pulmonary emboli  that were believed to be septic in nature. Fever curve, WBC & glycemic trends, prior serologies, imaging, and ABX usage all independently reviewed.  MRI of his lumbar spine showed extensive posterior lateral epidural abscess extending from L2 to the sacral region at S1 with focal discitis/osteomyelitis at L5-S1 without significant collapse.  There also was concern for abnormal enhancement around the L4-L5 facet joint worrisome for communicating abscess and L3-L4 right  paraspinal abscesses as well as a mass-effect extending to the foramina at the right L4-L5 and L5-S1 disc space.  He has been evaluated by the neurosurgical service and declined for intervention at this time.  Fortunately, the CT surgery service did take the patient to the operating room for debridement of his chest wall abscess.  Operative cultures are subsequently growing gram-positive cocci.  Patient was empirically started on vancomycin and cefepime upon presentation.  Repeat blood cultures were obtained today and are thus far unrevealing.  Of note, an intraoperative transesophageal echocardiogram was performed at the time of his chest wall abscess debridement and surprisingly showed no evidence of active endocarditis or valvular abnormalities.  He also was noted to have a right heel wound.  No imaging has been performed to this area thus far.  The patient is adamant he is injected only to his right arm in recent days.  While opportunity was given for the patient's girlfriend to be excused from today's evaluation prior to beginning medical interview, in the presence of the patient's girlfriend he admits to only injecting himself with vitamin B12.  Once this topic was raised, the patient asked his girlfriend to step out of the room.  It was only at this time that he admitted to daily IV heroin use for many years, stating that his girlfriend is unaware of his habits.  He did have negative serologies for both hepatitis C and HIV in June of this year.  Review of Systems:  Review of Systems  Constitutional: Positive for fever. Negative for chills and weight loss.  HENT: Negative for congestion, hearing loss, sinus pain and sore throat.   Eyes: Negative for blurred vision, photophobia and discharge.  Respiratory: Negative for cough, hemoptysis and shortness of breath.   Cardiovascular: Positive for chest pain. Negative for palpitations, orthopnea and leg swelling.  Gastrointestinal: Positive for constipation  and nausea. Negative for abdominal pain, diarrhea, heartburn and vomiting.  Genitourinary: Negative for dysuria, flank pain, frequency and urgency.  Musculoskeletal: Positive for back pain. Negative for joint pain and myalgias.  Skin: Negative for itching and rash.  Neurological: Negative for tremors, seizures, weakness and headaches.  Endo/Heme/Allergies: Negative for polydipsia. Does not bruise/bleed easily.  Psychiatric/Behavioral: Positive for substance abuse. Negative for depression. The patient is nervous/anxious. The patient does not have insomnia.      All other systems reviewed and are negative    PMH: IVDU with daily heroin x 10 years DM, dx'ed on this admission  Social History   Tobacco Use   Smoking status: Never Smoker   Smokeless tobacco: Never Used  Substance Use Topics   Alcohol use: Not Currently   Drug use: Yes    Comment: opiates    daily heroin/IVDU x 10 years  History reviewed. No pertinent family history.   Current Facility-Administered Medications:    0.9 %  sodium chloride infusion, 1,000 mL, Intravenous, Continuous, Candee Furbish, MD, Last Rate: 125 mL/hr at 08/02/19 0900   ceFEPIme (MAXIPIME) 2 g in sodium chloride 0.9 % 100 mL IVPB, 2 g, Intravenous, Q8H,  Leary Roca, PA-C, Stopped at 08/02/19 4035   Chlorhexidine Gluconate Cloth 2 % PADS 6 each, 6 each, Topical, Daily, Leary Roca, PA-C, 6 each at 08/02/19 0855   heparin injection 5,000 Units, 5,000 Units, Subcutaneous, Q8H, Roddenberry, Myron G, PA-C, 5,000 Units at 08/02/19 2481   HYDROmorphone (DILAUDID) injection 1 mg, 1 mg, Intravenous, Q4H PRN, Leslye Peer, MD, 1 mg at 08/02/19 0533   insulin aspart (novoLOG) injection 0-15 Units, 0-15 Units, Subcutaneous, TID WC, Deterding, Dorise Hiss, MD, 8 Units at 08/02/19 0827   insulin aspart (novoLOG) injection 0-5 Units, 0-5 Units, Subcutaneous, QHS, Deterding, Dorise Hiss, MD, 3 Units at 08/02/19 0018   insulin  glargine (LANTUS) injection 14 Units, 14 Units, Subcutaneous, Daily, Lorin Glass, MD   ondansetron [facility]) injection 4 mg, 4 mg, Intravenous, Once, Roddenberry, Myron G, PA-C   ondansetron [facility]) injection 4 mg, 4 mg, Intravenous, Q8H PRN, Deterding, Dorise Hiss, MD, 4 mg at 08/02/19 0703   oxyCODONE (Oxy IR/ROXICODONE) immediate release tablet 5 mg, 5 mg, Oral, Q6H PRN, Leslye Peer, MD, 5 mg at 08/02/19 0145   ramelteon (ROZEREM) tablet 8 mg, 8 mg, Oral, QHS, Lorin Glass, MD   senna-docusate (Senokot-S) tablet 1 tablet, 1 tablet, Oral, BID, Lorin Glass, MD   vancomycin (VANCOCIN) IVPB 750 mg/150 ml premix, 750 mg, Intravenous, Q8H, Roddenberry, Myron G, PA-C, Stopped at 08/02/19 0640  No Known Allergies  Vitals:   08/02/19 0843 08/02/19 0900  BP: (!) 146/78 (!) 144/87  Pulse: 85 98  Resp: (!) 30 (!) 26  Temp:    SpO2: 100% 98%     Physical Exam Gen: anxious, moderate distress secondary to back and LT chest wall pain, A&Ox 3 Head: NCAT, no temporal wasting evident EENT: PERRL, EOMI, MMM, adequate dentition Neck: supple, mild JVD CV: NRRR, no murmurs evident Pulm: CTA bilaterally, mild expiratory wheeze , no retractions Abd: soft, NTND, +BS (hypoactive) MSK: TTP to lumbar spine, +RT heel wound w/o drainage, LT chest wall wound VAC intact Extrems:  trace LE edema, 2+ pulses Skin: no rashes, adequate skin turgor Neuro: CN II-XII grossly intact, no focal neurologic deficits appreciated, gait was not assessed, A&Ox 3 Psych: very anxious/easily agitated   Lab Results  Component Value Date   WBC 13.4 (H) 08/02/2019   HGB 9.1 (L) 08/02/2019   HCT 24.8 (L) 08/02/2019   MCV 84.6 08/02/2019   PLT 336 08/02/2019    Lab Results  Component Value Date   CREATININE 1.25 (H) 08/01/2019   BUN 39 (H) 08/01/2019   NA 134 (L) 08/01/2019   K 4.5 08/01/2019   CL 97 (L) 08/01/2019   CO2 26 08/01/2019    Lab Results  Component Value Date   ALT 18 08/01/2019   AST  20 08/01/2019   ALKPHOS 178 (H) 08/01/2019     Microbiology: Recent Results (from the past 240 hour(s))  Urine culture     Status: Abnormal (Preliminary result)   Collection Time: 07/31/19 10:58 PM   Specimen: In/Out Cath Urine  Result Value Ref Range Status   Specimen Description IN/OUT CATH URINE  Final   Special Requests NONE  Final   Culture (A)  Final    >=100,000 COLONIES/mL STAPHYLOCOCCUS AUREUS SUSCEPTIBILITIES TO FOLLOW Performed at [facility], [address] [place], [place] [address]    Report Status PENDING  Incomplete  Blood culture (routine x 2)     Status: Abnormal (Preliminary result)   Collection Time:  08/01/19  1:36 AM   Specimen: BLOOD  Result Value Ref Range Status   Specimen Description BLOOD RIGHT ANTECUBITAL  Final   Special Requests   Final    BOTTLES DRAWN AEROBIC AND ANAEROBIC Blood Culture adequate volume   Culture  Setup Time   Final    GRAM POSITIVE COCCI IN BOTH AEROBIC AND ANAEROBIC BOTTLES CRITICAL RESULT CALLED TO, READ BACK BY AND VERIFIED WITH: Ihor AustinJ. Millen PharmD 17:20 08/01/19 ([email or web address]) Performed at [facility], [address] [place], [place] [address]    Culture STAPHYLOCOCCUS AUREUS (A)  Final   Report Status PENDING  Incomplete  Blood Culture ID Panel (Reflexed)     Status: Abnormal   Collection Time: 08/01/19  1:36 AM  Result Value Ref Range Status   Enterococcus species NOT DETECTED NOT DETECTED Final   Listeria monocytogenes NOT DETECTED NOT DETECTED Final   Staphylococcus species DETECTED (A) NOT DETECTED Final    Comment: CRITICAL RESULT CALLED TO, READ BACK BY AND VERIFIED WITH: Ihor AustinJ. Millen PharmD 17:20 08/01/19 ([email or web address])    Staphylococcus aureus (BCID) DETECTED (A) NOT DETECTED Final    Comment: Methicillin (oxacillin) susceptible Staphylococcus aureus (MSSA). Preferred therapy is anti staphylococcal beta lactam antibiotic (Cefazolin or Nafcillin), unless clinically contraindicated. CRITICAL RESULT CALLED TO, READ  BACK BY AND VERIFIED WITH: Ihor AustinJ. Millen PharmD 17:20 08/01/19 ([email or web address])    Methicillin resistance NOT DETECTED NOT DETECTED Final   Streptococcus species NOT DETECTED NOT DETECTED Final   Streptococcus agalactiae NOT DETECTED NOT DETECTED Final   Streptococcus pneumoniae NOT DETECTED NOT DETECTED Final   Streptococcus pyogenes NOT DETECTED NOT DETECTED Final   Acinetobacter baumannii NOT DETECTED NOT DETECTED Final   Enterobacteriaceae species NOT DETECTED NOT DETECTED Final   Enterobacter cloacae complex NOT DETECTED NOT DETECTED Final   Escherichia coli NOT DETECTED NOT DETECTED Final   Klebsiella oxytoca NOT DETECTED NOT DETECTED Final   Klebsiella pneumoniae NOT DETECTED NOT DETECTED Final   Proteus species NOT DETECTED NOT DETECTED Final   Serratia marcescens NOT DETECTED NOT DETECTED Final   Haemophilus influenzae NOT DETECTED NOT DETECTED Final   Neisseria meningitidis NOT DETECTED NOT DETECTED Final   Pseudomonas aeruginosa NOT DETECTED NOT DETECTED Final   Candida albicans NOT DETECTED NOT DETECTED Final   Candida glabrata NOT DETECTED NOT DETECTED Final   Candida krusei NOT DETECTED NOT DETECTED Final   Candida parapsilosis NOT DETECTED NOT DETECTED Final   Candida tropicalis NOT DETECTED NOT DETECTED Final    Comment: Performed at [facility], [address] [place], [place] [address]  SARS Coronavirus 2 [facility](Hospital order, Performed in [facility] hospital lab) Nasopharyngeal Abscess     Status: None   Collection Time: 08/01/19  2:10 AM   Specimen: Abscess; Nasopharyngeal  Result Value Ref Range Status   SARS Coronavirus 2 NEGATIVE NEGATIVE Final    Comment: (NOTE) If result is NEGATIVE SARS-CoV-2 target nucleic acids are NOT DETECTED. The SARS-CoV-2 RNA is generally detectable in upper and lower  respiratory specimens during the acute phase of infection. The lowest  concentration of SARS-CoV-2 viral copies this assay can detect is 250  copies / mL. A negative result  does not preclude SARS-CoV-2 infection  and should not be used as the sole basis for treatment or other  patient management decisions.  A negative result may occur with  improper specimen collection / handling, submission of specimen other  than nasopharyngeal swab, presence of viral mutation(s) within the  areas targeted by this  assay, and inadequate number of viral copies  (<250 copies / mL). A negative result must be combined with clinical  observations, patient history, and epidemiological information. If result is POSITIVE SARS-CoV-2 target nucleic acids are DETECTED. The SARS-CoV-2 RNA is generally detectable in upper and lower  respiratory specimens dur ing the acute phase of infection.  Positive  results are indicative of active infection with SARS-CoV-2.  Clinical  correlation with patient history and other diagnostic information is  necessary to determine patient infection status.  Positive results do  not rule out bacterial infection or co-infection with other viruses. If result is PRESUMPTIVE POSTIVE SARS-CoV-2 nucleic acids MAY BE PRESENT.   A presumptive positive result was obtained on the submitted specimen  and confirmed on repeat testing.  While 2019 novel coronavirus  (SARS-CoV-2) nucleic acids may be present in the submitted sample  additional confirmatory testing may be necessary for epidemiological  and / or clinical management purposes  to differentiate between  SARS-CoV-2 and other Sarbecovirus currently known to infect humans.  If clinically indicated additional testing with an alternate test  methodology [id number]) is advised. The SARS-CoV-2 RNA is generally  detectable in upper and lower respiratory sp ecimens during the acute  phase of infection. The expected result is Negative. Fact Sheet for Patients:  BoilerBrush.com.cyhttps://www.fda.gov/media/136312/download Fact Sheet for Healthcare Providers: https://pope.com/https://www.fda.gov/media/136313/download This test is not yet approved or  cleared by the [place] FDA and has been authorized for detection and/or diagnosis of SARS-CoV-2 by FDA under an Emergency Use Authorization (EUA).  This EUA will remain in effect (meaning this test can be used) for the duration of the COVID-19 declaration under Section 564(b)(1) of the Act, 21 U.S.C. section 360bbb-3(b)(1), unless the authorization is terminated or revoked sooner. Performed at [facility], [address] [place], [place] [address]   Wound or Superficial Culture     Status: None (Preliminary result)   Collection Time: 08/01/19  2:10 AM   Specimen: Abscess; Wound  Result Value Ref Range Status   Specimen Description ABSCESS  Final   Special Requests NONE  Final   Gram Stain   Final    RARE WBC PRESENT, PREDOMINANTLY PMN MODERATE GRAM POSITIVE COCCI IN CLUSTERS Performed at [facility], [address] [place], [place] [address]    Culture ABUNDANT STAPHYLOCOCCUS AUREUS  Final   Report Status PENDING  Incomplete  Blood culture (routine x 2)     Status: Abnormal (Preliminary result)   Collection Time: 08/01/19  2:40 AM   Specimen: BLOOD LEFT ARM  Result Value Ref Range Status   Specimen Description BLOOD LEFT ARM  Final   Special Requests   Final    BOTTLES DRAWN AEROBIC AND ANAEROBIC Blood Culture adequate volume   Culture  Setup Time   Final    GRAM POSITIVE COCCI IN BOTH AEROBIC AND ANAEROBIC BOTTLES CRITICAL VALUE NOTED.  VALUE IS CONSISTENT WITH PREVIOUSLY REPORTED AND CALLED VALUE. Performed at [facility], [address] [place], [place] [address]    Culture STAPHYLOCOCCUS AUREUS (A)  Final   Report Status PENDING  Incomplete  MRSA PCR Screening     Status: None   Collection Time: 08/01/19  7:28 AM   Specimen: Nasal Mucosa; Nasopharyngeal  Result Value Ref Range Status   MRSA by PCR NEGATIVE NEGATIVE Final    Comment:        The GeneXpert MRSA Assay (FDA approved for NASAL specimens only), is one component of  a  comprehensive MRSA colonization surveillance program. It is not intended to diagnose MRSA infection nor to guide or monitor treatment for MRSA infections. Performed at [facility], [address] [place], [place] [address]   Aerobic/Anaerobic Culture (surgical/deep wound)     Status: None (Preliminary result)   Collection Time: 08/01/19  1:38 PM   Specimen: Abscess  Result Value Ref Range Status   Specimen Description ABSCESS LEFT CHEST WALL  Final   Special Requests PATIENT ON FOLLOWING MAXIPINE ANCEF  Final   Gram Stain   Final    ABUNDANT WBC PRESENT, PREDOMINANTLY PMN ABUNDANT GRAM POSITIVE COCCI Performed at [facility], [address] [place], [place] [address]    Culture ABUNDANT STAPHYLOCOCCUS AUREUS  Final   Report Status PENDING  Incomplete   70 minutes of critical care time spent  [place] Lions, MD [facility] for Infectious Disease [facility] [email or web address] 08/02/2019, 10:53 AM

## 2019-08-02 NOTE — Progress Notes (Signed)
Called 3W and left my number for receiving RN to call for report when available.

## 2019-08-02 NOTE — Anesthesia Postprocedure Evaluation (Signed)
Anesthesia Post Note  Patient: William [name]  Procedure(s) Performed: I&D Left Chest wall abscess with application of wound vac (Left ) TRANSESOPHAGEAL ECHOCARDIOGRAM (TEE) (N/A )     Patient location during evaluation: PACU Anesthesia Type: General Level of consciousness: sedated and patient cooperative Pain management: pain level controlled Vital Signs Assessment: post-procedure vital signs reviewed and stable Respiratory status: spontaneous breathing Cardiovascular status: stable Anesthetic complications: no    Last Vitals:  Vitals:   08/02/19 1400 08/02/19 1606  BP: (!) 154/94   Pulse: 90   Resp: (!) 24   Temp:  37 C  SpO2: 98%     Last Pain:  Vitals:   08/02/19 1606  TempSrc: Oral  PainSc:                  Nolon Nations

## 2019-08-03 DIAGNOSIS — F1110 Opioid abuse, uncomplicated: Secondary | ICD-10-CM

## 2019-08-03 DIAGNOSIS — M4646 Discitis, unspecified, lumbar region: Secondary | ICD-10-CM

## 2019-08-03 DIAGNOSIS — R6520 Severe sepsis without septic shock: Secondary | ICD-10-CM

## 2019-08-03 DIAGNOSIS — N179 Acute kidney failure, unspecified: Secondary | ICD-10-CM

## 2019-08-03 DIAGNOSIS — M79671 Pain in right foot: Secondary | ICD-10-CM

## 2019-08-03 DIAGNOSIS — E1169 Type 2 diabetes mellitus with other specified complication: Secondary | ICD-10-CM

## 2019-08-03 DIAGNOSIS — G061 Intraspinal abscess and granuloma: Secondary | ICD-10-CM

## 2019-08-03 DIAGNOSIS — A419 Sepsis, unspecified organism: Secondary | ICD-10-CM

## 2019-08-03 DIAGNOSIS — M6289 Other specified disorders of muscle: Secondary | ICD-10-CM

## 2019-08-03 LAB — CULTURE, BLOOD (ROUTINE X 2)
Special Requests: ADEQUATE
Special Requests: ADEQUATE

## 2019-08-03 LAB — AEROBIC CULTURE W GRAM STAIN (SUPERFICIAL SPECIMEN)

## 2019-08-03 LAB — GLUCOSE, CAPILLARY
Glucose-Capillary: 203 mg/dL — ABNORMAL HIGH (ref 70–99)
Glucose-Capillary: 236 mg/dL — ABNORMAL HIGH (ref 70–99)
Glucose-Capillary: 142 mg/dL — ABNORMAL HIGH (ref 70–99)
Glucose-Capillary: 124 mg/dL — ABNORMAL HIGH (ref 70–99)

## 2019-08-03 MED ORDER — NYSTATIN 100000 UNIT/ML MT SUSP
5.0000 mL | Freq: Four times a day (QID) | OROMUCOSAL | Status: DC
  Administered 2019-08-03 – 2019-08-04 (×5): 500000 [IU] via OROMUCOSAL
  Filled 2019-08-03 (×5): qty 5

## 2019-08-03 MED ORDER — CYCLOBENZAPRINE HCL 10 MG PO TABS
5.0000 mg | ORAL_TABLET | Freq: Three times a day (TID) | ORAL | Status: DC
  Administered 2019-08-03: 5 mg via ORAL
  Filled 2019-08-03 (×3): qty 1

## 2019-08-03 MED ORDER — INSULIN STARTER KIT- PEN NEEDLES (ENGLISH)
1.0000 | Freq: Once | Status: AC
  Administered 2019-08-03: 18:00:00 1
  Filled 2019-08-03: qty 1

## 2019-08-03 MED ORDER — FLUCONAZOLE 100 MG PO TABS
200.0000 mg | ORAL_TABLET | Freq: Once | ORAL | Status: AC
  Administered 2019-08-03: 200 mg via ORAL
  Filled 2019-08-03: qty 2

## 2019-08-03 MED ORDER — LIVING WELL WITH DIABETES BOOK
Freq: Once | Status: AC
  Administered 2019-08-03: 18:00:00
  Filled 2019-08-03: qty 1

## 2019-08-03 NOTE — Procedures (Signed)
Procedure: Right heel I&D  Indication: Right heel abscess  Surgeon: Silvestre Gunner, PA-C  Assist: None  Anesthesia: None  EBL: None  Complications: None  Findings: After risks/benefits explained patient desires to undergo procedure. Consent obtained. The right heel was sterilely prepped. I confirmed that he was insensate over the area. A 0.5cm incision was made without difficulty but when I tried to extend it he had significant pain and I stopped. I expressed as much purulence as I could and collected sample for C&S. Pt tolerated the procedure well.    Lisette Abu, PA-C Orthopedic Surgery [phone]

## 2019-08-03 NOTE — Progress Notes (Signed)
Pt requested for RN and NT not to disturb him whiles sleeping; he requested for his midnight VSS not to be checked and only for staff to come to room when he wakes up and call for assistance. NT informed to not get pt midnight VS; will continue to closely monitor pt. Delia Heady RN

## 2019-08-03 NOTE — Consult Note (Addendum)
Reason for Consult:Right heel abscess Referring Physician: [name] is an 38 y.o. male.  HPI: William [name] was admitted with sepsis 2/2 IVDU. He was noted to have a heel abscess and orthopedic surgery was consulted. He notes that it does not hurt. It was not thought that this abscess was the source of the sepsis.  History reviewed. No pertinent past medical history.  Past Surgical History:  Procedure Laterality Date  . STERNAL WOUND DEBRIDEMENT Left 08/01/2019   Procedure: I&D Left Chest wall abscess with application of wound vac;  Surgeon: Delight Ovens, MD;  Location: [facility] OR;  Service: Thoracic;  Laterality: Left;  . TEE WITHOUT CARDIOVERSION N/A 08/01/2019   Procedure: TRANSESOPHAGEAL ECHOCARDIOGRAM (TEE);  Surgeon: Delight Ovens, MD;  Location: [facility] OR;  Service: Thoracic;  Laterality: N/A;    History reviewed. No pertinent family history.  Social History:  reports that he has never smoked. He has never used smokeless tobacco. He reports previous alcohol use. He reports current drug use.  Allergies: No Known Allergies  Medications: I have reviewed the patient's current medications.  Results for orders placed or performed during the hospital encounter of 07/31/19 (from the past 48 hour(s))  Glucose, capillary     Status: Abnormal   Collection Time: 08/01/19 11:09 AM  Result Value Ref Range   Glucose-Capillary 162 (H) 70 - 99 mg/dL   Comment 1 Notify RN    Comment 2 Document in Chart   Glucose, capillary     Status: Abnormal   Collection Time: 08/01/19 12:22 PM  Result Value Ref Range   Glucose-Capillary 149 (H) 70 - 99 mg/dL  Aerobic/Anaerobic Culture (surgical/deep wound)     Status: None (Preliminary result)   Collection Time: 08/01/19  1:38 PM   Specimen: Abscess  Result Value Ref Range   Specimen Description ABSCESS LEFT CHEST WALL    Special Requests PATIENT ON FOLLOWING MAXIPINE ANCEF    Gram Stain      ABUNDANT WBC PRESENT, PREDOMINANTLY PMN ABUNDANT  GRAM POSITIVE COCCI    Culture ABUNDANT STAPHYLOCOCCUS AUREUS    Report Status PENDING    Organism ID, Bacteria STAPHYLOCOCCUS AUREUS       Susceptibility   Staphylococcus aureus - MIC*    CIPROFLOXACIN <=0.5 SENSITIVE Sensitive     ERYTHROMYCIN <=0.25 SENSITIVE Sensitive     GENTAMICIN <=0.5 SENSITIVE Sensitive     OXACILLIN <=0.25 SENSITIVE Sensitive     TETRACYCLINE <=1 SENSITIVE Sensitive     VANCOMYCIN 1 SENSITIVE Sensitive     TRIMETH/SULFA <=10 SENSITIVE Sensitive     CLINDAMYCIN <=0.25 SENSITIVE Sensitive     RIFAMPIN <=0.5 SENSITIVE Sensitive     Inducible Clindamycin Value in next row Sensitive      NEGATIVEPerformed at [facility], [address] [place], [place] [address]    * ABUNDANT STAPHYLOCOCCUS AUREUS  Glucose, capillary     Status: Abnormal   Collection Time: 08/01/19  2:19 PM  Result Value Ref Range   Glucose-Capillary 166 (H) 70 - 99 mg/dL  Glucose, capillary     Status: Abnormal   Collection Time: 08/01/19  3:23 PM  Result Value Ref Range   Glucose-Capillary 189 (H) 70 - 99 mg/dL  Glucose, capillary     Status: Abnormal   Collection Time: 08/01/19  4:39 PM  Result Value Ref Range   Glucose-Capillary 236 (H) 70 - 99 mg/dL  Glucose, capillary     Status: Abnormal   Collection Time: 08/01/19 11:02  PM  Result Value Ref Range   Glucose-Capillary 276 (H) 70 - 99 mg/dL  CBC     Status: Abnormal   Collection Time: 08/02/19  3:09 AM  Result Value Ref Range   WBC 13.4 (H) 4.0 - 10.5 K/uL   RBC 2.93 (L) 4.22 - 5.81 MIL/uL   Hemoglobin 9.1 (L) 13.0 - 17.0 g/dL   HCT 16.124.8 (L) 09.639.0 - 04.552.0 %   MCV 84.6 80.0 - 100.0 fL   MCH 31.1 26.0 - 34.0 pg   MCHC 36.7 (H) 30.0 - 36.0 g/dL   RDW 40.914.9 81.111.5 - 91.415.5 %   Platelets 336 150 - 400 K/uL   nRBC 0.0 0.0 - 0.2 %    Comment: Performed at [facility], [address] [place], [place] [id number]  Glucose, capillary     Status: Abnormal   Collection Time: 08/02/19  8:15 AM  Result Value Ref Range    Glucose-Capillary 257 (H) 70 - 99 mg/dL  Glucose, capillary     Status: Abnormal   Collection Time: 08/02/19 12:04 PM  Result Value Ref Range   Glucose-Capillary 276 (H) 70 - 99 mg/dL  Glucose, capillary     Status: Abnormal   Collection Time: 08/02/19  4:08 PM  Result Value Ref Range   Glucose-Capillary 219 (H) 70 - 99 mg/dL  Glucose, capillary     Status: Abnormal   Collection Time: 08/02/19  7:21 PM  Result Value Ref Range   Glucose-Capillary 238 (H) 70 - 99 mg/dL  Glucose, capillary     Status: Abnormal   Collection Time: 08/02/19 10:02 PM  Result Value Ref Range   Glucose-Capillary 210 (H) 70 - 99 mg/dL   Comment 1 Notify RN    Comment 2 Document in Chart   Glucose, capillary     Status: Abnormal   Collection Time: 08/03/19  7:01 AM  Result Value Ref Range   Glucose-Capillary 203 (H) 70 - 99 mg/dL   Comment 1 Notify RN    Comment 2 Document in Chart     Dg Chest Port 1 View  Result Date: 08/02/2019 CLINICAL DATA:  Open wound of chest wall EXAM: PORTABLE CHEST 1 VIEW COMPARISON:  CT and radiograph 08/01/2019 FINDINGS: Small lucency along the upper left mediastinal border is seen in the region the previously identified chest wall collection compatible reported chest wall. Atelectatic changes are present in lungs including a bandlike area of subsegmental atelectasis in the left lung base. No pneumothorax or effusion. Central vascular crowding is present. Cardiomediastinal contours are otherwise. No other acute osseous or soft tissue abnormalities. IMPRESSION: 1. Irregular lucency along the upper left mediastinal border compatible with location of reported open chest wound. 2. Atelectatic changes in the lungs. Electronically Signed   By: Kreg ShropshirePrice  DeHay M.D.   On: 08/02/2019 06:39    Review of Systems  Constitutional: Negative for chills, fever and weight loss.  HENT: Negative for ear discharge, ear pain, hearing loss and tinnitus.   Eyes: Negative for blurred vision, double vision,  photophobia and pain.  Respiratory: Negative for cough, sputum production and shortness of breath.   Cardiovascular: Positive for chest pain.  Gastrointestinal: Negative for abdominal pain, nausea and vomiting.  Genitourinary: Negative for dysuria, flank pain, frequency and urgency.  Musculoskeletal: Positive for joint pain (Bilateral knees). Negative for back pain, falls, myalgias and neck pain.  Neurological: Negative for dizziness, tingling, sensory change, focal weakness, loss of consciousness and headaches.  Endo/Heme/Allergies: Does not bruise/bleed easily.  Psychiatric/Behavioral: Negative for depression, memory loss and substance abuse. The patient is not nervous/anxious.    Blood pressure (!) 155/87, pulse 74, temperature 99 F (37.2 C), temperature source Oral, resp. rate 17, height 6\' 2"  (1.88 m), weight 89.3 kg, SpO2 99 %. Physical Exam  Constitutional: He appears well-developed and well-nourished. No distress.  HENT:  Head: Normocephalic and atraumatic.  Eyes: Conjunctivae are normal. Right eye exhibits no discharge. Left eye exhibits no discharge. No scleral icterus.  Neck: Normal range of motion.  Cardiovascular: Normal rate and regular rhythm.  Respiratory: Effort normal. No respiratory distress.  Musculoskeletal:     Comments: RLE No traumatic wounds, ecchymosis, or rash  Superficial abscess medial heel, NT, no erythema  No knee or ankle effusion  Knee stable to varus/ valgus and anterior/posterior stress  Sens DPN, SPN, TN intact  Motor EHL, ext, flex, evers 5/5  DP 2+, PT 2+, No significant edema  Neurological: He is alert.  Skin: Skin is warm and dry. He is not diaphoretic.  Psychiatric: He has a normal mood and affect. His behavior is normal.      Assessment/Plan: Right heel abscess -- Will I&D, send for culture. IVDU  UPDATE: Procedure aborted 2/2 pt discomfort. This may be all he needs. If the abscess reaccumulates then may need more formal procedure.  Please call if this is the case.     Lisette Abu, PA-C Orthopedic Surgery [phone] 08/03/2019, 11:08 AM

## 2019-08-03 NOTE — Evaluation (Signed)
Physical Therapy Evaluation Patient Details Name: William [name] MRN: [id number] DOB: 04-11-1981 Today's Date: 08/03/2019   History of Present Illness  Patient is a 38 y/o male who presents with lower back pain, left shoulder pain, right foot and heel pain. Found to have right heel abscess s/p I&D 9/4, left upper chest wall abscess and mass s/p I&D and wound vac application 9/3, new onset DM, sepsis with MSSA bacteremia due to IV drug use. CT scan- left anterior chest wall soft tissue and intramuscular infection with extension into the thoracic cavity, air in the soft tissues of the right axilla adjacent to the right shoulder, multiple small pulmonary nodules. PMH- heroin IV drug use.  Clinical Impression  Patient presents with pain, generalized weakness and impaired mobility s/p above. Pt independent PTA and lives with his gf. Today, pt requires Mod-Max A for bed mobility and standing transfers due to pain in bil knees and weakness. Tolerated gait training with Min A for balance/safety. Lethargic initially but this improved with getting OOB. Pt will likely be in the hospital for antibiotics for the next 8 weeks. Will follow acutely to maximize independence and mobility prior to return home.     Follow Up Recommendations Home health PT;Supervision for mobility/OOB    Equipment Recommendations  Rolling walker with 5" wheels    Recommendations for Other Services       Precautions / Restrictions Precautions Precautions: Fall Precaution Comments: bil knee pain left>right; wound vac Restrictions Weight Bearing Restrictions: No      Mobility  Bed Mobility Overal bed mobility: Needs Assistance Bed Mobility: Rolling;Sidelying to Sit;Sit to Sidelying Rolling: Mod assist Sidelying to sit: Mod assist;HOB elevated     Sit to sidelying: Min guard;HOB elevated General bed mobility comments: Cues for log roll rechnique; use of rail, assist to roll trunk/hips with pad and to elevate. Able to bring  LEs into bed to return to supine.  Transfers Overall transfer level: Needs assistance Equipment used: Rolling walker (2 wheeled) Transfers: Sit to/from Stand Sit to Stand: Max assist;From elevated surface         General transfer comment: Assist to power to standing with cues for hand placement/technique. Difficulty in mid range andincreased pain in bil knees to ascend/descend.  Ambulation/Gait Ambulation/Gait assistance: Min assist Gait Distance (Feet): 100 Feet Assistive device: Rolling walker (2 wheeled) Gait Pattern/deviations: Trunk flexed;Step-through pattern;Decreased stride length;Decreased dorsiflexion - right Gait velocity: decreased   General Gait Details: Slow, mildly unsteady gait with increased WB through BUEs for support; toe walking on right towards end of gait due to pain in heel.  Stairs            Wheelchair Mobility    Modified Rankin (Stroke Patients Only)       Balance Overall balance assessment: Needs assistance Sitting-balance support: Feet supported;Bilateral upper extremity supported Sitting balance-Leahy Scale: Fair Sitting balance - Comments: Requires BUE support as position of comfort.   Standing balance support: During functional activity Standing balance-Leahy Scale: Poor Standing balance comment: Reliant on BUEs for support in standing.                             Pertinent Vitals/Pain Pain Assessment: Faces Faces Pain Scale: Hurts whole lot Pain Location: left knee, right ankle Pain Descriptors / Indicators: Sore;Guarding;Discomfort;Grimacing Pain Intervention(s): Repositioned;Monitored during session;Limited activity within patient's tolerance    Home Living Family/patient expects to be discharged to:: Private residence Living Arrangements: Non-relatives/Friends(g/f) Available Help  at Discharge: Friend(s);Available PRN/intermittently Type of Home: House Home Access: Stairs to enter Entrance Stairs-Rails:  Right Entrance Stairs-Number of Steps: 2 Home Layout: Two level;Able to live on main level with bedroom/bathroom Home Equipment: None      Prior Function Level of Independence: Independent         Comments: 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     Hand Dominance        Extremity/Trunk Assessment   Upper Extremity Assessment Upper Extremity Assessment: Defer to OT evaluation    Lower Extremity Assessment Lower Extremity Assessment: LLE deficits/detail;Generalized weakness LLE Deficits / Details: Grossly ~3/5 knee extension, quivering upon ascent/descent.       Communication   Communication: No difficulties  Cognition Arousal/Alertness: Lethargic Behavior During Therapy: Flat affect Overall Cognitive Status: Impaired/Different from baseline Area of Impairment: Attention;Following commands;Problem solving                   Current Attention Level: Sustained;Focused   Following Commands: Follows one step commands with increased time     Problem Solving: Slow processing;Decreased initiation;Requires verbal cues General Comments: Lethargic initially with focused attention but this improved to sustained once pt upright.      General Comments General comments (skin integrity, edema, etc.): Girlfriend present during session.    Exercises     Assessment/Plan    PT Assessment Patient needs continued PT services  PT Problem List Decreased strength;Pain;Decreased balance;Decreased knowledge of use of DME;Decreased activity tolerance;Decreased cognition;Decreased skin integrity;Decreased range of motion       PT Treatment Interventions Therapeutic activities;Gait training;Therapeutic exercise;Patient/family education;Balance training;Neuromuscular re-education;Functional mobility training;Stair training;DME instruction;Cognitive remediation    PT Goals (Current goals can be found in the Care Plan section)  Acute Rehab PT Goals Patient  Stated Goal: to get up and move PT Goal Formulation: With patient Time For Goal Achievement: 08/17/19 Potential to Achieve Goals: Good    Frequency Min 3X/week   Barriers to discharge        Co-evaluation               AM-PAC PT "6 Clicks" Mobility  Outcome Measure Help needed turning from your back to your side while in a flat bed without using bedrails?: A Lot Help needed moving from lying on your back to sitting on the side of a flat bed without using bedrails?: A Lot Help needed moving to and from a bed to a chair (including a wheelchair)?: A Lot Help needed standing up from a chair using your arms (e.g., wheelchair or bedside chair)?: Total Help needed to walk in hospital room?: A Little Help needed climbing 3-5 steps with a railing? : A Lot 6 Click Score: 12    End of Session Equipment Utilized During Treatment: Gait belt Activity Tolerance: Patient limited by pain;Patient tolerated treatment well Patient left: in bed;with call bell/phone within reach;with bed alarm set;with family/visitor present;Other (comment)(MD present) Nurse Communication: Mobility status PT Visit Diagnosis: Pain;Unsteadiness on feet (R26.81);Muscle weakness (generalized) (M62.81);Difficulty in walking, not elsewhere classified (R26.2) Pain - Right/Left: (bil) Pain - part of body: Knee    Time: 1340-1405 PT Time Calculation (min) (ACUTE ONLY): 25 min   Charges:   PT Evaluation $PT Eval Moderate Complexity: 1 Mod PT Treatments $Gait Training: 8-22 mins        Wray Kearns, PT, DPT Acute Rehabilitation Services Pager [phone] Office [place] 08/03/2019, 2:26 PM

## 2019-08-03 NOTE — Progress Notes (Signed)
Pt admitted to the unit as a transfer from [place]. Pt alert and verbally responsive; wound vac to left chest clean, dry and intact; suction on with no clamping noted; pt oriented to the unit and room; fall/safety precaution and prevention education completed. VSS; telemetry applied and verified with CCMD: per protocol. Pt in bed with call light within reach. Will continue to closely monitor pt. Delia Heady RN   08/02/19 2128  Vital Signs  BP (!) 162/83  BP Location Left Arm  Patient Position (if appropriate) Lying  BP Method Automatic  Pulse Rate 86  Pulse Rate Source Monitor  Resp 18  Temp 98.4 F (36.9 C)  Temp Source Oral  Oxygen Therapy  SpO2 97 %  O2 Device Room Air

## 2019-08-03 NOTE — Progress Notes (Signed)
[facility] DAILY ICU PROGRESS NOTE                   [address]            [place] [address]          [phone]   2 Days Post-Op Procedure(s) (LRB): I&D Left Chest wall abscess with application of wound vac (Left) TRANSESOPHAGEAL ECHOCARDIOGRAM (TEE) (N/A)  Total Length of Stay:  LOS: 2 days   Subjective: Now transferred to 3w by ccm, right ankle still red, painful Some pain over right [place] joint  Objective: Vital signs in last 24 hours: Temp:  [98 F (36.7 C)-100.2 F (37.9 C)] 99 F (37.2 C) (09/04 0700) Pulse Rate:  [71-102] 74 (09/04 0700) Cardiac Rhythm: Normal sinus rhythm (09/04 0700) Resp:  [17-30] 17 (09/04 0700) BP: (147-175)/(78-95) 155/87 (09/04 0700) SpO2:  [95 %-100 %] 99 % (09/04 0700) Weight:  [89.3 kg] 89.3 kg (09/04 0500)  Filed Weights   07/31/19 2254 08/01/19 0415 08/03/19 0500  Weight: 88 kg 84.8 kg 89.3 kg    Weight change:    Hemodynamic parameters for last 24 hours:    Intake/Output from previous day: 09/03 0701 - 09/04 0700 In: 3873.8 [I.V.:3174.6; IV Piggyback:699.3] Out: 5350 [Urine:5330; Emesis/NG output:20]  Intake/Output this shift: No intake/output data recorded.  Current Meds: Scheduled Meds: . Chlorhexidine Gluconate Cloth  6 each Topical Daily  . cyclobenzaprine  5 mg Oral TID  . heparin  5,000 Units Subcutaneous Q8H  . insulin aspart  0-15 Units Subcutaneous TID WC  . insulin aspart  0-5 Units Subcutaneous QHS  . insulin glargine  14 Units Subcutaneous Daily  . metroNIDAZOLE  500 mg Oral Q8H  . nystatin  5 mL Mouth/Throat QID  . ondansetron  4 mg Intravenous Once  . ramelteon  8 mg Oral QHS  . senna-docusate  1 tablet Oral BID   Continuous Infusions: .  ceFAZolin (ANCEF) IV Stopped (08/03/19 9147)   PRN Meds:.HYDROmorphone (DILAUDID) injection, ondansetron (ZOFRAN) IV, oxyCODONE, promethazine  General appearance: alert, cooperative and mild distress Neurologic: intact Heart: regular rate and rhythm,  S1, S2 normal, no murmur, click, rub or gallop Lungs: diminished breath sounds bibasilar Abdomen: soft, non-tender; bowel sounds normal; no masses,  no organomegaly Extremities: erythymis with pus right medial ankle joint Wound: wound vac changed left [place] joint , improving with some granulation no frank pus Right [place] joint - mildly tender but not red or swollen  Lab Results: CBC: Recent Labs    08/01/19 0929 08/02/19 0309  WBC 13.7* 13.4*  HGB 8.9* 9.1*  HCT 25.8* 24.8*  PLT 291 336   BMET:  Recent Labs    07/31/19 2302 08/01/19 0909 08/01/19 0929  NA 120* 133* 134*  K 6.0* 4.1 4.5  CL 79*  --  97*  CO2 27  --  26  GLUCOSE 701*  --  179*  BUN 53*  --  39*  CREATININE 1.62*  --  1.25*  CALCIUM 8.4*  --  8.0*    CMET: Lab Results  Component Value Date   WBC 13.4 (H) 08/02/2019   HGB 9.1 (L) 08/02/2019   HCT 24.8 (L) 08/02/2019   PLT 336 08/02/2019   GLUCOSE 179 (H) 08/01/2019   ALT 18 08/01/2019   AST 20 08/01/2019   NA 134 (L) 08/01/2019   K 4.5 08/01/2019   CL 97 (L) 08/01/2019   CREATININE 1.25 (H) 08/01/2019   BUN 39 (H) 08/01/2019  CO2 26 08/01/2019   INR 1.3 (H) 08/01/2019   HGBA1C 7.8 (H) 08/01/2019      PT/INR:  Recent Labs    08/01/19 0929  LABPROT 16.0*  INR 1.3*   Radiology: Dg Chest Port 1 View  Result Date: 08/02/2019 CLINICAL DATA:  Open wound of chest wall EXAM: PORTABLE CHEST 1 VIEW COMPARISON:  CT and radiograph 08/01/2019 FINDINGS: Small lucency along the upper left mediastinal border is seen in the region the previously identified chest wall collection compatible reported chest wall. Atelectatic changes are present in lungs including a bandlike area of subsegmental atelectasis in the left lung base. No pneumothorax or effusion. Central vascular crowding is present. Cardiomediastinal contours are otherwise. No other acute osseous or soft tissue abnormalities. IMPRESSION: 1. Irregular lucency along the upper left mediastinal border  compatible with location of reported open chest wound. 2. Atelectatic changes in the lungs. Electronically Signed   By: Kreg ShropshirePrice  DeHay M.D.   On: 08/02/2019 06:39  I have independently reviewed the above radiology studies  and reviewed the findings with the patient.  Assessment/Plan: S/P Procedure(s) (LRB): I&D Left Chest wall abscess with application of wound vac (Left) TRANSESOPHAGEAL ECHOCARDIOGRAM (TEE) (N/A) Mobilize Control dm TEE done in OR- see or note  Wound vac chest wall changed , continue mwf Monitor right [place] joint for involvement ? orth and ID following - pus from right ankle draining     Delight Ovensdward B Lashaun Poch 08/03/2019 9:51 AM

## 2019-08-03 NOTE — Progress Notes (Addendum)
  NEUROSURGERY PROGRESS NOTE   No issues overnight.  Back pain improved compared to yesterday. Denies radicular symptoms No change in motor   EXAM:  BP (!) 151/78 (BP Location: Left Arm)   Pulse 93   Temp 100.2 F (37.9 C) (Oral)   Resp 18   Ht 6\' 2"  (1.88 m)   Wt 89.3 kg   SpO2 98%   BMI 25.28 kg/m   Awake, alert, oriented   Speech fluent, appropriate  CN grossly intact  5/5 BUE/BLE   PLAN Stable neurologically. No plan for NS intervention. Can repeat MRI L-spine w/w/o Gad in a week. Continue tx per primary team. No new NS recs. Will sign off Rec continued neuro checks q 4 hours. Please call for any concerns.

## 2019-08-03 NOTE — Consult Note (Signed)
[place] Nurse wound consult note Reason for Consult:Wound Vac to left chest wall abscess/I & D site.  Dr Servando Snare at bedside to assess.  Wound type:infectious Pressure Injury POA: NA Measurement: 7 cm x 4 cm x 4 cm (deepest segment at 9:00, extends 2 cm tunnel) Wound bed: Beefy red, pale nongranulating tissue along superior margin. Drainage (amount, consistency, odor) moderate serosanguinous in canister.  NO purulence noted.  Periwound:intact Dressing procedure/placement/frequency:Cleanse wound with NS and pat dry.  Fill deadspace with 1 piece black foam, noting the tunneling at 9 :00.  Cover with drape.  Seal immediately achieved.   Will not follow at this time.  Please re-consult if needed.  Domenic Moras MSN, RN, FNP-BC CWON Wound, Ostomy, Continence Nurse Pager [phone]

## 2019-08-03 NOTE — Progress Notes (Signed)
PROGRESS NOTE    William [name]  AST:[id number] DOB: May 23, 1981 DOA: 07/31/2019 PCP: Patient, No Pcp Per    Brief Narrative:  38 year old no previous medical history, heroin injection for last 10 years presented to emergency room on 9/1 with multiple complaints including lower back pain, left shoulder pain, right foot and heel pain.  In the emergency room, he has right heel abscess, left upper chest wall abscess and mass, CT scan noted left anterior chest wall soft tissue and intramuscular infection with extension into the thoracic cavity, air in the soft tissues of the right axilla adjacent to the right shoulder, multiple small pulmonary nodules.  Patient was admitted to ICU.  Underwent surgical debridement of the chest wall abscess and wound VAC placement.  Also found to have new diagnosis of diabetes.  MRI showed multiple epidural and paraspinal abscesses. Blood cultures 9/2: MSSA Blood cultures 9/3: Pending Wound culture 9/2: GPC  Assessment & Plan:   Principal Problem:   Sepsis ([place]) Active Problems:   Abscess of paraspinous muscles   Osteomyelitis of lumbar spine ([place])   Epidural abscess of spine due to infective embolism   Abnormal chest CT   Septic embolism ([place])   Community acquired pneumonia   IV drug abuse ([place])   Heroin abuse ([place])   Elevated troponin   UTI (urinary tract infection)   Renal failure   Hyperglycemia   Suspected endocarditis   Staphylococcus aureus bacteremia   Chest wall abscess  Sepsis present on admission with MSSA bacteremia due to IV drug use: Septic pulmonary emboli, Left chest wall abscess, Right heel abscess, Extensive right posterolateral epidural abscess L2-L3, Osteomyelitis of the L5-S1, Posterior epidural abscess L5-S1 S2. Status post incision and drainage and debridement left chest wall on wound VAC, followed by CT CS Bedside I&D right heel, cultures pending, local dressing Multiple epidural abscess and discitis, seen by neurosurgery  recommended no surgical drainage but antibiotics Followed by infectious disease currently remains on cefazolin and metronidazole.  Repeat cultures pending. Intraoperative TEE with no evidence of endocarditis. Anticipate intravenous antibiotics for about 6 to 8 weeks.  IV drug use: Ongoing opiate use.  Patient is motivated to quit.  Will provide symptomatic treatment.  Opiates for pain relief for postoperative pain.  Will use Tylenol and muscle relaxants.  New onset diabetes: A1c 7.8.  Started on insulin.  Will continue insulin until acute issues and infection improved.  Ultimately on discharge, he can go back on oral hypoglycemics.   DVT prophylaxis: Heparin subcu Code Status: Full code Family Communication: None Disposition Plan: Inpatient   Consultants:   PCCM, transfer out  CT CS, following  Neurosurgery, signed off  Orthopedics, signed out  Procedures:   Chest wall abscess incision and drainage, 08/02/2019  Right heel incision and drainage, 08/03/2019  Antimicrobials:   Vancomycin, 08/01/2019>>> 08/02/2019  Cefepime, 08/01/2019>>> 08/02/2019  Cefazolin, 08/02/2019>>>>>  Flagyl, 08/02/2019>>>  Diflucan, on 08/03/2019 dose  Nystatin swish and swallow, 08/03/2019   Subjective: Patient seen and examined.  Detailed history reviewed with the patient.  He had multiple questions.  No overnight events.  Minimal pain on the left chest wall.  He was wondering why he is not getting heroin withdrawal.  Call orthopedics and they did a bedside I&D.  Objective: Vitals:   08/03/19 0438 08/03/19 0500 08/03/19 0700 08/03/19 1100  BP: (!) 151/78  (!) 155/87 (!) 142/87  Pulse: 93  74 73  Resp: 18  17 17   Temp: 100.2 F (37.9 C)  99 F (  37.2 C) 98.8 F (37.1 C)  TempSrc: Oral  Oral Oral  SpO2: 98%  99% 99%  Weight:  89.3 kg    Height:        Intake/Output Summary (Last 24 hours) at 08/03/2019 1321 Last data filed at 08/03/2019 1100 Gross per 24 hour  Intake 2887.66 ml  Output 4830 ml   Net -1942.34 ml   Filed Weights   07/31/19 2254 08/01/19 0415 08/03/19 0500  Weight: 88 kg 84.8 kg 89.3 kg    Examination:  General exam: Appears calm and comfortable, not in any distress. Respiratory system: Clear to auscultation. Respiratory effort normal. Cardiovascular system: S1 & S2 heard, RRR. No JVD, murmurs, rubs, gallops or clicks. No pedal edema. Left anterior chest wall wound with wound VAC, draining. Gastrointestinal system: Abdomen is nondistended, soft and nontender. No organomegaly or masses felt. Normal bowel sounds heard. Central nervous system: Alert and oriented. No focal neurological deficits. Extremities: Symmetric 5 x 5 power.  No sensory deficits. Skin: No rashes, lesions or ulcers Right medial heel with a small bulla filled with pus. Psychiatry: Judgement and insight appear normal. Mood & affect appropriate.     Data Reviewed: I have personally reviewed following labs and imaging studies  CBC: Recent Labs  Lab 07/31/19 2302 08/01/19 0909 08/01/19 0929 08/02/19 0309  WBC 19.3*  --  13.7* 13.4*  NEUTROABS 18.5*  --   --   --   HGB 11.1* 9.5* 8.9* 9.1*  HCT 32.3* 28.0* 25.8* 24.8*  MCV 88.7  --  87.2 84.6  PLT 347  --  291 336   Basic Metabolic Panel: Recent Labs  Lab 07/31/19 2302 08/01/19 0909 08/01/19 0929  NA 120* 133* 134*  K 6.0* 4.1 4.5  CL 79*  --  97*  CO2 27  --  26  GLUCOSE 701*  --  179*  BUN 53*  --  39*  CREATININE 1.62*  --  1.25*  CALCIUM 8.4*  --  8.0*  MG  --   --  2.1  PHOS  --   --  3.5   GFR: Estimated Creatinine Clearance: 93.2 mL/min (A) (by C-G formula based on SCr of 1.25 mg/dL (H)). Liver Function Tests: Recent Labs  Lab 07/31/19 2302 08/01/19 0929  AST 19 20  ALT 25 18  ALKPHOS 276* 178*  BILITOT 2.7* 2.6*  PROT 6.9 5.3*  ALBUMIN 1.7* 1.4*   No results for input(s): LIPASE, AMYLASE in the last 168 hours. No results for input(s): AMMONIA in the last 168 hours. Coagulation Profile: Recent Labs   Lab 07/31/19 2302 08/01/19 0929  INR 1.3* 1.3*   Cardiac Enzymes: Recent Labs  Lab 07/31/19 2302  CKTOTAL 71   BNP (last 3 results) No results for input(s): PROBNP in the last 8760 hours. HbA1C: Recent Labs    08/01/19 0929  HGBA1C 7.8*   CBG: Recent Labs  Lab 08/02/19 1608 08/02/19 1921 08/02/19 2202 08/03/19 0701 08/03/19 1156  GLUCAP 219* 238* 210* 203* 236*   Lipid Profile: No results for input(s): CHOL, HDL, LDLCALC, TRIG, CHOLHDL, LDLDIRECT in the last 72 hours. Thyroid Function Tests: No results for input(s): TSH, T4TOTAL, FREET4, T3FREE, THYROIDAB in the last 72 hours. Anemia Panel: No results for input(s): VITAMINB12, FOLATE, FERRITIN, TIBC, IRON, RETICCTPCT in the last 72 hours. Sepsis Labs: Recent Labs  Lab 07/31/19 2302 08/01/19 0058  LATICACIDVEN 3.7* 3.5*    Recent Results (from the past 240 hour(s))  Urine culture     Status:  Abnormal   Collection Time: 07/31/19 10:58 PM   Specimen: In/Out Cath Urine  Result Value Ref Range Status   Specimen Description IN/OUT CATH URINE  Final   Special Requests   Final    NONE Performed at [facility], [address], [place], [place] [address]    Culture >=100,000 COLONIES/mL STAPHYLOCOCCUS AUREUS (A)  Final   Report Status 08/02/2019 FINAL  Final   Organism ID, Bacteria STAPHYLOCOCCUS AUREUS (A)  Final      Susceptibility   Staphylococcus aureus - MIC*    CIPROFLOXACIN 1 SENSITIVE Sensitive     GENTAMICIN <=0.5 SENSITIVE Sensitive     NITROFURANTOIN <=16 SENSITIVE Sensitive     OXACILLIN 0.5 SENSITIVE Sensitive     TETRACYCLINE <=1 SENSITIVE Sensitive     VANCOMYCIN <=0.5 SENSITIVE Sensitive     TRIMETH/SULFA <=10 SENSITIVE Sensitive     CLINDAMYCIN <=0.25 SENSITIVE Sensitive     RIFAMPIN <=0.5 SENSITIVE Sensitive     Inducible Clindamycin NEGATIVE Sensitive     * >=100,000 COLONIES/mL STAPHYLOCOCCUS AUREUS  Blood culture (routine x 2)     Status: Abnormal   Collection Time: 08/01/19   1:36 AM   Specimen: BLOOD  Result Value Ref Range Status   Specimen Description BLOOD RIGHT ANTECUBITAL  Final   Special Requests   Final    BOTTLES DRAWN AEROBIC AND ANAEROBIC Blood Culture adequate volume   Culture  Setup Time   Final    GRAM POSITIVE COCCI IN BOTH AEROBIC AND ANAEROBIC BOTTLES CRITICAL RESULT CALLED TO, READ BACK BY AND VERIFIED WITH: Ihor Austin PharmD 17:20 08/01/19 ([email or web address]) Performed at [facility], [address], [place], [place] [address]    Culture STAPHYLOCOCCUS AUREUS (A)  Final   Report Status 08/03/2019 FINAL  Final   Organism ID, Bacteria STAPHYLOCOCCUS AUREUS  Final      Susceptibility   Staphylococcus aureus - MIC*    CIPROFLOXACIN <=0.5 SENSITIVE Sensitive     ERYTHROMYCIN <=0.25 SENSITIVE Sensitive     GENTAMICIN <=0.5 SENSITIVE Sensitive     OXACILLIN <=0.25 SENSITIVE Sensitive     TETRACYCLINE <=1 SENSITIVE Sensitive     VANCOMYCIN 1 SENSITIVE Sensitive     TRIMETH/SULFA <=10 SENSITIVE Sensitive     CLINDAMYCIN <=0.25 SENSITIVE Sensitive     RIFAMPIN <=0.5 SENSITIVE Sensitive     Inducible Clindamycin NEGATIVE Sensitive     * STAPHYLOCOCCUS AUREUS  Blood Culture ID Panel (Reflexed)     Status: Abnormal   Collection Time: 08/01/19  1:36 AM  Result Value Ref Range Status   Enterococcus species NOT DETECTED NOT DETECTED Final   Listeria monocytogenes NOT DETECTED NOT DETECTED Final   Staphylococcus species DETECTED (A) NOT DETECTED Final    Comment: CRITICAL RESULT CALLED TO, READ BACK BY AND VERIFIED WITH: Ihor Austin PharmD 17:20 08/01/19 ([email or web address])    Staphylococcus aureus (BCID) DETECTED (A) NOT DETECTED Final    Comment: Methicillin (oxacillin) susceptible Staphylococcus aureus (MSSA). Preferred therapy is anti staphylococcal beta lactam antibiotic (Cefazolin or Nafcillin), unless clinically contraindicated. CRITICAL RESULT CALLED TO, READ BACK BY AND VERIFIED WITH: Ihor Austin PharmD 17:20 08/01/19 ([email or web address])    Methicillin resistance NOT  DETECTED NOT DETECTED Final   Streptococcus species NOT DETECTED NOT DETECTED Final   Streptococcus agalactiae NOT DETECTED NOT DETECTED Final   Streptococcus pneumoniae NOT DETECTED NOT DETECTED Final   Streptococcus pyogenes NOT DETECTED NOT DETECTED Final   Acinetobacter baumannii NOT DETECTED NOT DETECTED Final   Enterobacteriaceae species NOT  DETECTED NOT DETECTED Final   Enterobacter cloacae complex NOT DETECTED NOT DETECTED Final   Escherichia coli NOT DETECTED NOT DETECTED Final   Klebsiella oxytoca NOT DETECTED NOT DETECTED Final   Klebsiella pneumoniae NOT DETECTED NOT DETECTED Final   Proteus species NOT DETECTED NOT DETECTED Final   Serratia marcescens NOT DETECTED NOT DETECTED Final   Haemophilus influenzae NOT DETECTED NOT DETECTED Final   Neisseria meningitidis NOT DETECTED NOT DETECTED Final   Pseudomonas aeruginosa NOT DETECTED NOT DETECTED Final   Candida albicans NOT DETECTED NOT DETECTED Final   Candida glabrata NOT DETECTED NOT DETECTED Final   Candida krusei NOT DETECTED NOT DETECTED Final   Candida parapsilosis NOT DETECTED NOT DETECTED Final   Candida tropicalis NOT DETECTED NOT DETECTED Final    Comment: Performed at [facility], [address] [place], [place] [address]  SARS Coronavirus 2 [facility] order, Performed in [facility] hospital lab) Nasopharyngeal Abscess     Status: None   Collection Time: 08/01/19  2:10 AM   Specimen: Abscess; Nasopharyngeal  Result Value Ref Range Status   SARS Coronavirus 2 NEGATIVE NEGATIVE Final    Comment: (NOTE) If result is NEGATIVE SARS-CoV-2 target nucleic acids are NOT DETECTED. The SARS-CoV-2 RNA is generally detectable in upper and lower  respiratory specimens during the acute phase of infection. The lowest  concentration of SARS-CoV-2 viral copies this assay can detect is 250  copies / mL. A negative result does not preclude SARS-CoV-2 infection  and should not be used as the sole basis for treatment or  other  patient management decisions.  A negative result may occur with  improper specimen collection / handling, submission of specimen other  than nasopharyngeal swab, presence of viral mutation(s) within the  areas targeted by this assay, and inadequate number of viral copies  (<250 copies / mL). A negative result must be combined with clinical  observations, patient history, and epidemiological information. If result is POSITIVE SARS-CoV-2 target nucleic acids are DETECTED. The SARS-CoV-2 RNA is generally detectable in upper and lower  respiratory specimens dur ing the acute phase of infection.  Positive  results are indicative of active infection with SARS-CoV-2.  Clinical  correlation with patient history and other diagnostic information is  necessary to determine patient infection status.  Positive results do  not rule out bacterial infection or co-infection with other viruses. If result is PRESUMPTIVE POSTIVE SARS-CoV-2 nucleic acids MAY BE PRESENT.   A presumptive positive result was obtained on the submitted specimen  and confirmed on repeat testing.  While 2019 novel coronavirus  (SARS-CoV-2) nucleic acids may be present in the submitted sample  additional confirmatory testing may be necessary for epidemiological  and / or clinical management purposes  to differentiate between  SARS-CoV-2 and other Sarbecovirus currently known to infect humans.  If clinically indicated additional testing with an alternate test  methodology [phone] is advised. The SARS-CoV-2 RNA is generally  detectable in upper and lower respiratory sp ecimens during the acute  phase of infection. The expected result is Negative. Fact Sheet for Patients:  BoilerBrush.com.cy Fact Sheet for Healthcare Providers: https://pope.com/ This test is not yet approved or cleared by the [place] FDA and has been authorized for detection and/or diagnosis of  SARS-CoV-2 by FDA under an Emergency Use Authorization (EUA).  This EUA will remain in effect (meaning this test can be used) for the duration of the COVID-19 declaration under Section 564(b)(1) of the Act, 21 U.S.C. section 360bbb-3(b)(1), unless  the authorization is terminated or revoked sooner. Performed at [facility], [address] [place], [place] [address]   Wound or Superficial Culture     Status: None   Collection Time: 08/01/19  2:10 AM   Specimen: Abscess; Wound  Result Value Ref Range Status   Specimen Description ABSCESS  Final   Special Requests NONE  Final   Gram Stain   Final    RARE WBC PRESENT, PREDOMINANTLY PMN MODERATE GRAM POSITIVE COCCI IN CLUSTERS Performed at [facility], [address] [place], [place] [address]    Culture ABUNDANT STAPHYLOCOCCUS AUREUS  Final   Report Status 08/03/2019 FINAL  Final   Organism ID, Bacteria STAPHYLOCOCCUS AUREUS  Final      Susceptibility   Staphylococcus aureus - MIC*    CIPROFLOXACIN <=0.5 SENSITIVE Sensitive     ERYTHROMYCIN <=0.25 SENSITIVE Sensitive     GENTAMICIN <=0.5 SENSITIVE Sensitive     OXACILLIN <=0.25 SENSITIVE Sensitive     TETRACYCLINE <=1 SENSITIVE Sensitive     VANCOMYCIN <=0.5 SENSITIVE Sensitive     TRIMETH/SULFA <=10 SENSITIVE Sensitive     CLINDAMYCIN <=0.25 SENSITIVE Sensitive     RIFAMPIN <=0.5 SENSITIVE Sensitive     Inducible Clindamycin NEGATIVE Sensitive     * ABUNDANT STAPHYLOCOCCUS AUREUS  Blood culture (routine x 2)     Status: Abnormal   Collection Time: 08/01/19  2:40 AM   Specimen: BLOOD LEFT ARM  Result Value Ref Range Status   Specimen Description BLOOD LEFT ARM  Final   Special Requests   Final    BOTTLES DRAWN AEROBIC AND ANAEROBIC Blood Culture adequate volume   Culture  Setup Time   Final    GRAM POSITIVE COCCI IN BOTH AEROBIC AND ANAEROBIC BOTTLES CRITICAL VALUE NOTED.  VALUE IS CONSISTENT WITH PREVIOUSLY REPORTED AND CALLED VALUE.    Culture (A)  Final     STAPHYLOCOCCUS AUREUS SUSCEPTIBILITIES PERFORMED ON PREVIOUS CULTURE WITHIN THE LAST 5 DAYS. Performed at [facility], [address] [place], [place] [address]    Report Status 08/03/2019 FINAL  Final  MRSA PCR Screening     Status: None   Collection Time: 08/01/19  7:28 AM   Specimen: Nasal Mucosa; Nasopharyngeal  Result Value Ref Range Status   MRSA by PCR NEGATIVE NEGATIVE Final    Comment:        The GeneXpert MRSA Assay (FDA approved for NASAL specimens only), is one component of a comprehensive MRSA colonization surveillance program. It is not intended to diagnose MRSA infection nor to guide or monitor treatment for MRSA infections. Performed at [facility], [address] [place], [place] [address]   Aerobic/Anaerobic Culture (surgical/deep wound)     Status: None (Preliminary result)   Collection Time: 08/01/19  1:38 PM   Specimen: Abscess  Result Value Ref Range Status   Specimen Description ABSCESS LEFT CHEST WALL  Final   Special Requests PATIENT ON FOLLOWING MAXIPINE ANCEF  Final   Gram Stain   Final    ABUNDANT WBC PRESENT, PREDOMINANTLY PMN ABUNDANT GRAM POSITIVE COCCI Performed at [facility], [address] [place], [place] [address]    Culture   Final    ABUNDANT STAPHYLOCOCCUS AUREUS NO ANAEROBES ISOLATED; CULTURE IN PROGRESS FOR 5 DAYS    Report Status PENDING  Incomplete   Organism ID, Bacteria STAPHYLOCOCCUS AUREUS  Final      Susceptibility   Staphylococcus aureus - MIC*  CIPROFLOXACIN <=0.5 SENSITIVE Sensitive     ERYTHROMYCIN <=0.25 SENSITIVE Sensitive     GENTAMICIN <=0.5 SENSITIVE Sensitive     OXACILLIN <=0.25 SENSITIVE Sensitive     TETRACYCLINE <=1 SENSITIVE Sensitive     VANCOMYCIN 1 SENSITIVE Sensitive     TRIMETH/SULFA <=10 SENSITIVE Sensitive     CLINDAMYCIN <=0.25 SENSITIVE Sensitive     RIFAMPIN <=0.5 SENSITIVE Sensitive     Inducible Clindamycin NEGATIVE Sensitive     * ABUNDANT STAPHYLOCOCCUS AUREUS   Culture, blood (routine x 2)     Status: None (Preliminary result)   Collection Time: 08/02/19 10:26 AM   Specimen: BLOOD RIGHT ARM  Result Value Ref Range Status   Specimen Description BLOOD RIGHT ARM  Final   Special Requests   Final    BOTTLES DRAWN AEROBIC AND ANAEROBIC Blood Culture adequate volume   Culture   Final    NO GROWTH 1 DAY Performed at [facility], [address] [place], [place] [address]    Report Status PENDING  Incomplete  Culture, blood (routine x 2)     Status: None (Preliminary result)   Collection Time: 08/02/19 10:26 AM   Specimen: BLOOD LEFT ARM  Result Value Ref Range Status   Specimen Description BLOOD LEFT ARM  Final   Special Requests   Final    BOTTLES DRAWN AEROBIC ONLY Blood Culture adequate volume   Culture   Final    NO GROWTH 1 DAY Performed at [facility], [address] [place], [place] [address]    Report Status PENDING  Incomplete         Radiology Studies: Dg Chest Port 1 View  Result Date: 08/02/2019 CLINICAL DATA:  Open wound of chest wall EXAM: PORTABLE CHEST 1 VIEW COMPARISON:  CT and radiograph 08/01/2019 FINDINGS: Small lucency along the upper left mediastinal border is seen in the region the previously identified chest wall collection compatible reported chest wall. Atelectatic changes are present in lungs including a bandlike area of subsegmental atelectasis in the left lung base. No pneumothorax or effusion. Central vascular crowding is present. Cardiomediastinal contours are otherwise. No other acute osseous or soft tissue abnormalities. IMPRESSION: 1. Irregular lucency along the upper left mediastinal border compatible with location of reported open chest wound. 2. Atelectatic changes in the lungs. Electronically Signed   By: Kreg ShropshirePrice  DeHay M.D.   On: 08/02/2019 06:39        Scheduled Meds: . Chlorhexidine Gluconate Cloth  6 each Topical Daily  . cyclobenzaprine  5 mg Oral TID  . heparin  5,000 Units  Subcutaneous Q8H  . insulin aspart  0-15 Units Subcutaneous TID WC  . insulin aspart  0-5 Units Subcutaneous QHS  . insulin glargine  14 Units Subcutaneous Daily  . metroNIDAZOLE  500 mg Oral Q8H  . nystatin  5 mL Mouth/Throat QID  . ondansetron  4 mg Intravenous Once  . ramelteon  8 mg Oral QHS  . senna-docusate  1 tablet Oral BID   Continuous Infusions: .  ceFAZolin (ANCEF) IV Stopped (08/03/19 [id number])     LOS: 2 days    Time spent: 35 minutes    Dorcas CarrowKuber Maeve Debord, MD [facility] Pager [phone]  If 7PM-7AM, please contact night-coverage [email or web address] Password [email or web address] 08/03/2019, 1:21 PM

## 2019-08-03 NOTE — Progress Notes (Signed)
[facility]   Reason for visit: Follow up on MSSA sepsis, lumbar discitis, chest wall abscess  Antibiotics: Cefazolin, day 2 + 2 days of vanc/cefepime Flagyl, day 2  Interval History: Pt made efforts to ambulate with PT prior to my visit. He still has not told his GF about his heroin use. Appetite improving. Ortho came by yesterday and drained fluid from his RT heel. He still is struggling with pain from that joint. He c/o RT knee pain but he had full ROM and stood on his RT leg w/o pain from his knee (reported only heel pain with standing today). Fever curve, WBC & glycemic trends, prior serologies, imaging, and ABX usage all  independently reviewed    Current Facility-Administered Medications:  .  ceFAZolin (ANCEF) IVPB 2g/100 mL premix, 2 g, Intravenous, Q8H, Brita Jurgensen, Arley PhenixJames N, MD, Stopped at 08/03/19 [phone] .  Chlorhexidine Gluconate Cloth 2 % PADS 6 each, 6 each, Topical, Daily, Leary RocaRoddenberry, Myron G, PA-C, 6 each at 08/03/19 1034 .  cyclobenzaprine (FLEXERIL) tablet 5 mg, 5 mg, Oral, TID, Ghimire, Kuber, MD, 5 mg at 08/03/19 0917 .  heparin injection 5,000 Units, 5,000 Units, Subcutaneous, Q8H, Roddenberry, Cecille AmsterdamMyron G, PA-C, 5,000 Units at 08/03/19 0516 .  HYDROmorphone (DILAUDID) injection 1 mg, 1 mg, Intravenous, Q4H PRN, Leslye PeerByrum, Robert S, MD, 1 mg at 08/02/19 1133 .  insulin aspart (novoLOG) injection 0-15 Units, 0-15 Units, Subcutaneous, TID WC, Deterding, Dorise HissElizabeth C, MD, 5 Units at 08/03/19 1232 .  insulin aspart (novoLOG) injection 0-5 Units, 0-5 Units, Subcutaneous, QHS, Deterding, Dorise HissElizabeth C, MD, 2 Units at 08/02/19 2221 .  insulin glargine (LANTUS) injection 14 Units, 14 Units, Subcutaneous, Daily, Lorin GlassSmith, Daniel C, MD, 14 Units at 08/03/19 1132 .  metroNIDAZOLE (FLAGYL) tablet 500 mg, 500 mg, Oral, Q8H, Braelynn Lupton, Arley PhenixJames N, MD, 500 mg at 08/03/19 0516 .  nystatin (MYCOSTATIN) 100000 UNIT/ML suspension 500,000 Units, 5 mL, Mouth/Throat, QID, Dorcas CarrowGhimire, Kuber,  MD, 500,000 Units at 08/03/19 0917 .  ondansetron (ZOFRAN) injection 4 mg, 4 mg, Intravenous, Once, Roddenberry, Myron G, PA-C .  ondansetron (ZOFRAN) injection 4 mg, 4 mg, Intravenous, Q8H PRN, Deterding, Dorise HissElizabeth C, MD, 4 mg at 08/03/19 0516 .  oxyCODONE (Oxy IR/ROXICODONE) immediate release tablet 5 mg, 5 mg, Oral, Q6H PRN, Leslye PeerByrum, Robert S, MD, 5 mg at 08/03/19 0516 .  promethazine (PHENERGAN) injection 6.25 mg, 6.25 mg, Intravenous, Q6H PRN, Lorin GlassSmith, Daniel C, MD, 6.25 mg at 08/02/19 2229 .  ramelteon (ROZEREM) tablet 8 mg, 8 mg, Oral, QHS, Lorin GlassSmith, Daniel C, MD, 8 mg at 08/02/19 2223 .  senna-docusate (Senokot-S) tablet 1 tablet, 1 tablet, Oral, BID, Lorin GlassSmith, Daniel C, MD, 1 tablet at 08/03/19 0917   Physical Exam:   Vitals:   08/03/19 0700 08/03/19 1100  BP: (!) 155/87 (!) 142/87  Pulse: 74 73  Resp: 17 17  Temp: 99 F (37.2 C) 98.8 F (37.1 C)  SpO2: 99% 99%   Physical Exam Gen: anxious, moderate distress secondary to back and LT chest wall pain, A&Ox 3 Head: NCAT, no temporal wasting evident EENT: PERRL, EOMI, MMM, adequate dentition Neck: supple, mild JVD CV: NRRR, no murmurs evident Pulm: CTA bilaterally, mild expiratory wheeze , no retractions Abd: soft, NTND, +BS (hypoactive) MSK: TTP to lumbar spine, +RT heel wound w/o drainage, LT chest wall wound VAC intact, RT knee w/ full ROM on active movement from the pt Extrems:  trace LE edema, 2+ pulses Skin: no rashes, adequate skin turgor Neuro: CN II-XII grossly  intact, no focal neurologic deficits appreciated, gait was not assessed, A&Ox 3 Psych: very anxious/easily agitated    Review of Systems:  Review of Systems  Constitutional: Positive for chills and fever. Negative for weight loss.  HENT: Negative for congestion, hearing loss, sinus pain and sore throat.   Eyes: Negative for blurred vision, photophobia and discharge.  Respiratory: Positive for shortness of breath. Negative for cough and hemoptysis.   Cardiovascular:  Positive for chest pain. Negative for palpitations, orthopnea and leg swelling.  Gastrointestinal: Positive for nausea. Negative for abdominal pain, constipation, diarrhea, heartburn and vomiting.  Genitourinary: Negative for dysuria, flank pain, frequency and urgency.  Musculoskeletal: Positive for back pain and joint pain. Negative for myalgias.       Back, RT heel, and LT chest wall pain all reported PTA  Skin: Negative for itching and rash.  Neurological: Negative for tremors, seizures, weakness and headaches.  Endo/Heme/Allergies: Negative for polydipsia. Does not bruise/bleed easily.  Psychiatric/Behavioral: Positive for substance abuse. Negative for depression. The patient is not nervous/anxious and does not have insomnia.      Lab Results  Component Value Date   WBC 13.4 (H) 08/02/2019   HGB 9.1 (L) 08/02/2019   HCT 24.8 (L) 08/02/2019   MCV 84.6 08/02/2019   PLT 336 08/02/2019    Lab Results  Component Value Date   CREATININE 1.25 (H) 08/01/2019   BUN 39 (H) 08/01/2019   NA 134 (L) 08/01/2019   K 4.5 08/01/2019   CL 97 (L) 08/01/2019   CO2 26 08/01/2019    Lab Results  Component Value Date   ALT 18 08/01/2019   AST 20 08/01/2019   ALKPHOS 178 (H) 08/01/2019     Microbiology: Recent Results (from the past 240 hour(s))  Urine culture     Status: Abnormal   Collection Time: 07/31/19 10:58 PM   Specimen: In/Out Cath Urine  Result Value Ref Range Status   Specimen Description IN/OUT CATH URINE  Final   Special Requests   Final    NONE Performed at [facility], [address] [place], [place] [address]    Culture >=100,000 COLONIES/mL STAPHYLOCOCCUS AUREUS (A)  Final   Report Status 08/02/2019 FINAL  Final   Organism ID, Bacteria STAPHYLOCOCCUS AUREUS (A)  Final      Susceptibility   Staphylococcus aureus - MIC*    CIPROFLOXACIN 1 SENSITIVE Sensitive     GENTAMICIN <=0.5 SENSITIVE Sensitive     NITROFURANTOIN <=16 SENSITIVE Sensitive     OXACILLIN 0.5  SENSITIVE Sensitive     TETRACYCLINE <=1 SENSITIVE Sensitive     VANCOMYCIN <=0.5 SENSITIVE Sensitive     TRIMETH/SULFA <=10 SENSITIVE Sensitive     CLINDAMYCIN <=0.25 SENSITIVE Sensitive     RIFAMPIN <=0.5 SENSITIVE Sensitive     Inducible Clindamycin NEGATIVE Sensitive     * >=100,000 COLONIES/mL STAPHYLOCOCCUS AUREUS  Blood culture (routine x 2)     Status: Abnormal   Collection Time: 08/01/19  1:36 AM   Specimen: BLOOD  Result Value Ref Range Status   Specimen Description BLOOD RIGHT ANTECUBITAL  Final   Special Requests   Final    BOTTLES DRAWN AEROBIC AND ANAEROBIC Blood Culture adequate volume   Culture  Setup Time   Final    GRAM POSITIVE COCCI IN BOTH AEROBIC AND ANAEROBIC BOTTLES CRITICAL RESULT CALLED TO, READ BACK BY AND VERIFIED WITH: Dion Body PharmD 17:20 08/01/19 ([email or web address]) Performed at [facility], [address] [place], [place]  [address]    Culture STAPHYLOCOCCUS AUREUS (A)  Final   Report Status 08/03/2019 FINAL  Final   Organism ID, Bacteria STAPHYLOCOCCUS AUREUS  Final      Susceptibility   Staphylococcus aureus - MIC*    CIPROFLOXACIN <=0.5 SENSITIVE Sensitive     ERYTHROMYCIN <=0.25 SENSITIVE Sensitive     GENTAMICIN <=0.5 SENSITIVE Sensitive     OXACILLIN <=0.25 SENSITIVE Sensitive     TETRACYCLINE <=1 SENSITIVE Sensitive     VANCOMYCIN 1 SENSITIVE Sensitive     TRIMETH/SULFA <=10 SENSITIVE Sensitive     CLINDAMYCIN <=0.25 SENSITIVE Sensitive     RIFAMPIN <=0.5 SENSITIVE Sensitive     Inducible Clindamycin NEGATIVE Sensitive     * STAPHYLOCOCCUS AUREUS  Blood Culture ID Panel (Reflexed)     Status: Abnormal   Collection Time: 08/01/19  1:36 AM  Result Value Ref Range Status   Enterococcus species NOT DETECTED NOT DETECTED Final   Listeria monocytogenes NOT DETECTED NOT DETECTED Final   Staphylococcus species DETECTED (A) NOT DETECTED Final    Comment: CRITICAL RESULT CALLED TO, READ BACK BY AND VERIFIED WITH: Ihor Austin PharmD 17:20 08/01/19  ([email or web address])    Staphylococcus aureus (BCID) DETECTED (A) NOT DETECTED Final    Comment: Methicillin (oxacillin) susceptible Staphylococcus aureus (MSSA). Preferred therapy is anti staphylococcal beta lactam antibiotic (Cefazolin or Nafcillin), unless clinically contraindicated. CRITICAL RESULT CALLED TO, READ BACK BY AND VERIFIED WITH: Ihor Austin PharmD 17:20 08/01/19 ([email or web address])    Methicillin resistance NOT DETECTED NOT DETECTED Final   Streptococcus species NOT DETECTED NOT DETECTED Final   Streptococcus agalactiae NOT DETECTED NOT DETECTED Final   Streptococcus pneumoniae NOT DETECTED NOT DETECTED Final   Streptococcus pyogenes NOT DETECTED NOT DETECTED Final   Acinetobacter baumannii NOT DETECTED NOT DETECTED Final   Enterobacteriaceae species NOT DETECTED NOT DETECTED Final   Enterobacter cloacae complex NOT DETECTED NOT DETECTED Final   Escherichia coli NOT DETECTED NOT DETECTED Final   Klebsiella oxytoca NOT DETECTED NOT DETECTED Final   Klebsiella pneumoniae NOT DETECTED NOT DETECTED Final   Proteus species NOT DETECTED NOT DETECTED Final   Serratia marcescens NOT DETECTED NOT DETECTED Final   Haemophilus influenzae NOT DETECTED NOT DETECTED Final   Neisseria meningitidis NOT DETECTED NOT DETECTED Final   Pseudomonas aeruginosa NOT DETECTED NOT DETECTED Final   Candida albicans NOT DETECTED NOT DETECTED Final   Candida glabrata NOT DETECTED NOT DETECTED Final   Candida krusei NOT DETECTED NOT DETECTED Final   Candida parapsilosis NOT DETECTED NOT DETECTED Final   Candida tropicalis NOT DETECTED NOT DETECTED Final    Comment: Performed at [facility], [address], [place], [place] [address]  SARS Coronavirus 2 [facility] order, Performed in [facility] hospital lab) Nasopharyngeal Abscess     Status: None   Collection Time: 08/01/19  2:10 AM   Specimen: Abscess; Nasopharyngeal  Result Value Ref Range Status   SARS Coronavirus 2 NEGATIVE NEGATIVE Final    Comment: (NOTE)  If result is NEGATIVE SARS-CoV-2 target nucleic acids are NOT DETECTED. The SARS-CoV-2 RNA is generally detectable in upper and lower  respiratory specimens during the acute phase of infection. The lowest  concentration of SARS-CoV-2 viral copies this assay can detect is 250  copies / mL. A negative result does not preclude SARS-CoV-2 infection  and should not be used as the sole basis for treatment or other  patient management decisions.  A negative result may occur with  improper specimen collection / handling, submission of  specimen other  than nasopharyngeal swab, presence of viral mutation(s) within the  areas targeted by this assay, and inadequate number of viral copies  (<250 copies / mL). A negative result must be combined with clinical  observations, patient history, and epidemiological information. If result is POSITIVE SARS-CoV-2 target nucleic acids are DETECTED. The SARS-CoV-2 RNA is generally detectable in upper and lower  respiratory specimens dur ing the acute phase of infection.  Positive  results are indicative of active infection with SARS-CoV-2.  Clinical  correlation with patient history and other diagnostic information is  necessary to determine patient infection status.  Positive results do  not rule out bacterial infection or co-infection with other viruses. If result is PRESUMPTIVE POSTIVE SARS-CoV-2 nucleic acids MAY BE PRESENT.   A presumptive positive result was obtained on the submitted specimen  and confirmed on repeat testing.  While 2019 novel coronavirus  (SARS-CoV-2) nucleic acids may be present in the submitted sample  additional confirmatory testing may be necessary for epidemiological  and / or clinical management purposes  to differentiate between  SARS-CoV-2 and other Sarbecovirus currently known to infect humans.  If clinically indicated additional testing with an alternate test  methodology [phone]([id number]) is advised. The SARS-CoV-2 RNA is generally   detectable in upper and lower respiratory sp ecimens during the acute  phase of infection. The expected result is Negative. Fact Sheet for Patients:  BoilerBrush.com.cyhttps://www.fda.gov/media/136312/download Fact Sheet for Healthcare Providers: https://pope.com/https://www.fda.gov/media/136313/download This test is not yet approved or cleared by the [place] FDA and has been authorized for detection and/or diagnosis of SARS-CoV-2 by FDA under an Emergency Use Authorization (EUA).  This EUA will remain in effect (meaning this test can be used) for the duration of the COVID-19 declaration under Section 564(b)(1) of the Act, 21 U.S.C. section 360bbb-3(b)(1), unless the authorization is terminated or revoked sooner. Performed at [facility], [address] [place], [place] [address]   Wound or Superficial Culture     Status: None   Collection Time: 08/01/19  2:10 AM   Specimen: Abscess; Wound  Result Value Ref Range Status   Specimen Description ABSCESS  Final   Special Requests NONE  Final   Gram Stain   Final    RARE WBC PRESENT, PREDOMINANTLY PMN MODERATE GRAM POSITIVE COCCI IN CLUSTERS Performed at [facility], [address] [place], [place] [address]    Culture ABUNDANT STAPHYLOCOCCUS AUREUS  Final   Report Status 08/03/2019 FINAL  Final   Organism ID, Bacteria STAPHYLOCOCCUS AUREUS  Final      Susceptibility   Staphylococcus aureus - MIC*    CIPROFLOXACIN <=0.5 SENSITIVE Sensitive     ERYTHROMYCIN <=0.25 SENSITIVE Sensitive     GENTAMICIN <=0.5 SENSITIVE Sensitive     OXACILLIN <=0.25 SENSITIVE Sensitive     TETRACYCLINE <=1 SENSITIVE Sensitive     VANCOMYCIN <=0.5 SENSITIVE Sensitive     TRIMETH/SULFA <=10 SENSITIVE Sensitive     CLINDAMYCIN <=0.25 SENSITIVE Sensitive     RIFAMPIN <=0.5 SENSITIVE Sensitive     Inducible Clindamycin NEGATIVE Sensitive     * ABUNDANT STAPHYLOCOCCUS AUREUS  Blood culture (routine x 2)     Status: Abnormal   Collection Time: 08/01/19  2:40 AM    Specimen: BLOOD LEFT ARM  Result Value Ref Range Status   Specimen Description BLOOD LEFT ARM  Final   Special Requests   Final    BOTTLES DRAWN AEROBIC AND ANAEROBIC Blood Culture adequate volume   Culture  Setup  Time   Final    GRAM POSITIVE COCCI IN BOTH AEROBIC AND ANAEROBIC BOTTLES CRITICAL VALUE NOTED.  VALUE IS CONSISTENT WITH PREVIOUSLY REPORTED AND CALLED VALUE.    Culture (A)  Final    STAPHYLOCOCCUS AUREUS SUSCEPTIBILITIES PERFORMED ON PREVIOUS CULTURE WITHIN THE LAST 5 DAYS. Performed at [facility], [address], [place], [place] [address]    Report Status 08/03/2019 FINAL  Final  MRSA PCR Screening     Status: None   Collection Time: 08/01/19  7:28 AM   Specimen: Nasal Mucosa; Nasopharyngeal  Result Value Ref Range Status   MRSA by PCR NEGATIVE NEGATIVE Final    Comment:        The GeneXpert MRSA Assay (FDA approved for NASAL specimens only), is one component of a comprehensive MRSA colonization surveillance program. It is not intended to diagnose MRSA infection nor to guide or monitor treatment for MRSA infections. Performed at [facility], [address], [place], [place] [address]   Aerobic/Anaerobic Culture (surgical/deep wound)     Status: None (Preliminary result)   Collection Time: 08/01/19  1:38 PM   Specimen: Abscess  Result Value Ref Range Status   Specimen Description ABSCESS LEFT CHEST WALL  Final   Special Requests PATIENT ON FOLLOWING MAXIPINE ANCEF  Final   Gram Stain   Final    ABUNDANT WBC PRESENT, PREDOMINANTLY PMN ABUNDANT GRAM POSITIVE COCCI Performed at [facility], [address], [place], [place] [address]    Culture   Final    ABUNDANT STAPHYLOCOCCUS AUREUS NO ANAEROBES ISOLATED; CULTURE IN PROGRESS FOR 5 DAYS    Report Status PENDING  Incomplete   Organism ID, Bacteria STAPHYLOCOCCUS AUREUS  Final      Susceptibility   Staphylococcus aureus - MIC*    CIPROFLOXACIN <=0.5 SENSITIVE Sensitive      ERYTHROMYCIN <=0.25 SENSITIVE Sensitive     GENTAMICIN <=0.5 SENSITIVE Sensitive     OXACILLIN <=0.25 SENSITIVE Sensitive     TETRACYCLINE <=1 SENSITIVE Sensitive     VANCOMYCIN 1 SENSITIVE Sensitive     TRIMETH/SULFA <=10 SENSITIVE Sensitive     CLINDAMYCIN <=0.25 SENSITIVE Sensitive     RIFAMPIN <=0.5 SENSITIVE Sensitive     Inducible Clindamycin NEGATIVE Sensitive     * ABUNDANT STAPHYLOCOCCUS AUREUS  Culture, blood (routine x 2)     Status: None (Preliminary result)   Collection Time: 08/02/19 10:26 AM   Specimen: BLOOD RIGHT ARM  Result Value Ref Range Status   Specimen Description BLOOD RIGHT ARM  Final   Special Requests   Final    BOTTLES DRAWN AEROBIC AND ANAEROBIC Blood Culture adequate volume   Culture   Final    NO GROWTH 1 DAY Performed at [facility], [address], [place], [place] [address]    Report Status PENDING  Incomplete  Culture, blood (routine x 2)     Status: None (Preliminary result)   Collection Time: 08/02/19 10:26 AM   Specimen: BLOOD LEFT ARM  Result Value Ref Range Status   Specimen Description BLOOD LEFT ARM  Final   Special Requests   Final    BOTTLES DRAWN AEROBIC ONLY Blood Culture adequate volume   Culture   Final    NO GROWTH 1 DAY Performed at [facility], [address] [place], [place] [address]    Report Status PENDING  Incomplete    Impression/Plan: The patient is a 38 y/o M IVDuer admitted with new  onset DM, fever and metastatic MSSA sepsis with septic PEs and LT chest wall abscess, s/p debridement, and multi-level lumbar osteomyelitis/epidural abscesses.  1. MSSA sepsis/chest wall abscess/septic pulmonary emboli - de-escalated ABX from vanc/cefepime to cefazolin 2 gm IV q 8 hrs and flagyl 500 mg PO q 8 hrs. TEE performed intra-op at the time of LT chest wall debridement and showed no active vegetation or significant valvular abnormalities. If BSI fails to clear, may need to consider repeating at a later time. Given  presence of septic PEs, fever, known IVDU, and several other sites of metastatic spread with a high risk pathogen, endocarditis is highly suspected.   2. Lumbar osteomyelitis - presence of multilevel spinal osteomyelitis with now confirmed MSSA along with lumbar epidural abscess makes this patient high risk to fail attempts at medical monotherapy with even prolonged optimized ABX alone. If he fails to clear his BSI, he should be considered for surgical intervention to lower his morbidity and mortality from his infection. At minimum, he would benefit from an aspiration of his lumbar epidural abscess for better control of this site. He will require 8 weeks of parenteral ABX treatment in total.  3. New onset DM -the patient's blood sugar was initially in the 700 range, requiring an insulin drip while in the ICU.  Will defer management to primary care service.  Would aim for tight glycemic control with blood sugars consistently at 150 or less to optimize his infectious outcome.

## 2019-08-03 NOTE — Progress Notes (Signed)
Inpatient Diabetes Program Recommendations  AACE/ADA: New Consensus Statement on Inpatient Glycemic Control (2015)  Target Ranges:  Prepandial:   less than 140 mg/dL      Peak postprandial:   less than 180 mg/dL (1-2 hours)      Critically ill patients:  140 - 180 mg/dL   Lab Results  Component Value Date   GLUCAP 236 (H) 08/03/2019   HGBA1C 7.8 (H) 08/01/2019    Review of Glycemic Control Results for ZAYDAN, PAPESH (MRN [id number]) as of 08/03/2019 17:16  Ref. Range 08/02/2019 19:21 08/02/2019 22:02 08/03/2019 07:01 08/03/2019 11:56  Glucose-Capillary Latest Ref Range: 70 - 99 mg/dL 238 (H) 210 (H) 203 (H) 236 (H)   Diabetes history: new onset DM Outpatient Diabetes medications: none Current orders for Inpatient glycemic control: Lantus 14 units QD, Novolog 0-15 units TID, Novolog 0-5 units QHS Decadron 4 mg x 1 9/2  Inpatient Diabetes Program Recommendations:    Consider increasing Lantus 18 units QD.   Spoke with patient and support person regarding new diagnosis of DM.  Reviewed patient's current A1c of 7.8% and admitting glucose of 701 mg/dL. Explained what a A1c is and what it measures. Also reviewed goal A1c with patient, importance of good glucose control @ home, and blood sugar goals. Reviewed role of pancreas, patho of DM, need for insulin, impact of glucose with infection & steroids, vascular changes and co-morbidies.  Patient will need a meter at discharge. Reviewed Relion products with patient. Attached information to discharge summary.  Encouraged to begin learning how to checking blood sugars and perform self injections.  Reviewed survival skills, when to call MD and needed interventions. Patient will need a follow up appointment. Will place CM consult.  Also, discussed the need to be mindful of CHO intake. Patient admits to drinking juice. Reviewed alternatives and place dietitian consult.  Ordered LWWDM.  Educated patient and support person on insulin pen use at home.  Reviewed contents of insulin flexpen starter kit. Reviewed all steps if insulin pen including attachment of needle, 2-unit air shot, dialing up dose, giving injection, removing needle, disposal of sharps, storage of unused insulin, disposal of insulin etc. Patient able to provide successful return demonstration. Also reviewed troubleshooting with insulin pen. MD to give patient Rxs for insulin pens and insulin pen needles.  Thanks, Bronson Curb, MSN, RNC-OB Diabetes Coordinator [phone] (8a-5p)

## 2019-08-04 LAB — BASIC METABOLIC PANEL
Anion gap: 12 (ref 5–15)
BUN: 28 mg/dL — ABNORMAL HIGH (ref 6–20)
CO2: 27 mmol/L (ref 22–32)
Calcium: 8 mg/dL — ABNORMAL LOW (ref 8.9–10.3)
Chloride: 95 mmol/L — ABNORMAL LOW (ref 98–111)
Creatinine, Ser: 1.24 mg/dL (ref 0.61–1.24)
GFR calc Af Amer: >60 mL/min (ref >60)
GFR calc non Af Amer: >60 mL/min (ref >60)
Glucose, Bld: 190 mg/dL — ABNORMAL HIGH (ref 70–99)
Potassium: 3 mmol/L — ABNORMAL LOW (ref 3.5–5.1)
Sodium: 134 mmol/L — ABNORMAL LOW (ref 135–145)

## 2019-08-04 LAB — CBC WITH DIFFERENTIAL/PLATELET
Abs Immature Granulocytes: 0.32 10*3/uL — ABNORMAL HIGH (ref 0.00–0.07)
Basophils Absolute: 0 10*3/uL (ref 0.0–0.1)
Basophils Relative: 0 %
Eosinophils Absolute: 0 10*3/uL (ref 0.0–0.5)
Eosinophils Relative: 0 %
HCT: 28.4 % — ABNORMAL LOW (ref 39.0–52.0)
Hemoglobin: 9.7 g/dL — ABNORMAL LOW (ref 13.0–17.0)
Immature Granulocytes: 3 %
Lymphocytes Relative: 10 %
Lymphs Abs: 1.2 10*3/uL (ref 0.7–4.0)
MCH: 30.2 pg (ref 26.0–34.0)
MCHC: 34.2 g/dL (ref 30.0–36.0)
MCV: 88.5 fL (ref 80.0–100.0)
Monocytes Absolute: 0.7 10*3/uL (ref 0.1–1.0)
Monocytes Relative: 5 %
Neutro Abs: 9.9 10*3/uL — ABNORMAL HIGH (ref 1.7–7.7)
Neutrophils Relative %: 82 %
Platelets: 450 10*3/uL — ABNORMAL HIGH (ref 150–400)
RBC: 3.21 MIL/uL — ABNORMAL LOW (ref 4.22–5.81)
RDW: 14.9 % (ref 11.5–15.5)
WBC: 12.1 10*3/uL — ABNORMAL HIGH (ref 4.0–10.5)
nRBC: 0 % (ref 0.0–0.2)

## 2019-08-04 LAB — GLUCOSE, CAPILLARY
Glucose-Capillary: 341 mg/dL — ABNORMAL HIGH (ref 70–99)
Glucose-Capillary: 285 mg/dL — ABNORMAL HIGH (ref 70–99)
Glucose-Capillary: 149 mg/dL — ABNORMAL HIGH (ref 70–99)

## 2019-08-04 LAB — PHOSPHORUS: Phosphorus: 3.9 mg/dL (ref 2.5–4.6)

## 2019-08-04 LAB — MAGNESIUM: Magnesium: 1.9 mg/dL (ref 1.7–2.4)

## 2019-08-04 MED ORDER — GLUCERNA SHAKE PO LIQD
237.0000 mL | Freq: Three times a day (TID) | ORAL | Status: DC
  Administered 2019-08-04 – 2019-08-21 (×41): 237 mL via ORAL
  Filled 2019-08-04 (×4): qty 237

## 2019-08-04 MED ORDER — INSULIN GLARGINE 100 UNIT/ML ~~LOC~~ SOLN
18.0000 [IU] | Freq: Every day | SUBCUTANEOUS | Status: DC
  Administered 2019-08-04 – 2019-08-08 (×5): 18 [IU] via SUBCUTANEOUS
  Filled 2019-08-04 (×6): qty 0.18

## 2019-08-04 MED ORDER — METOPROLOL TARTRATE 12.5 MG HALF TABLET
12.5000 mg | ORAL_TABLET | Freq: Two times a day (BID) | ORAL | Status: DC
  Administered 2019-08-04 – 2019-10-03 (×121): 12.5 mg via ORAL
  Filled 2019-08-04 (×122): qty 1

## 2019-08-04 MED ORDER — POTASSIUM CHLORIDE CRYS ER 20 MEQ PO TBCR
40.0000 meq | EXTENDED_RELEASE_TABLET | Freq: Once | ORAL | Status: AC
  Administered 2019-08-04: 40 meq via ORAL
  Filled 2019-08-04: qty 2

## 2019-08-04 NOTE — Progress Notes (Signed)
Pt is sitting up eating breakfast. Checked on pt. PT not in distress. MD Ghimire page notified on HR

## 2019-08-04 NOTE — Progress Notes (Signed)
PROGRESS NOTE    William [name]  EXB:[id number] DOB: 20-Feb-1981 DOA: 07/31/2019 PCP: Patient, No Pcp Per    Brief Narrative:  38 year old no previous medical history, heroin injection for last 10 years presented to emergency room on 9/1 with multiple complaints including lower back pain, left shoulder pain, right foot and heel pain.  In the emergency room, he has right heel abscess, left upper chest wall abscess and mass, CT scan noted left anterior chest wall soft tissue and intramuscular infection with extension into the thoracic cavity, air in the soft tissues of the right axilla adjacent to the right shoulder, multiple small pulmonary nodules.  Patient was admitted to ICU.  Underwent surgical debridement of the chest wall abscess and wound VAC placement.  Also found to have new diagnosis of diabetes.  MRI showed multiple epidural and paraspinal abscesses. Blood cultures 9/2: MSSA Blood cultures 9/3: Pending, no growth so far. Wound culture 9/2: GPC Right heel culture 9/3: Abundant WBC.  No growth so far.  Assessment & Plan:   Principal Problem:   Sepsis (HCC) Active Problems:   Abscess of paraspinous muscles   Osteomyelitis of lumbar spine (HCC)   Epidural abscess of spine due to infective embolism   Abnormal chest CT   Septic embolism (HCC)   Community acquired pneumonia   IV drug abuse (HCC)   Heroin abuse (HCC)   Elevated troponin   UTI (urinary tract infection)   Renal failure   Hyperglycemia   Suspected endocarditis   Staphylococcus aureus bacteremia   Chest wall abscess  Sepsis present on admission with MSSA bacteremia due to IV drug use: Septic pulmonary emboli, Left chest wall abscess, Right heel abscess, Extensive right posterolateral epidural abscess L2-L3, Osteomyelitis of the L5-S1, Posterior epidural abscess L5-S1 S2. Status post incision and drainage and debridement left chest wall on wound VAC, followed by CT CS - Bedside I&D right heel, cultures pending, local  dressing - Multiple epidural abscess and discitis, seen by neurosurgery recommended no surgical drainage but antibiotics - followed by infectious disease currently remains on cefazolin and metronidazole.  Repeat cultures pending. Intraoperative TEE with no evidence of endocarditis. - Anticipate intravenous antibiotics for about 6 to 8 weeks.  IV drug use: Ongoing opiate use.  Patient is motivated to quit.  Will provide symptomatic treatment.  Opiates for pain relief for postoperative pain.  Will use Tylenol and muscle relaxants. Tachycardia.  Blood pressures are stable.  Will add beta-blocker for symptomatic relief.  Mostly sinus rhythm.  New onset diabetes: A1c 7.8.  Started on insulin.  Sugars remain persistently high.  Will increase dose of long-acting insulin today.  Keep on sliding scale insulin.   DVT prophylaxis: Heparin subcu Code Status: Full code Family Communication: None Disposition Plan: Inpatient   Consultants:   PCCM, transfer out  CT CS, following  Neurosurgery, signed off  Orthopedics, signed off  Procedures:   Chest wall abscess incision and drainage, 08/02/2019  Right heel incision and drainage, 08/03/2019  Antimicrobials:   Vancomycin, 08/01/2019>>> 08/02/2019  Cefepime, 08/01/2019>>> 08/02/2019  Cefazolin, 08/02/2019>>>>>  Flagyl, 08/02/2019>>>  Diflucan, on 08/03/2019 dose  Nystatin swish and swallow, 08/03/2019   Subjective: Patient seen and examined.  No overnight events.  Sinus tachycardia on monitor.  Denies any nausea vomiting.  No chest pain.  He feels some air like pressure on the wound VAC area.  Right heel is hurting less. Objective: Vitals:   08/04/19 0425 08/04/19 0500 08/04/19 0801 08/04/19 1040  BP: 117/84  121/81  Pulse: (!) 109  (!) 116 (!) 113  Resp: 16  20   Temp:   98.8 F (37.1 C)   TempSrc:   Oral   SpO2: 100%  100%   Weight:  84 kg    Height:        Intake/Output Summary (Last 24 hours) at 08/04/2019 1135 Last data filed at  08/04/2019 0840 Gross per 24 hour  Intake 200 ml  Output 1550 ml  Net -1350 ml   Filed Weights   08/01/19 0415 08/03/19 0500 08/04/19 0500  Weight: 84.8 kg 89.3 kg 84 kg    Examination:  General exam: Appears calm and comfortable, not in any distress. Respiratory system: Clear to auscultation. Respiratory effort normal. Cardiovascular system: S1 & S2 heard, RRR. No JVD, murmurs, rubs, gallops or clicks. No pedal edema. Left anterior chest wall wound with wound VAC, draining pus. Gastrointestinal system: Abdomen is nondistended, soft and nontender. No organomegaly or masses felt. Normal bowel sounds heard. Central nervous system: Alert and oriented. No focal neurological deficits. Extremities: Symmetric 5 x 5 power.  No sensory deficits. Skin: No rashes, lesions or ulcers Right medial heel with surgical dressing.  Minimal pus drainage.   Psychiatry: Judgement and insight appear normal. Mood & affect appropriate.     Data Reviewed: I have personally reviewed following labs and imaging studies  CBC: Recent Labs  Lab 07/31/19 2302 08/01/19 0909 08/01/19 0929 08/02/19 0309 08/04/19 0352  WBC 19.3*  --  13.7* 13.4* 12.1*  NEUTROABS 18.5*  --   --   --  9.9*  HGB 11.1* 9.5* 8.9* 9.1* 9.7*  HCT 32.3* 28.0* 25.8* 24.8* 28.4*  MCV 88.7  --  87.2 84.6 88.5  PLT 347  --  291 336 450*   Basic Metabolic Panel: Recent Labs  Lab 07/31/19 2302 08/01/19 0909 08/01/19 0929 08/04/19 0352  NA 120* 133* 134* 134*  K 6.0* 4.1 4.5 3.0*  CL 79*  --  97* 95*  CO2 27  --  26 27  GLUCOSE 701*  --  179* 190*  BUN 53*  --  39* 28*  CREATININE 1.62*  --  1.25* 1.24  CALCIUM 8.4*  --  8.0* 8.0*  MG  --   --  2.1 1.9  PHOS  --   --  3.5 3.9   GFR: Estimated Creatinine Clearance: 93.9 mL/min (by C-G formula based on SCr of 1.24 mg/dL). Liver Function Tests: Recent Labs  Lab 07/31/19 2302 08/01/19 0929  AST 19 20  ALT 25 18  ALKPHOS 276* 178*  BILITOT 2.7* 2.6*  PROT 6.9 5.3*   ALBUMIN 1.7* 1.4*   No results for input(s): LIPASE, AMYLASE in the last 168 hours. No results for input(s): AMMONIA in the last 168 hours. Coagulation Profile: Recent Labs  Lab 07/31/19 2302 08/01/19 0929  INR 1.3* 1.3*   Cardiac Enzymes: Recent Labs  Lab 07/31/19 2302  CKTOTAL 71   BNP (last 3 results) No results for input(s): PROBNP in the last 8760 hours. HbA1C: No results for input(s): HGBA1C in the last 72 hours. CBG: Recent Labs  Lab 08/03/19 0701 08/03/19 1156 08/03/19 1718 08/03/19 2116 08/04/19 0616  GLUCAP 203* 236* 142* 124* 341*   Lipid Profile: No results for input(s): CHOL, HDL, LDLCALC, TRIG, CHOLHDL, LDLDIRECT in the last 72 hours. Thyroid Function Tests: No results for input(s): TSH, T4TOTAL, FREET4, T3FREE, THYROIDAB in the last 72 hours. Anemia Panel: No results for input(s): VITAMINB12, FOLATE, FERRITIN, TIBC, IRON, RETICCTPCT in  the last 72 hours. Sepsis Labs: Recent Labs  Lab 07/31/19 2302 08/01/19 0058  LATICACIDVEN 3.7* 3.5*    Recent Results (from the past 240 hour(s))  Urine culture     Status: Abnormal   Collection Time: 07/31/19 10:58 PM   Specimen: In/Out Cath Urine  Result Value Ref Range Status   Specimen Description IN/OUT CATH URINE  Final   Special Requests   Final    NONE Performed at [facility], [address] [place], [place] [address]    Culture >=100,000 COLONIES/mL STAPHYLOCOCCUS AUREUS (A)  Final   Report Status 08/02/2019 FINAL  Final   Organism ID, Bacteria STAPHYLOCOCCUS AUREUS (A)  Final      Susceptibility   Staphylococcus aureus - MIC*    CIPROFLOXACIN 1 SENSITIVE Sensitive     GENTAMICIN <=0.5 SENSITIVE Sensitive     NITROFURANTOIN <=16 SENSITIVE Sensitive     OXACILLIN 0.5 SENSITIVE Sensitive     TETRACYCLINE <=1 SENSITIVE Sensitive     VANCOMYCIN <=0.5 SENSITIVE Sensitive     TRIMETH/SULFA <=10 SENSITIVE Sensitive     CLINDAMYCIN <=0.25 SENSITIVE Sensitive     RIFAMPIN <=0.5 SENSITIVE  Sensitive     Inducible Clindamycin NEGATIVE Sensitive     * >=100,000 COLONIES/mL STAPHYLOCOCCUS AUREUS  Blood culture (routine x 2)     Status: Abnormal   Collection Time: 08/01/19  1:36 AM   Specimen: BLOOD  Result Value Ref Range Status   Specimen Description BLOOD RIGHT ANTECUBITAL  Final   Special Requests   Final    BOTTLES DRAWN AEROBIC AND ANAEROBIC Blood Culture adequate volume   Culture  Setup Time   Final    GRAM POSITIVE COCCI IN BOTH AEROBIC AND ANAEROBIC BOTTLES CRITICAL RESULT CALLED TO, READ BACK BY AND VERIFIED WITH: Ihor AustinJ. Millen PharmD 17:20 08/01/19 ([email or web address]) Performed at [facility], [address] [place], [place] [address]    Culture STAPHYLOCOCCUS AUREUS (A)  Final   Report Status 08/03/2019 FINAL  Final   Organism ID, Bacteria STAPHYLOCOCCUS AUREUS  Final      Susceptibility   Staphylococcus aureus - MIC*    CIPROFLOXACIN <=0.5 SENSITIVE Sensitive     ERYTHROMYCIN <=0.25 SENSITIVE Sensitive     GENTAMICIN <=0.5 SENSITIVE Sensitive     OXACILLIN <=0.25 SENSITIVE Sensitive     TETRACYCLINE <=1 SENSITIVE Sensitive     VANCOMYCIN 1 SENSITIVE Sensitive     TRIMETH/SULFA <=10 SENSITIVE Sensitive     CLINDAMYCIN <=0.25 SENSITIVE Sensitive     RIFAMPIN <=0.5 SENSITIVE Sensitive     Inducible Clindamycin NEGATIVE Sensitive     * STAPHYLOCOCCUS AUREUS  Blood Culture ID Panel (Reflexed)     Status: Abnormal   Collection Time: 08/01/19  1:36 AM  Result Value Ref Range Status   Enterococcus species NOT DETECTED NOT DETECTED Final   Listeria monocytogenes NOT DETECTED NOT DETECTED Final   Staphylococcus species DETECTED (A) NOT DETECTED Final    Comment: CRITICAL RESULT CALLED TO, READ BACK BY AND VERIFIED WITH: Ihor AustinJ. Millen PharmD 17:20 08/01/19 ([email or web address])    Staphylococcus aureus (BCID) DETECTED (A) NOT DETECTED Final    Comment: Methicillin (oxacillin) susceptible Staphylococcus aureus (MSSA). Preferred therapy is anti staphylococcal beta lactam antibiotic  (Cefazolin or Nafcillin), unless clinically contraindicated. CRITICAL RESULT CALLED TO, READ BACK BY AND VERIFIED WITH: Ihor AustinJ. Millen PharmD 17:20 08/01/19 ([email or web address])    Methicillin resistance NOT DETECTED NOT DETECTED Final   Streptococcus species NOT DETECTED NOT DETECTED Final   Streptococcus agalactiae NOT  DETECTED NOT DETECTED Final   Streptococcus pneumoniae NOT DETECTED NOT DETECTED Final   Streptococcus pyogenes NOT DETECTED NOT DETECTED Final   Acinetobacter baumannii NOT DETECTED NOT DETECTED Final   Enterobacteriaceae species NOT DETECTED NOT DETECTED Final   Enterobacter cloacae complex NOT DETECTED NOT DETECTED Final   Escherichia coli NOT DETECTED NOT DETECTED Final   Klebsiella oxytoca NOT DETECTED NOT DETECTED Final   Klebsiella pneumoniae NOT DETECTED NOT DETECTED Final   Proteus species NOT DETECTED NOT DETECTED Final   Serratia marcescens NOT DETECTED NOT DETECTED Final   Haemophilus influenzae NOT DETECTED NOT DETECTED Final   Neisseria meningitidis NOT DETECTED NOT DETECTED Final   Pseudomonas aeruginosa NOT DETECTED NOT DETECTED Final   Candida albicans NOT DETECTED NOT DETECTED Final   Candida glabrata NOT DETECTED NOT DETECTED Final   Candida krusei NOT DETECTED NOT DETECTED Final   Candida parapsilosis NOT DETECTED NOT DETECTED Final   Candida tropicalis NOT DETECTED NOT DETECTED Final    Comment: Performed at [facility], [place] [address] [place], [place] [address]  SARS Coronavirus 2 [facility] order, Performed in [facility] hospital lab) Nasopharyngeal Abscess     Status: None   Collection Time: 08/01/19  2:10 AM   Specimen: Abscess; Nasopharyngeal  Result Value Ref Range Status   SARS Coronavirus 2 NEGATIVE NEGATIVE Final    Comment: (NOTE) If result is NEGATIVE SARS-CoV-2 target nucleic acids are NOT DETECTED. The SARS-CoV-2 RNA is generally detectable in upper and lower  respiratory specimens during the acute phase of infection. The lowest   concentration of SARS-CoV-2 viral copies this assay can detect is 250  copies / mL. A negative result does not preclude SARS-CoV-2 infection  and should not be used as the sole basis for treatment or other  patient management decisions.  A negative result may occur with  improper specimen collection / handling, submission of specimen other  than nasopharyngeal swab, presence of viral mutation(s) within the  areas targeted by this assay, and inadequate number of viral copies  (<250 copies / mL). A negative result must be combined with clinical  observations, patient history, and epidemiological information. If result is POSITIVE SARS-CoV-2 target nucleic acids are DETECTED. The SARS-CoV-2 RNA is generally detectable in upper and lower  respiratory specimens dur ing the acute phase of infection.  Positive  results are indicative of active infection with SARS-CoV-2.  Clinical  correlation with patient history and other diagnostic information is  necessary to determine patient infection status.  Positive results do  not rule out bacterial infection or co-infection with other viruses. If result is PRESUMPTIVE POSTIVE SARS-CoV-2 nucleic acids MAY BE PRESENT.   A presumptive positive result was obtained on the submitted specimen  and confirmed on repeat testing.  While 2019 novel coronavirus  (SARS-CoV-2) nucleic acids may be present in the submitted sample  additional confirmatory testing may be necessary for epidemiological  and / or clinical management purposes  to differentiate between  SARS-CoV-2 and other Sarbecovirus currently known to infect humans.  If clinically indicated additional testing with an alternate test  methodology [phone]) is advised. The SARS-CoV-2 RNA is generally  detectable in upper and lower respiratory sp ecimens during the acute  phase of infection. The expected result is Negative. Fact Sheet for Patients:  StrictlyIdeas.no Fact Sheet  for Healthcare Providers: BankingDealers.co.za This test is not yet approved or cleared by the [place] FDA and has been authorized for detection and/or diagnosis of SARS-CoV-2 by FDA under  an Emergency Use Authorization (EUA).  This EUA will remain in effect (meaning this test can be used) for the duration of the COVID-19 declaration under Section 564(b)(1) of the Act, 21 U.S.C. section 360bbb-3(b)(1), unless the authorization is terminated or revoked sooner. Performed at [facility], [address], [place], [place] [address]   Wound or Superficial Culture     Status: None   Collection Time: 08/01/19  2:10 AM   Specimen: Abscess; Wound  Result Value Ref Range Status   Specimen Description ABSCESS  Final   Special Requests NONE  Final   Gram Stain   Final    RARE WBC PRESENT, PREDOMINANTLY PMN MODERATE GRAM POSITIVE COCCI IN CLUSTERS Performed at [facility], [address], [place], [place] [address]    Culture ABUNDANT STAPHYLOCOCCUS AUREUS  Final   Report Status 08/03/2019 FINAL  Final   Organism ID, Bacteria STAPHYLOCOCCUS AUREUS  Final      Susceptibility   Staphylococcus aureus - MIC*    CIPROFLOXACIN <=0.5 SENSITIVE Sensitive     ERYTHROMYCIN <=0.25 SENSITIVE Sensitive     GENTAMICIN <=0.5 SENSITIVE Sensitive     OXACILLIN <=0.25 SENSITIVE Sensitive     TETRACYCLINE <=1 SENSITIVE Sensitive     VANCOMYCIN <=0.5 SENSITIVE Sensitive     TRIMETH/SULFA <=10 SENSITIVE Sensitive     CLINDAMYCIN <=0.25 SENSITIVE Sensitive     RIFAMPIN <=0.5 SENSITIVE Sensitive     Inducible Clindamycin NEGATIVE Sensitive     * ABUNDANT STAPHYLOCOCCUS AUREUS  Blood culture (routine x 2)     Status: Abnormal   Collection Time: 08/01/19  2:40 AM   Specimen: BLOOD LEFT ARM  Result Value Ref Range Status   Specimen Description BLOOD LEFT ARM  Final   Special Requests   Final    BOTTLES DRAWN AEROBIC AND ANAEROBIC Blood Culture adequate volume   Culture   Setup Time   Final    GRAM POSITIVE COCCI IN BOTH AEROBIC AND ANAEROBIC BOTTLES CRITICAL VALUE NOTED.  VALUE IS CONSISTENT WITH PREVIOUSLY REPORTED AND CALLED VALUE.    Culture (A)  Final    STAPHYLOCOCCUS AUREUS SUSCEPTIBILITIES PERFORMED ON PREVIOUS CULTURE WITHIN THE LAST 5 DAYS. Performed at [facility], [address], [place], [place] [address]    Report Status 08/03/2019 FINAL  Final  MRSA PCR Screening     Status: None   Collection Time: 08/01/19  7:28 AM   Specimen: Nasal Mucosa; Nasopharyngeal  Result Value Ref Range Status   MRSA by PCR NEGATIVE NEGATIVE Final    Comment:        The GeneXpert MRSA Assay (FDA approved for NASAL specimens only), is one component of a comprehensive MRSA colonization surveillance program. It is not intended to diagnose MRSA infection nor to guide or monitor treatment for MRSA infections. Performed at [facility], [address] [place], [place] [address]   Aerobic/Anaerobic Culture (surgical/deep wound)     Status: None (Preliminary result)   Collection Time: 08/01/19  1:38 PM   Specimen: Abscess  Result Value Ref Range Status   Specimen Description ABSCESS LEFT CHEST WALL  Final   Special Requests PATIENT ON FOLLOWING MAXIPINE ANCEF  Final   Gram Stain   Final    ABUNDANT WBC PRESENT, PREDOMINANTLY PMN ABUNDANT GRAM POSITIVE COCCI Performed at [facility], [address], [place], [place] [address]    Culture   Final    ABUNDANT STAPHYLOCOCCUS AUREUS NO ANAEROBES ISOLATED; CULTURE  IN PROGRESS FOR 5 DAYS    Report Status PENDING  Incomplete   Organism ID, Bacteria STAPHYLOCOCCUS AUREUS  Final      Susceptibility   Staphylococcus aureus - MIC*    CIPROFLOXACIN <=0.5 SENSITIVE Sensitive     ERYTHROMYCIN <=0.25 SENSITIVE Sensitive     GENTAMICIN <=0.5 SENSITIVE Sensitive     OXACILLIN <=0.25 SENSITIVE Sensitive     TETRACYCLINE <=1 SENSITIVE Sensitive     VANCOMYCIN 1 SENSITIVE Sensitive     TRIMETH/SULFA  <=10 SENSITIVE Sensitive     CLINDAMYCIN <=0.25 SENSITIVE Sensitive     RIFAMPIN <=0.5 SENSITIVE Sensitive     Inducible Clindamycin NEGATIVE Sensitive     * ABUNDANT STAPHYLOCOCCUS AUREUS  Culture, blood (routine x 2)     Status: None (Preliminary result)   Collection Time: 08/02/19 10:26 AM   Specimen: BLOOD RIGHT ARM  Result Value Ref Range Status   Specimen Description BLOOD RIGHT ARM  Final   Special Requests   Final    BOTTLES DRAWN AEROBIC AND ANAEROBIC Blood Culture adequate volume   Culture   Final    NO GROWTH 1 DAY Performed at [facility], [address] [place], [place] [address]    Report Status PENDING  Incomplete  Culture, blood (routine x 2)     Status: None (Preliminary result)   Collection Time: 08/02/19 10:26 AM   Specimen: BLOOD LEFT ARM  Result Value Ref Range Status   Specimen Description BLOOD LEFT ARM  Final   Special Requests   Final    BOTTLES DRAWN AEROBIC ONLY Blood Culture adequate volume   Culture   Final    NO GROWTH 1 DAY Performed at [facility], [address] [place], [place] [address]    Report Status PENDING  Incomplete  Aerobic Culture (superficial specimen)     Status: None (Preliminary result)   Collection Time: 08/03/19 11:24 AM   Specimen: Heel; Wound  Result Value Ref Range Status   Specimen Description HEEL  Final   Special Requests Normal  Final   Gram Stain   Final    ABUNDANT WBC PRESENT, PREDOMINANTLY PMN ABUNDANT GRAM POSITIVE COCCI Performed at [facility], [address] [place], [place] [address]    Culture PENDING  Incomplete   Report Status PENDING  Incomplete         Radiology Studies: No results found.      Scheduled Meds: . Chlorhexidine Gluconate Cloth  6 each Topical Daily  . cyclobenzaprine  5 mg Oral TID  . heparin  5,000 Units Subcutaneous Q8H  . insulin aspart  0-15 Units Subcutaneous TID WC  . insulin aspart  0-5 Units Subcutaneous QHS  . insulin glargine  18 Units  Subcutaneous Daily  . metoprolol tartrate  12.5 mg Oral BID  . metroNIDAZOLE  500 mg Oral Q8H  . nystatin  5 mL Mouth/Throat QID  . ondansetron  4 mg Intravenous Once  . ramelteon  8 mg Oral QHS  . senna-docusate  1 tablet Oral BID   Continuous Infusions: .  ceFAZolin (ANCEF) IV Stopped (08/04/19 0553)     LOS: 3 days    Time spent: 30 minutes    Dorcas CarrowKuber Latrenda Irani, MD [facility] Pager [phone]  If 7PM-7AM, please contact night-coverage [email or web address] Password [email or web address] 08/04/2019, 11:35 AM

## 2019-08-04 NOTE — Progress Notes (Addendum)
      [address]       [place] [address]             [phone]       3 Days Post-Op Procedure(s) (LRB): I&D Left Chest wall abscess with application of wound vac (Left) TRANSESOPHAGEAL ECHOCARDIOGRAM (TEE) (N/A)   Subjective:  Patient doing better.  He does note that his legs seem sore, especially near his knees.  He states his ankle feels much better after getting drained yesterday.  Objective: Vital signs in last 24 hours: Temp:  [98.3 F (36.8 C)-99.5 F (37.5 C)] 99.5 F (37.5 C) (09/05 1141) Pulse Rate:  [81-116] 103 (09/05 1141) Cardiac Rhythm: Sinus tachycardia (09/05 0827) Resp:  [16-20] 20 (09/05 1141) BP: (117-157)/(75-84) 120/75 (09/05 1141) SpO2:  [99 %-100 %] 99 % (09/05 1141) Weight:  [84 kg] 84 kg (09/05 0500)  Intake/Output from previous day: 09/04 0701 - 09/05 0700 In: 200 [P.O.:100; IV Piggyback:100] Out: 1245 [Urine:1550] Intake/Output this shift: Total I/O In: 580 [P.O.:480; IV Piggyback:100] Out: 400 [Urine:400]  General appearance: alert, cooperative and no distress Heart: regular rate and rhythm Lungs: clear to auscultation bilaterally Wound: wound vac in place on left side... right side has minimal erythema, minimal swelling, non-tender  Lab Results: Recent Labs    08/02/19 0309 08/04/19 0352  WBC 13.4* 12.1*  HGB 9.1* 9.7*  HCT 24.8* 28.4*  PLT 336 450*   BMET:  Recent Labs    08/04/19 0352  NA 134*  K 3.0*  CL 95*  CO2 27  GLUCOSE 190*  BUN 28*  CREATININE 1.24  CALCIUM 8.0*    PT/INR: No results for input(s): LABPROT, INR in the last 72 hours. ABG    Component Value Date/Time   PHART 7.482 (H) 08/01/2019 0909   HCO3 31.0 (H) 08/01/2019 0909   TCO2 32 08/01/2019 0909   O2SAT 97.0 08/01/2019 0909   CBG (last 3)  Recent Labs    08/03/19 2116 08/04/19 0616 08/04/19 1203  GLUCAP 124* 341* 285*    Assessment/Plan: S/P Procedure(s) (LRB): I&D Left Chest wall abscess with application of  wound vac (Left) TRANSESOPHAGEAL ECHOCARDIOGRAM (TEE) (N/A)  1. I/D of Left sternoclavicular joint- wound vac in place, plan for change on Monday... wound growing staph 2. Right Ankle Abscess- drained yesterday by orthopedics, cultures sent 3. Dispo- continue current care per primary, wound vac change on Monday   LOS: 3 days    Ellwood Handler 08/04/2019 patient examined and medical record reviewed,agree with above note. Tharon Aquas Trigt III 08/04/2019

## 2019-08-04 NOTE — Plan of Care (Signed)
  Problem: Nutrition: Goal: Adequate nutrition will be maintained Outcome: Progressing   

## 2019-08-04 NOTE — Progress Notes (Signed)
Pt HR noted to be in 120-140's whiles sitting up on the side of bed. Pt denies any distress; asymptomatic whiles sitting. Pt potassium noted to be 3.0. NP on-call paged and notified; new order received. Will continue to closely monitor. Delia Heady RN

## 2019-08-04 NOTE — Progress Notes (Signed)
Initial Nutrition Assessment  RD working remotely.  DOCUMENTATION CODES:   Not applicable  INTERVENTION:  Provide Glucerna Shake po TID, each supplement provides 220 kcal and 10 grams of protein.  Encourage adequate PO intake.   Diet education handout regarding diabetes given.   NUTRITION DIAGNOSIS:   Increased nutrient needs related to wound healing as evidenced by estimated needs.  GOAL:   Patient will meet greater than or equal to 90% of their needs  MONITOR:   PO intake, Supplement acceptance, Skin, Weight trends, Labs, I & O's  REASON FOR ASSESSMENT:   Consult Diet education  ASSESSMENT:   38 year old no previous medical history, heroin injection for last 10 years presented to emergency room on 9/1 with multiple complaints including lower back pain, left shoulder pain, right foot and heel pain. Pt with sepsis present on admission with MSSA bacteremia due to IV drug use. Septic pulmonary emboli, Left chest wall abscess, Right heel abscess, Extensive right posterolateral epidural abscess L2-L3, Osteomyelitis of the L5-S1, Posterior epidural abscess L5-S1 S2. Status post incision and drainage and debridement left chest wall on wound VAC 9/3. S/p R heel I&D 9/4.   Pt unavailable during attempted time of contact. RD unable to obtain pt nutrition history at this time. RD to order nutritional supplements to aid in increased caloric and protein needs as well as in wound healing. RD consulted for diet education regarding new diagnosis diabetes. Last HbA1c 7.8%. Handout "Counting Carbohydrates for People with Diabetes" from the Academy of Nutrition and Dietetics Manual placed in pt discharge instructions. RD to additionally follow up with diet education.   Unable to complete Nutrition-Focused physical exam at this time.   Labs and medications reviewed.   Diet Order:   Diet Order            Diet Carb Modified Fluid consistency: Thin; Room service appropriate? Yes with Assist   Diet effective now              EDUCATION NEEDS:   Education needs have been addressed  Skin:  Skin Assessment: Skin Integrity Issues: Skin Integrity Issues:: Incisions, Wound VAC Wound Vac: L chest Incisions: L chest, R ankle  Last BM:  9/2  Height:   Ht Readings from Last 1 Encounters:  08/01/19 6\' 2"  (1.88 m)    Weight:   Wt Readings from Last 1 Encounters:  08/04/19 84 kg    Ideal Body Weight:  86.36 kg  BMI:  Body mass index is 23.78 kg/m.  Estimated Nutritional Needs:   Kcal:  2150-2350  Protein:  105-120 grams  Fluid:  >/= 2.1 L/day    Corrin Parker, MS, RD, LDN Pager # [phone] After hours/ weekend pager # [phone]

## 2019-08-05 LAB — GLUCOSE, CAPILLARY
Glucose-Capillary: 167 mg/dL — ABNORMAL HIGH (ref 70–99)
Glucose-Capillary: 136 mg/dL — ABNORMAL HIGH (ref 70–99)
Glucose-Capillary: 113 mg/dL — ABNORMAL HIGH (ref 70–99)
Glucose-Capillary: 135 mg/dL — ABNORMAL HIGH (ref 70–99)

## 2019-08-05 LAB — AEROBIC CULTURE W GRAM STAIN (SUPERFICIAL SPECIMEN): Special Requests: NORMAL

## 2019-08-05 MED ORDER — POLYETHYLENE GLYCOL 3350 17 G PO PACK
17.0000 g | PACK | Freq: Every day | ORAL | Status: DC
  Administered 2019-08-05 – 2019-08-08 (×2): 17 g via ORAL
  Filled 2019-08-05 (×4): qty 1

## 2019-08-05 MED ORDER — ALPRAZOLAM 0.25 MG PO TABS
0.2500 mg | ORAL_TABLET | Freq: Three times a day (TID) | ORAL | Status: DC | PRN
  Administered 2019-08-05 – 2019-08-20 (×15): 0.25 mg via ORAL
  Filled 2019-08-05 (×17): qty 1

## 2019-08-05 MED ORDER — ALPRAZOLAM 0.25 MG PO TABS
0.2500 mg | ORAL_TABLET | Freq: Once | ORAL | Status: AC
  Administered 2019-08-05: 0.25 mg via ORAL
  Filled 2019-08-05: qty 1

## 2019-08-05 MED ORDER — DOCUSATE SODIUM 100 MG PO CAPS
100.0000 mg | ORAL_CAPSULE | Freq: Every day | ORAL | Status: DC
  Administered 2019-08-05 – 2019-09-02 (×25): 100 mg via ORAL
  Filled 2019-08-05 (×28): qty 1

## 2019-08-05 NOTE — Progress Notes (Signed)
Patient requested to see nurse, stated he was having a panic attack and a lot of anxiety. Upon assessment patient was tearful and stated he was embarrassed that his girlfriend knew about his drug use. Nurse offered chaplain services in which patient accepted. He stated that he would feel better to have someone to talk to. Nurse paged chaplain. Will continue to monitor.

## 2019-08-05 NOTE — Progress Notes (Signed)
   08/05/19 2000  Clinical Encounter Type  Visited With Patient;Health care provider  Visit Type Initial;Psychological support;Social support  Referral From Nurse  Spiritual Encounters  Spiritual Needs Emotional;Grief support  Stress Factors  Patient Stress Factors Loss of control;Major life changes;Family relationships   Discussed pt's points of distress, explored how that is related to his substance abuse and his ambivalence about being authentic in light of shame.  [place], [phone]

## 2019-08-05 NOTE — Progress Notes (Signed)
PROGRESS NOTE    Sherlon Handingaylor L Mangan  ZOX:[id number] DOB: March 24, 1981 DOA: 07/31/2019 PCP: Patient, No Pcp Per    Brief Narrative:  38 year old no previous medical history, heroin injection for last 10 years presented to emergency room on 9/1 with multiple complaints including lower back pain, left shoulder pain, right foot and heel pain.  In the emergency room, he has right heel abscess, left upper chest wall abscess and mass, CT scan noted left anterior chest wall soft tissue and intramuscular infection with extension into the thoracic cavity, air in the soft tissues of the right axilla adjacent to the right shoulder, multiple small pulmonary nodules.  Patient was admitted to ICU.  Underwent surgical debridement of the chest wall abscess and wound VAC placement.  Also found to have new diagnosis of diabetes.  MRI showed multiple epidural and paraspinal abscesses. Blood cultures 9/2: MSSA Blood cultures 9/3: Pending, no growth so far. Wound culture 9/2: GPC Right heel culture 9/3: Abundant WBC.  MSSA.  Assessment & Plan:   Principal Problem:   Sepsis (HCC) Active Problems:   Abscess of paraspinous muscles   Osteomyelitis of lumbar spine (HCC)   Epidural abscess of spine due to infective embolism   Abnormal chest CT   Septic embolism (HCC)   Community acquired pneumonia   IV drug abuse (HCC)   Heroin abuse (HCC)   Elevated troponin   UTI (urinary tract infection)   Renal failure   Hyperglycemia   Suspected endocarditis   Staphylococcus aureus bacteremia   Chest wall abscess  Sepsis present on admission with MSSA bacteremia due to IV drug use: Septic pulmonary emboli, Left chest wall abscess, Right heel abscess, Extensive right posterolateral epidural abscess L2-L3, Osteomyelitis of the L5-S1, Posterior epidural abscess L5-S1 S2. - Status post incision and drainage and debridement left chest wall on wound VAC, followed by CT CS - Bedside I&D right heel, MSSA. - Multiple epidural  abscess and discitis, seen by neurosurgery recommended no surgical drainage but antibiotics - followed by infectious disease currently remains on cefazolin and metronidazole.  Repeat cultures were negative. -Spiking low-grade temperature.  Will repeat cultures today again. - Intraoperative TEE with no evidence of endocarditis. - Anticipate intravenous antibiotics for about 6 to 8 weeks. -Complains of bilateral knee pain, no evidence of septic arthritis or effusion.  If persistent will talk to orthopedics.  IV drug use: Ongoing opiate use.  Patient is motivated to quit.  Will provide symptomatic treatment.  Opiates for pain relief for postoperative pain.  Will use Tylenol and muscle relaxants. Use metoprolol for tachycardia. Patient to use Xanax for craving and anxiety.  New onset diabetes: A1c 7.8.  Started on insulin.  Stabilizing on increased dose of insulin. Keep on sliding scale insulin.  MiraLAX and Colace for constipation. Advised ambulation. Repeat cultures today.   DVT prophylaxis: Heparin subcu Code Status: Full code Family Communication: None Disposition Plan: Inpatient   Consultants:   PCCM, transfer out  CT CS, following  Neurosurgery, signed off  Orthopedics, signed off  Procedures:   Chest wall abscess incision and drainage, 08/02/2019  Right heel incision and drainage, 08/03/2019  Antimicrobials:   Vancomycin, 08/01/2019>>> 08/02/2019  Cefepime, 08/01/2019>>> 08/02/2019  Cefazolin, 08/02/2019>>>>>  Flagyl, 08/02/2019>>>  Diflucan, on 08/03/2019 dose  Nystatin swish and swallow, 08/03/2019-08/04/2019.  Patient declined.   Subjective: Patient seen and examined.  Overnight he did not feel well.  He had recurrent temperature 100.7.  Felt anxious.  Bilateral knee pain.  He had a hard time  defecating with very hard stool.  Objective: Vitals:   08/05/19 0118 08/05/19 0318 08/05/19 0500 08/05/19 0733  BP: 138/83 130/74  133/76  Pulse: 94 84  95  Resp: 20 16  18   Temp:  99.7 F (37.6 C) (!) 100.7 F (38.2 C)  100.1 F (37.8 C)  TempSrc: Oral Oral  Oral  SpO2: 98%   98%  Weight:   84.3 kg   Height:        Intake/Output Summary (Last 24 hours) at 08/05/2019 1126 Last data filed at 08/05/2019 0739 Gross per 24 hour  Intake 880 ml  Output 900 ml  Net -20 ml   Filed Weights   08/03/19 0500 08/04/19 0500 08/05/19 0500  Weight: 89.3 kg 84 kg 84.3 kg    Examination:  General exam: Appears anxious today.  Otherwise comfortable on room air. Respiratory system: Clear to auscultation. Respiratory effort normal. Cardiovascular system: S1 & S2 heard, RRR. No JVD, murmurs, rubs, gallops or clicks. No pedal edema. Left anterior chest wall wound with wound VAC, draining pus. Gastrointestinal system: Abdomen is nondistended, soft and nontender. No organomegaly or masses felt. Normal bowel sounds heard. Central nervous system: Alert and oriented. No focal neurological deficits. Extremities: Symmetric 5 x 5 power.  No sensory deficits. Skin: No rashes, lesions.  Right heel dressing intact. Minimal pus drainage.   Bilateral knee and other joints with no effusion, no redness or erythema. Psychiatry: Judgement and insight appear normal. Mood & affect anxious.    Data Reviewed: I have personally reviewed following labs and imaging studies  CBC: Recent Labs  Lab 07/31/19 2302 08/01/19 0909 08/01/19 0929 08/02/19 0309 08/04/19 0352  WBC 19.3*  --  13.7* 13.4* 12.1*  NEUTROABS 18.5*  --   --   --  9.9*  HGB 11.1* 9.5* 8.9* 9.1* 9.7*  HCT 32.3* 28.0* 25.8* 24.8* 28.4*  MCV 88.7  --  87.2 84.6 88.5  PLT 347  --  291 336 450*   Basic Metabolic Panel: Recent Labs  Lab 07/31/19 2302 08/01/19 0909 08/01/19 0929 08/04/19 0352  NA 120* 133* 134* 134*  K 6.0* 4.1 4.5 3.0*  CL 79*  --  97* 95*  CO2 27  --  26 27  GLUCOSE 701*  --  179* 190*  BUN 53*  --  39* 28*  CREATININE 1.62*  --  1.25* 1.24  CALCIUM 8.4*  --  8.0* 8.0*  MG  --   --  2.1 1.9  PHOS   --   --  3.5 3.9   GFR: Estimated Creatinine Clearance: 93.9 mL/min (by C-G formula based on SCr of 1.24 mg/dL). Liver Function Tests: Recent Labs  Lab 07/31/19 2302 08/01/19 0929  AST 19 20  ALT 25 18  ALKPHOS 276* 178*  BILITOT 2.7* 2.6*  PROT 6.9 5.3*  ALBUMIN 1.7* 1.4*   No results for input(s): LIPASE, AMYLASE in the last 168 hours. No results for input(s): AMMONIA in the last 168 hours. Coagulation Profile: Recent Labs  Lab 07/31/19 2302 08/01/19 0929  INR 1.3* 1.3*   Cardiac Enzymes: Recent Labs  Lab 07/31/19 2302  CKTOTAL 71   BNP (last 3 results) No results for input(s): PROBNP in the last 8760 hours. HbA1C: No results for input(s): HGBA1C in the last 72 hours. CBG: Recent Labs  Lab 08/03/19 2116 08/04/19 0616 08/04/19 1203 08/04/19 2111 08/05/19 0626  GLUCAP 124* 341* 285* 149* 167*   Lipid Profile: No results for input(s): CHOL, HDL, LDLCALC, TRIG,  CHOLHDL, LDLDIRECT in the last 72 hours. Thyroid Function Tests: No results for input(s): TSH, T4TOTAL, FREET4, T3FREE, THYROIDAB in the last 72 hours. Anemia Panel: No results for input(s): VITAMINB12, FOLATE, FERRITIN, TIBC, IRON, RETICCTPCT in the last 72 hours. Sepsis Labs: Recent Labs  Lab 07/31/19 2302 08/01/19 0058  LATICACIDVEN 3.7* 3.5*    Recent Results (from the past 240 hour(s))  Urine culture     Status: Abnormal   Collection Time: 07/31/19 10:58 PM   Specimen: In/Out Cath Urine  Result Value Ref Range Status   Specimen Description IN/OUT CATH URINE  Final   Special Requests   Final    NONE Performed at [facility], [address], [place], [place] [address]    Culture >=100,000 COLONIES/mL STAPHYLOCOCCUS AUREUS (A)  Final   Report Status 08/02/2019 FINAL  Final   Organism ID, Bacteria STAPHYLOCOCCUS AUREUS (A)  Final      Susceptibility   Staphylococcus aureus - MIC*    CIPROFLOXACIN 1 SENSITIVE Sensitive     GENTAMICIN <=0.5 SENSITIVE Sensitive     NITROFURANTOIN  <=16 SENSITIVE Sensitive     OXACILLIN 0.5 SENSITIVE Sensitive     TETRACYCLINE <=1 SENSITIVE Sensitive     VANCOMYCIN <=0.5 SENSITIVE Sensitive     TRIMETH/SULFA <=10 SENSITIVE Sensitive     CLINDAMYCIN <=0.25 SENSITIVE Sensitive     RIFAMPIN <=0.5 SENSITIVE Sensitive     Inducible Clindamycin NEGATIVE Sensitive     * >=100,000 COLONIES/mL STAPHYLOCOCCUS AUREUS  Blood culture (routine x 2)     Status: Abnormal   Collection Time: 08/01/19  1:36 AM   Specimen: BLOOD  Result Value Ref Range Status   Specimen Description BLOOD RIGHT ANTECUBITAL  Final   Special Requests   Final    BOTTLES DRAWN AEROBIC AND ANAEROBIC Blood Culture adequate volume   Culture  Setup Time   Final    GRAM POSITIVE COCCI IN BOTH AEROBIC AND ANAEROBIC BOTTLES CRITICAL RESULT CALLED TO, READ BACK BY AND VERIFIED WITH: Ihor Austin PharmD 17:20 08/01/19 ([email or web address]) Performed at [facility], [address], [place], [place] [address]    Culture STAPHYLOCOCCUS AUREUS (A)  Final   Report Status 08/03/2019 FINAL  Final   Organism ID, Bacteria STAPHYLOCOCCUS AUREUS  Final      Susceptibility   Staphylococcus aureus - MIC*    CIPROFLOXACIN <=0.5 SENSITIVE Sensitive     ERYTHROMYCIN <=0.25 SENSITIVE Sensitive     GENTAMICIN <=0.5 SENSITIVE Sensitive     OXACILLIN <=0.25 SENSITIVE Sensitive     TETRACYCLINE <=1 SENSITIVE Sensitive     VANCOMYCIN 1 SENSITIVE Sensitive     TRIMETH/SULFA <=10 SENSITIVE Sensitive     CLINDAMYCIN <=0.25 SENSITIVE Sensitive     RIFAMPIN <=0.5 SENSITIVE Sensitive     Inducible Clindamycin NEGATIVE Sensitive     * STAPHYLOCOCCUS AUREUS  Blood Culture ID Panel (Reflexed)     Status: Abnormal   Collection Time: 08/01/19  1:36 AM  Result Value Ref Range Status   Enterococcus species NOT DETECTED NOT DETECTED Final   Listeria monocytogenes NOT DETECTED NOT DETECTED Final   Staphylococcus species DETECTED (A) NOT DETECTED Final    Comment: CRITICAL RESULT CALLED TO, READ BACK BY AND  VERIFIED WITH: Ihor Austin PharmD 17:20 08/01/19 ([email or web address])    Staphylococcus aureus (BCID) DETECTED (A) NOT DETECTED Final    Comment: Methicillin (oxacillin) susceptible Staphylococcus aureus (MSSA). Preferred therapy is anti staphylococcal beta lactam antibiotic (Cefazolin or Nafcillin), unless clinically contraindicated. CRITICAL RESULT CALLED  TO, READ BACK BY AND VERIFIED WITH: Ihor Austin PharmD 17:20 08/01/19 ([email or web address])    Methicillin resistance NOT DETECTED NOT DETECTED Final   Streptococcus species NOT DETECTED NOT DETECTED Final   Streptococcus agalactiae NOT DETECTED NOT DETECTED Final   Streptococcus pneumoniae NOT DETECTED NOT DETECTED Final   Streptococcus pyogenes NOT DETECTED NOT DETECTED Final   Acinetobacter baumannii NOT DETECTED NOT DETECTED Final   Enterobacteriaceae species NOT DETECTED NOT DETECTED Final   Enterobacter cloacae complex NOT DETECTED NOT DETECTED Final   Escherichia coli NOT DETECTED NOT DETECTED Final   Klebsiella oxytoca NOT DETECTED NOT DETECTED Final   Klebsiella pneumoniae NOT DETECTED NOT DETECTED Final   Proteus species NOT DETECTED NOT DETECTED Final   Serratia marcescens NOT DETECTED NOT DETECTED Final   Haemophilus influenzae NOT DETECTED NOT DETECTED Final   Neisseria meningitidis NOT DETECTED NOT DETECTED Final   Pseudomonas aeruginosa NOT DETECTED NOT DETECTED Final   Candida albicans NOT DETECTED NOT DETECTED Final   Candida glabrata NOT DETECTED NOT DETECTED Final   Candida krusei NOT DETECTED NOT DETECTED Final   Candida parapsilosis NOT DETECTED NOT DETECTED Final   Candida tropicalis NOT DETECTED NOT DETECTED Final    Comment: Performed at [facility], [address], [place], [place] [address]  SARS Coronavirus 2 [facility] order, Performed in [facility] hospital lab) Nasopharyngeal Abscess     Status: None   Collection Time: 08/01/19  2:10 AM   Specimen: Abscess; Nasopharyngeal  Result Value Ref Range Status   SARS Coronavirus 2  NEGATIVE NEGATIVE Final    Comment: (NOTE) If result is NEGATIVE SARS-CoV-2 target nucleic acids are NOT DETECTED. The SARS-CoV-2 RNA is generally detectable in upper and lower  respiratory specimens during the acute phase of infection. The lowest  concentration of SARS-CoV-2 viral copies this assay can detect is 250  copies / mL. A negative result does not preclude SARS-CoV-2 infection  and should not be used as the sole basis for treatment or other  patient management decisions.  A negative result may occur with  improper specimen collection / handling, submission of specimen other  than nasopharyngeal swab, presence of viral mutation(s) within the  areas targeted by this assay, and inadequate number of viral copies  (<250 copies / mL). A negative result must be combined with clinical  observations, patient history, and epidemiological information. If result is POSITIVE SARS-CoV-2 target nucleic acids are DETECTED. The SARS-CoV-2 RNA is generally detectable in upper and lower  respiratory specimens dur ing the acute phase of infection.  Positive  results are indicative of active infection with SARS-CoV-2.  Clinical  correlation with patient history and other diagnostic information is  necessary to determine patient infection status.  Positive results do  not rule out bacterial infection or co-infection with other viruses. If result is PRESUMPTIVE POSTIVE SARS-CoV-2 nucleic acids MAY BE PRESENT.   A presumptive positive result was obtained on the submitted specimen  and confirmed on repeat testing.  While 2019 novel coronavirus  (SARS-CoV-2) nucleic acids may be present in the submitted sample  additional confirmatory testing may be necessary for epidemiological  and / or clinical management purposes  to differentiate between  SARS-CoV-2 and other Sarbecovirus currently known to infect humans.  If clinically indicated additional testing with an alternate test  methodology [phone])  is advised. The SARS-CoV-2 RNA is generally  detectable in upper and lower respiratory sp ecimens during the acute  phase of infection. The expected result is Negative. Fact  Sheet for Patients:  BoilerBrush.com.cy Fact Sheet for Healthcare Providers: https://pope.com/ This test is not yet approved or cleared by the [place] FDA and has been authorized for detection and/or diagnosis of SARS-CoV-2 by FDA under an Emergency Use Authorization (EUA).  This EUA will remain in effect (meaning this test can be used) for the duration of the COVID-19 declaration under Section 564(b)(1) of the Act, 21 U.S.C. section 360bbb-3(b)(1), unless the authorization is terminated or revoked sooner. Performed at [facility], [address] [place], [place] [address]   Wound or Superficial Culture     Status: None   Collection Time: 08/01/19  2:10 AM   Specimen: Abscess; Wound  Result Value Ref Range Status   Specimen Description ABSCESS  Final   Special Requests NONE  Final   Gram Stain   Final    RARE WBC PRESENT, PREDOMINANTLY PMN MODERATE GRAM POSITIVE COCCI IN CLUSTERS Performed at [facility], [address], [place], [place] [address]    Culture ABUNDANT STAPHYLOCOCCUS AUREUS  Final   Report Status 08/03/2019 FINAL  Final   Organism ID, Bacteria STAPHYLOCOCCUS AUREUS  Final      Susceptibility   Staphylococcus aureus - MIC*    CIPROFLOXACIN <=0.5 SENSITIVE Sensitive     ERYTHROMYCIN <=0.25 SENSITIVE Sensitive     GENTAMICIN <=0.5 SENSITIVE Sensitive     OXACILLIN <=0.25 SENSITIVE Sensitive     TETRACYCLINE <=1 SENSITIVE Sensitive     VANCOMYCIN <=0.5 SENSITIVE Sensitive     TRIMETH/SULFA <=10 SENSITIVE Sensitive     CLINDAMYCIN <=0.25 SENSITIVE Sensitive     RIFAMPIN <=0.5 SENSITIVE Sensitive     Inducible Clindamycin NEGATIVE Sensitive     * ABUNDANT STAPHYLOCOCCUS AUREUS  Blood culture (routine x 2)     Status: Abnormal    Collection Time: 08/01/19  2:40 AM   Specimen: BLOOD LEFT ARM  Result Value Ref Range Status   Specimen Description BLOOD LEFT ARM  Final   Special Requests   Final    BOTTLES DRAWN AEROBIC AND ANAEROBIC Blood Culture adequate volume   Culture  Setup Time   Final    GRAM POSITIVE COCCI IN BOTH AEROBIC AND ANAEROBIC BOTTLES CRITICAL VALUE NOTED.  VALUE IS CONSISTENT WITH PREVIOUSLY REPORTED AND CALLED VALUE.    Culture (A)  Final    STAPHYLOCOCCUS AUREUS SUSCEPTIBILITIES PERFORMED ON PREVIOUS CULTURE WITHIN THE LAST 5 DAYS. Performed at [facility], [address], [place], [place] [address]    Report Status 08/03/2019 FINAL  Final  MRSA PCR Screening     Status: None   Collection Time: 08/01/19  7:28 AM   Specimen: Nasal Mucosa; Nasopharyngeal  Result Value Ref Range Status   MRSA by PCR NEGATIVE NEGATIVE Final    Comment:        The GeneXpert MRSA Assay (FDA approved for NASAL specimens only), is one component of a comprehensive MRSA colonization surveillance program. It is not intended to diagnose MRSA infection nor to guide or monitor treatment for MRSA infections. Performed at [facility], [address], [place], [place] [address]   Aerobic/Anaerobic Culture (surgical/deep wound)     Status: None (Preliminary result)   Collection Time: 08/01/19  1:38 PM   Specimen: Abscess  Result Value Ref Range Status   Specimen Description ABSCESS LEFT CHEST WALL  Final   Special Requests PATIENT ON FOLLOWING MAXIPINE ANCEF  Final   Gram Stain   Final    ABUNDANT WBC  PRESENT, PREDOMINANTLY PMN ABUNDANT GRAM POSITIVE COCCI Performed at [facility], [place] [address] [place], [place] [address]    Culture   Final    ABUNDANT STAPHYLOCOCCUS AUREUS NO ANAEROBES ISOLATED; CULTURE IN PROGRESS FOR 5 DAYS    Report Status PENDING  Incomplete   Organism ID, Bacteria STAPHYLOCOCCUS AUREUS  Final      Susceptibility   Staphylococcus aureus - MIC*    CIPROFLOXACIN  <=0.5 SENSITIVE Sensitive     ERYTHROMYCIN <=0.25 SENSITIVE Sensitive     GENTAMICIN <=0.5 SENSITIVE Sensitive     OXACILLIN <=0.25 SENSITIVE Sensitive     TETRACYCLINE <=1 SENSITIVE Sensitive     VANCOMYCIN 1 SENSITIVE Sensitive     TRIMETH/SULFA <=10 SENSITIVE Sensitive     CLINDAMYCIN <=0.25 SENSITIVE Sensitive     RIFAMPIN <=0.5 SENSITIVE Sensitive     Inducible Clindamycin NEGATIVE Sensitive     * ABUNDANT STAPHYLOCOCCUS AUREUS  Culture, blood (routine x 2)     Status: None (Preliminary result)   Collection Time: 08/02/19 10:26 AM   Specimen: BLOOD RIGHT ARM  Result Value Ref Range Status   Specimen Description BLOOD RIGHT ARM  Final   Special Requests   Final    BOTTLES DRAWN AEROBIC AND ANAEROBIC Blood Culture adequate volume   Culture   Final    NO GROWTH 3 DAYS Performed at [facility], [address], [place], [place] [address]    Report Status PENDING  Incomplete  Culture, blood (routine x 2)     Status: None (Preliminary result)   Collection Time: 08/02/19 10:26 AM   Specimen: BLOOD LEFT ARM  Result Value Ref Range Status   Specimen Description BLOOD LEFT ARM  Final   Special Requests   Final    BOTTLES DRAWN AEROBIC ONLY Blood Culture adequate volume   Culture   Final    NO GROWTH 3 DAYS Performed at [facility], [address], [place], [place] [address]    Report Status PENDING  Incomplete  Aerobic Culture (superficial specimen)     Status: None (Preliminary result)   Collection Time: 08/03/19 11:24 AM   Specimen: Heel; Wound  Result Value Ref Range Status   Specimen Description HEEL  Final   Special Requests Normal  Final   Gram Stain   Final    ABUNDANT WBC PRESENT, PREDOMINANTLY PMN ABUNDANT GRAM POSITIVE COCCI    Culture   Final    ABUNDANT STAPHYLOCOCCUS AUREUS SUSCEPTIBILITIES TO FOLLOW Performed at [facility], [place] [address] [place], [place] [address]    Report Status PENDING  Incomplete         Radiology Studies:  No results found.      Scheduled Meds: . Chlorhexidine Gluconate Cloth  6 each Topical Daily  . docusate sodium  100 mg Oral Daily  . feeding supplement (GLUCERNA SHAKE)  237 mL Oral TID BM  . heparin  5,000 Units Subcutaneous Q8H  . insulin aspart  0-15 Units Subcutaneous TID WC  . insulin aspart  0-5 Units Subcutaneous QHS  . insulin glargine  18 Units Subcutaneous Daily  . metoprolol tartrate  12.5 mg Oral BID  . metroNIDAZOLE  500 mg Oral Q8H  . ondansetron  4 mg Intravenous Once  . polyethylene glycol  17 g Oral Daily  . ramelteon  8 mg Oral QHS  . senna-docusate  1 tablet Oral BID   Continuous Infusions: .  ceFAZolin (ANCEF) IV Stopped (08/05/19 0559)  LOS: 4 days    Time spent: 30 minutes    Dorcas Carrow, MD [facility] Pager [phone]  If 7PM-7AM, please contact night-coverage [email or web address] Password [email or web address] 08/05/2019, 11:26 AM

## 2019-08-05 NOTE — Progress Notes (Signed)
      [address]       ,[place] [id number]             [phone]      4 Days Post-Op Procedure(s) (LRB): I&D Left Chest wall abscess with application of wound vac (Left) TRANSESOPHAGEAL ECHOCARDIOGRAM (TEE) (N/A)   Subjective:  Patient having a lot of pain and anxiety currently.  Continues to voice complaint about bilateral knee pain and states this has gotten worse since yesterday.  Objective: Vital signs in last 24 hours: Temp:  [99.5 F (37.5 C)-100.7 F (38.2 C)] 100.1 F (37.8 C) (09/06 0733) Pulse Rate:  [84-113] 95 (09/06 0733) Cardiac Rhythm: Sinus tachycardia (09/06 0700) Resp:  [14-20] 18 (09/06 0733) BP: (120-138)/(74-83) 133/76 (09/06 0733) SpO2:  [98 %-100 %] 98 % (09/06 0733) Weight:  [84.3 kg] 84.3 kg (09/06 0500)  Intake/Output from previous day: 09/05 0701 - 09/06 0700 In: 1460 [P.O.:960; IV Piggyback:500] Out: 900 [Urine:850; Drains:50] Intake/Output this shift: Total I/O In: -  Out: 400 [Urine:400]  General appearance: alert, cooperative and no distress Heart: tachy Lungs: clear to auscultation bilaterally Wound: wound vac in place, minimal edema present, no surrounding erythema present  Lab Results: Recent Labs    08/04/19 0352  WBC 12.1*  HGB 9.7*  HCT 28.4*  PLT 450*   BMET:  Recent Labs    08/04/19 0352  NA 134*  K 3.0*  CL 95*  CO2 27  GLUCOSE 190*  BUN 28*  CREATININE 1.24  CALCIUM 8.0*    PT/INR: No results for input(s): LABPROT, INR in the last 72 hours. ABG    Component Value Date/Time   PHART 7.482 (H) 08/01/2019 0909   HCO3 31.0 (H) 08/01/2019 0909   TCO2 32 08/01/2019 0909   O2SAT 97.0 08/01/2019 0909   CBG (last 3)  Recent Labs    08/04/19 1203 08/04/19 2111 08/05/19 0626  GLUCAP 285* 149* 167*    Assessment/Plan: S/P Procedure(s) (LRB): I&D Left Chest wall abscess with application of wound vac (Left) TRANSESOPHAGEAL ECHOCARDIOGRAM (TEE) (N/A)  1. I/D of Left SCM- wound vac in  place, plan for wound care to change tomorrow, drainage looks to be purulent in cannister 2. Bilateral knee pain--- may benefit from additional ortho evaluation 3. Dispo- patient stable, wound vac change tomorrow by wound care, care per primary   LOS: 4 days    Ellwood Handler 08/05/2019

## 2019-08-05 NOTE — Progress Notes (Signed)
Pt c/o not feeling well; VSS: pt report having panic attack and anxiety. NP on-call paged and notified; new order received. Will continue to closely monitor pt. Delia Heady RN

## 2019-08-06 LAB — GLUCOSE, CAPILLARY
Glucose-Capillary: 259 mg/dL — ABNORMAL HIGH (ref 70–99)
Glucose-Capillary: 184 mg/dL — ABNORMAL HIGH (ref 70–99)
Glucose-Capillary: 178 mg/dL — ABNORMAL HIGH (ref 70–99)
Glucose-Capillary: 156 mg/dL — ABNORMAL HIGH (ref 70–99)

## 2019-08-06 LAB — CBC WITH DIFFERENTIAL/PLATELET
Abs Immature Granulocytes: 0.28 10*3/uL — ABNORMAL HIGH (ref 0.00–0.07)
Basophils Absolute: 0 10*3/uL (ref 0.0–0.1)
Basophils Relative: 0 %
Eosinophils Absolute: 0 10*3/uL (ref 0.0–0.5)
Eosinophils Relative: 0 %
HCT: 24.2 % — ABNORMAL LOW (ref 39.0–52.0)
Hemoglobin: 8.4 g/dL — ABNORMAL LOW (ref 13.0–17.0)
Immature Granulocytes: 2 %
Lymphocytes Relative: 10 %
Lymphs Abs: 1.1 10*3/uL (ref 0.7–4.0)
MCH: 30.7 pg (ref 26.0–34.0)
MCHC: 34.7 g/dL (ref 30.0–36.0)
MCV: 88.3 fL (ref 80.0–100.0)
Monocytes Absolute: 0.4 10*3/uL (ref 0.1–1.0)
Monocytes Relative: 4 %
Neutro Abs: 9.8 10*3/uL — ABNORMAL HIGH (ref 1.7–7.7)
Neutrophils Relative %: 84 %
Platelets: 355 10*3/uL (ref 150–400)
RBC: 2.74 MIL/uL — ABNORMAL LOW (ref 4.22–5.81)
RDW: 14.8 % (ref 11.5–15.5)
WBC: 11.6 10*3/uL — ABNORMAL HIGH (ref 4.0–10.5)
nRBC: 0 % (ref 0.0–0.2)

## 2019-08-06 LAB — AEROBIC/ANAEROBIC CULTURE W GRAM STAIN (SURGICAL/DEEP WOUND)

## 2019-08-06 LAB — COMPREHENSIVE METABOLIC PANEL
ALT: 15 U/L (ref 0–44)
AST: 20 U/L (ref 15–41)
Albumin: 1.3 g/dL — ABNORMAL LOW (ref 3.5–5.0)
Alkaline Phosphatase: 96 U/L (ref 38–126)
Anion gap: 12 (ref 5–15)
BUN: 21 mg/dL — ABNORMAL HIGH (ref 6–20)
CO2: 28 mmol/L (ref 22–32)
Calcium: 7.4 mg/dL — ABNORMAL LOW (ref 8.9–10.3)
Chloride: 93 mmol/L — ABNORMAL LOW (ref 98–111)
Creatinine, Ser: 1.05 mg/dL (ref 0.61–1.24)
GFR calc Af Amer: >60 mL/min (ref >60)
GFR calc non Af Amer: >60 mL/min (ref >60)
Glucose, Bld: 196 mg/dL — ABNORMAL HIGH (ref 70–99)
Potassium: 3.3 mmol/L — ABNORMAL LOW (ref 3.5–5.1)
Sodium: 133 mmol/L — ABNORMAL LOW (ref 135–145)
Total Bilirubin: 0.9 mg/dL (ref 0.3–1.2)
Total Protein: 5.5 g/dL — ABNORMAL LOW (ref 6.5–8.1)

## 2019-08-06 LAB — MAGNESIUM: Magnesium: 1.7 mg/dL (ref 1.7–2.4)

## 2019-08-06 LAB — PHOSPHORUS: Phosphorus: 2.7 mg/dL (ref 2.5–4.6)

## 2019-08-06 MED ORDER — NYSTATIN 100000 UNIT/ML MT SUSP
5.0000 mL | Freq: Four times a day (QID) | OROMUCOSAL | Status: DC
  Administered 2019-08-06 – 2019-08-13 (×14): 500000 [IU] via ORAL
  Filled 2019-08-06 (×36): qty 5

## 2019-08-06 MED ORDER — SODIUM CHLORIDE 0.9% FLUSH
10.0000 mL | INTRAVENOUS | Status: DC | PRN
  Administered 2019-09-14: 10:00:00 10 mL
  Filled 2019-08-06: qty 40

## 2019-08-06 MED ORDER — SODIUM CHLORIDE 0.9% FLUSH
10.0000 mL | Freq: Two times a day (BID) | INTRAVENOUS | Status: DC
  Administered 2019-08-06 – 2019-09-14 (×51): 10 mL
  Administered 2019-09-15: 21:00:00 20 mL
  Administered 2019-09-15 – 2019-10-01 (×31): 10 mL

## 2019-08-06 MED ORDER — ACETAMINOPHEN 325 MG PO TABS
650.0000 mg | ORAL_TABLET | Freq: Four times a day (QID) | ORAL | Status: DC | PRN
  Administered 2019-08-06 – 2019-09-29 (×7): 650 mg via ORAL
  Filled 2019-08-06 (×9): qty 2

## 2019-08-06 MED ORDER — SODIUM CHLORIDE 0.9 % IV SOLN
2.0000 g | INTRAVENOUS | Status: DC
  Administered 2019-08-06 – 2019-09-12 (×221): 2 g via INTRAVENOUS
  Filled 2019-08-06 (×230): qty 2000

## 2019-08-06 MED ORDER — POTASSIUM CHLORIDE CRYS ER 20 MEQ PO TBCR
40.0000 meq | EXTENDED_RELEASE_TABLET | Freq: Two times a day (BID) | ORAL | Status: DC
  Administered 2019-08-06 – 2019-08-08 (×5): 40 meq via ORAL
  Filled 2019-08-06 (×5): qty 2

## 2019-08-06 MED ORDER — NAFCILLIN SODIUM 2 G IJ SOLR
2.0000 g | INTRAMUSCULAR | Status: DC
  Filled 2019-08-06 (×3): qty 2000

## 2019-08-06 NOTE — Progress Notes (Signed)
Midline placed in left/basilic vein without difficulty. 18g x 8cm.

## 2019-08-06 NOTE — Progress Notes (Signed)
Physical Therapy Treatment Patient Details Name: William [name] MRN: [id number] DOB: [birth year] Today's Date: 08/06/2019    History of Present Illness Patient is a 38 y/o male who presents with lower back pain, left shoulder pain, right foot and heel pain. Found to have right heel abscess s/p I&D 9/4, left upper chest wall abscess and mass s/p I&D and wound vac application 9/3, new onset DM, sepsis with MSSA bacteremia due to IV drug use. CT scan- left anterior chest wall soft tissue and intramuscular infection with extension into the thoracic cavity, air in the soft tissues of the right axilla adjacent to the right shoulder, multiple small pulmonary nodules. PMH- heroin IV drug use.    PT Comments    Pt with slow progression towards goals. Limited mobility secondary to increased pain in BLE. Pt requiring min guard to mod A to transfer to chair using RW. Pt requesting to eat breakfast, so further mobility deferred. Anticipate pt will progress well once pain controlled; current recommendations appropriate. Will continue to follow acutely to maximize functional mobility independence and safety.     Follow Up Recommendations  Home health PT;Supervision for mobility/OOB     Equipment Recommendations  Rolling walker with 5" wheels    Recommendations for Other Services       Precautions / Restrictions Precautions Precautions: Fall Precaution Comments: bil knee pain left>right; wound vac Restrictions Weight Bearing Restrictions: No    Mobility  Bed Mobility Overal bed mobility: Needs Assistance Bed Mobility: Supine to Sit     Supine to sit: HOB elevated;Min assist     General bed mobility comments: Min A for trunk elevation. Pt requesting the HOB be elevated.   Transfers Overall transfer level: Needs assistance Equipment used: Rolling walker (2 wheeled) Transfers: Sit to/from [employer] CorporationStand;Stand Pivot Transfers Sit to Stand: Mod assist;From elevated surface Stand pivot transfers: Min  guard       General transfer comment: Mod A for lift assist and steadying to stand. Cues for safe hand placement. Min guard for safety and line management to perform transfer to chair. Pt grimacing throughout and requesting to eat breakfast, so further mobility deferred.   Ambulation/Gait                 Stairs             Wheelchair Mobility    Modified Rankin (Stroke Patients Only)       Balance Overall balance assessment: Needs assistance Sitting-balance support: Feet supported;Bilateral upper extremity supported Sitting balance-Leahy Scale: Fair Sitting balance - Comments: Requires BUE support as position of comfort.   Standing balance support: Bilateral upper extremity supported;During functional activity Standing balance-Leahy Scale: Poor Standing balance comment: Reliant on BUEs for support in standing.                            Cognition Arousal/Alertness: Awake/alert Behavior During Therapy: Anxious Overall Cognitive Status: Within Functional Limits for tasks assessed                                 General Comments: Pt slightly anxious throughout mobility tasks.       Exercises      General Comments        Pertinent Vitals/Pain Pain Assessment: Faces Faces Pain Scale: Hurts even more Pain Location: BLE Pain Descriptors / Indicators: Sore;Guarding;Discomfort;Grimacing Pain Intervention(s): Limited activity within patient's tolerance;Monitored during  session;Repositioned    Home Living                      Prior Function            PT Goals (current goals can now be found in the care plan section) Acute Rehab PT Goals Patient Stated Goal: to eat breakfast PT Goal Formulation: With patient Time For Goal Achievement: 08/17/19 Potential to Achieve Goals: Good Progress towards PT goals: Progressing toward goals    Frequency    Min 3X/week      PT Plan Current plan remains appropriate     Co-evaluation              AM-PAC PT "6 Clicks" Mobility   Outcome Measure  Help needed turning from your back to your side while in a flat bed without using bedrails?: A Little Help needed moving from lying on your back to sitting on the side of a flat bed without using bedrails?: A Lot Help needed moving to and from a bed to a chair (including a wheelchair)?: A Little Help needed standing up from a chair using your arms (e.g., wheelchair or bedside chair)?: A Lot Help needed to walk in hospital room?: A Little Help needed climbing 3-5 steps with a railing? : A Lot 6 Click Score: 15    End of Session Equipment Utilized During Treatment: Gait belt Activity Tolerance: Patient limited by pain Patient left: in chair;with call bell/phone within reach Nurse Communication: Mobility status PT Visit Diagnosis: Pain;Unsteadiness on feet (R26.81);Muscle weakness (generalized) (M62.81);Difficulty in walking, not elsewhere classified (R26.2) Pain - Right/Left: (bilateral) Pain - part of body: Knee     Time: 5456-2563 PT Time Calculation (min) (ACUTE ONLY): 19 min  Charges:  $Therapeutic Activity: 8-22 mins                     Leighton Ruff, PT, DPT  Acute Rehabilitation Services  Pager: [phone] Office: [phone]    Rudean Hitt 08/06/2019, 11:58 AM

## 2019-08-06 NOTE — Progress Notes (Signed)
PROGRESS NOTE    William [name]  ZOX:[id number] DOB: 1981/03/01 DOA: 07/31/2019 PCP: Patient, No Pcp Per    Brief Narrative:  38 year old no previous medical history, heroin injection for last 10 years presented to emergency room on 9/1 with multiple complaints including lower back pain, left shoulder pain, right foot and heel pain.  In the emergency room, he has right heel abscess, left upper chest wall abscess and mass, CT scan noted left anterior chest wall soft tissue and intramuscular infection with extension into the thoracic cavity, air in the soft tissues of the right axilla adjacent to the right shoulder, multiple small pulmonary nodules.  Patient was admitted to ICU.  Underwent surgical debridement of the chest wall abscess and wound VAC placement.  Also found to have new diagnosis of diabetes.  MRI showed multiple epidural and paraspinal abscesses. Blood cultures 9/2: MSSA Blood cultures 9/3: Pending, no growth so far. Wound culture 9/2: MSSA. Right heel culture 9/3: Abundant WBC.  MSSA. Repeat c/s : 9/3,9/6 negative so far.   Assessment & Plan:   Principal Problem:   Sepsis (HCC) Active Problems:   Abscess of paraspinous muscles   Osteomyelitis of lumbar spine (HCC)   Epidural abscess of spine due to infective embolism   Abnormal chest CT   Septic embolism (HCC)   Community acquired pneumonia   IV drug abuse (HCC)   Heroin abuse (HCC)   Elevated troponin   UTI (urinary tract infection)   Renal failure   Hyperglycemia   Suspected endocarditis   Staphylococcus aureus bacteremia   Chest wall abscess  Sepsis present on admission with MSSA bacteremia due to IV drug use: Septic pulmonary emboli, Left chest wall abscess, Right heel abscess, Extensive right posterolateral epidural abscess L2-L3, Osteomyelitis of the L5-S1, Posterior epidural abscess L5-S1 S2. - Status post incision and drainage and debridement left chest wall on wound VAC, followed by CT CS. - Bedside I&D  right heel, MSSA. - Multiple epidural abscess and discitis, seen by neurosurgery recommended no surgical drainage but antibiotics. - followed by infectious disease currently remains on cefazolin and metronidazole.  Repeat cultures were negative. -Temperature trending down.  Knee pain is better today. - Intraoperative TEE with no evidence of endocarditis. - Anticipate intravenous antibiotics for about 6 to 8 weeks.  Patient has exhausted IV access.  Will consult for PICC line.  IV drug use: Ongoing opiate use.  Patient is motivated to quit.  Will provide symptomatic treatment.  Opiates for pain relief for postoperative pain.  Will use Tylenol and muscle relaxants. Use metoprolol for tachycardia. Patient to use Xanax for craving and anxiety.  New onset diabetes: A1c 7.8.  Started on insulin.  Stabilizing on increased dose of insulin. Keep on sliding scale insulin.  Stay on current regimen today.  MiraLAX and Colace for constipation. Advised ambulation.   DVT prophylaxis: Heparin subcu Code Status: Full code Family Communication: None Disposition Plan: Inpatient   Consultants:   PCCM, transfer out  CT CS, following  Neurosurgery, signed off  Orthopedics, signed off  Procedures:   Chest wall abscess incision and drainage, 08/02/2019  Right heel incision and drainage, 08/03/2019  Antimicrobials:   Vancomycin, 08/01/2019>>> 08/02/2019  Cefepime, 08/01/2019>>> 08/02/2019  Cefazolin, 08/02/2019>>>>>  Flagyl, 08/02/2019>>>  Diflucan, on 08/03/2019 dose  Nystatin swish and swallow, 08/03/2019 >>>   Subjective: Patient seen and examined.  No overnight events.  Temperature maximum 100. Knee pain is better now.  He had good response to MiraLAX and Colace.  Objective:  Vitals:   08/05/19 1640 08/05/19 2121 08/06/19 0313 08/06/19 0700  BP: 135/75 (!) 142/82 134/87 136/85  Pulse: 91 95 93 97  Resp: 20 18 18 18   Temp:  100 F (37.8 C) 98 F (36.7 C) 99.2 F (37.3 C)  TempSrc:  Oral Oral  Axillary  SpO2: 99% 100% 100% 100%  Weight:      Height:        Intake/Output Summary (Last 24 hours) at 08/06/2019 1043 Last data filed at 08/06/2019 0600 Gross per 24 hour  Intake 480 ml  Output 1200 ml  Net -720 ml   Filed Weights   08/03/19 0500 08/04/19 0500 08/05/19 0500  Weight: 89.3 kg 84 kg 84.3 kg    Examination:  General exam: Comfortable.  Without any distress.  Afebrile.   Respiratory system: Clear to auscultation. Respiratory effort normal. Cardiovascular system: S1 & S2 heard, RRR. No JVD, murmurs, rubs, gallops or clicks. No pedal edema. Left anterior chest wall wound with wound VAC, draining pus. Gastrointestinal system: Abdomen is nondistended, soft and nontender. No organomegaly or masses felt. Normal bowel sounds heard. Central nervous system: Alert and oriented. No focal neurological deficits. Extremities: Symmetric 5 x 5 power.  No sensory deficits. Skin: No rashes, lesions.  Right heel dressing intact.  Dry and clean. Bilateral knee and other joints with no effusion, no redness or erythema. Psychiatry: Judgement and insight appear normal. Mood & affect normal.    Data Reviewed: I have personally reviewed following labs and imaging studies  CBC: Recent Labs  Lab 07/31/19 2302 08/01/19 0909 08/01/19 0929 08/02/19 0309 08/04/19 0352 08/06/19 0738  WBC 19.3*  --  13.7* 13.4* 12.1* 11.6*  NEUTROABS 18.5*  --   --   --  9.9* 9.8*  HGB 11.1* 9.5* 8.9* 9.1* 9.7* 8.4*  HCT 32.3* 28.0* 25.8* 24.8* 28.4* 24.2*  MCV 88.7  --  87.2 84.6 88.5 88.3  PLT 347  --  291 336 450* 355   Basic Metabolic Panel: Recent Labs  Lab 07/31/19 2302 08/01/19 0909 08/01/19 0929 08/04/19 0352 08/06/19 0738  NA 120* 133* 134* 134* 133*  K 6.0* 4.1 4.5 3.0* 3.3*  CL 79*  --  97* 95* 93*  CO2 27  --  26 27 28   GLUCOSE 701*  --  179* 190* 196*  BUN 53*  --  39* 28* 21*  CREATININE 1.62*  --  1.25* 1.24 1.05  CALCIUM 8.4*  --  8.0* 8.0* 7.4*  MG  --   --  2.1 1.9 1.7   PHOS  --   --  3.5 3.9 2.7   GFR: Estimated Creatinine Clearance: 110.9 mL/min (by C-G formula based on SCr of 1.05 mg/dL). Liver Function Tests: Recent Labs  Lab 07/31/19 2302 08/01/19 0929 08/06/19 0738  AST 19 20 20   ALT 25 18 15   ALKPHOS 276* 178* 96  BILITOT 2.7* 2.6* 0.9  PROT 6.9 5.3* 5.5*  ALBUMIN 1.7* 1.4* 1.3*   No results for input(s): LIPASE, AMYLASE in the last 168 hours. No results for input(s): AMMONIA in the last 168 hours. Coagulation Profile: Recent Labs  Lab 07/31/19 2302 08/01/19 0929  INR 1.3* 1.3*   Cardiac Enzymes: Recent Labs  Lab 07/31/19 2302  CKTOTAL 71   BNP (last 3 results) No results for input(s): PROBNP in the last 8760 hours. HbA1C: No results for input(s): HGBA1C in the last 72 hours. CBG: Recent Labs  Lab 08/05/19 0626 08/05/19 1139 08/05/19 1645 08/05/19 2119 08/06/19 0272  GLUCAP 167* 136* 113* 135* 259*   Lipid Profile: No results for input(s): CHOL, HDL, LDLCALC, TRIG, CHOLHDL, LDLDIRECT in the last 72 hours. Thyroid Function Tests: No results for input(s): TSH, T4TOTAL, FREET4, T3FREE, THYROIDAB in the last 72 hours. Anemia Panel: No results for input(s): VITAMINB12, FOLATE, FERRITIN, TIBC, IRON, RETICCTPCT in the last 72 hours. Sepsis Labs: Recent Labs  Lab 07/31/19 2302 08/01/19 0058  LATICACIDVEN 3.7* 3.5*    Recent Results (from the past 240 hour(s))  Urine culture     Status: Abnormal   Collection Time: 07/31/19 10:58 PM   Specimen: In/Out Cath Urine  Result Value Ref Range Status   Specimen Description IN/OUT CATH URINE  Final   Special Requests   Final    NONE Performed at [facility], [address], [place], [place] [address]    Culture >=100,000 COLONIES/mL STAPHYLOCOCCUS AUREUS (A)  Final   Report Status 08/02/2019 FINAL  Final   Organism ID, Bacteria STAPHYLOCOCCUS AUREUS (A)  Final      Susceptibility   Staphylococcus aureus - MIC*    CIPROFLOXACIN 1 SENSITIVE Sensitive      GENTAMICIN <=0.5 SENSITIVE Sensitive     NITROFURANTOIN <=16 SENSITIVE Sensitive     OXACILLIN 0.5 SENSITIVE Sensitive     TETRACYCLINE <=1 SENSITIVE Sensitive     VANCOMYCIN <=0.5 SENSITIVE Sensitive     TRIMETH/SULFA <=10 SENSITIVE Sensitive     CLINDAMYCIN <=0.25 SENSITIVE Sensitive     RIFAMPIN <=0.5 SENSITIVE Sensitive     Inducible Clindamycin NEGATIVE Sensitive     * >=100,000 COLONIES/mL STAPHYLOCOCCUS AUREUS  Blood culture (routine x 2)     Status: Abnormal   Collection Time: 08/01/19  1:36 AM   Specimen: BLOOD  Result Value Ref Range Status   Specimen Description BLOOD RIGHT ANTECUBITAL  Final   Special Requests   Final    BOTTLES DRAWN AEROBIC AND ANAEROBIC Blood Culture adequate volume   Culture  Setup Time   Final    GRAM POSITIVE COCCI IN BOTH AEROBIC AND ANAEROBIC BOTTLES CRITICAL RESULT CALLED TO, READ BACK BY AND VERIFIED WITH: Ihor Austin PharmD 17:20 08/01/19 ([email or web address]) Performed at [facility], [address], [place], [place] [address]    Culture STAPHYLOCOCCUS AUREUS (A)  Final   Report Status 08/03/2019 FINAL  Final   Organism ID, Bacteria STAPHYLOCOCCUS AUREUS  Final      Susceptibility   Staphylococcus aureus - MIC*    CIPROFLOXACIN <=0.5 SENSITIVE Sensitive     ERYTHROMYCIN <=0.25 SENSITIVE Sensitive     GENTAMICIN <=0.5 SENSITIVE Sensitive     OXACILLIN <=0.25 SENSITIVE Sensitive     TETRACYCLINE <=1 SENSITIVE Sensitive     VANCOMYCIN 1 SENSITIVE Sensitive     TRIMETH/SULFA <=10 SENSITIVE Sensitive     CLINDAMYCIN <=0.25 SENSITIVE Sensitive     RIFAMPIN <=0.5 SENSITIVE Sensitive     Inducible Clindamycin NEGATIVE Sensitive     * STAPHYLOCOCCUS AUREUS  Blood Culture ID Panel (Reflexed)     Status: Abnormal   Collection Time: 08/01/19  1:36 AM  Result Value Ref Range Status   Enterococcus species NOT DETECTED NOT DETECTED Final   Listeria monocytogenes NOT DETECTED NOT DETECTED Final   Staphylococcus species DETECTED (A) NOT DETECTED Final     Comment: CRITICAL RESULT CALLED TO, READ BACK BY AND VERIFIED WITH: Ihor Austin PharmD 17:20 08/01/19 ([email or web address])    Staphylococcus aureus (BCID) DETECTED (A) NOT DETECTED Final    Comment: Methicillin (oxacillin) susceptible Staphylococcus aureus (  MSSA). Preferred therapy is anti staphylococcal beta lactam antibiotic (Cefazolin or Nafcillin), unless clinically contraindicated. CRITICAL RESULT CALLED TO, READ BACK BY AND VERIFIED WITH: Ihor Austin PharmD 17:20 08/01/19 ([email or web address])    Methicillin resistance NOT DETECTED NOT DETECTED Final   Streptococcus species NOT DETECTED NOT DETECTED Final   Streptococcus agalactiae NOT DETECTED NOT DETECTED Final   Streptococcus pneumoniae NOT DETECTED NOT DETECTED Final   Streptococcus pyogenes NOT DETECTED NOT DETECTED Final   Acinetobacter baumannii NOT DETECTED NOT DETECTED Final   Enterobacteriaceae species NOT DETECTED NOT DETECTED Final   Enterobacter cloacae complex NOT DETECTED NOT DETECTED Final   Escherichia coli NOT DETECTED NOT DETECTED Final   Klebsiella oxytoca NOT DETECTED NOT DETECTED Final   Klebsiella pneumoniae NOT DETECTED NOT DETECTED Final   Proteus species NOT DETECTED NOT DETECTED Final   Serratia marcescens NOT DETECTED NOT DETECTED Final   Haemophilus influenzae NOT DETECTED NOT DETECTED Final   Neisseria meningitidis NOT DETECTED NOT DETECTED Final   Pseudomonas aeruginosa NOT DETECTED NOT DETECTED Final   Candida albicans NOT DETECTED NOT DETECTED Final   Candida glabrata NOT DETECTED NOT DETECTED Final   Candida krusei NOT DETECTED NOT DETECTED Final   Candida parapsilosis NOT DETECTED NOT DETECTED Final   Candida tropicalis NOT DETECTED NOT DETECTED Final    Comment: Performed at [facility], [address], [place], [place] [address]  SARS Coronavirus 2 [facility] order, Performed in [facility] hospital lab) Nasopharyngeal Abscess     Status: None   Collection Time: 08/01/19  2:10 AM   Specimen: Abscess; Nasopharyngeal   Result Value Ref Range Status   SARS Coronavirus 2 NEGATIVE NEGATIVE Final    Comment: (NOTE) If result is NEGATIVE SARS-CoV-2 target nucleic acids are NOT DETECTED. The SARS-CoV-2 RNA is generally detectable in upper and lower  respiratory specimens during the acute phase of infection. The lowest  concentration of SARS-CoV-2 viral copies this assay can detect is 250  copies / mL. A negative result does not preclude SARS-CoV-2 infection  and should not be used as the sole basis for treatment or other  patient management decisions.  A negative result may occur with  improper specimen collection / handling, submission of specimen other  than nasopharyngeal swab, presence of viral mutation(s) within the  areas targeted by this assay, and inadequate number of viral copies  (<250 copies / mL). A negative result must be combined with clinical  observations, patient history, and epidemiological information. If result is POSITIVE SARS-CoV-2 target nucleic acids are DETECTED. The SARS-CoV-2 RNA is generally detectable in upper and lower  respiratory specimens dur ing the acute phase of infection.  Positive  results are indicative of active infection with SARS-CoV-2.  Clinical  correlation with patient history and other diagnostic information is  necessary to determine patient infection status.  Positive results do  not rule out bacterial infection or co-infection with other viruses. If result is PRESUMPTIVE POSTIVE SARS-CoV-2 nucleic acids MAY BE PRESENT.   A presumptive positive result was obtained on the submitted specimen  and confirmed on repeat testing.  While 2019 novel coronavirus  (SARS-CoV-2) nucleic acids may be present in the submitted sample  additional confirmatory testing may be necessary for epidemiological  and / or clinical management purposes  to differentiate between  SARS-CoV-2 and other Sarbecovirus currently known to infect humans.  If clinically indicated additional  testing with an alternate test  methodology [phone]) is advised. The SARS-CoV-2 RNA is generally  detectable in upper  and lower respiratory sp ecimens during the acute  phase of infection. The expected result is Negative. Fact Sheet for Patients:  BoilerBrush.com.cyhttps://www.fda.gov/media/136312/download Fact Sheet for Healthcare Providers: https://pope.com/https://www.fda.gov/media/136313/download This test is not yet approved or cleared by the [place] FDA and has been authorized for detection and/or diagnosis of SARS-CoV-2 by FDA under an Emergency Use Authorization (EUA).  This EUA will remain in effect (meaning this test can be used) for the duration of the COVID-19 declaration under Section 564(b)(1) of the Act, 21 U.S.C. section 360bbb-3(b)(1), unless the authorization is terminated or revoked sooner. Performed at [facility], [address] [place], [place] [address]   Wound or Superficial Culture     Status: None   Collection Time: 08/01/19  2:10 AM   Specimen: Abscess; Wound  Result Value Ref Range Status   Specimen Description ABSCESS  Final   Special Requests NONE  Final   Gram Stain   Final    RARE WBC PRESENT, PREDOMINANTLY PMN MODERATE GRAM POSITIVE COCCI IN CLUSTERS Performed at [facility], [address] [place], [place] [address]    Culture ABUNDANT STAPHYLOCOCCUS AUREUS  Final   Report Status 08/03/2019 FINAL  Final   Organism ID, Bacteria STAPHYLOCOCCUS AUREUS  Final      Susceptibility   Staphylococcus aureus - MIC*    CIPROFLOXACIN <=0.5 SENSITIVE Sensitive     ERYTHROMYCIN <=0.25 SENSITIVE Sensitive     GENTAMICIN <=0.5 SENSITIVE Sensitive     OXACILLIN <=0.25 SENSITIVE Sensitive     TETRACYCLINE <=1 SENSITIVE Sensitive     VANCOMYCIN <=0.5 SENSITIVE Sensitive     TRIMETH/SULFA <=10 SENSITIVE Sensitive     CLINDAMYCIN <=0.25 SENSITIVE Sensitive     RIFAMPIN <=0.5 SENSITIVE Sensitive     Inducible Clindamycin NEGATIVE Sensitive     * ABUNDANT STAPHYLOCOCCUS  AUREUS  Blood culture (routine x 2)     Status: Abnormal   Collection Time: 08/01/19  2:40 AM   Specimen: BLOOD LEFT ARM  Result Value Ref Range Status   Specimen Description BLOOD LEFT ARM  Final   Special Requests   Final    BOTTLES DRAWN AEROBIC AND ANAEROBIC Blood Culture adequate volume   Culture  Setup Time   Final    GRAM POSITIVE COCCI IN BOTH AEROBIC AND ANAEROBIC BOTTLES CRITICAL VALUE NOTED.  VALUE IS CONSISTENT WITH PREVIOUSLY REPORTED AND CALLED VALUE.    Culture (A)  Final    STAPHYLOCOCCUS AUREUS SUSCEPTIBILITIES PERFORMED ON PREVIOUS CULTURE WITHIN THE LAST 5 DAYS. Performed at [facility], [address] [place], [place] [address]    Report Status 08/03/2019 FINAL  Final  MRSA PCR Screening     Status: None   Collection Time: 08/01/19  7:28 AM   Specimen: Nasal Mucosa; Nasopharyngeal  Result Value Ref Range Status   MRSA by PCR NEGATIVE NEGATIVE Final    Comment:        The GeneXpert MRSA Assay (FDA approved for NASAL specimens only), is one component of a comprehensive MRSA colonization surveillance program. It is not intended to diagnose MRSA infection nor to guide or monitor treatment for MRSA infections. Performed at [facility], [address] [place], [place] [address]   Aerobic/Anaerobic Culture (surgical/deep wound)     Status: None (Preliminary result)   Collection Time: 08/01/19  1:38 PM   Specimen: Abscess  Result Value Ref Range Status   Specimen Description ABSCESS LEFT CHEST WALL  Final   Special Requests PATIENT  ON FOLLOWING MAXIPINE ANCEF  Final   Gram Stain   Final    ABUNDANT WBC PRESENT, PREDOMINANTLY PMN ABUNDANT GRAM POSITIVE COCCI Performed at [facility], [address] [place], [place] [address]    Culture   Final    ABUNDANT STAPHYLOCOCCUS AUREUS NO ANAEROBES ISOLATED; CULTURE IN PROGRESS FOR 5 DAYS    Report Status PENDING  Incomplete   Organism ID, Bacteria STAPHYLOCOCCUS AUREUS  Final       Susceptibility   Staphylococcus aureus - MIC*    CIPROFLOXACIN <=0.5 SENSITIVE Sensitive     ERYTHROMYCIN <=0.25 SENSITIVE Sensitive     GENTAMICIN <=0.5 SENSITIVE Sensitive     OXACILLIN <=0.25 SENSITIVE Sensitive     TETRACYCLINE <=1 SENSITIVE Sensitive     VANCOMYCIN 1 SENSITIVE Sensitive     TRIMETH/SULFA <=10 SENSITIVE Sensitive     CLINDAMYCIN <=0.25 SENSITIVE Sensitive     RIFAMPIN <=0.5 SENSITIVE Sensitive     Inducible Clindamycin NEGATIVE Sensitive     * ABUNDANT STAPHYLOCOCCUS AUREUS  Culture, blood (routine x 2)     Status: None (Preliminary result)   Collection Time: 08/02/19 10:26 AM   Specimen: BLOOD RIGHT ARM  Result Value Ref Range Status   Specimen Description BLOOD RIGHT ARM  Final   Special Requests   Final    BOTTLES DRAWN AEROBIC AND ANAEROBIC Blood Culture adequate volume   Culture   Final    NO GROWTH 4 DAYS Performed at [facility], [address] [place], [place] [address]    Report Status PENDING  Incomplete  Culture, blood (routine x 2)     Status: None (Preliminary result)   Collection Time: 08/02/19 10:26 AM   Specimen: BLOOD LEFT ARM  Result Value Ref Range Status   Specimen Description BLOOD LEFT ARM  Final   Special Requests   Final    BOTTLES DRAWN AEROBIC ONLY Blood Culture adequate volume   Culture   Final    NO GROWTH 4 DAYS Performed at [facility], [address] [place], [place] [address]    Report Status PENDING  Incomplete  Aerobic Culture (superficial specimen)     Status: None   Collection Time: 08/03/19 11:24 AM   Specimen: Heel; Wound  Result Value Ref Range Status   Specimen Description HEEL  Final   Special Requests Normal  Final   Gram Stain   Final    ABUNDANT WBC PRESENT, PREDOMINANTLY PMN ABUNDANT GRAM POSITIVE COCCI Performed at [facility], [address] [place], [place] [address]    Culture ABUNDANT STAPHYLOCOCCUS AUREUS  Final   Report Status 08/05/2019 FINAL  Final   Organism ID,  Bacteria STAPHYLOCOCCUS AUREUS  Final      Susceptibility   Staphylococcus aureus - MIC*    CIPROFLOXACIN <=0.5 SENSITIVE Sensitive     ERYTHROMYCIN <=0.25 SENSITIVE Sensitive     GENTAMICIN <=0.5 SENSITIVE Sensitive     OXACILLIN <=0.25 SENSITIVE Sensitive     TETRACYCLINE <=1 SENSITIVE Sensitive     VANCOMYCIN 1 SENSITIVE Sensitive     TRIMETH/SULFA <=10 SENSITIVE Sensitive     CLINDAMYCIN <=0.25 SENSITIVE Sensitive     RIFAMPIN <=0.5 SENSITIVE Sensitive     Inducible Clindamycin NEGATIVE Sensitive     * ABUNDANT STAPHYLOCOCCUS AUREUS  Culture, blood (routine x 2)     Status: None (Preliminary result)   Collection Time: 08/05/19 11:03 AM   Specimen: BLOOD RIGHT HAND  Result Value Ref Range Status  Specimen Description BLOOD RIGHT HAND  Final   Special Requests   Final    BOTTLES DRAWN AEROBIC ONLY Blood Culture results may not be optimal due to an inadequate volume of blood received in culture bottles   Culture   Final    NO GROWTH < 24 HOURS Performed at [place] [address], [place], [place] [address]    Report Status PENDING  Incomplete  Culture, blood (routine x 2)     Status: None (Preliminary result)   Collection Time: 08/05/19 11:04 AM   Specimen: BLOOD LEFT HAND  Result Value Ref Range Status   Specimen Description BLOOD LEFT HAND  Final   Special Requests   Final    BOTTLES DRAWN AEROBIC AND ANAEROBIC Blood Culture adequate volume   Culture   Final    NO GROWTH < 24 HOURS Performed at [facility], [place] [address], [place], [place] [address]    Report Status PENDING  Incomplete         Radiology Studies: No results found.      Scheduled Meds: . Chlorhexidine Gluconate Cloth  6 each Topical Daily  . docusate sodium  100 mg Oral Daily  . feeding supplement (GLUCERNA SHAKE)  237 mL Oral TID BM  . heparin  5,000 Units Subcutaneous Q8H  . insulin aspart  0-15 Units Subcutaneous TID WC  . insulin aspart  0-5 Units Subcutaneous QHS  .  insulin glargine  18 Units Subcutaneous Daily  . metoprolol tartrate  12.5 mg Oral BID  . metroNIDAZOLE  500 mg Oral Q8H  . nystatin  5 mL Oral QID  . ondansetron  4 mg Intravenous Once  . polyethylene glycol  17 g Oral Daily  . potassium chloride  40 mEq Oral BID  . ramelteon  8 mg Oral QHS  . senna-docusate  1 tablet Oral BID   Continuous Infusions: .  ceFAZolin (ANCEF) IV 2 g (08/06/19 0519)     LOS: 5 days    Time spent: 28 minutes    Barb Merino, MD [facility] Pager [phone]  If 7PM-7AM, please contact night-coverage [email or web address] Password [facility] 08/06/2019, 10:43 AM

## 2019-08-06 NOTE — Consult Note (Signed)
[place] Nurse wound consult note Patient receiving care in [facility] [phone]. Reason for Consult: Left [place] joint VAC change Wound type: infectious Wound bed: pink, full of pus Drainage (amount, consistency, odor) purulent Periwound: intact Dressing procedure/placement/frequency: one piece of black foam removed from wound, one piece placed as deep into the wound space as possible where the pocket of pus was.  Immediate seal obtained. Val Riles, RN, MSN, CWOCN, CNS-BC, pager [phone]

## 2019-08-06 NOTE — Progress Notes (Signed)
Spoke with Lannette Donath RN in regards to IV needs. It has been recommended that the patient continue with antibiotics for 6-8 weeks. Would suggest PICC placement. RN will contact MD. Thank you.

## 2019-08-06 NOTE — Progress Notes (Signed)
Pt noted to have low grade temp; incentive spirometer given and education completed with at bedside; pt voices understanding and able to demonstrate back to RN but pt needs encouragement to continue to use IS and deep breathe. MD notified and new order received. Will continue to closely monitor pt. Delia Heady RN

## 2019-08-06 NOTE — Progress Notes (Addendum)
      [address]       [place] [address]             [phone]      5 Days Post-Op Procedure(s) (LRB): I&D Left Chest wall abscess with application of wound vac (Left) TRANSESOPHAGEAL ECHOCARDIOGRAM (TEE) (N/A)   Subjective:  Doing better today, not as much discomfort/anxiety as yesterday.  Undergoing evaluation by IV team for possible Midline catheter placement  Objective: Vital signs in last 24 hours: Temp:  [97.8 F (36.6 C)-100 F (37.8 C)] 99.2 F (37.3 C) (09/07 0700) Pulse Rate:  [83-97] 97 (09/07 0700) Resp:  [18-20] 18 (09/07 0700) BP: (121-142)/(74-87) 136/85 (09/07 0700) SpO2:  [99 %-100 %] 100 % (09/07 0700)  Intake/Output from previous day: 09/06 0701 - 09/07 0700 In: 800 [P.O.:800] Out: 1600 [Urine:1600]  General appearance: alert, cooperative and no distress Heart: regular rate and rhythm Lungs: clear to auscultation bilaterally Wound: wound vac in place  Lab Results: Recent Labs    08/04/19 0352 08/06/19 0738  WBC 12.1* 11.6*  HGB 9.7* 8.4*  HCT 28.4* 24.2*  PLT 450* 355   BMET:  Recent Labs    08/04/19 0352 08/06/19 0738  NA 134* 133*  K 3.0* 3.3*  CL 95* 93*  CO2 27 28  GLUCOSE 190* 196*  BUN 28* 21*  CREATININE 1.24 1.05  CALCIUM 8.0* 7.4*    PT/INR: No results for input(s): LABPROT, INR in the last 72 hours. ABG    Component Value Date/Time   PHART 7.482 (H) 08/01/2019 0909   HCO3 31.0 (H) 08/01/2019 0909   TCO2 32 08/01/2019 0909   O2SAT 97.0 08/01/2019 0909   CBG (last 3)  Recent Labs    08/05/19 1645 08/05/19 2119 08/06/19 0607  GLUCAP 113* 135* 259*    Assessment/Plan: S/P Procedure(s) (LRB): I&D Left Chest wall abscess with application of wound vac (Left) TRANSESOPHAGEAL ECHOCARDIOGRAM (TEE) (N/A)  1. S/p I &D of [place] [facility]- for wound vac change today by wound care, continues to have purulent drainage in cannister 2. ID-  Low grade temp today, no leukocytosis, on ABX per primary 3.  Dispo- wound vac change today, ABX per primary   LOS: 5 days    Ellwood Handler 08/06/2019   patient examined and medical record reviewed,agree with above note. Tharon Aquas Trigt III 08/06/2019

## 2019-08-06 NOTE — Progress Notes (Signed)
[facility]  Date of Admission:  07/31/2019      Total days of antibiotics 7  Day 6 Cefazolin   Day 5 Metronidazole            ASSESSMENT: William [name] is a 38 y.o. male with history of recent/active injection drug use here for sequela of disseminated MSSA bacteremia including R heel superficial abscess, L [place] joint & chest wall abscess, L2-L3 and L4-5 discitis/epidural abscess. TEE was determined to be negative for endocarditis. He has cleared his bacteremia and now POD 5 following chest wall debridement; R heel cultured out MSSA as well   He has continued to have fevers now on treatment day 7 which is concerning for an uncontrolled source. His left knee, although improved today have swelling with tenderness to palpation anteriorly with warmth concerning for septic joint. Would have orthopedic team evaluate for consideration of arthrocentesis to confirm infection.  Alternatively CT surgery team noted some purulence in canister at chest wall vac dressing and is planned to be changed today to explore further.     PLAN: 1. Would ask ortho to re-consult to assess L knee and reassess R heel (seems that he was unable to tolerate complete bedside procedure d/t pain) 2. Follow up findings on VAC dressing change today (?purulence) 3. Continue cefazolin + metronidazole  4. Follow WBC / temp curve    Principal Problem:   Sepsis ([place]) Active Problems:   Abscess of paraspinous muscles   Osteomyelitis of lumbar spine (HCC)   Epidural abscess of spine due to infective embolism   Abnormal chest CT   Septic embolism ([place])   Community acquired pneumonia   IV drug abuse ([place])   Heroin abuse ([place])   Elevated troponin   UTI (urinary tract infection)   Renal failure   Hyperglycemia   Suspected endocarditis   Staphylococcus aureus bacteremia   Chest wall abscess   . Chlorhexidine Gluconate Cloth  6 each Topical Daily  . docusate sodium  100 mg Oral  Daily  . feeding supplement (GLUCERNA SHAKE)  237 mL Oral TID BM  . heparin  5,000 Units Subcutaneous Q8H  . insulin aspart  0-15 Units Subcutaneous TID WC  . insulin aspart  0-5 Units Subcutaneous QHS  . insulin glargine  18 Units Subcutaneous Daily  . metoprolol tartrate  12.5 mg Oral BID  . metroNIDAZOLE  500 mg Oral Q8H  . nystatin  5 mL Oral QID  . ondansetron  4 mg Intravenous Once  . polyethylene glycol  17 g Oral Daily  . potassium chloride  40 mEq Oral BID  . ramelteon  8 mg Oral QHS  . senna-docusate  1 tablet Oral BID  . sodium chloride flush  10-40 mL Intracatheter Q12H    SUBJECTIVE: His knee pain is improved from yesterday but still with "catching" and "tendernss with certain positions." No new complaints/concerns. His back pain is improved since starting IV antibiotics.   Temps O/N still > 100.4 F. WBC 11K  Review of Systems: Review of Systems  Constitutional: Positive for chills, diaphoresis and fever.  Respiratory: Negative for cough.   Cardiovascular: Positive for leg swelling (L leg). Negative for chest pain.  Gastrointestinal: Negative for abdominal pain, diarrhea and vomiting.  Genitourinary: Negative for dysuria.  Musculoskeletal: Positive for back pain and joint pain.  Neurological: Negative for weakness and headaches.    No Known Allergies  OBJECTIVE: Vitals:   08/05/19 2121  08/06/19 0313 08/06/19 0700 08/06/19 1136  BP: (!) 142/82 134/87 136/85 130/73  Pulse: 95 93 97 95  Resp: 18 18 18 18   Temp:  98 F (36.7 C) 99.2 F (37.3 C) (!) 100.8 F (38.2 C)  TempSrc: Oral Oral Axillary Oral  SpO2: 100% 100% 100% 99%  Weight:      Height:       Body mass index is 23.86 kg/m.  Physical Exam Vitals signs reviewed.  Constitutional:      Comments: Resting in bed quietly   HENT:     Mouth/Throat:     Mouth: Mucous membranes are moist.     Pharynx: Oropharynx is clear.  Eyes:     General: No scleral icterus. Neck:     Musculoskeletal: Normal  range of motion.  Cardiovascular:     Rate and Rhythm: Normal rate.  Pulmonary:     Effort: Pulmonary effort is normal.     Breath sounds: Normal breath sounds.  Musculoskeletal:     Left knee: He exhibits swelling. He exhibits no effusion. Tenderness found.       Arms:     Comments: + warmth left knee. Slow range of motion but when he extends knee from 90 degree flexed position he has pain and difficulty completing this movement.      Lab Results Lab Results  Component Value Date   WBC 11.6 (H) 08/06/2019   HGB 8.4 (L) 08/06/2019   HCT 24.2 (L) 08/06/2019   MCV 88.3 08/06/2019   PLT 355 08/06/2019    Lab Results  Component Value Date   CREATININE 1.05 08/06/2019   BUN 21 (H) 08/06/2019   NA 133 (L) 08/06/2019   K 3.3 (L) 08/06/2019   CL 93 (L) 08/06/2019   CO2 28 08/06/2019    Lab Results  Component Value Date   ALT 15 08/06/2019   AST 20 08/06/2019   ALKPHOS 96 08/06/2019   BILITOT 0.9 08/06/2019     Microbiology: Recent Results (from the past 240 hour(s))  Urine culture     Status: Abnormal   Collection Time: 07/31/19 10:58 PM   Specimen: In/Out Cath Urine  Result Value Ref Range Status   Specimen Description IN/OUT CATH URINE  Final   Special Requests   Final    NONE Performed at [facility], [address], [place], [place] [address]    Culture >=100,000 COLONIES/mL STAPHYLOCOCCUS AUREUS (A)  Final   Report Status 08/02/2019 FINAL  Final   Organism ID, Bacteria STAPHYLOCOCCUS AUREUS (A)  Final      Susceptibility   Staphylococcus aureus - MIC*    CIPROFLOXACIN 1 SENSITIVE Sensitive     GENTAMICIN <=0.5 SENSITIVE Sensitive     NITROFURANTOIN <=16 SENSITIVE Sensitive     OXACILLIN 0.5 SENSITIVE Sensitive     TETRACYCLINE <=1 SENSITIVE Sensitive     VANCOMYCIN <=0.5 SENSITIVE Sensitive     TRIMETH/SULFA <=10 SENSITIVE Sensitive     CLINDAMYCIN <=0.25 SENSITIVE Sensitive     RIFAMPIN <=0.5 SENSITIVE Sensitive     Inducible Clindamycin NEGATIVE  Sensitive     * >=100,000 COLONIES/mL STAPHYLOCOCCUS AUREUS  Blood culture (routine x 2)     Status: Abnormal   Collection Time: 08/01/19  1:36 AM   Specimen: BLOOD  Result Value Ref Range Status   Specimen Description BLOOD RIGHT ANTECUBITAL  Final   Special Requests   Final    BOTTLES DRAWN AEROBIC AND ANAEROBIC Blood Culture adequate volume   Culture  Setup Time   Final    GRAM POSITIVE COCCI IN BOTH AEROBIC AND ANAEROBIC BOTTLES CRITICAL RESULT CALLED TO, READ BACK BY AND VERIFIED WITH: Ihor Austin PharmD 17:20 08/01/19 ([email or web address]) Performed at [facility], [address], [place], [place] [address]    Culture STAPHYLOCOCCUS AUREUS (A)  Final   Report Status 08/03/2019 FINAL  Final   Organism ID, Bacteria STAPHYLOCOCCUS AUREUS  Final      Susceptibility   Staphylococcus aureus - MIC*    CIPROFLOXACIN <=0.5 SENSITIVE Sensitive     ERYTHROMYCIN <=0.25 SENSITIVE Sensitive     GENTAMICIN <=0.5 SENSITIVE Sensitive     OXACILLIN <=0.25 SENSITIVE Sensitive     TETRACYCLINE <=1 SENSITIVE Sensitive     VANCOMYCIN 1 SENSITIVE Sensitive     TRIMETH/SULFA <=10 SENSITIVE Sensitive     CLINDAMYCIN <=0.25 SENSITIVE Sensitive     RIFAMPIN <=0.5 SENSITIVE Sensitive     Inducible Clindamycin NEGATIVE Sensitive     * STAPHYLOCOCCUS AUREUS  Blood Culture ID Panel (Reflexed)     Status: Abnormal   Collection Time: 08/01/19  1:36 AM  Result Value Ref Range Status   Enterococcus species NOT DETECTED NOT DETECTED Final   Listeria monocytogenes NOT DETECTED NOT DETECTED Final   Staphylococcus species DETECTED (A) NOT DETECTED Final    Comment: CRITICAL RESULT CALLED TO, READ BACK BY AND VERIFIED WITH: Ihor Austin PharmD 17:20 08/01/19 ([email or web address])    Staphylococcus aureus (BCID) DETECTED (A) NOT DETECTED Final    Comment: Methicillin (oxacillin) susceptible Staphylococcus aureus (MSSA). Preferred therapy is anti staphylococcal beta lactam antibiotic (Cefazolin or Nafcillin), unless clinically  contraindicated. CRITICAL RESULT CALLED TO, READ BACK BY AND VERIFIED WITH: Ihor Austin PharmD 17:20 08/01/19 ([email or web address])    Methicillin resistance NOT DETECTED NOT DETECTED Final   Streptococcus species NOT DETECTED NOT DETECTED Final   Streptococcus agalactiae NOT DETECTED NOT DETECTED Final   Streptococcus pneumoniae NOT DETECTED NOT DETECTED Final   Streptococcus pyogenes NOT DETECTED NOT DETECTED Final   Acinetobacter baumannii NOT DETECTED NOT DETECTED Final   Enterobacteriaceae species NOT DETECTED NOT DETECTED Final   Enterobacter cloacae complex NOT DETECTED NOT DETECTED Final   Escherichia coli NOT DETECTED NOT DETECTED Final   Klebsiella oxytoca NOT DETECTED NOT DETECTED Final   Klebsiella pneumoniae NOT DETECTED NOT DETECTED Final   Proteus species NOT DETECTED NOT DETECTED Final   Serratia marcescens NOT DETECTED NOT DETECTED Final   Haemophilus influenzae NOT DETECTED NOT DETECTED Final   Neisseria meningitidis NOT DETECTED NOT DETECTED Final   Pseudomonas aeruginosa NOT DETECTED NOT DETECTED Final   Candida albicans NOT DETECTED NOT DETECTED Final   Candida glabrata NOT DETECTED NOT DETECTED Final   Candida krusei NOT DETECTED NOT DETECTED Final   Candida parapsilosis NOT DETECTED NOT DETECTED Final   Candida tropicalis NOT DETECTED NOT DETECTED Final    Comment: Performed at [facility], [address] [place], [place] [address]  SARS Coronavirus 2 [facility] order, Performed in [facility] hospital lab) Nasopharyngeal Abscess     Status: None   Collection Time: 08/01/19  2:10 AM   Specimen: Abscess; Nasopharyngeal  Result Value Ref Range Status   SARS Coronavirus 2 NEGATIVE NEGATIVE Final    Comment: (NOTE) If result is NEGATIVE SARS-CoV-2 target nucleic acids are NOT DETECTED. The SARS-CoV-2 RNA is generally detectable in upper and lower  respiratory specimens during the acute phase of infection. The lowest  concentration of SARS-CoV-2 viral copies this assay  can detect is 250  copies / mL. A negative result does not preclude SARS-CoV-2 infection  and should not be used as the sole basis for treatment or other  patient management decisions.  A negative result may occur with  improper specimen collection / handling, submission of specimen other  than nasopharyngeal swab, presence of viral mutation(s) within the  areas targeted by this assay, and inadequate number of viral copies  (<250 copies / mL). A negative result must be combined with clinical  observations, patient history, and epidemiological information. If result is POSITIVE SARS-CoV-2 target nucleic acids are DETECTED. The SARS-CoV-2 RNA is generally detectable in upper and lower  respiratory specimens dur ing the acute phase of infection.  Positive  results are indicative of active infection with SARS-CoV-2.  Clinical  correlation with patient history and other diagnostic information is  necessary to determine patient infection status.  Positive results do  not rule out bacterial infection or co-infection with other viruses. If result is PRESUMPTIVE POSTIVE SARS-CoV-2 nucleic acids MAY BE PRESENT.   A presumptive positive result was obtained on the submitted specimen  and confirmed on repeat testing.  While 2019 novel coronavirus  (SARS-CoV-2) nucleic acids may be present in the submitted sample  additional confirmatory testing may be necessary for epidemiological  and / or clinical management purposes  to differentiate between  SARS-CoV-2 and other Sarbecovirus currently known to infect humans.  If clinically indicated additional testing with an alternate test  methodology [phone] is advised. The SARS-CoV-2 RNA is generally  detectable in upper and lower respiratory sp ecimens during the acute  phase of infection. The expected result is Negative. Fact Sheet for Patients:  BoilerBrush.com.cyhttps://www.fda.gov/media/136312/download Fact Sheet for Healthcare Providers:  https://pope.com/https://www.fda.gov/media/136313/download This test is not yet approved or cleared by the [place] FDA and has been authorized for detection and/or diagnosis of SARS-CoV-2 by FDA under an Emergency Use Authorization (EUA).  This EUA will remain in effect (meaning this test can be used) for the duration of the COVID-19 declaration under Section 564(b)(1) of the Act, 21 U.S.C. section 360bbb-3(b)(1), unless the authorization is terminated or revoked sooner. Performed at [facility], [address] [place], [place] [address]   Wound or Superficial Culture     Status: None   Collection Time: 08/01/19  2:10 AM   Specimen: Abscess; Wound  Result Value Ref Range Status   Specimen Description ABSCESS  Final   Special Requests NONE  Final   Gram Stain   Final    RARE WBC PRESENT, PREDOMINANTLY PMN MODERATE GRAM POSITIVE COCCI IN CLUSTERS Performed at [facility], [address] [place], [place] [address]    Culture ABUNDANT STAPHYLOCOCCUS AUREUS  Final   Report Status 08/03/2019 FINAL  Final   Organism ID, Bacteria STAPHYLOCOCCUS AUREUS  Final      Susceptibility   Staphylococcus aureus - MIC*    CIPROFLOXACIN <=0.5 SENSITIVE Sensitive     ERYTHROMYCIN <=0.25 SENSITIVE Sensitive     GENTAMICIN <=0.5 SENSITIVE Sensitive     OXACILLIN <=0.25 SENSITIVE Sensitive     TETRACYCLINE <=1 SENSITIVE Sensitive     VANCOMYCIN <=0.5 SENSITIVE Sensitive     TRIMETH/SULFA <=10 SENSITIVE Sensitive     CLINDAMYCIN <=0.25 SENSITIVE Sensitive     RIFAMPIN <=0.5 SENSITIVE Sensitive     Inducible Clindamycin NEGATIVE Sensitive     * ABUNDANT STAPHYLOCOCCUS AUREUS  Blood culture (routine x 2)     Status: Abnormal   Collection Time: 08/01/19  2:40 AM  Specimen: BLOOD LEFT ARM  Result Value Ref Range Status   Specimen Description BLOOD LEFT ARM  Final   Special Requests   Final    BOTTLES DRAWN AEROBIC AND ANAEROBIC Blood Culture adequate volume   Culture  Setup Time   Final     GRAM POSITIVE COCCI IN BOTH AEROBIC AND ANAEROBIC BOTTLES CRITICAL VALUE NOTED.  VALUE IS CONSISTENT WITH PREVIOUSLY REPORTED AND CALLED VALUE.    Culture (A)  Final    STAPHYLOCOCCUS AUREUS SUSCEPTIBILITIES PERFORMED ON PREVIOUS CULTURE WITHIN THE LAST 5 DAYS. Performed at [facility], [address] [place], [place] [address]    Report Status 08/03/2019 FINAL  Final  MRSA PCR Screening     Status: None   Collection Time: 08/01/19  7:28 AM   Specimen: Nasal Mucosa; Nasopharyngeal  Result Value Ref Range Status   MRSA by PCR NEGATIVE NEGATIVE Final    Comment:        The GeneXpert MRSA Assay (FDA approved for NASAL specimens only), is one component of a comprehensive MRSA colonization surveillance program. It is not intended to diagnose MRSA infection nor to guide or monitor treatment for MRSA infections. Performed at [facility], [address] [place], [place] [address]   Aerobic/Anaerobic Culture (surgical/deep wound)     Status: None   Collection Time: 08/01/19  1:38 PM   Specimen: Abscess  Result Value Ref Range Status   Specimen Description ABSCESS LEFT CHEST WALL  Final   Special Requests PATIENT ON FOLLOWING MAXIPINE ANCEF  Final   Gram Stain   Final    ABUNDANT WBC PRESENT, PREDOMINANTLY PMN ABUNDANT GRAM POSITIVE COCCI    Culture   Final    ABUNDANT STAPHYLOCOCCUS AUREUS NO ANAEROBES ISOLATED Performed at [facility], [address] [place], [place] [address]    Report Status 08/06/2019 FINAL  Final   Organism ID, Bacteria STAPHYLOCOCCUS AUREUS  Final      Susceptibility   Staphylococcus aureus - MIC*    CIPROFLOXACIN <=0.5 SENSITIVE Sensitive     ERYTHROMYCIN <=0.25 SENSITIVE Sensitive     GENTAMICIN <=0.5 SENSITIVE Sensitive     OXACILLIN <=0.25 SENSITIVE Sensitive     TETRACYCLINE <=1 SENSITIVE Sensitive     VANCOMYCIN 1 SENSITIVE Sensitive     TRIMETH/SULFA <=10 SENSITIVE Sensitive     CLINDAMYCIN <=0.25 SENSITIVE Sensitive      RIFAMPIN <=0.5 SENSITIVE Sensitive     Inducible Clindamycin NEGATIVE Sensitive     * ABUNDANT STAPHYLOCOCCUS AUREUS  Culture, blood (routine x 2)     Status: None (Preliminary result)   Collection Time: 08/02/19 10:26 AM   Specimen: BLOOD RIGHT ARM  Result Value Ref Range Status   Specimen Description BLOOD RIGHT ARM  Final   Special Requests   Final    BOTTLES DRAWN AEROBIC AND ANAEROBIC Blood Culture adequate volume   Culture   Final    NO GROWTH 4 DAYS Performed at [facility], [address] [place], [place] [address]    Report Status PENDING  Incomplete  Culture, blood (routine x 2)     Status: None (Preliminary result)   Collection Time: 08/02/19 10:26 AM   Specimen: BLOOD LEFT ARM  Result Value Ref Range Status   Specimen Description BLOOD LEFT ARM  Final   Special Requests   Final    BOTTLES DRAWN AEROBIC ONLY Blood Culture adequate volume   Culture   Final    NO GROWTH 4  DAYS Performed at [facility], [address] [place], [place] [address]    Report Status PENDING  Incomplete  Aerobic Culture (superficial specimen)     Status: None   Collection Time: 08/03/19 11:24 AM   Specimen: Heel; Wound  Result Value Ref Range Status   Specimen Description HEEL  Final   Special Requests Normal  Final   Gram Stain   Final    ABUNDANT WBC PRESENT, PREDOMINANTLY PMN ABUNDANT GRAM POSITIVE COCCI Performed at [facility], [address], [place], [place] [address]    Culture ABUNDANT STAPHYLOCOCCUS AUREUS  Final   Report Status 08/05/2019 FINAL  Final   Organism ID, Bacteria STAPHYLOCOCCUS AUREUS  Final      Susceptibility   Staphylococcus aureus - MIC*    CIPROFLOXACIN <=0.5 SENSITIVE Sensitive     ERYTHROMYCIN <=0.25 SENSITIVE Sensitive     GENTAMICIN <=0.5 SENSITIVE Sensitive     OXACILLIN <=0.25 SENSITIVE Sensitive     TETRACYCLINE <=1 SENSITIVE Sensitive     VANCOMYCIN 1 SENSITIVE Sensitive     TRIMETH/SULFA <=10 SENSITIVE Sensitive      CLINDAMYCIN <=0.25 SENSITIVE Sensitive     RIFAMPIN <=0.5 SENSITIVE Sensitive     Inducible Clindamycin NEGATIVE Sensitive     * ABUNDANT STAPHYLOCOCCUS AUREUS  Culture, blood (routine x 2)     Status: None (Preliminary result)   Collection Time: 08/05/19 11:03 AM   Specimen: BLOOD RIGHT HAND  Result Value Ref Range Status   Specimen Description BLOOD RIGHT HAND  Final   Special Requests   Final    BOTTLES DRAWN AEROBIC ONLY Blood Culture results may not be optimal due to an inadequate volume of blood received in culture bottles   Culture   Final    NO GROWTH < 24 HOURS Performed at [facility], [address], [place], [place] [address]    Report Status PENDING  Incomplete  Culture, blood (routine x 2)     Status: None (Preliminary result)   Collection Time: 08/05/19 11:04 AM   Specimen: BLOOD LEFT HAND  Result Value Ref Range Status   Specimen Description BLOOD LEFT HAND  Final   Special Requests   Final    BOTTLES DRAWN AEROBIC AND ANAEROBIC Blood Culture adequate volume   Culture   Final    NO GROWTH < 24 HOURS Performed at [facility], [address], [place], [place] [address]    Report Status PENDING  Incomplete    Rexene Alberts, MSN, NP-C [facility]  [email or web address] Pager: [phone] Office: [phone] [facility] Main Line: [phone]

## 2019-08-07 ENCOUNTER — Encounter (HOSPITAL_COMMUNITY): Payer: Self-pay | Admitting: General Practice

## 2019-08-07 DIAGNOSIS — F1120 Opioid dependence, uncomplicated: Secondary | ICD-10-CM

## 2019-08-07 DIAGNOSIS — L02611 Cutaneous abscess of right foot: Secondary | ICD-10-CM

## 2019-08-07 DIAGNOSIS — L02414 Cutaneous abscess of left upper limb: Secondary | ICD-10-CM

## 2019-08-07 DIAGNOSIS — M4626 Osteomyelitis of vertebra, lumbar region: Secondary | ICD-10-CM

## 2019-08-07 LAB — CULTURE, BLOOD (ROUTINE X 2)
Culture: NO GROWTH
Culture: NO GROWTH
Special Requests: ADEQUATE
Special Requests: ADEQUATE

## 2019-08-07 LAB — GLUCOSE, CAPILLARY
Glucose-Capillary: 176 mg/dL — ABNORMAL HIGH (ref 70–99)
Glucose-Capillary: 236 mg/dL — ABNORMAL HIGH (ref 70–99)
Glucose-Capillary: 158 mg/dL — ABNORMAL HIGH (ref 70–99)
Glucose-Capillary: 196 mg/dL — ABNORMAL HIGH (ref 70–99)
Glucose-Capillary: 123 mg/dL — ABNORMAL HIGH (ref 70–99)

## 2019-08-07 LAB — SURGICAL PCR SCREEN
MRSA, PCR: NEGATIVE
Staphylococcus aureus: POSITIVE — AB

## 2019-08-07 MED ORDER — MUPIROCIN 2 % EX OINT
1.0000 "application " | TOPICAL_OINTMENT | Freq: Two times a day (BID) | CUTANEOUS | Status: AC
  Administered 2019-08-07 – 2019-08-11 (×9): 1 via NASAL
  Filled 2019-08-07 (×2): qty 22

## 2019-08-07 MED ORDER — CALCIUM CARBONATE ANTACID 500 MG PO CHEW
400.0000 mg | CHEWABLE_TABLET | Freq: Three times a day (TID) | ORAL | Status: DC | PRN
  Administered 2019-08-07 – 2019-08-21 (×4): 400 mg via ORAL
  Filled 2019-08-07 (×4): qty 2

## 2019-08-07 MED ORDER — OXYCODONE HCL 5 MG PO TABS
5.0000 mg | ORAL_TABLET | ORAL | Status: DC | PRN
  Administered 2019-08-07 – 2019-09-02 (×99): 5 mg via ORAL
  Filled 2019-08-07 (×104): qty 1

## 2019-08-07 MED ORDER — ALPRAZOLAM 0.5 MG PO TABS
0.5000 mg | ORAL_TABLET | Freq: Once | ORAL | Status: AC
  Administered 2019-08-08: 0.5 mg via ORAL
  Filled 2019-08-07: qty 1

## 2019-08-07 NOTE — Progress Notes (Addendum)
      [address]       [place] [address]             [phone]      6 Days Post-Op Procedure(s) (LRB): I&D Left Chest wall abscess with application of wound vac (Left) TRANSESOPHAGEAL ECHOCARDIOGRAM (TEE) (N/A)\  Subjective:  Patient states his back is really hurting him.  He states his knees aren't as bad today.  Patient states he has been walking and up in chair.  Objective: Vital signs in last 24 hours: Temp:  [98.1 F (36.7 C)-100.8 F (38.2 C)] 99 F (37.2 C) (09/08 0408) Pulse Rate:  [78-100] 92 (09/08 0408) Cardiac Rhythm: Other (Comment) (09/07 1242) Resp:  [16-18] 18 (09/08 0408) BP: (95-150)/(62-82) 140/81 (09/08 0408) SpO2:  [99 %-100 %] 99 % (09/08 0408)  Intake/Output from previous day: 09/07 0701 - 09/08 0700 In: 1660 [P.O.:1560; IV Piggyback:100] Out: 700 [Urine:650; Drains:50]  General appearance: alert, cooperative, mild distress and patient appears uncomfortable Heart: regular rate and rhythm Lungs: clear to auscultation bilaterally Wound: wound vac in place, no surrounding erythema, edema noted  Lab Results: Recent Labs    08/06/19 0738  WBC 11.6*  HGB 8.4*  HCT 24.2*  PLT 355   BMET:  Recent Labs    08/06/19 0738  NA 133*  K 3.3*  CL 93*  CO2 28  GLUCOSE 196*  BUN 21*  CREATININE 1.05  CALCIUM 7.4*    PT/INR: No results for input(s): LABPROT, INR in the last 72 hours. ABG    Component Value Date/Time   PHART 7.482 (H) 08/01/2019 0909   HCO3 31.0 (H) 08/01/2019 0909   TCO2 32 08/01/2019 0909   O2SAT 97.0 08/01/2019 0909   CBG (last 3)  Recent Labs    08/06/19 1630 08/06/19 2114 08/07/19 0617  GLUCAP 178* 156* 236*    Assessment/Plan: S/P Procedure(s) (LRB): I&D Left Chest wall abscess with application of wound vac (Left) TRANSESOPHAGEAL ECHOCARDIOGRAM (TEE) (N/A)  1. S/P Incision and Drainage of [place] Joint- wound vac changed yesterday, patient noted to have a lot of purulence in wound bed... he  continues to have low grade fever despite IV ABX... patient may need to return to OR for further wound debridement 2. ID- on ABX per ID recommendations, he will require long course due to multiple abscess collections, Midline catheter has been placed 3. Dispo- patient visibly uncomfortable due to back pain this morning, states knee pain isn't as bad, purulence noted in [place] joint wound bed yesterday by wound care nurse, wound vac cannister is filled with purulent material.  He may need further I/D in operating room however this will be defered to staff, continue care per primary   LOS: 6 days    Ellwood Handler 08/07/2019   Chart reviewed, patient examined, agree with above. Drainage from the wound VAC is purulent in the cannister. Does not appear to be much in the tubing. No signs of infection around the site. Low grade fever 100.5 last evening but Tmax 99 so far today. Plan wound vac change tomorrow with assessment of the wound.
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
## 2019-08-07 NOTE — Progress Notes (Signed)
PROGRESS NOTE    William [name]  HUT:[id number] DOB: [birth year] DOA: 07/31/2019 PCP: Patient, No Pcp Per    Brief Narrative:  38 year old no previous medical history, heroin injection for last 10 years presented to emergency room on 9/1 with multiple complaints including lower back pain, left shoulder pain, right foot and heel pain.  In the emergency room, he has right heel abscess, left upper chest wall abscess and mass, CT scan noted left anterior chest wall soft tissue and intramuscular infection with extension into the thoracic cavity, air in the soft tissues of the right axilla adjacent to the right shoulder, multiple small pulmonary nodules.  Patient was admitted to ICU.  Underwent surgical debridement of the chest wall abscess and wound VAC placement.  Also found to have new diagnosis of diabetes.  MRI showed multiple epidural and paraspinal abscesses. Blood cultures 9/2: MSSA Blood cultures 9/3: Pending, no growth so far. Wound culture 9/2: MSSA. Right heel culture 9/3: Abundant WBC.  MSSA. Repeat c/s : 9/3,9/6 negative so far.  Continues to have intermittent low-grade temperature, myalgia and large amount of purulent drainage from wound VAC.  Assessment & Plan:   Principal Problem:   Staphylococcus aureus bacteremia Active Problems:   Sepsis (HCC)   Abscess of paraspinous muscles   Osteomyelitis of lumbar spine (HCC)   Epidural abscess of spine due to infective embolism   Abnormal chest CT   Septic embolism (HCC)   Community acquired pneumonia   IV drug abuse (HCC)   Heroin abuse (HCC)   Elevated troponin   UTI (urinary tract infection)   Renal failure   Hyperglycemia   Suspected endocarditis   Chest wall abscess   Abscess of right heel   Acute osteomyelitis of lumbar spine (HCC)  Sepsis present on admission with MSSA bacteremia due to IV drug use: Septic pulmonary emboli, Left chest wall abscess, Right heel abscess, Extensive right posterolateral epidural abscess  L2-L3, Osteomyelitis of the L5-S1, Posterior epidural abscess L5-S1 S2. - Status post incision and drainage and debridement left chest wall on wound VAC, followed by CT CS. - Bedside I&D right heel, MSSA. - Multiple epidural abscess and discitis, seen by neurosurgery recommended no surgical drainage but antibiotics. - followed by infectious disease.  He was on cefazolin and metronidazole.  Now remains on nafcillin.  Repeat cultures were negative. -Temperature fluctuates.  Low-grade temperature present.  Knee pain is better, he mostly has thigh pain with no evidence of collection or induration. - Intraoperative TEE with no evidence of endocarditis. - Anticipate intravenous antibiotics for about 6 to 8 weeks.  Patient has exhausted IV access.  PICC line given. -Back to the OR for more debridement?  Followed by surgery.  IV drug use: Ongoing opiate use.  Patient is motivated to quit.  Will provide symptomatic treatment.  Opiates for pain relief for postoperative pain.  Will use Tylenol and muscle relaxants. Used metoprolol for tachycardia. Patient to use Xanax for craving and anxiety.  New onset diabetes: A1c 7.8.  Started on insulin.  Stabilizing on increased dose of insulin. Keep on sliding scale insulin.  Stay on current regimen today.  MiraLAX and Colace for constipation. Advised ambulation.   DVT prophylaxis: Heparin subcu Code Status: Full code Family Communication: None Disposition Plan: Inpatient   Consultants:   PCCM, transfer out  CT CS, following  Neurosurgery, signed off  Orthopedics, signed off  Procedures:   Chest wall abscess incision and drainage, 08/02/2019  Right heel incision and drainage, 08/03/2019  Antimicrobials:   Vancomycin, 08/01/2019>>> 08/02/2019  Cefepime, 08/01/2019>>> 08/02/2019  Cefazolin, 08/02/2019>>>>> 08/06/2019  Flagyl, 08/02/2019>>> 08/06/2019  Diflucan, on 08/03/2019 dose  Nystatin swish and swallow, 08/03/2019 >>>  Nafcillin 08/06/2019>>>>    Subjective: Patient seen and examined.  Overnight temperature maximum 100.  Today his thigh muscles are hurting.  Knee is not hurting. Wants something for the pain.  Objective: Vitals:   08/06/19 1819 08/06/19 2057 08/07/19 0014 08/07/19 0408  BP: 95/62 (!) 143/82 (!) 150/80 140/81  Pulse: 78 100 93 92  Resp:   16 18  Temp: (!) 100.5 F (38.1 C) 98.1 F (36.7 C) 98.2 F (36.8 C) 99 F (37.2 C)  TempSrc: Oral Oral Oral Oral  SpO2:  100% 100% 99%  Weight:      Height:        Intake/Output Summary (Last 24 hours) at 08/07/2019 1025 Last data filed at 08/06/2019 2300 Gross per 24 hour  Intake 940 ml  Output 700 ml  Net 240 ml   Filed Weights   08/03/19 0500 08/04/19 0500 08/05/19 0500  Weight: 89.3 kg 84 kg 84.3 kg    Examination:  General exam: Comfortable.  Anxious.  Without any distress.  Afebrile.   Respiratory system: Clear to auscultation. Respiratory effort normal. Cardiovascular system: S1 & S2 heard, RRR. No JVD, murmurs, rubs, gallops or clicks. No pedal edema. Left anterior chest wall wound with wound VAC, draining pus. Gastrointestinal system: Abdomen is nondistended, soft and nontender. No organomegaly or masses felt. Normal bowel sounds heard. Central nervous system: Alert and oriented. No focal neurological deficits. Extremities: Symmetric 5 x 5 power.  No sensory deficits. Skin: No rashes, lesions.  Right heel dressing intact.  Dry and clean. Bilateral knee and other joints with no effusion, no redness or erythema. Psychiatry: Judgement and insight appear normal. Mood & affect anxious.    Data Reviewed: I have personally reviewed following labs and imaging studies  CBC: Recent Labs  Lab 07/31/19 2302 08/01/19 0909 08/01/19 0929 08/02/19 0309 08/04/19 0352 08/06/19 0738  WBC 19.3*  --  13.7* 13.4* 12.1* 11.6*  NEUTROABS 18.5*  --   --   --  9.9* 9.8*  HGB 11.1* 9.5* 8.9* 9.1* 9.7* 8.4*  HCT 32.3* 28.0* 25.8* 24.8* 28.4* 24.2*  MCV 88.7  --  87.2  84.6 88.5 88.3  PLT 347  --  291 336 450* 355   Basic Metabolic Panel: Recent Labs  Lab 07/31/19 2302 08/01/19 0909 08/01/19 0929 08/04/19 0352 08/06/19 0738  NA 120* 133* 134* 134* 133*  K 6.0* 4.1 4.5 3.0* 3.3*  CL 79*  --  97* 95* 93*  CO2 27  --  26 27 28   GLUCOSE 701*  --  179* 190* 196*  BUN 53*  --  39* 28* 21*  CREATININE 1.62*  --  1.25* 1.24 1.05  CALCIUM 8.4*  --  8.0* 8.0* 7.4*  MG  --   --  2.1 1.9 1.7  PHOS  --   --  3.5 3.9 2.7   GFR: Estimated Creatinine Clearance: 110.9 mL/min (by C-G formula based on SCr of 1.05 mg/dL). Liver Function Tests: Recent Labs  Lab 07/31/19 2302 08/01/19 0929 08/06/19 0738  AST 19 20 20   ALT 25 18 15   ALKPHOS 276* 178* 96  BILITOT 2.7* 2.6* 0.9  PROT 6.9 5.3* 5.5*  ALBUMIN 1.7* 1.4* 1.3*   No results for input(s): LIPASE, AMYLASE in the last 168 hours. No results for input(s): AMMONIA in the last  168 hours. Coagulation Profile: Recent Labs  Lab 07/31/19 2302 08/01/19 0929  INR 1.3* 1.3*   Cardiac Enzymes: Recent Labs  Lab 07/31/19 2302  CKTOTAL 71   BNP (last 3 results) No results for input(s): PROBNP in the last 8760 hours. HbA1C: No results for input(s): HGBA1C in the last 72 hours. CBG: Recent Labs  Lab 08/06/19 0607 08/06/19 1134 08/06/19 1630 08/06/19 2114 08/07/19 0617  GLUCAP 259* 184* 178* 156* 236*   Lipid Profile: No results for input(s): CHOL, HDL, LDLCALC, TRIG, CHOLHDL, LDLDIRECT in the last 72 hours. Thyroid Function Tests: No results for input(s): TSH, T4TOTAL, FREET4, T3FREE, THYROIDAB in the last 72 hours. Anemia Panel: No results for input(s): VITAMINB12, FOLATE, FERRITIN, TIBC, IRON, RETICCTPCT in the last 72 hours. Sepsis Labs: Recent Labs  Lab 07/31/19 2302 08/01/19 0058  LATICACIDVEN 3.7* 3.5*    Recent Results (from the past 240 hour(s))  Urine culture     Status: Abnormal   Collection Time: 07/31/19 10:58 PM   Specimen: In/Out Cath Urine  Result Value Ref Range  Status   Specimen Description IN/OUT CATH URINE  Final   Special Requests   Final    NONE Performed at [facility], [address] [place], [place] [address]    Culture >=100,000 COLONIES/mL STAPHYLOCOCCUS AUREUS (A)  Final   Report Status 08/02/2019 FINAL  Final   Organism ID, Bacteria STAPHYLOCOCCUS AUREUS (A)  Final      Susceptibility   Staphylococcus aureus - MIC*    CIPROFLOXACIN 1 SENSITIVE Sensitive     GENTAMICIN <=0.5 SENSITIVE Sensitive     NITROFURANTOIN <=16 SENSITIVE Sensitive     OXACILLIN 0.5 SENSITIVE Sensitive     TETRACYCLINE <=1 SENSITIVE Sensitive     VANCOMYCIN <=0.5 SENSITIVE Sensitive     TRIMETH/SULFA <=10 SENSITIVE Sensitive     CLINDAMYCIN <=0.25 SENSITIVE Sensitive     RIFAMPIN <=0.5 SENSITIVE Sensitive     Inducible Clindamycin NEGATIVE Sensitive     * >=100,000 COLONIES/mL STAPHYLOCOCCUS AUREUS  Blood culture (routine x 2)     Status: Abnormal   Collection Time: 08/01/19  1:36 AM   Specimen: BLOOD  Result Value Ref Range Status   Specimen Description BLOOD RIGHT ANTECUBITAL  Final   Special Requests   Final    BOTTLES DRAWN AEROBIC AND ANAEROBIC Blood Culture adequate volume   Culture  Setup Time   Final    GRAM POSITIVE COCCI IN BOTH AEROBIC AND ANAEROBIC BOTTLES CRITICAL RESULT CALLED TO, READ BACK BY AND VERIFIED WITH: Ihor Austin PharmD 17:20 08/01/19 ([email or web address]) Performed at [facility], [address], [place], [place] [address]    Culture STAPHYLOCOCCUS AUREUS (A)  Final   Report Status 08/03/2019 FINAL  Final   Organism ID, Bacteria STAPHYLOCOCCUS AUREUS  Final      Susceptibility   Staphylococcus aureus - MIC*    CIPROFLOXACIN <=0.5 SENSITIVE Sensitive     ERYTHROMYCIN <=0.25 SENSITIVE Sensitive     GENTAMICIN <=0.5 SENSITIVE Sensitive     OXACILLIN <=0.25 SENSITIVE Sensitive     TETRACYCLINE <=1 SENSITIVE Sensitive     VANCOMYCIN 1 SENSITIVE Sensitive     TRIMETH/SULFA <=10 SENSITIVE Sensitive     CLINDAMYCIN <=0.25  SENSITIVE Sensitive     RIFAMPIN <=0.5 SENSITIVE Sensitive     Inducible Clindamycin NEGATIVE Sensitive     * STAPHYLOCOCCUS AUREUS  Blood Culture ID Panel (Reflexed)     Status: Abnormal   Collection Time: 08/01/19  1:36 AM  Result Value Ref Range Status   Enterococcus species NOT DETECTED NOT DETECTED Final   Listeria monocytogenes NOT DETECTED NOT DETECTED Final   Staphylococcus species DETECTED (A) NOT DETECTED Final    Comment: CRITICAL RESULT CALLED TO, READ BACK BY AND VERIFIED WITH: Ihor Austin PharmD 17:20 08/01/19 ([email or web address])    Staphylococcus aureus (BCID) DETECTED (A) NOT DETECTED Final    Comment: Methicillin (oxacillin) susceptible Staphylococcus aureus (MSSA). Preferred therapy is anti staphylococcal beta lactam antibiotic (Cefazolin or Nafcillin), unless clinically contraindicated. CRITICAL RESULT CALLED TO, READ BACK BY AND VERIFIED WITH: Ihor Austin PharmD 17:20 08/01/19 ([email or web address])    Methicillin resistance NOT DETECTED NOT DETECTED Final   Streptococcus species NOT DETECTED NOT DETECTED Final   Streptococcus agalactiae NOT DETECTED NOT DETECTED Final   Streptococcus pneumoniae NOT DETECTED NOT DETECTED Final   Streptococcus pyogenes NOT DETECTED NOT DETECTED Final   Acinetobacter baumannii NOT DETECTED NOT DETECTED Final   Enterobacteriaceae species NOT DETECTED NOT DETECTED Final   Enterobacter cloacae complex NOT DETECTED NOT DETECTED Final   Escherichia coli NOT DETECTED NOT DETECTED Final   Klebsiella oxytoca NOT DETECTED NOT DETECTED Final   Klebsiella pneumoniae NOT DETECTED NOT DETECTED Final   Proteus species NOT DETECTED NOT DETECTED Final   Serratia marcescens NOT DETECTED NOT DETECTED Final   Haemophilus influenzae NOT DETECTED NOT DETECTED Final   Neisseria meningitidis NOT DETECTED NOT DETECTED Final   Pseudomonas aeruginosa NOT DETECTED NOT DETECTED Final   Candida albicans NOT DETECTED NOT DETECTED Final   Candida glabrata NOT DETECTED NOT DETECTED Final    Candida krusei NOT DETECTED NOT DETECTED Final   Candida parapsilosis NOT DETECTED NOT DETECTED Final   Candida tropicalis NOT DETECTED NOT DETECTED Final    Comment: Performed at [facility], [address], [place], [place] [address]  SARS Coronavirus 2 [facility] order, Performed in [facility] hospital lab) Nasopharyngeal Abscess     Status: None   Collection Time: 08/01/19  2:10 AM   Specimen: Abscess; Nasopharyngeal  Result Value Ref Range Status   SARS Coronavirus 2 NEGATIVE NEGATIVE Final    Comment: (NOTE) If result is NEGATIVE SARS-CoV-2 target nucleic acids are NOT DETECTED. The SARS-CoV-2 RNA is generally detectable in upper and lower  respiratory specimens during the acute phase of infection. The lowest  concentration of SARS-CoV-2 viral copies this assay can detect is 250  copies / mL. A negative result does not preclude SARS-CoV-2 infection  and should not be used as the sole basis for treatment or other  patient management decisions.  A negative result may occur with  improper specimen collection / handling, submission of specimen other  than nasopharyngeal swab, presence of viral mutation(s) within the  areas targeted by this assay, and inadequate number of viral copies  (<250 copies / mL). A negative result must be combined with clinical  observations, patient history, and epidemiological information. If result is POSITIVE SARS-CoV-2 target nucleic acids are DETECTED. The SARS-CoV-2 RNA is generally detectable in upper and lower  respiratory specimens dur ing the acute phase of infection.  Positive  results are indicative of active infection with SARS-CoV-2.  Clinical  correlation with patient history and other diagnostic information is  necessary to determine patient infection status.  Positive results do  not rule out bacterial infection or co-infection with other viruses. If result is PRESUMPTIVE POSTIVE SARS-CoV-2 nucleic acids MAY BE PRESENT.   A  presumptive positive result was obtained on the submitted specimen  and confirmed on  repeat testing.  While 2019 novel coronavirus  (SARS-CoV-2) nucleic acids may be present in the submitted sample  additional confirmatory testing may be necessary for epidemiological  and / or clinical management purposes  to differentiate between  SARS-CoV-2 and other Sarbecovirus currently known to infect humans.  If clinically indicated additional testing with an alternate test  methodology [phone]) is advised. The SARS-CoV-2 RNA is generally  detectable in upper and lower respiratory sp ecimens during the acute  phase of infection. The expected result is Negative. Fact Sheet for Patients:  BoilerBrush.com.cy Fact Sheet for Healthcare Providers: https://pope.com/ This test is not yet approved or cleared by the [place] FDA and has been authorized for detection and/or diagnosis of SARS-CoV-2 by FDA under an Emergency Use Authorization (EUA).  This EUA will remain in effect (meaning this test can be used) for the duration of the COVID-19 declaration under Section 564(b)(1) of the Act, 21 U.S.C. section 360bbb-3(b)(1), unless the authorization is terminated or revoked sooner. Performed at [facility], [address] [place], [place] [address]   Wound or Superficial Culture     Status: None   Collection Time: 08/01/19  2:10 AM   Specimen: Abscess; Wound  Result Value Ref Range Status   Specimen Description ABSCESS  Final   Special Requests NONE  Final   Gram Stain   Final    RARE WBC PRESENT, PREDOMINANTLY PMN MODERATE GRAM POSITIVE COCCI IN CLUSTERS Performed at [facility], [address], [place], [place] [address]    Culture ABUNDANT STAPHYLOCOCCUS AUREUS  Final   Report Status 08/03/2019 FINAL  Final   Organism ID, Bacteria STAPHYLOCOCCUS AUREUS  Final      Susceptibility   Staphylococcus aureus - MIC*    CIPROFLOXACIN  <=0.5 SENSITIVE Sensitive     ERYTHROMYCIN <=0.25 SENSITIVE Sensitive     GENTAMICIN <=0.5 SENSITIVE Sensitive     OXACILLIN <=0.25 SENSITIVE Sensitive     TETRACYCLINE <=1 SENSITIVE Sensitive     VANCOMYCIN <=0.5 SENSITIVE Sensitive     TRIMETH/SULFA <=10 SENSITIVE Sensitive     CLINDAMYCIN <=0.25 SENSITIVE Sensitive     RIFAMPIN <=0.5 SENSITIVE Sensitive     Inducible Clindamycin NEGATIVE Sensitive     * ABUNDANT STAPHYLOCOCCUS AUREUS  Blood culture (routine x 2)     Status: Abnormal   Collection Time: 08/01/19  2:40 AM   Specimen: BLOOD LEFT ARM  Result Value Ref Range Status   Specimen Description BLOOD LEFT ARM  Final   Special Requests   Final    BOTTLES DRAWN AEROBIC AND ANAEROBIC Blood Culture adequate volume   Culture  Setup Time   Final    GRAM POSITIVE COCCI IN BOTH AEROBIC AND ANAEROBIC BOTTLES CRITICAL VALUE NOTED.  VALUE IS CONSISTENT WITH PREVIOUSLY REPORTED AND CALLED VALUE.    Culture (A)  Final    STAPHYLOCOCCUS AUREUS SUSCEPTIBILITIES PERFORMED ON PREVIOUS CULTURE WITHIN THE LAST 5 DAYS. Performed at [facility], [address] [place], [place] [address]    Report Status 08/03/2019 FINAL  Final  MRSA PCR Screening     Status: None   Collection Time: 08/01/19  7:28 AM   Specimen: Nasal Mucosa; Nasopharyngeal  Result Value Ref Range Status   MRSA by PCR NEGATIVE NEGATIVE Final    Comment:        The GeneXpert MRSA Assay (FDA approved for NASAL specimens only), is one component of a comprehensive MRSA colonization surveillance program. It is not intended to  diagnose MRSA infection nor to guide or monitor treatment for MRSA infections. Performed at [facility], [place] [address] [place], [place] [address]   Aerobic/Anaerobic Culture (surgical/deep wound)     Status: None   Collection Time: 08/01/19  1:38 PM   Specimen: Abscess  Result Value Ref Range Status   Specimen Description ABSCESS LEFT CHEST WALL  Final   Special Requests PATIENT  ON FOLLOWING MAXIPINE ANCEF  Final   Gram Stain   Final    ABUNDANT WBC PRESENT, PREDOMINANTLY PMN ABUNDANT GRAM POSITIVE COCCI    Culture   Final    ABUNDANT STAPHYLOCOCCUS AUREUS NO ANAEROBES ISOLATED Performed at [facility], [place] [address], [place], [place] [address]    Report Status 08/06/2019 FINAL  Final   Organism ID, Bacteria STAPHYLOCOCCUS AUREUS  Final      Susceptibility   Staphylococcus aureus - MIC*    CIPROFLOXACIN <=0.5 SENSITIVE Sensitive     ERYTHROMYCIN <=0.25 SENSITIVE Sensitive     GENTAMICIN <=0.5 SENSITIVE Sensitive     OXACILLIN <=0.25 SENSITIVE Sensitive     TETRACYCLINE <=1 SENSITIVE Sensitive     VANCOMYCIN 1 SENSITIVE Sensitive     TRIMETH/SULFA <=10 SENSITIVE Sensitive     CLINDAMYCIN <=0.25 SENSITIVE Sensitive     RIFAMPIN <=0.5 SENSITIVE Sensitive     Inducible Clindamycin NEGATIVE Sensitive     * ABUNDANT STAPHYLOCOCCUS AUREUS  Culture, blood (routine x 2)     Status: None   Collection Time: 08/02/19 10:26 AM   Specimen: BLOOD RIGHT ARM  Result Value Ref Range Status   Specimen Description BLOOD RIGHT ARM  Final   Special Requests   Final    BOTTLES DRAWN AEROBIC AND ANAEROBIC Blood Culture adequate volume   Culture   Final    NO GROWTH 5 DAYS Performed at [facility], [place] [address] [place], [place] [address]    Report Status 08/07/2019 FINAL  Final  Culture, blood (routine x 2)     Status: None   Collection Time: 08/02/19 10:26 AM   Specimen: BLOOD LEFT ARM  Result Value Ref Range Status   Specimen Description BLOOD LEFT ARM  Final   Special Requests   Final    BOTTLES DRAWN AEROBIC ONLY Blood Culture adequate volume   Culture   Final    NO GROWTH 5 DAYS Performed at [facility], [place] [address], [place], [place] [address]    Report Status 08/07/2019 FINAL  Final  Aerobic Culture (superficial specimen)     Status: None   Collection Time: 08/03/19 11:24 AM   Specimen: Heel; Wound  Result Value Ref Range Status    Specimen Description HEEL  Final   Special Requests Normal  Final   Gram Stain   Final    ABUNDANT WBC PRESENT, PREDOMINANTLY PMN ABUNDANT GRAM POSITIVE COCCI Performed at [facility], [place] [address], [place], [place] [address]    Culture ABUNDANT STAPHYLOCOCCUS AUREUS  Final   Report Status 08/05/2019 FINAL  Final   Organism ID, Bacteria STAPHYLOCOCCUS AUREUS  Final      Susceptibility   Staphylococcus aureus - MIC*    CIPROFLOXACIN <=0.5 SENSITIVE Sensitive     ERYTHROMYCIN <=0.25 SENSITIVE Sensitive     GENTAMICIN <=0.5 SENSITIVE Sensitive     OXACILLIN <=0.25 SENSITIVE Sensitive     TETRACYCLINE <=1 SENSITIVE Sensitive     VANCOMYCIN 1 SENSITIVE Sensitive     TRIMETH/SULFA <=10 SENSITIVE Sensitive  CLINDAMYCIN <=0.25 SENSITIVE Sensitive     RIFAMPIN <=0.5 SENSITIVE Sensitive     Inducible Clindamycin NEGATIVE Sensitive     * ABUNDANT STAPHYLOCOCCUS AUREUS  Culture, blood (routine x 2)     Status: None (Preliminary result)   Collection Time: 08/05/19 11:03 AM   Specimen: BLOOD RIGHT HAND  Result Value Ref Range Status   Specimen Description BLOOD RIGHT HAND  Final   Special Requests   Final    BOTTLES DRAWN AEROBIC ONLY Blood Culture results may not be optimal due to an inadequate volume of blood received in culture bottles   Culture   Final    NO GROWTH 2 DAYS Performed at [facility], [address] [place], [place] [address]    Report Status PENDING  Incomplete  Culture, blood (routine x 2)     Status: None (Preliminary result)   Collection Time: 08/05/19 11:04 AM   Specimen: BLOOD LEFT HAND  Result Value Ref Range Status   Specimen Description BLOOD LEFT HAND  Final   Special Requests   Final    BOTTLES DRAWN AEROBIC AND ANAEROBIC Blood Culture adequate volume   Culture   Final    NO GROWTH 2 DAYS Performed at [facility], [address] [place], [place] [address]    Report Status PENDING  Incomplete         Radiology Studies:  No results found.      Scheduled Meds: . Chlorhexidine Gluconate Cloth  6 each Topical Daily  . docusate sodium  100 mg Oral Daily  . feeding supplement (GLUCERNA SHAKE)  237 mL Oral TID BM  . heparin  5,000 Units Subcutaneous Q8H  . insulin aspart  0-15 Units Subcutaneous TID WC  . insulin aspart  0-5 Units Subcutaneous QHS  . insulin glargine  18 Units Subcutaneous Daily  . metoprolol tartrate  12.5 mg Oral BID  . nystatin  5 mL Oral QID  . ondansetron  4 mg Intravenous Once  . polyethylene glycol  17 g Oral Daily  . potassium chloride  40 mEq Oral BID  . ramelteon  8 mg Oral QHS  . senna-docusate  1 tablet Oral BID  . sodium chloride flush  10-40 mL Intracatheter Q12H   Continuous Infusions: . nafcillin IV 2 g (08/07/19 0823)     LOS: 6 days    Time spent: 25 minutes    Dorcas CarrowKuber Martita Brumm, MD [facility] Pager [phone]  If 7PM-7AM, please contact night-coverage [email or web address] Password [email or web address] 08/07/2019, 10:25 AM

## 2019-08-07 NOTE — Progress Notes (Addendum)
Inpatient Diabetes Program Recommendations  AACE/ADA: New Consensus Statement on Inpatient Glycemic Control   Target Ranges:  Prepandial:   less than 140 mg/dL      Peak postprandial:   less than 180 mg/dL (1-2 hours)      Critically ill patients:  140 - 180 mg/dL   Results for William [name], William [name] (MRN [id number]) as of 08/07/2019 11:34  Ref. Range 08/06/2019 06:07 08/06/2019 11:34 08/06/2019 16:30 08/06/2019 21:14 08/07/2019 06:17  Glucose-Capillary Latest Ref Range: 70 - 99 mg/dL 259 (H) 184 (H) 178 (H) 156 (H) 236 (H)   Review of Glycemic Control  Diabetes history: New DM dx this admission Outpatient Diabetes medications: NA Current orders for Inpatient glycemic control: Lantus 18 units daily, Novolog 0-15 units TID with meals, Novolog 0-5 units QHS  Inpatient Diabetes Program Recommendations:   Insulin-Basal: Please consider increasing Lantus to 20 units daily.  Insulin-Meal Coverage: Please consider ordering Novolog 3 units TID with meals for meal coverage if patient eats at least 50% of meals.  NOTE: Went by to follow up with patient. Patient sitting up in chair and notes that he does not feel well today.  Discussed current glycemic control and insulin. Patient states that he has not given himself any insulin injections yet but he feels that he will be fine with self-injecting insulin. Patient notes that he had a friend that has Type 1 DM so he is familiar with how insulin is injected.  Patient states he prefers to use an insulin pen as an outpatient.  Will ask RNs to allow patient to self-inject insulin while inpatient and patient agreeable to do so when he feels a little better.  Informed patient that Inpatient Diabetes team will continue to follow along and follow up again prior to discharge. Patient verbalized understanding and states that he has no questions at this time related to DM or insulin.  Thanks, Barnie Alderman, RN, MSN, CDE Diabetes Coordinator Inpatient Diabetes Program [phone]  (Team Pager from 8am to 5pm)

## 2019-08-07 NOTE — Progress Notes (Signed)
Pt. Refused to have his antibiotics and v/s taken overnight.  MD spoke with pt and he agreed on receiving the antibiotics but will refuse the v/s.

## 2019-08-07 NOTE — Progress Notes (Signed)
[facility]  Date of Admission:  07/31/2019      Total days of antibiotics 8  Day 1 Nafcillin   Day 6 Metronidazole            ASSESSMENT: William [name] is a 38 y.o. male with history of recent/active injection drug use here for sequela of disseminated MSSA bacteremia including R heel superficial abscess, L [place] joint & chest wall abscess, L2-L3 and L4-5 discitis/epidural abscess. TEE was determined to be negative for endocarditis. He has cleared his bacteremia and now POD 6 following chest wall debridement; R heel cultured out MSSA as well with drainage of abscess.   Discitis/Osteomyelitis with epidural abscess = would anticipate 8 weeks minimum IV therapy with repeated MRI and inflammatory markers at the end of planned therapy to consider oral antibiotic need thereafter.   Still with fevers overnight = most concerning source at this time is the purulent drainage coming from his sternoclavicular wound bed. CT surgery evaluation for return to OR for further I&D pending. He was switched to Nafcillin yesterday in light of the ongoing fevers and epidural abscess. His back pain has continued to improve.   Opioid Dependence, Withdrawals = management per primary team. Appreciate care.   Injection Drug Use = Uncertain if he is ready to quit. May need to discuss more harm reduction resources with him. HIV and Hep C Ab both negative on admission.    PLAN: 1. CT surgery evaluation re: OR take back for [place] joint pending  2. Continue nafcillin IV    Principal Problem:   Staphylococcus aureus bacteremia Active Problems:   Epidural abscess of spine due to infective embolism   Chest wall abscess   Abscess of right heel   Acute osteomyelitis of lumbar spine (HCC)   Suspected endocarditis   Sepsis (HCC)   Abscess of paraspinous muscles   Osteomyelitis of lumbar spine (HCC)   Abnormal chest CT   Septic embolism (HCC)   Community acquired pneumonia   IV  drug abuse (HCC)   Heroin abuse (HCC)   Elevated troponin   UTI (urinary tract infection)   Renal failure   Hyperglycemia    Chlorhexidine Gluconate Cloth  6 each Topical Daily   docusate sodium  100 mg Oral Daily   feeding supplement (GLUCERNA SHAKE)  237 mL Oral TID BM   heparin  5,000 Units Subcutaneous Q8H   insulin aspart  0-15 Units Subcutaneous TID WC   insulin aspart  0-5 Units Subcutaneous QHS   insulin glargine  18 Units Subcutaneous Daily   metoprolol tartrate  12.5 mg Oral BID   nystatin  5 mL Oral QID   ondansetron  4 mg Intravenous Once   polyethylene glycol  17 g Oral Daily   potassium chloride  40 mEq Oral BID   ramelteon  8 mg Oral QHS   senna-docusate  1 tablet Oral BID   sodium chloride flush  10-40 mL Intracatheter Q12H    SUBJECTIVE: Still having fevers and chills. Had a hard night with dry mouth, poor sleep and concern over withdrawals developing. He spoke with Dr. Jerral Ralph and his oxycodone adjusted. He says that he has injected heroin every day for 20 years. He is also taking xanax which does help. Knee is improved today and has an easier time with extension compared to yesterday.  WBC 11.6K TMax 101 F    Review of Systems: Review of Systems  Constitutional: Positive  for chills, diaphoresis and fever.  Respiratory: Negative for cough.   Cardiovascular: Negative for chest pain and leg swelling.  Gastrointestinal: Negative for abdominal pain, diarrhea and vomiting.  Genitourinary: Negative for dysuria.  Musculoskeletal: Positive for back pain and joint pain.  Neurological: Negative for weakness and headaches.    No Known Allergies  OBJECTIVE: Vitals:   08/06/19 1819 08/06/19 2057 08/07/19 0014 08/07/19 0408  BP: 95/62 (!) 143/82 (!) 150/80 140/81  Pulse: 78 100 93 92  Resp:   16 18  Temp: (!) 100.5 F (38.1 C) 98.1 F (36.7 C) 98.2 F (36.8 C) 99 F (37.2 C)  TempSrc: Oral Oral Oral Oral  SpO2:  100% 100% 99%  Weight:        Height:       Body mass index is 23.86 kg/m.  Physical Exam Vitals signs reviewed.  Constitutional:      Comments: Resting in recliner. A little anxious today.   HENT:     Mouth/Throat:     Mouth: Mucous membranes are dry.     Pharynx: Oropharynx is clear.  Eyes:     General: No scleral icterus. Neck:     Musculoskeletal: Normal range of motion.  Cardiovascular:     Rate and Rhythm: Tachycardia present.  Pulmonary:     Effort: Pulmonary effort is normal.     Breath sounds: Normal breath sounds.  Abdominal:     General: Bowel sounds are normal. There is no distension.     Comments: BM yesterday AM  Musculoskeletal:     Left knee: He exhibits normal range of motion, no swelling and no effusion. No tenderness found.       Arms:  Skin:    General: Skin is warm and dry.     Capillary Refill: Capillary refill takes less than 2 seconds.  Neurological:     General: No focal deficit present.     Mental Status: He is oriented to person, place, and time.     Lab Results Lab Results  Component Value Date   WBC 11.6 (H) 08/06/2019   HGB 8.4 (L) 08/06/2019   HCT 24.2 (L) 08/06/2019   MCV 88.3 08/06/2019   PLT 355 08/06/2019    Lab Results  Component Value Date   CREATININE 1.05 08/06/2019   BUN 21 (H) 08/06/2019   NA 133 (L) 08/06/2019   K 3.3 (L) 08/06/2019   CL 93 (L) 08/06/2019   CO2 28 08/06/2019    Lab Results  Component Value Date   ALT 15 08/06/2019   AST 20 08/06/2019   ALKPHOS 96 08/06/2019   BILITOT 0.9 08/06/2019     Microbiology: Recent Results (from the past 240 hour(s))  Urine culture     Status: Abnormal   Collection Time: 07/31/19 10:58 PM   Specimen: In/Out Cath Urine  Result Value Ref Range Status   Specimen Description IN/OUT CATH URINE  Final   Special Requests   Final    NONE Performed at [facility], [address] [place], [place] [address]    Culture >=100,000 COLONIES/mL STAPHYLOCOCCUS AUREUS (A)  Final   Report Status  08/02/2019 FINAL  Final   Organism ID, Bacteria STAPHYLOCOCCUS AUREUS (A)  Final      Susceptibility   Staphylococcus aureus - MIC*    CIPROFLOXACIN 1 SENSITIVE Sensitive     GENTAMICIN <=0.5 SENSITIVE Sensitive     NITROFURANTOIN <=16 SENSITIVE Sensitive     OXACILLIN 0.5 SENSITIVE Sensitive  TETRACYCLINE <=1 SENSITIVE Sensitive     VANCOMYCIN <=0.5 SENSITIVE Sensitive     TRIMETH/SULFA <=10 SENSITIVE Sensitive     CLINDAMYCIN <=0.25 SENSITIVE Sensitive     RIFAMPIN <=0.5 SENSITIVE Sensitive     Inducible Clindamycin NEGATIVE Sensitive     * >=100,000 COLONIES/mL STAPHYLOCOCCUS AUREUS  Blood culture (routine x 2)     Status: Abnormal   Collection Time: 08/01/19  1:36 AM   Specimen: BLOOD  Result Value Ref Range Status   Specimen Description BLOOD RIGHT ANTECUBITAL  Final   Special Requests   Final    BOTTLES DRAWN AEROBIC AND ANAEROBIC Blood Culture adequate volume   Culture  Setup Time   Final    GRAM POSITIVE COCCI IN BOTH AEROBIC AND ANAEROBIC BOTTLES CRITICAL RESULT CALLED TO, READ BACK BY AND VERIFIED WITH: Ihor Austin PharmD 17:20 08/01/19 ([email or web address]) Performed at [facility], [address], [place], [place] [address]    Culture STAPHYLOCOCCUS AUREUS (A)  Final   Report Status 08/03/2019 FINAL  Final   Organism ID, Bacteria STAPHYLOCOCCUS AUREUS  Final      Susceptibility   Staphylococcus aureus - MIC*    CIPROFLOXACIN <=0.5 SENSITIVE Sensitive     ERYTHROMYCIN <=0.25 SENSITIVE Sensitive     GENTAMICIN <=0.5 SENSITIVE Sensitive     OXACILLIN <=0.25 SENSITIVE Sensitive     TETRACYCLINE <=1 SENSITIVE Sensitive     VANCOMYCIN 1 SENSITIVE Sensitive     TRIMETH/SULFA <=10 SENSITIVE Sensitive     CLINDAMYCIN <=0.25 SENSITIVE Sensitive     RIFAMPIN <=0.5 SENSITIVE Sensitive     Inducible Clindamycin NEGATIVE Sensitive     * STAPHYLOCOCCUS AUREUS  Blood Culture ID Panel (Reflexed)     Status: Abnormal   Collection Time: 08/01/19  1:36 AM  Result Value Ref Range  Status   Enterococcus species NOT DETECTED NOT DETECTED Final   Listeria monocytogenes NOT DETECTED NOT DETECTED Final   Staphylococcus species DETECTED (A) NOT DETECTED Final    Comment: CRITICAL RESULT CALLED TO, READ BACK BY AND VERIFIED WITH: Ihor Austin PharmD 17:20 08/01/19 ([email or web address])    Staphylococcus aureus (BCID) DETECTED (A) NOT DETECTED Final    Comment: Methicillin (oxacillin) susceptible Staphylococcus aureus (MSSA). Preferred therapy is anti staphylococcal beta lactam antibiotic (Cefazolin or Nafcillin), unless clinically contraindicated. CRITICAL RESULT CALLED TO, READ BACK BY AND VERIFIED WITH: Ihor Austin PharmD 17:20 08/01/19 ([email or web address])    Methicillin resistance NOT DETECTED NOT DETECTED Final   Streptococcus species NOT DETECTED NOT DETECTED Final   Streptococcus agalactiae NOT DETECTED NOT DETECTED Final   Streptococcus pneumoniae NOT DETECTED NOT DETECTED Final   Streptococcus pyogenes NOT DETECTED NOT DETECTED Final   Acinetobacter baumannii NOT DETECTED NOT DETECTED Final   Enterobacteriaceae species NOT DETECTED NOT DETECTED Final   Enterobacter cloacae complex NOT DETECTED NOT DETECTED Final   Escherichia coli NOT DETECTED NOT DETECTED Final   Klebsiella oxytoca NOT DETECTED NOT DETECTED Final   Klebsiella pneumoniae NOT DETECTED NOT DETECTED Final   Proteus species NOT DETECTED NOT DETECTED Final   Serratia marcescens NOT DETECTED NOT DETECTED Final   Haemophilus influenzae NOT DETECTED NOT DETECTED Final   Neisseria meningitidis NOT DETECTED NOT DETECTED Final   Pseudomonas aeruginosa NOT DETECTED NOT DETECTED Final   Candida albicans NOT DETECTED NOT DETECTED Final   Candida glabrata NOT DETECTED NOT DETECTED Final   Candida krusei NOT DETECTED NOT DETECTED Final   Candida parapsilosis NOT DETECTED NOT DETECTED Final   Candida tropicalis NOT DETECTED NOT DETECTED  Final    Comment: Performed at [facility], [place] [address], [place], [place] [address]  SARS  Coronavirus 2 [facility] order, Performed in [facility] hospital lab) Nasopharyngeal Abscess     Status: None   Collection Time: 08/01/19  2:10 AM   Specimen: Abscess; Nasopharyngeal  Result Value Ref Range Status   SARS Coronavirus 2 NEGATIVE NEGATIVE Final    Comment: (NOTE) If result is NEGATIVE SARS-CoV-2 target nucleic acids are NOT DETECTED. The SARS-CoV-2 RNA is generally detectable in upper and lower  respiratory specimens during the acute phase of infection. The lowest  concentration of SARS-CoV-2 viral copies this assay can detect is 250  copies / mL. A negative result does not preclude SARS-CoV-2 infection  and should not be used as the sole basis for treatment or other  patient management decisions.  A negative result may occur with  improper specimen collection / handling, submission of specimen other  than nasopharyngeal swab, presence of viral mutation(s) within the  areas targeted by this assay, and inadequate number of viral copies  (<250 copies / mL). A negative result must be combined with clinical  observations, patient history, and epidemiological information. If result is POSITIVE SARS-CoV-2 target nucleic acids are DETECTED. The SARS-CoV-2 RNA is generally detectable in upper and lower  respiratory specimens dur ing the acute phase of infection.  Positive  results are indicative of active infection with SARS-CoV-2.  Clinical  correlation with patient history and other diagnostic information is  necessary to determine patient infection status.  Positive results do  not rule out bacterial infection or co-infection with other viruses. If result is PRESUMPTIVE POSTIVE SARS-CoV-2 nucleic acids MAY BE PRESENT.   A presumptive positive result was obtained on the submitted specimen  and confirmed on repeat testing.  While 2019 novel coronavirus  (SARS-CoV-2) nucleic acids may be present in the submitted sample  additional confirmatory testing may be necessary for  epidemiological  and / or clinical management purposes  to differentiate between  SARS-CoV-2 and other Sarbecovirus currently known to infect humans.  If clinically indicated additional testing with an alternate test  methodology [phone]) is advised. The SARS-CoV-2 RNA is generally  detectable in upper and lower respiratory sp ecimens during the acute  phase of infection. The expected result is Negative. Fact Sheet for Patients:  StrictlyIdeas.no Fact Sheet for Healthcare Providers: BankingDealers.co.za This test is not yet approved or cleared by the [place] FDA and has been authorized for detection and/or diagnosis of SARS-CoV-2 by FDA under an Emergency Use Authorization (EUA).  This EUA will remain in effect (meaning this test can be used) for the duration of the COVID-19 declaration under Section 564(b)(1) of the Act, 21 U.S.C. section 360bbb-3(b)(1), unless the authorization is terminated or revoked sooner. Performed at [facility], [place] [address] [place], [place] [address]   Wound or Superficial Culture     Status: None   Collection Time: 08/01/19  2:10 AM   Specimen: Abscess; Wound  Result Value Ref Range Status   Specimen Description ABSCESS  Final   Special Requests NONE  Final   Gram Stain   Final    RARE WBC PRESENT, PREDOMINANTLY PMN MODERATE GRAM POSITIVE COCCI IN CLUSTERS Performed at [facility], [place] [address] [place], [place] [address]    Culture ABUNDANT STAPHYLOCOCCUS AUREUS  Final   Report Status 08/03/2019 FINAL  Final   Organism ID, Bacteria STAPHYLOCOCCUS AUREUS  Final  Susceptibility   Staphylococcus aureus - MIC*    CIPROFLOXACIN <=0.5 SENSITIVE Sensitive     ERYTHROMYCIN <=0.25 SENSITIVE Sensitive     GENTAMICIN <=0.5 SENSITIVE Sensitive     OXACILLIN <=0.25 SENSITIVE Sensitive     TETRACYCLINE <=1 SENSITIVE Sensitive     VANCOMYCIN <=0.5 SENSITIVE Sensitive     TRIMETH/SULFA  <=10 SENSITIVE Sensitive     CLINDAMYCIN <=0.25 SENSITIVE Sensitive     RIFAMPIN <=0.5 SENSITIVE Sensitive     Inducible Clindamycin NEGATIVE Sensitive     * ABUNDANT STAPHYLOCOCCUS AUREUS  Blood culture (routine x 2)     Status: Abnormal   Collection Time: 08/01/19  2:40 AM   Specimen: BLOOD LEFT ARM  Result Value Ref Range Status   Specimen Description BLOOD LEFT ARM  Final   Special Requests   Final    BOTTLES DRAWN AEROBIC AND ANAEROBIC Blood Culture adequate volume   Culture  Setup Time   Final    GRAM POSITIVE COCCI IN BOTH AEROBIC AND ANAEROBIC BOTTLES CRITICAL VALUE NOTED.  VALUE IS CONSISTENT WITH PREVIOUSLY REPORTED AND CALLED VALUE.    Culture (A)  Final    STAPHYLOCOCCUS AUREUS SUSCEPTIBILITIES PERFORMED ON PREVIOUS CULTURE WITHIN THE LAST 5 DAYS. Performed at [facility], [address] [place], [place] [address]    Report Status 08/03/2019 FINAL  Final  MRSA PCR Screening     Status: None   Collection Time: 08/01/19  7:28 AM   Specimen: Nasal Mucosa; Nasopharyngeal  Result Value Ref Range Status   MRSA by PCR NEGATIVE NEGATIVE Final    Comment:        The GeneXpert MRSA Assay (FDA approved for NASAL specimens only), is one component of a comprehensive MRSA colonization surveillance program. It is not intended to diagnose MRSA infection nor to guide or monitor treatment for MRSA infections. Performed at [facility], [address] [place], [place] [address]   Aerobic/Anaerobic Culture (surgical/deep wound)     Status: None   Collection Time: 08/01/19  1:38 PM   Specimen: Abscess  Result Value Ref Range Status   Specimen Description ABSCESS LEFT CHEST WALL  Final   Special Requests PATIENT ON FOLLOWING MAXIPINE ANCEF  Final   Gram Stain   Final    ABUNDANT WBC PRESENT, PREDOMINANTLY PMN ABUNDANT GRAM POSITIVE COCCI    Culture   Final    ABUNDANT STAPHYLOCOCCUS AUREUS NO ANAEROBES ISOLATED Performed at [facility], [address] [place], [place] [address]    Report Status 08/06/2019 FINAL  Final   Organism ID, Bacteria STAPHYLOCOCCUS AUREUS  Final      Susceptibility   Staphylococcus aureus - MIC*    CIPROFLOXACIN <=0.5 SENSITIVE Sensitive     ERYTHROMYCIN <=0.25 SENSITIVE Sensitive     GENTAMICIN <=0.5 SENSITIVE Sensitive     OXACILLIN <=0.25 SENSITIVE Sensitive     TETRACYCLINE <=1 SENSITIVE Sensitive     VANCOMYCIN 1 SENSITIVE Sensitive     TRIMETH/SULFA <=10 SENSITIVE Sensitive     CLINDAMYCIN <=0.25 SENSITIVE Sensitive     RIFAMPIN <=0.5 SENSITIVE Sensitive     Inducible Clindamycin NEGATIVE Sensitive     * ABUNDANT STAPHYLOCOCCUS AUREUS  Culture, blood (routine x 2)     Status: None   Collection Time: 08/02/19 10:26 AM   Specimen: BLOOD RIGHT ARM  Result Value Ref Range Status   Specimen Description BLOOD RIGHT ARM  Final   Special Requests   Final    BOTTLES  DRAWN AEROBIC AND ANAEROBIC Blood Culture adequate volume   Culture   Final    NO GROWTH 5 DAYS Performed at [facility], [address] [place], [place] [address]    Report Status 08/07/2019 FINAL  Final  Culture, blood (routine x 2)     Status: None   Collection Time: 08/02/19 10:26 AM   Specimen: BLOOD LEFT ARM  Result Value Ref Range Status   Specimen Description BLOOD LEFT ARM  Final   Special Requests   Final    BOTTLES DRAWN AEROBIC ONLY Blood Culture adequate volume   Culture   Final    NO GROWTH 5 DAYS Performed at [facility], [address] [place], [place] [address]    Report Status 08/07/2019 FINAL  Final  Aerobic Culture (superficial specimen)     Status: None   Collection Time: 08/03/19 11:24 AM   Specimen: Heel; Wound  Result Value Ref Range Status   Specimen Description HEEL  Final   Special Requests Normal  Final   Gram Stain   Final    ABUNDANT WBC PRESENT, PREDOMINANTLY PMN ABUNDANT GRAM POSITIVE COCCI Performed at [facility], [address] [place], [place] [address]    Culture  ABUNDANT STAPHYLOCOCCUS AUREUS  Final   Report Status 08/05/2019 FINAL  Final   Organism ID, Bacteria STAPHYLOCOCCUS AUREUS  Final      Susceptibility   Staphylococcus aureus - MIC*    CIPROFLOXACIN <=0.5 SENSITIVE Sensitive     ERYTHROMYCIN <=0.25 SENSITIVE Sensitive     GENTAMICIN <=0.5 SENSITIVE Sensitive     OXACILLIN <=0.25 SENSITIVE Sensitive     TETRACYCLINE <=1 SENSITIVE Sensitive     VANCOMYCIN 1 SENSITIVE Sensitive     TRIMETH/SULFA <=10 SENSITIVE Sensitive     CLINDAMYCIN <=0.25 SENSITIVE Sensitive     RIFAMPIN <=0.5 SENSITIVE Sensitive     Inducible Clindamycin NEGATIVE Sensitive     * ABUNDANT STAPHYLOCOCCUS AUREUS  Culture, blood (routine x 2)     Status: None (Preliminary result)   Collection Time: 08/05/19 11:03 AM   Specimen: BLOOD RIGHT HAND  Result Value Ref Range Status   Specimen Description BLOOD RIGHT HAND  Final   Special Requests   Final    BOTTLES DRAWN AEROBIC ONLY Blood Culture results may not be optimal due to an inadequate volume of blood received in culture bottles   Culture   Final    NO GROWTH 2 DAYS Performed at [facility], [address] [place], [place] [address]    Report Status PENDING  Incomplete  Culture, blood (routine x 2)     Status: None (Preliminary result)   Collection Time: 08/05/19 11:04 AM   Specimen: BLOOD LEFT HAND  Result Value Ref Range Status   Specimen Description BLOOD LEFT HAND  Final   Special Requests   Final    BOTTLES DRAWN AEROBIC AND ANAEROBIC Blood Culture adequate volume   Culture   Final    NO GROWTH 2 DAYS Performed at [facility], [address] [place], [place] [address]    Report Status PENDING  Incomplete    Rexene AlbertsStephanie Reign Dziuba, MSN, NP-C [facility]  [email or web address] Pager: [phone] Office: [phone] [facility] Main Line: [phone]

## 2019-08-07 NOTE — Progress Notes (Signed)
New blister/abscess found on pt left heel. Foam dsg applied to leg. P. Angelica Pou RN

## 2019-08-08 LAB — CBC WITH DIFFERENTIAL/PLATELET
Abs Immature Granulocytes: 0.12 10*3/uL — ABNORMAL HIGH (ref 0.00–0.07)
Basophils Absolute: 0 10*3/uL (ref 0.0–0.1)
Basophils Relative: 0 %
Eosinophils Absolute: 0 10*3/uL (ref 0.0–0.5)
Eosinophils Relative: 0 %
HCT: 25.8 % — ABNORMAL LOW (ref 39.0–52.0)
Hemoglobin: 8.6 g/dL — ABNORMAL LOW (ref 13.0–17.0)
Immature Granulocytes: 1 %
Lymphocytes Relative: 9 %
Lymphs Abs: 1 10*3/uL (ref 0.7–4.0)
MCH: 30.4 pg (ref 26.0–34.0)
MCHC: 33.3 g/dL (ref 30.0–36.0)
MCV: 91.2 fL (ref 80.0–100.0)
Monocytes Absolute: 0.4 10*3/uL (ref 0.1–1.0)
Monocytes Relative: 4 %
Neutro Abs: 9.6 10*3/uL — ABNORMAL HIGH (ref 1.7–7.7)
Neutrophils Relative %: 86 %
Platelets: 431 10*3/uL — ABNORMAL HIGH (ref 150–400)
RBC: 2.83 MIL/uL — ABNORMAL LOW (ref 4.22–5.81)
RDW: 15.3 % (ref 11.5–15.5)
WBC: 11.1 10*3/uL — ABNORMAL HIGH (ref 4.0–10.5)
nRBC: 0 % (ref 0.0–0.2)

## 2019-08-08 LAB — GLUCOSE, CAPILLARY
Glucose-Capillary: 122 mg/dL — ABNORMAL HIGH (ref 70–99)
Glucose-Capillary: 233 mg/dL — ABNORMAL HIGH (ref 70–99)
Glucose-Capillary: 121 mg/dL — ABNORMAL HIGH (ref 70–99)
Glucose-Capillary: 207 mg/dL — ABNORMAL HIGH (ref 70–99)

## 2019-08-08 LAB — BASIC METABOLIC PANEL
Anion gap: 11 (ref 5–15)
BUN: 17 mg/dL (ref 6–20)
CO2: 28 mmol/L (ref 22–32)
Calcium: 7.8 mg/dL — ABNORMAL LOW (ref 8.9–10.3)
Chloride: 96 mmol/L — ABNORMAL LOW (ref 98–111)
Creatinine, Ser: 1 mg/dL (ref 0.61–1.24)
GFR calc Af Amer: >60 mL/min (ref >60)
GFR calc non Af Amer: >60 mL/min (ref >60)
Glucose, Bld: 260 mg/dL — ABNORMAL HIGH (ref 70–99)
Potassium: 3.3 mmol/L — ABNORMAL LOW (ref 3.5–5.1)
Sodium: 135 mmol/L (ref 135–145)

## 2019-08-08 LAB — MAGNESIUM: Magnesium: 1.9 mg/dL (ref 1.7–2.4)

## 2019-08-08 LAB — PHOSPHORUS: Phosphorus: 2.9 mg/dL (ref 2.5–4.6)

## 2019-08-08 MED ORDER — POTASSIUM CHLORIDE CRYS ER 10 MEQ PO TBCR
10.0000 meq | EXTENDED_RELEASE_TABLET | Freq: Two times a day (BID) | ORAL | Status: AC
  Administered 2019-08-08: 10 meq via ORAL
  Filled 2019-08-08: qty 1

## 2019-08-08 NOTE — Progress Notes (Signed)
Physical Therapy Treatment Patient Details Name: William [name] MRN: [id number] DOB: September 20, 1981 Today's Date: 08/08/2019    History of Present Illness Patient is a 38 y/o male who presents with lower back pain, left shoulder pain, right foot and heel pain. Found to have right heel abscess s/p I&D 9/4, left upper chest wall abscess and mass s/p I&D and wound vac application 9/3, new onset DM, sepsis with MSSA bacteremia due to IV drug use. CT scan- left anterior chest wall soft tissue and intramuscular infection with extension into the thoracic cavity, air in the soft tissues of the right axilla adjacent to the right shoulder, multiple small pulmonary nodules. PMH- heroin IV drug use.    PT Comments    Pt progressing well. Pt requiring max encouragement for OOB mobility as patient very anxious and self limiting. Focused on R shoulder ROM and exercise as pt very tight and restricted. Provided manual stimulation to back to promote upright, optimal posture. Pt amb 200' with RW with minimal R heel pain. Acute PT to cont to follow.    Follow Up Recommendations  Home health PT;Supervision for mobility/OOB     Equipment Recommendations  Rolling walker with 5" wheels    Recommendations for Other Services       Precautions / Restrictions Precautions Precautions: Fall Precaution Comments: self limiting, anxious Restrictions Weight Bearing Restrictions: No    Mobility  Bed Mobility Overal bed mobility: Modified Independent Bed Mobility: Supine to Sit;Sit to Supine     Supine to sit: Modified independent (Device/Increase time);HOB elevated Sit to supine: Modified independent (Device/Increase time);HOB elevated   General bed mobility comments: pt wouldn't let HOB be down, increased time, assist for line management only  Transfers Overall transfer level: Needs assistance Equipment used: Rolling walker (2 wheeled) Transfers: Sit to/from Stand Sit to Stand: Min assist         General  transfer comment: minA to steady during transition of hands, bed elevated due to pt 6'2", increased time  Ambulation/Gait Ambulation/Gait assistance: Min assist Gait Distance (Feet): 200 Feet Assistive device: Rolling walker (2 wheeled) Gait Pattern/deviations: Step-through pattern Gait velocity: slow Gait velocity interpretation: <1.31 ft/sec, indicative of household ambulator General Gait Details: much improved from monday. verbal cues to decreased bilat UE WBing and not step past front of walker. Pt with minimal bilat foot pain. Pt asked to return to room due to "I am freezing and my breaths are getting shallow" pt with no episodes of LOB with RW   Stairs             Wheelchair Mobility    Modified Rankin (Stroke Patients Only)       Balance Overall balance assessment: Needs assistance Sitting-balance support: Feet supported;Bilateral upper extremity supported Sitting balance-Leahy Scale: Good     Standing balance support: Bilateral upper extremity supported;During functional activity Standing balance-Leahy Scale: Poor Standing balance comment: Reliant on BUEs for support in standing.                            Cognition Arousal/Alertness: Awake/alert Behavior During Therapy: Anxious Overall Cognitive Status: Within Functional Limits for tasks assessed                                 General Comments: pt anxious constantly asking for something to drink and to stop during session becauase "my breath is getting shallow"  Exercises Other Exercises Other Exercises: PROM/AAROM to R shoulder, pt with limited active shld flx to 45 deg Other Exercises: educated and pt returned demonstrated using L UE to assist R UE into shld flexion and horizontal abduction/adduction Other Exercises: completed scapular retraction x 10 reps with 5 sec hold    General Comments General comments (skin integrity, edema, etc.): educated on importance of OOB  mobility and not staying in bed      Pertinent Vitals/Pain Pain Assessment: Faces Faces Pain Scale: Hurts even more Pain Location: low back with movement Pain Descriptors / Indicators: Sore Pain Intervention(s): Monitored during session    Home Living                      Prior Function            PT Goals (current goals can now be found in the care plan section) Progress towards PT goals: Progressing toward goals    Frequency    Min 3X/week      PT Plan Current plan remains appropriate    Co-evaluation              AM-PAC PT "6 Clicks" Mobility   Outcome Measure  Help needed turning from your back to your side while in a flat bed without using bedrails?: A Little Help needed moving from lying on your back to sitting on the side of a flat bed without using bedrails?: A Little Help needed moving to and from a bed to a chair (including a wheelchair)?: A Little Help needed standing up from a chair using your arms (e.g., wheelchair or bedside chair)?: A Little Help needed to walk in hospital room?: A Little Help needed climbing 3-5 steps with a railing? : A Lot 6 Click Score: 17    End of Session Equipment Utilized During Treatment: Gait belt Activity Tolerance: Patient tolerated treatment well Patient left: in bed;with call bell/phone within reach Nurse Communication: Mobility status PT Visit Diagnosis: Pain;Unsteadiness on feet (R26.81);Muscle weakness (generalized) (M62.81);Difficulty in walking, not elsewhere classified (R26.2) Pain - part of body: (back)     Time: 3845-3646 PT Time Calculation (min) (ACUTE ONLY): 37 min  Charges:  $Gait Training: 8-22 mins $Therapeutic Exercise: 8-22 mins                     Kittie Plater, PT, DPT Acute Rehabilitation Services Pager #: [phone] Office #: [phone]    Berline Lopes 08/08/2019, 11:22 AM

## 2019-08-08 NOTE — Progress Notes (Signed)
Inpatient Diabetes Program Recommendations  AACE/ADA: New Consensus Statement on Inpatient Glycemic Control   Target Ranges:  Prepandial:   less than 140 mg/dL      Peak postprandial:   less than 180 mg/dL (1-2 hours)      Critically ill patients:  140 - 180 mg/dL   Results for William, [name] (MRN [id number]) as of 08/08/2019 11:27  Ref. Range 08/07/2019 06:17 08/07/2019 12:08 08/07/2019 17:28 08/07/2019 21:12 08/08/2019 06:07  Glucose-Capillary Latest Ref Range: 70 - 99 mg/dL 236 (H) 158 (H) 196 (H) 123 (H) 122 (H)   Review of Glycemic Control  Diabetes history: New DM dx this admission Outpatient Diabetes medications: NA Current orders for Inpatient glycemic control: Lantus 18 units daily, Novolog 0-15 units TID with meals, Novolog 0-5 units QHS  Inpatient Diabetes Program Recommendations:   Insulin-Meal Coverage: Per chart, only eating about 20% of meals. Once PO intake increases and patient is eating at least 50% of meals and if glucose consistently greater than 180 mg/dl, please consider ordering Novolog 3 units TID with meals for meal coverage if patient eats at least 50% of meals.  Thanks, Barnie Alderman, RN, MSN, CDE Diabetes Coordinator Inpatient Diabetes Program [phone] (Team Pager from 8am to 5pm)

## 2019-08-08 NOTE — Progress Notes (Signed)
PROGRESS NOTE    BARRIE WALE  ZOX:[id number] DOB: Jan 30, 1981 DOA: 07/31/2019 PCP: Patient, No Pcp Per    Brief Narrative:  38 year old no previous medical history, heroin injection for last 10 years presented to emergency room on 9/1 with multiple complaints including lower back pain, left shoulder pain, right foot and heel pain.  In the emergency room, he has right heel abscess, left upper chest wall abscess and mass, CT scan noted left anterior chest wall soft tissue and intramuscular infection with extension into the thoracic cavity, air in the soft tissues of the right axilla adjacent to the right shoulder, multiple small pulmonary nodules.  Patient was admitted to ICU.  Underwent surgical debridement of the chest wall abscess and wound VAC placement.  Also found to have new diagnosis of diabetes.  MRI showed multiple epidural and paraspinal abscesses. Blood cultures 9/2: MSSA Blood cultures 9/3: Pending, no growth so far. Wound culture 9/2: MSSA. Right heel culture 9/3: Abundant WBC.  MSSA. Repeat c/s : 9/3,9/6 negative so far.    Assessment & Plan:   Principal Problem:   Staphylococcus aureus bacteremia Active Problems:   Sepsis (HCC)   Abscess of paraspinous muscles   Osteomyelitis of lumbar spine (HCC)   Epidural abscess of spine due to infective embolism   Abnormal chest CT   Septic embolism (HCC)   Community acquired pneumonia   IV drug abuse (HCC)   Heroin abuse (HCC)   Elevated troponin   UTI (urinary tract infection)   Renal failure   Hyperglycemia   Suspected endocarditis   Chest wall abscess   Abscess of right heel   Acute osteomyelitis of lumbar spine (HCC)  Sepsis present on admission with MSSA bacteremia due to IV drug use: Septic pulmonary emboli, Left chest wall abscess, Right heel abscess, Extensive right posterolateral epidural abscess L2-L3, Osteomyelitis of the L5-S1, Posterior epidural abscess L5-S1 S2. - Status post incision and drainage and  debridement left chest wall on wound VAC, followed by CT CS. Grossly purulent continuous drain. - Bedside I&D right heel, MSSA. - Multiple epidural abscess and discitis, seen by neurosurgery recommended no surgical drainage but antibiotics. - followed by infectious disease.  He was on cefazolin and metronidazole.  Now remains on nafcillin.  Repeat cultures were negative. -Afebrile last 24 hours.   - Intraoperative TEE with no evidence of endocarditis. - Anticipate intravenous antibiotics for about 6 to 8 weeks.  Patient has exhausted IV access.  PICC line given.  IV drug use: Ongoing opiate use.  Patient is motivated to quit.  Will provide symptomatic treatment.  Opiates for pain relief for postoperative pain.  Will use Tylenol and muscle relaxants. Used metoprolol for tachycardia. Patient to use Xanax for craving and anxiety.  New onset diabetes: A1c 7.8.  Started on insulin.  Stabilizing on increased dose of insulin. Keep on sliding scale insulin.  Stay on current regimen today.  Blood sugars are mostly less than 200.  MiraLAX and Colace for constipation. Advised ambulation. New left heel pain, no evidence of fluctuation or underlying abscess, will closely follow for development of abscess.   DVT prophylaxis: Heparin subcu Code Status: Full code Family Communication: None Disposition Plan: Inpatient   Consultants:   PCCM, transfer out  CT CS, following  Neurosurgery, signed off  Orthopedics, signed off  Procedures:   Chest wall abscess incision and drainage, 08/02/2019  Right heel incision and drainage, 08/03/2019  Antimicrobials:   Vancomycin, 08/01/2019>>> 08/02/2019  Cefepime, 08/01/2019>>> 08/02/2019  Cefazolin, 08/02/2019>>>>>  08/06/2019  Flagyl, 08/02/2019>>> 08/06/2019  Diflucan, on 08/03/2019 dose  Nystatin swish and swallow, 08/03/2019 >>>  Nafcillin 08/06/2019>>>>   Subjective: Patient seen and examined.  Afebrile overnight.  Did better regards to pain and anxiety after  increasing dose of oxycodone to every 4 hours. Left medial heel pain.  Knee pain and thigh pain improved.  No nausea or vomiting. Back pain persist.  Objective: Vitals:   08/07/19 1100 08/07/19 2002 08/08/19 0349 08/08/19 0810  BP: (!) 141/78 (!) 150/83 137/85 (!) 149/86  Pulse: 87 94 89 88  Resp: 18 16 17 18   Temp: 98.8 F (37.1 C) 98.2 F (36.8 C) 98.1 F (36.7 C) 98.1 F (36.7 C)  TempSrc: Oral Oral Oral Oral  SpO2: 100% 99% 100% 100%  Weight:      Height:        Intake/Output Summary (Last 24 hours) at 08/08/2019 1103 Last data filed at 08/08/2019 0900 Gross per 24 hour  Intake 1580 ml  Output 50 ml  Net 1530 ml   Filed Weights   08/03/19 0500 08/04/19 0500 08/05/19 0500  Weight: 89.3 kg 84 kg 84.3 kg    Examination:  General exam: Comfortable. Without any distress.  Afebrile.  On room air. Respiratory system: Clear to auscultation. Respiratory effort normal. Cardiovascular system: S1 & S2 heard, RRR. No JVD, murmurs, rubs, gallops or clicks. No pedal edema. Left anterior chest wall wound with wound VAC, draining pus. Gastrointestinal system: Abdomen is nondistended, soft and nontender. No organomegaly or masses felt. Normal bowel sounds heard. Central nervous system: Alert and oriented. No focal neurological deficits. Extremities: Symmetric 5 x 5 power.  No sensory deficits. Skin: No rashes, lesions.  Right heel incision site clean and dry, no drainage.  Left medial heel, with some tenderness, no underlying collection or fluctuation, may be developing abscess, will monitor. Bilateral knee and other joints with no effusion, no redness or erythema. Psychiatry: Judgement and insight appear normal. Mood & affect anxious.    Data Reviewed: I have personally reviewed following labs and imaging studies  CBC: Recent Labs  Lab 08/02/19 0309 08/04/19 0352 08/06/19 0738 08/08/19 1038  WBC 13.4* 12.1* 11.6* 11.1*  NEUTROABS  --  9.9* 9.8* 9.6*  HGB 9.1* 9.7* 8.4* 8.6*   HCT 24.8* 28.4* 24.2* 25.8*  MCV 84.6 88.5 88.3 91.2  PLT 336 450* 355 431*   Basic Metabolic Panel: Recent Labs  Lab 08/04/19 0352 08/06/19 0738  NA 134* 133*  K 3.0* 3.3*  CL 95* 93*  CO2 27 28  GLUCOSE 190* 196*  BUN 28* 21*  CREATININE 1.24 1.05  CALCIUM 8.0* 7.4*  MG 1.9 1.7  PHOS 3.9 2.7   GFR: Estimated Creatinine Clearance: 110.9 mL/min (by C-G formula based on SCr of 1.05 mg/dL). Liver Function Tests: Recent Labs  Lab 08/06/19 0738  AST 20  ALT 15  ALKPHOS 96  BILITOT 0.9  PROT 5.5*  ALBUMIN 1.3*   No results for input(s): LIPASE, AMYLASE in the last 168 hours. No results for input(s): AMMONIA in the last 168 hours. Coagulation Profile: No results for input(s): INR, PROTIME in the last 168 hours. Cardiac Enzymes: No results for input(s): CKTOTAL, CKMB, CKMBINDEX, TROPONINI in the last 168 hours. BNP (last 3 results) No results for input(s): PROBNP in the last 8760 hours. HbA1C: No results for input(s): HGBA1C in the last 72 hours. CBG: Recent Labs  Lab 08/07/19 0617 08/07/19 1208 08/07/19 1728 08/07/19 2112 08/08/19 0607  GLUCAP 236* 158* 196* 123*  122*   Lipid Profile: No results for input(s): CHOL, HDL, LDLCALC, TRIG, CHOLHDL, LDLDIRECT in the last 72 hours. Thyroid Function Tests: No results for input(s): TSH, T4TOTAL, FREET4, T3FREE, THYROIDAB in the last 72 hours. Anemia Panel: No results for input(s): VITAMINB12, FOLATE, FERRITIN, TIBC, IRON, RETICCTPCT in the last 72 hours. Sepsis Labs: No results for input(s): PROCALCITON, LATICACIDVEN in the last 168 hours.  Recent Results (from the past 240 hour(s))  Urine culture     Status: Abnormal   Collection Time: 07/31/19 10:58 PM   Specimen: In/Out Cath Urine  Result Value Ref Range Status   Specimen Description IN/OUT CATH URINE  Final   Special Requests   Final    NONE Performed at [facility], [address] [place], [place] [address]    Culture >=100,000 COLONIES/mL  STAPHYLOCOCCUS AUREUS (A)  Final   Report Status 08/02/2019 FINAL  Final   Organism ID, Bacteria STAPHYLOCOCCUS AUREUS (A)  Final      Susceptibility   Staphylococcus aureus - MIC*    CIPROFLOXACIN 1 SENSITIVE Sensitive     GENTAMICIN <=0.5 SENSITIVE Sensitive     NITROFURANTOIN <=16 SENSITIVE Sensitive     OXACILLIN 0.5 SENSITIVE Sensitive     TETRACYCLINE <=1 SENSITIVE Sensitive     VANCOMYCIN <=0.5 SENSITIVE Sensitive     TRIMETH/SULFA <=10 SENSITIVE Sensitive     CLINDAMYCIN <=0.25 SENSITIVE Sensitive     RIFAMPIN <=0.5 SENSITIVE Sensitive     Inducible Clindamycin NEGATIVE Sensitive     * >=100,000 COLONIES/mL STAPHYLOCOCCUS AUREUS  Blood culture (routine x 2)     Status: Abnormal   Collection Time: 08/01/19  1:36 AM   Specimen: BLOOD  Result Value Ref Range Status   Specimen Description BLOOD RIGHT ANTECUBITAL  Final   Special Requests   Final    BOTTLES DRAWN AEROBIC AND ANAEROBIC Blood Culture adequate volume   Culture  Setup Time   Final    GRAM POSITIVE COCCI IN BOTH AEROBIC AND ANAEROBIC BOTTLES CRITICAL RESULT CALLED TO, READ BACK BY AND VERIFIED WITH: Dion Body PharmD 17:20 08/01/19 ([email or web address]) Performed at [facility], [address] [place], [place] [address]    Culture STAPHYLOCOCCUS AUREUS (A)  Final   Report Status 08/03/2019 FINAL  Final   Organism ID, Bacteria STAPHYLOCOCCUS AUREUS  Final      Susceptibility   Staphylococcus aureus - MIC*    CIPROFLOXACIN <=0.5 SENSITIVE Sensitive     ERYTHROMYCIN <=0.25 SENSITIVE Sensitive     GENTAMICIN <=0.5 SENSITIVE Sensitive     OXACILLIN <=0.25 SENSITIVE Sensitive     TETRACYCLINE <=1 SENSITIVE Sensitive     VANCOMYCIN 1 SENSITIVE Sensitive     TRIMETH/SULFA <=10 SENSITIVE Sensitive     CLINDAMYCIN <=0.25 SENSITIVE Sensitive     RIFAMPIN <=0.5 SENSITIVE Sensitive     Inducible Clindamycin NEGATIVE Sensitive     * STAPHYLOCOCCUS AUREUS  Blood Culture ID Panel (Reflexed)     Status: Abnormal   Collection  Time: 08/01/19  1:36 AM  Result Value Ref Range Status   Enterococcus species NOT DETECTED NOT DETECTED Final   Listeria monocytogenes NOT DETECTED NOT DETECTED Final   Staphylococcus species DETECTED (A) NOT DETECTED Final    Comment: CRITICAL RESULT CALLED TO, READ BACK BY AND VERIFIED WITH: Dion Body PharmD 17:20 08/01/19 ([email or web address])    Staphylococcus aureus (BCID) DETECTED (A) NOT DETECTED Final    Comment: Methicillin (oxacillin) susceptible Staphylococcus aureus (MSSA). Preferred therapy is anti staphylococcal beta lactam  antibiotic (Cefazolin or Nafcillin), unless clinically contraindicated. CRITICAL RESULT CALLED TO, READ BACK BY AND VERIFIED WITH: Ihor Austin PharmD 17:20 08/01/19 ([email or web address])    Methicillin resistance NOT DETECTED NOT DETECTED Final   Streptococcus species NOT DETECTED NOT DETECTED Final   Streptococcus agalactiae NOT DETECTED NOT DETECTED Final   Streptococcus pneumoniae NOT DETECTED NOT DETECTED Final   Streptococcus pyogenes NOT DETECTED NOT DETECTED Final   Acinetobacter baumannii NOT DETECTED NOT DETECTED Final   Enterobacteriaceae species NOT DETECTED NOT DETECTED Final   Enterobacter cloacae complex NOT DETECTED NOT DETECTED Final   Escherichia coli NOT DETECTED NOT DETECTED Final   Klebsiella oxytoca NOT DETECTED NOT DETECTED Final   Klebsiella pneumoniae NOT DETECTED NOT DETECTED Final   Proteus species NOT DETECTED NOT DETECTED Final   Serratia marcescens NOT DETECTED NOT DETECTED Final   Haemophilus influenzae NOT DETECTED NOT DETECTED Final   Neisseria meningitidis NOT DETECTED NOT DETECTED Final   Pseudomonas aeruginosa NOT DETECTED NOT DETECTED Final   Candida albicans NOT DETECTED NOT DETECTED Final   Candida glabrata NOT DETECTED NOT DETECTED Final   Candida krusei NOT DETECTED NOT DETECTED Final   Candida parapsilosis NOT DETECTED NOT DETECTED Final   Candida tropicalis NOT DETECTED NOT DETECTED Final    Comment: Performed at [facility],  [address] [place], [place] [address]  SARS Coronavirus 2 [facility] order, Performed in [facility] hospital lab) Nasopharyngeal Abscess     Status: None   Collection Time: 08/01/19  2:10 AM   Specimen: Abscess; Nasopharyngeal  Result Value Ref Range Status   SARS Coronavirus 2 NEGATIVE NEGATIVE Final    Comment: (NOTE) If result is NEGATIVE SARS-CoV-2 target nucleic acids are NOT DETECTED. The SARS-CoV-2 RNA is generally detectable in upper and lower  respiratory specimens during the acute phase of infection. The lowest  concentration of SARS-CoV-2 viral copies this assay can detect is 250  copies / mL. A negative result does not preclude SARS-CoV-2 infection  and should not be used as the sole basis for treatment or other  patient management decisions.  A negative result may occur with  improper specimen collection / handling, submission of specimen other  than nasopharyngeal swab, presence of viral mutation(s) within the  areas targeted by this assay, and inadequate number of viral copies  (<250 copies / mL). A negative result must be combined with clinical  observations, patient history, and epidemiological information. If result is POSITIVE SARS-CoV-2 target nucleic acids are DETECTED. The SARS-CoV-2 RNA is generally detectable in upper and lower  respiratory specimens dur ing the acute phase of infection.  Positive  results are indicative of active infection with SARS-CoV-2.  Clinical  correlation with patient history and other diagnostic information is  necessary to determine patient infection status.  Positive results do  not rule out bacterial infection or co-infection with other viruses. If result is PRESUMPTIVE POSTIVE SARS-CoV-2 nucleic acids MAY BE PRESENT.   A presumptive positive result was obtained on the submitted specimen  and confirmed on repeat testing.  While 2019 novel coronavirus  (SARS-CoV-2) nucleic acids may be present in the submitted sample  additional  confirmatory testing may be necessary for epidemiological  and / or clinical management purposes  to differentiate between  SARS-CoV-2 and other Sarbecovirus currently known to infect humans.  If clinically indicated additional testing with an alternate test  methodology [phone]) is advised. The SARS-CoV-2 RNA is generally  detectable in upper and lower respiratory sp ecimens during the acute  phase of infection. The expected result is Negative. Fact Sheet for Patients:  BoilerBrush.com.cy Fact Sheet for Healthcare Providers: https://pope.com/ This test is not yet approved or cleared by the [place] FDA and has been authorized for detection and/or diagnosis of SARS-CoV-2 by FDA under an Emergency Use Authorization (EUA).  This EUA will remain in effect (meaning this test can be used) for the duration of the COVID-19 declaration under Section 564(b)(1) of the Act, 21 U.S.C. section 360bbb-3(b)(1), unless the authorization is terminated or revoked sooner. Performed at [facility], [address], [place], [place] [address]   Wound or Superficial Culture     Status: None   Collection Time: 08/01/19  2:10 AM   Specimen: Abscess; Wound  Result Value Ref Range Status   Specimen Description ABSCESS  Final   Special Requests NONE  Final   Gram Stain   Final    RARE WBC PRESENT, PREDOMINANTLY PMN MODERATE GRAM POSITIVE COCCI IN CLUSTERS Performed at [facility], [address] [place], [place] [address]    Culture ABUNDANT STAPHYLOCOCCUS AUREUS  Final   Report Status 08/03/2019 FINAL  Final   Organism ID, Bacteria STAPHYLOCOCCUS AUREUS  Final      Susceptibility   Staphylococcus aureus - MIC*    CIPROFLOXACIN <=0.5 SENSITIVE Sensitive     ERYTHROMYCIN <=0.25 SENSITIVE Sensitive     GENTAMICIN <=0.5 SENSITIVE Sensitive     OXACILLIN <=0.25 SENSITIVE Sensitive     TETRACYCLINE <=1 SENSITIVE Sensitive     VANCOMYCIN  <=0.5 SENSITIVE Sensitive     TRIMETH/SULFA <=10 SENSITIVE Sensitive     CLINDAMYCIN <=0.25 SENSITIVE Sensitive     RIFAMPIN <=0.5 SENSITIVE Sensitive     Inducible Clindamycin NEGATIVE Sensitive     * ABUNDANT STAPHYLOCOCCUS AUREUS  Blood culture (routine x 2)     Status: Abnormal   Collection Time: 08/01/19  2:40 AM   Specimen: BLOOD LEFT ARM  Result Value Ref Range Status   Specimen Description BLOOD LEFT ARM  Final   Special Requests   Final    BOTTLES DRAWN AEROBIC AND ANAEROBIC Blood Culture adequate volume   Culture  Setup Time   Final    GRAM POSITIVE COCCI IN BOTH AEROBIC AND ANAEROBIC BOTTLES CRITICAL VALUE NOTED.  VALUE IS CONSISTENT WITH PREVIOUSLY REPORTED AND CALLED VALUE.    Culture (A)  Final    STAPHYLOCOCCUS AUREUS SUSCEPTIBILITIES PERFORMED ON PREVIOUS CULTURE WITHIN THE LAST 5 DAYS. Performed at [facility], [address], [place], [place] [address]    Report Status 08/03/2019 FINAL  Final  MRSA PCR Screening     Status: None   Collection Time: 08/01/19  7:28 AM   Specimen: Nasal Mucosa; Nasopharyngeal  Result Value Ref Range Status   MRSA by PCR NEGATIVE NEGATIVE Final    Comment:        The GeneXpert MRSA Assay (FDA approved for NASAL specimens only), is one component of a comprehensive MRSA colonization surveillance program. It is not intended to diagnose MRSA infection nor to guide or monitor treatment for MRSA infections. Performed at [facility], [address] [place], [place] [address]   Aerobic/Anaerobic Culture (surgical/deep wound)     Status: None   Collection Time: 08/01/19  1:38 PM   Specimen: Abscess  Result Value Ref Range Status   Specimen Description ABSCESS LEFT CHEST WALL  Final   Special Requests PATIENT ON FOLLOWING MAXIPINE ANCEF  Final   Gram Stain  Final    ABUNDANT WBC PRESENT, PREDOMINANTLY PMN ABUNDANT GRAM POSITIVE COCCI    Culture   Final    ABUNDANT STAPHYLOCOCCUS AUREUS NO ANAEROBES ISOLATED  Performed at [facility], [address] [place], [place] [address]    Report Status 08/06/2019 FINAL  Final   Organism ID, Bacteria STAPHYLOCOCCUS AUREUS  Final      Susceptibility   Staphylococcus aureus - MIC*    CIPROFLOXACIN <=0.5 SENSITIVE Sensitive     ERYTHROMYCIN <=0.25 SENSITIVE Sensitive     GENTAMICIN <=0.5 SENSITIVE Sensitive     OXACILLIN <=0.25 SENSITIVE Sensitive     TETRACYCLINE <=1 SENSITIVE Sensitive     VANCOMYCIN 1 SENSITIVE Sensitive     TRIMETH/SULFA <=10 SENSITIVE Sensitive     CLINDAMYCIN <=0.25 SENSITIVE Sensitive     RIFAMPIN <=0.5 SENSITIVE Sensitive     Inducible Clindamycin NEGATIVE Sensitive     * ABUNDANT STAPHYLOCOCCUS AUREUS  Culture, blood (routine x 2)     Status: None   Collection Time: 08/02/19 10:26 AM   Specimen: BLOOD RIGHT ARM  Result Value Ref Range Status   Specimen Description BLOOD RIGHT ARM  Final   Special Requests   Final    BOTTLES DRAWN AEROBIC AND ANAEROBIC Blood Culture adequate volume   Culture   Final    NO GROWTH 5 DAYS Performed at [facility], [address] [place], [place] [address]    Report Status 08/07/2019 FINAL  Final  Culture, blood (routine x 2)     Status: None   Collection Time: 08/02/19 10:26 AM   Specimen: BLOOD LEFT ARM  Result Value Ref Range Status   Specimen Description BLOOD LEFT ARM  Final   Special Requests   Final    BOTTLES DRAWN AEROBIC ONLY Blood Culture adequate volume   Culture   Final    NO GROWTH 5 DAYS Performed at [facility], [address] [place], [place] [address]    Report Status 08/07/2019 FINAL  Final  Aerobic Culture (superficial specimen)     Status: None   Collection Time: 08/03/19 11:24 AM   Specimen: Heel; Wound  Result Value Ref Range Status   Specimen Description HEEL  Final   Special Requests Normal  Final   Gram Stain   Final    ABUNDANT WBC PRESENT, PREDOMINANTLY PMN ABUNDANT GRAM POSITIVE COCCI Performed at [facility], [address] [place], [place] [address]    Culture ABUNDANT STAPHYLOCOCCUS AUREUS  Final   Report Status 08/05/2019 FINAL  Final   Organism ID, Bacteria STAPHYLOCOCCUS AUREUS  Final      Susceptibility   Staphylococcus aureus - MIC*    CIPROFLOXACIN <=0.5 SENSITIVE Sensitive     ERYTHROMYCIN <=0.25 SENSITIVE Sensitive     GENTAMICIN <=0.5 SENSITIVE Sensitive     OXACILLIN <=0.25 SENSITIVE Sensitive     TETRACYCLINE <=1 SENSITIVE Sensitive     VANCOMYCIN 1 SENSITIVE Sensitive     TRIMETH/SULFA <=10 SENSITIVE Sensitive     CLINDAMYCIN <=0.25 SENSITIVE Sensitive     RIFAMPIN <=0.5 SENSITIVE Sensitive     Inducible Clindamycin NEGATIVE Sensitive     * ABUNDANT STAPHYLOCOCCUS AUREUS  Culture, blood (routine x 2)     Status: None (Preliminary result)   Collection Time: 08/05/19 11:03 AM   Specimen: BLOOD RIGHT HAND  Result Value Ref Range Status   Specimen Description BLOOD RIGHT HAND  Final   Special Requests   Final  BOTTLES DRAWN AEROBIC ONLY Blood Culture results may not be optimal due to an inadequate volume of blood received in culture bottles   Culture   Final    NO GROWTH 3 DAYS Performed at [facility], [address] [place], [place] [address]    Report Status PENDING  Incomplete  Culture, blood (routine x 2)     Status: None (Preliminary result)   Collection Time: 08/05/19 11:04 AM   Specimen: BLOOD LEFT HAND  Result Value Ref Range Status   Specimen Description BLOOD LEFT HAND  Final   Special Requests   Final    BOTTLES DRAWN AEROBIC AND ANAEROBIC Blood Culture adequate volume   Culture   Final    NO GROWTH 3 DAYS Performed at [facility], [address] [place], [place] [address]    Report Status PENDING  Incomplete  Surgical PCR screen     Status: Abnormal   Collection Time: 08/07/19  5:18 PM   Specimen: Nasal Mucosa; Nasal Swab  Result Value Ref Range Status   MRSA, PCR NEGATIVE NEGATIVE Final   Staphylococcus aureus POSITIVE (A) NEGATIVE Final     Comment: (NOTE) The Xpert SA Assay (FDA approved for NASAL specimens in patients 39 years of age and older), is one component of a comprehensive surveillance program. It is not intended to diagnose infection nor to guide or monitor treatment. Performed at [facility], [address] [place], [place] [address]          Radiology Studies: No results found.      Scheduled Meds: . Chlorhexidine Gluconate Cloth  6 each Topical Daily  . docusate sodium  100 mg Oral Daily  . feeding supplement (GLUCERNA SHAKE)  237 mL Oral TID BM  . heparin  5,000 Units Subcutaneous Q8H  . insulin aspart  0-15 Units Subcutaneous TID WC  . insulin aspart  0-5 Units Subcutaneous QHS  . insulin glargine  18 Units Subcutaneous Daily  . metoprolol tartrate  12.5 mg Oral BID  . mupirocin ointment  1 application Nasal BID  . nystatin  5 mL Oral QID  . ondansetron  4 mg Intravenous Once  . polyethylene glycol  17 g Oral Daily  . potassium chloride  10 mEq Oral BID  . senna-docusate  1 tablet Oral BID  . sodium chloride flush  10-40 mL Intracatheter Q12H   Continuous Infusions: . nafcillin IV 2 g (08/08/19 [id number])     LOS: 7 days    Time spent: 25 minutes    Dorcas CarrowKuber Filimon Miranda, MD [facility] Pager [phone]  If 7PM-7AM, please contact night-coverage [email or web address] Password [email or web address] 08/08/2019, 11:03 AM

## 2019-08-08 NOTE — Consult Note (Addendum)
[facility] Nurse wound follow up for left [place] joint NPWT dressing change Wound type: Infectious Measurement: 8cm x  3.5cm x 4cm with deepest depth at 9 o'clock, 2cm tract Wound bed:red, dry Drainage (amount, consistency, odor) purulent material in area of deepest depth. Dry rest of wound (80%) Periwound: warm, intact Dressing procedure/placement/frequency: Medical adhesive remover used to loosen drape. One piece of black foam removed from wound bed, wound cleansed with NS. One piece of black foam used to fill defect, covered with drape. Attached to 184mHg continuous negative pressure and an immediate seal is achieved. Patient was premedicated and tolerated procedure well.  VAC dressing kit is requested for Friday, also a cannister, as a cannister change will soon be required.  Prevalon boots are provided along with conservative care guidance for Nursing using betadine swabsticks to the purple discolored medial heels.   WChesternursing team will follow, and will remain available to this patient, the nursing and medical teams.   Thanks, LMaudie Flakes MSN, RN, [place] CArther Abbott Pager# [phone]

## 2019-08-08 NOTE — Progress Notes (Signed)
Patient's girlfriend (Ala) called and requested if patient can have a Retail banker or chaplain scheduled to come talk to patient. She sated patient had called her and said he was craving bad for heroine and she believes the conversation between boyfriend and patient yielded good outcome and so requested if patient can have it scheduled. Reassured her that RN will follow up or pass it on if nothing is done tonight.

## 2019-08-08 NOTE — Progress Notes (Addendum)
      [address]       [place] [address]             [phone]      7 Days Post-Op Procedure(s) (LRB): I&D Left Chest wall abscess with application of wound vac (Left) TRANSESOPHAGEAL ECHOCARDIOGRAM (TEE) (N/A)   Subjective:  Patient states the adjustment to his pain medication was a big help.  He continues to complain of back pain.  Objective: Vital signs in last 24 hours: Temp:  [98.1 F (36.7 C)-98.8 F (37.1 C)] 98.1 F (36.7 C) (09/09 0810) Pulse Rate:  [87-94] 88 (09/09 0810) Resp:  [16-18] 18 (09/09 0810) BP: (137-150)/(78-86) 149/86 (09/09 0810) SpO2:  [99 %-100 %] 100 % (09/09 0810)  Intake/Output from previous day: 09/08 0701 - 09/09 0700 In: 1820 [P.O.:920; IV Piggyback:900] Out: 50 [Drains:50] Intake/Output this shift: Total I/O In: 320 [P.O.:320] Out: -   General appearance: alert, cooperative and no distress Heart: regular rate and rhythm Lungs: clear to auscultation bilaterally Wound: wound vac in place, no surrounding erythema, swelling present  Lab Results: Recent Labs    08/06/19 0738  WBC 11.6*  HGB 8.4*  HCT 24.2*  PLT 355   BMET:  Recent Labs    08/06/19 0738  NA 133*  K 3.3*  CL 93*  CO2 28  GLUCOSE 196*  BUN 21*  CREATININE 1.05  CALCIUM 7.4*    PT/INR: No results for input(s): LABPROT, INR in the last 72 hours. ABG    Component Value Date/Time   PHART 7.482 (H) 08/01/2019 0909   HCO3 31.0 (H) 08/01/2019 0909   TCO2 32 08/01/2019 0909   O2SAT 97.0 08/01/2019 0909   CBG (last 3)  Recent Labs    08/07/19 1728 08/07/19 2112 08/08/19 0607  GLUCAP 196* 123* 122*    Assessment/Plan: S/P Procedure(s) (LRB): I&D Left Chest wall abscess with application of wound vac (Left) TRANSESOPHAGEAL ECHOCARDIOGRAM (TEE) (N/A)  1. S/P Debridement [place] Joint abscess... wound vac change today, would like to evaluate wound, when wound care changes dressing 2. ID- patient afebrile, on ABX per ID recommendations  3. Dispo- patient stable, continues to complain of back pain, wound vac to be changed today, care per primary .   LOS: 7 days    Erin Barrett 08/08/2019 \\Due  to purulentmaterial at wound base will do next VAC change in OR for surgical debridement patient examined and medical record reviewed,agree with above note. Tharon Aquas Trigt III 08/08/2019

## 2019-08-09 ENCOUNTER — Inpatient Hospital Stay (HOSPITAL_COMMUNITY): Payer: BC Managed Care – PPO

## 2019-08-09 DIAGNOSIS — A419 Sepsis, unspecified organism: Secondary | ICD-10-CM | POA: Diagnosis not present

## 2019-08-09 DIAGNOSIS — R6520 Severe sepsis without septic shock: Secondary | ICD-10-CM | POA: Diagnosis not present

## 2019-08-09 DIAGNOSIS — M25511 Pain in right shoulder: Secondary | ICD-10-CM | POA: Diagnosis not present

## 2019-08-09 DIAGNOSIS — N179 Acute kidney failure, unspecified: Secondary | ICD-10-CM | POA: Diagnosis not present

## 2019-08-09 LAB — URINALYSIS, ROUTINE W REFLEX MICROSCOPIC
Bilirubin Urine: NEGATIVE
Glucose, UA: 50 mg/dL — AB
Ketones, ur: NEGATIVE mg/dL
Leukocytes,Ua: NEGATIVE
Nitrite: NEGATIVE
Protein, ur: 30 mg/dL — AB
Specific Gravity, Urine: 1.029 (ref 1.005–1.030)
pH: 5 (ref 5.0–8.0)

## 2019-08-09 LAB — GLUCOSE, CAPILLARY
Glucose-Capillary: 104 mg/dL — ABNORMAL HIGH (ref 70–99)
Glucose-Capillary: 156 mg/dL — ABNORMAL HIGH (ref 70–99)
Glucose-Capillary: 133 mg/dL — ABNORMAL HIGH (ref 70–99)
Glucose-Capillary: 89 mg/dL (ref 70–99)

## 2019-08-09 LAB — PREPARE RBC (CROSSMATCH)

## 2019-08-09 MED ORDER — IOHEXOL 300 MG/ML  SOLN
75.0000 mL | Freq: Once | INTRAMUSCULAR | Status: AC | PRN
  Administered 2019-08-09: 75 mL via INTRAVENOUS

## 2019-08-09 MED ORDER — ALPRAZOLAM 0.25 MG PO TABS
0.2500 mg | ORAL_TABLET | Freq: Once | ORAL | Status: AC
  Administered 2019-08-09: 0.25 mg via ORAL
  Filled 2019-08-09: qty 1

## 2019-08-09 MED ORDER — INSULIN ASPART 100 UNIT/ML ~~LOC~~ SOLN
5.0000 [IU] | Freq: Three times a day (TID) | SUBCUTANEOUS | Status: DC
  Administered 2019-08-09 – 2019-08-27 (×43): 5 [IU] via SUBCUTANEOUS

## 2019-08-09 MED ORDER — INSULIN GLARGINE 100 UNIT/ML ~~LOC~~ SOLN
20.0000 [IU] | Freq: Every day | SUBCUTANEOUS | Status: DC
  Administered 2019-08-09 – 2019-08-28 (×18): 20 [IU] via SUBCUTANEOUS
  Filled 2019-08-09 (×22): qty 0.2

## 2019-08-09 MED ORDER — CEFAZOLIN SODIUM-DEXTROSE 2-4 GM/100ML-% IV SOLN
2.0000 g | INTRAVENOUS | Status: DC
  Filled 2019-08-09: qty 100

## 2019-08-09 NOTE — Progress Notes (Signed)
8 Days Post-Op Procedure(s) (LRB): I&D Left Chest wall abscess with application of wound vac (Left) TRANSESOPHAGEAL ECHOCARDIOGRAM (TEE) (N/A) Subjective:  Awake and alert, pain in the left shoulder and [place] wound is controlled. His primary complaint is pain and limited ROM in his right shoulder.   Objective: Vital signs in last 24 hours: Temp:  [98.3 F (36.8 C)-100.9 F (38.3 C)] 98.3 F (36.8 C) (09/10 0730) Pulse Rate:  [81-102] 95 (09/10 0730) Resp:  [16-18] 18 (09/10 0730) BP: (135-147)/(77-87) 136/77 (09/10 0730) SpO2:  [99 %-100 %] 100 % (09/10 0730) Weight:  [78.6 kg] 78.6 kg (09/10 0500)     Intake/Output from previous day: 09/09 0701 - 09/10 0700 In: 1340 [P.O.:740; IV Piggyback:600] Out: 875 [Urine:850; Drains:25] Intake/Output this shift: No intake/output data recorded.  General appearance: alert, cooperative and moderate distress Extremities: ROM in the right shoulder is severely limited due to pain. No obvious erythema or fluctuance over the joint.  the erythema over the right [place] joint has cleared since I last saw him 6 days ago.   Wound: The vac dressing is in place in the open wound over the left [place] joint and is functioning appropriately   Lab Results: Recent Labs    08/08/19 1038  WBC 11.1*  HGB 8.6*  HCT 25.8*  PLT 431*   BMET:  Recent Labs    08/08/19 1038  NA 135  K 3.3*  CL 96*  CO2 28  GLUCOSE 260*  BUN 17  CREATININE 1.00  CALCIUM 7.8*    PT/INR: No results for input(s): LABPROT, INR in the last 72 hours. ABG    Component Value Date/Time   PHART 7.482 (H) 08/01/2019 0909   HCO3 31.0 (H) 08/01/2019 0909   TCO2 32 08/01/2019 0909   O2SAT 97.0 08/01/2019 0909   CBG (last 3)  Recent Labs    08/08/19 1555 08/08/19 2056 08/09/19 0614  GLUCAP 121* 207* 104*    Assessment/Plan: S/P Procedure(s) (LRB): I&D Left Chest wall abscess with application of wound vac (Left) TRANSESOPHAGEAL ECHOCARDIOGRAM (TEE) (N/A)  -POD-8 incision  and drainage of the left  joint abscess and subsequent wound vac placement. Purulence noted at the base of the wound at last dressing change. Plan exploration and possible further debridement in the OR tomorrow. ABX per ID.   -Right shoulder pain and limited ROM-Sx's probably warrant evaluation by ortho. Will defer to primary team.    LOS: 8 days    Antony Odea, PA-C [phone] 08/09/2019

## 2019-08-09 NOTE — Progress Notes (Signed)
8 Days Post-Op Procedure(s) (LRB): I&D Left Chest wall abscess with application of wound vac (Left) TRANSESOPHAGEAL ECHOCARDIOGRAM (TEE) (N/A) Subjective: Left chest wall abscess with MSSA has been treated with wound VAC for a week.  There is still excessive purulent material draining.  On the last wound VAC sponge change the bed of the wound had purulent necrotic material.  For that reason I recommended the patient return to the OR for further debridement and direct irrigation with wound VAC change. He understands the risks of bleeding.  CT scan of the right shoulder performed today shows evidence of septic bursitis but no abscess.  Orthopedic consult pending. Objective: Vital signs in last 24 hours: Temp:  [98.3 F (36.8 C)-100.9 F (38.3 C)] (P) 98.5 F (36.9 C) (09/10 1655) Pulse Rate:  [81-98] (P) 90 (09/10 1655) Resp:  [16-18] (P) 18 (09/10 1655) BP: (135-140)/(77-89) (P) 127/78 (09/10 1655) SpO2:  [99 %-100 %] (P) 100 % (09/10 1655) Weight:  [78.6 kg] 78.6 kg (09/10 0500)  Hemodynamic parameters for last 24 hours:  Stable  Intake/Output from previous day: 09/09 0701 - 09/10 0700 In: 1340 [P.O.:740; IV Piggyback:600] Out: 875 [Urine:850; Drains:25] Intake/Output this shift: Total I/O In: 1020 [P.O.:720; IV Piggyback:300] Out: 900 [Urine:900]  Wound VAC draining purulent material sponges contracted  Lab Results: Recent Labs    08/08/19 1038  WBC 11.1*  HGB 8.6*  HCT 25.8*  PLT 431*   BMET:  Recent Labs    08/08/19 1038  NA 135  K 3.3*  CL 96*  CO2 28  GLUCOSE 260*  BUN 17  CREATININE 1.00  CALCIUM 7.8*    PT/INR: No results for input(s): LABPROT, INR in the last 72 hours. ABG    Component Value Date/Time   PHART 7.482 (H) 08/01/2019 0909   HCO3 31.0 (H) 08/01/2019 0909   TCO2 32 08/01/2019 0909   O2SAT 97.0 08/01/2019 0909   CBG (last 3)  Recent Labs    08/09/19 0614 08/09/19 1137 08/09/19 1654  GLUCAP 104* 156* 133*     Assessment/Plan: S/P Procedure(s) (LRB): I&D Left Chest wall abscess with application of wound vac (Left) TRANSESOPHAGEAL ECHOCARDIOGRAM (TEE) (N/A) Plan surgical debridement of left chest wall abscess and wound VAC change tomorrow.  I discussed the procedure with the patient including expected benefits and associated risks and he understands and agrees.   LOS: 8 days    William [name] 08/09/2019

## 2019-08-09 NOTE — Progress Notes (Signed)
Nutrition Follow-up   RD working remotely.  DOCUMENTATION CODES:   Not applicable  INTERVENTION:  Continue Glucerna Shake po TID, each supplement provides 220 kcal and 10 grams of protein.  Encourage adequate PO intake.   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 Pt with sepsis present on admission with MSSA bacteremia due to IV drug use. Septic pulmonary emboli, Left chest wall abscess, Right heel abscess, Extensive right posterolateral epidural abscess L2-L3, Osteomyelitis of the L5-S1, Posterior epidural abscess L5-S1 S2. Status post incision and drainage and debridement left chest wall on wound VAC 9/3. S/p R heel I&D 9/4.   Meal completion has been 100%. Pt currently has Glucerna shake ordered and has been consuming them. RD to continue with current orders to aid in increase caloric and protein needs as well as in wound healing. Pt encouraged to eat his food at meals and to drink his supplements.   Labs and medications reviewed.   Diet Order:   Diet Order            Diet Carb Modified Fluid consistency: Thin; Room service appropriate? Yes with Assist  Diet effective now              EDUCATION NEEDS:   Education needs have been addressed  Skin:  Skin Assessment: Skin Integrity Issues: Skin Integrity Issues:: Wound VAC, Incisions Wound Vac: L chest Incisions: L chest, R ankle  Last BM:  9/10  Height:   Ht Readings from Last 1 Encounters:  08/01/19 _0  (1.88  m)    Weight:   Wt Readings from Last 1 Encounters:  08/09/19 78.6 kg    Ideal Body Weight:  86.36 kg  BMI:  Body mass index is 22.25 kg/m.  Estimated Nutritional Needs:   Kcal:  2150-2350  Protein:  105-120 grams  Fluid:  >/= 2.1 L/day    Corrin Parker, MS, RD, LDN Pager # [phone] After hours/ weekend pager # [phone]

## 2019-08-09 NOTE — Progress Notes (Signed)
PROGRESS NOTE    William [name]  XNA:[id number] DOB: 02/14/1981 DOA: 07/31/2019 PCP: Patient, No Pcp Per    Brief Narrative:  38 year old no previous medical history, heroin injection for last 10 years presented to emergency room on 9/1 with multiple complaints including lower back pain, left shoulder pain, right foot and heel pain.  In the emergency room, he has right heel abscess, left upper chest wall abscess and mass, CT scan noted left anterior chest wall soft tissue and intramuscular infection with extension into the thoracic cavity, air in the soft tissues of the right axilla adjacent to the right shoulder, multiple small pulmonary nodules.  Patient was admitted to ICU.  Underwent surgical debridement of the chest wall abscess and wound VAC placement.  Also found to have new diagnosis of diabetes.  MRI showed multiple epidural and paraspinal abscesses. Blood cultures 9/2: MSSA Blood cultures 9/3: Pending, no growth so far. Wound culture 9/2: MSSA. Right heel culture 9/3: Abundant WBC.  MSSA. Repeat c/s : 9/3,9/6 negative so far.    Assessment & Plan:   Principal Problem:   Staphylococcus aureus bacteremia Active Problems:   Sepsis (HCC)   Abscess of paraspinous muscles   Osteomyelitis of lumbar spine (HCC)   Epidural abscess of spine due to infective embolism   Abnormal chest CT   Septic embolism (HCC)   Community acquired pneumonia   IV drug abuse (HCC)   Heroin abuse (HCC)   Elevated troponin   UTI (urinary tract infection)   Renal failure   Hyperglycemia   Suspected endocarditis   Chest wall abscess   Abscess of right heel   Acute osteomyelitis of lumbar spine (HCC)  Sepsis present on admission with MSSA bacteremia due to IV drug use: Septic pulmonary emboli, Left chest wall abscess, Right heel abscess, Extensive right posterolateral epidural abscess L2-L3, Osteomyelitis of the L5-S1, Posterior epidural abscess L5-S1 S2. - Status post incision and drainage and  debridement left chest wall on wound VAC, followed by CT CS. Grossly purulent continuous drain.  Patient going to operating room tomorrow for dressing and possible local debridement. - Bedside I&D right heel, MSSA. - Multiple epidural abscess and discitis, seen by neurosurgery recommended no surgical drainage but antibiotics. - followed by infectious disease.  He was on cefazolin and metronidazole.  Now remains on nafcillin.  Repeat cultures were negative. - Intraoperative TEE with no evidence of endocarditis. - Anticipate intravenous antibiotics for about 6 to 8 weeks.  Patient has exhausted IV access.  PICC line given. -08/09/2019: Pain in the stiffness of the right shoulder.  No obvious abscess or swelling.  CT scan with contrast ordered.  We will follow-up and consult orthopedics.  Left heel swelling improving with no evidence of abscess.  IV drug use: Ongoing opiate use.  Patient is motivated to quit.  Will provide symptomatic treatment.  Opiates for pain relief for postoperative pain.  Will use Tylenol and muscle relaxants. Used metoprolol for tachycardia. Patient to use Xanax for craving and anxiety.  New onset diabetes: A1c 7.8.  Started on insulin.  Stabilizing on increased dose of insulin.  Today, will increase dose of Levemir to 20 units and start patient on prandial insulin.    DVT prophylaxis: Heparin subcu Code Status: Full code Family Communication: None Disposition Plan: Inpatient   Consultants:   PCCM, transfer out  CT CS, following  Neurosurgery, signed off  Orthopedics, signed off  Procedures:   Chest wall abscess incision and drainage, 08/02/2019  Right heel incision  and drainage, 08/03/2019  Antimicrobials:   Vancomycin, 08/01/2019>>> 08/02/2019  Cefepime, 08/01/2019>>> 08/02/2019  Cefazolin, 08/02/2019>>>>> 08/06/2019  Flagyl, 08/02/2019>>> 08/06/2019  Diflucan, on 08/03/2019 dose  Nystatin swish and swallow, 08/03/2019 >>>  Nafcillin 08/06/2019>>>>   Subjective:  Patient seen and examined.  No more heel pain or knee pain.  Today he is right shoulder is stiff and difficult to move.  Temperature maximum 100.9. Patient is very anxious and wants to talk to some counselor or chaplain.  Had 3 episodes of diarrhea last night.  Per previous constipation he was on MiraLAX.  Objective: Vitals:   08/08/19 2356 08/09/19 0500 08/09/19 0730 08/09/19 1141  BP: 140/87  136/77 135/89  Pulse: 81  95 97  Resp: 16  18 18   Temp: 98.5 F (36.9 C)  98.3 F (36.8 C) 99.9 F (37.7 C)  TempSrc: Oral  Oral Oral  SpO2: 99%  100% 100%  Weight:  78.6 kg    Height:        Intake/Output Summary (Last 24 hours) at 08/09/2019 1251 Last data filed at 08/09/2019 0900 Gross per 24 hour  Intake 1380 ml  Output 875 ml  Net 505 ml   Filed Weights   08/04/19 0500 08/05/19 0500 08/09/19 0500  Weight: 84 kg 84.3 kg 78.6 kg    Examination:  General exam: Comfortable. Without any distress.  Afebrile.  On room air.  Anxious today. Respiratory system: Clear to auscultation. Respiratory effort normal. Cardiovascular system: S1 & S2 heard, RRR. No JVD, murmurs, rubs, gallops or clicks. No pedal edema. Left anterior chest wall wound with wound VAC, draining pus. Gastrointestinal system: Abdomen is nondistended, soft and nontender. No organomegaly or masses felt. Normal bowel sounds heard. Central nervous system: Alert and oriented. No focal neurological deficits. Extremities: Symmetric 5 x 5 power.  No sensory deficits. Skin: No rashes, lesions.  Right heel incision site clean and dry, no drainage.  Left medial heel, with some tenderness, no underlying collection or fluctuation. Right shoulder joint stiff, no effusion palpable.  Tender to movement. Bilateral knee and other joints with no effusion, no redness or erythema. Psychiatry: Judgement and insight appear normal. Mood & affect anxious.    Data Reviewed: I have personally reviewed following labs and imaging studies  CBC:  Recent Labs  Lab 08/04/19 0352 08/06/19 0738 08/08/19 1038  WBC 12.1* 11.6* 11.1*  NEUTROABS 9.9* 9.8* 9.6*  HGB 9.7* 8.4* 8.6*  HCT 28.4* 24.2* 25.8*  MCV 88.5 88.3 91.2  PLT 450* 355 431*   Basic Metabolic Panel: Recent Labs  Lab 08/04/19 0352 08/06/19 0738 08/08/19 1038  NA 134* 133* 135  K 3.0* 3.3* 3.3*  CL 95* 93* 96*  CO2 27 28 28   GLUCOSE 190* 196* 260*  BUN 28* 21* 17  CREATININE 1.24 1.05 1.00  CALCIUM 8.0* 7.4* 7.8*  MG 1.9 1.7 1.9  PHOS 3.9 2.7 2.9   GFR: Estimated Creatinine Clearance: 111.4 mL/min (by C-G formula based on SCr of 1 mg/dL). Liver Function Tests: Recent Labs  Lab 08/06/19 0738  AST 20  ALT 15  ALKPHOS 96  BILITOT 0.9  PROT 5.5*  ALBUMIN 1.3*   No results for input(s): LIPASE, AMYLASE in the last 168 hours. No results for input(s): AMMONIA in the last 168 hours. Coagulation Profile: No results for input(s): INR, PROTIME in the last 168 hours. Cardiac Enzymes: No results for input(s): CKTOTAL, CKMB, CKMBINDEX, TROPONINI in the last 168 hours. BNP (last 3 results) No results for input(s): PROBNP in  the last 8760 hours. HbA1C: No results for input(s): HGBA1C in the last 72 hours. CBG: Recent Labs  Lab 08/08/19 1230 08/08/19 1555 08/08/19 2056 08/09/19 0614 08/09/19 1137  GLUCAP 233* 121* 207* 104* 156*   Lipid Profile: No results for input(s): CHOL, HDL, LDLCALC, TRIG, CHOLHDL, LDLDIRECT in the last 72 hours. Thyroid Function Tests: No results for input(s): TSH, T4TOTAL, FREET4, T3FREE, THYROIDAB in the last 72 hours. Anemia Panel: No results for input(s): VITAMINB12, FOLATE, FERRITIN, TIBC, IRON, RETICCTPCT in the last 72 hours. Sepsis Labs: No results for input(s): PROCALCITON, LATICACIDVEN in the last 168 hours.  Recent Results (from the past 240 hour(s))  Urine culture     Status: Abnormal   Collection Time: 07/31/19 10:58 PM   Specimen: In/Out Cath Urine  Result Value Ref Range Status   Specimen Description  IN/OUT CATH URINE  Final   Special Requests   Final    NONE Performed at [facility], [address] [place], [place] [address]    Culture >=100,000 COLONIES/mL STAPHYLOCOCCUS AUREUS (A)  Final   Report Status 08/02/2019 FINAL  Final   Organism ID, Bacteria STAPHYLOCOCCUS AUREUS (A)  Final      Susceptibility   Staphylococcus aureus - MIC*    CIPROFLOXACIN 1 SENSITIVE Sensitive     GENTAMICIN <=0.5 SENSITIVE Sensitive     NITROFURANTOIN <=16 SENSITIVE Sensitive     OXACILLIN 0.5 SENSITIVE Sensitive     TETRACYCLINE <=1 SENSITIVE Sensitive     VANCOMYCIN <=0.5 SENSITIVE Sensitive     TRIMETH/SULFA <=10 SENSITIVE Sensitive     CLINDAMYCIN <=0.25 SENSITIVE Sensitive     RIFAMPIN <=0.5 SENSITIVE Sensitive     Inducible Clindamycin NEGATIVE Sensitive     * >=100,000 COLONIES/mL STAPHYLOCOCCUS AUREUS  Blood culture (routine x 2)     Status: Abnormal   Collection Time: 08/01/19  1:36 AM   Specimen: BLOOD  Result Value Ref Range Status   Specimen Description BLOOD RIGHT ANTECUBITAL  Final   Special Requests   Final    BOTTLES DRAWN AEROBIC AND ANAEROBIC Blood Culture adequate volume   Culture  Setup Time   Final    GRAM POSITIVE COCCI IN BOTH AEROBIC AND ANAEROBIC BOTTLES CRITICAL RESULT CALLED TO, READ BACK BY AND VERIFIED WITH: Ihor AustinJ. Millen PharmD 17:20 08/01/19 ([email or web address]) Performed at [facility], [address] [place], [place] [address]    Culture STAPHYLOCOCCUS AUREUS (A)  Final   Report Status 08/03/2019 FINAL  Final   Organism ID, Bacteria STAPHYLOCOCCUS AUREUS  Final      Susceptibility   Staphylococcus aureus - MIC*    CIPROFLOXACIN <=0.5 SENSITIVE Sensitive     ERYTHROMYCIN <=0.25 SENSITIVE Sensitive     GENTAMICIN <=0.5 SENSITIVE Sensitive     OXACILLIN <=0.25 SENSITIVE Sensitive     TETRACYCLINE <=1 SENSITIVE Sensitive     VANCOMYCIN 1 SENSITIVE Sensitive     TRIMETH/SULFA <=10 SENSITIVE Sensitive     CLINDAMYCIN <=0.25 SENSITIVE Sensitive     RIFAMPIN  <=0.5 SENSITIVE Sensitive     Inducible Clindamycin NEGATIVE Sensitive     * STAPHYLOCOCCUS AUREUS  Blood Culture ID Panel (Reflexed)     Status: Abnormal   Collection Time: 08/01/19  1:36 AM  Result Value Ref Range Status   Enterococcus species NOT DETECTED NOT DETECTED Final   Listeria monocytogenes NOT DETECTED NOT DETECTED Final   Staphylococcus species DETECTED (A) NOT DETECTED Final    Comment: CRITICAL RESULT CALLED TO, READ BACK BY AND  VERIFIED WITH: Ihor AustinJ. Millen PharmD 17:20 08/01/19 ([email or web address])    Staphylococcus aureus (BCID) DETECTED (A) NOT DETECTED Final    Comment: Methicillin (oxacillin) susceptible Staphylococcus aureus (MSSA). Preferred therapy is anti staphylococcal beta lactam antibiotic (Cefazolin or Nafcillin), unless clinically contraindicated. CRITICAL RESULT CALLED TO, READ BACK BY AND VERIFIED WITH: Ihor AustinJ. Millen PharmD 17:20 08/01/19 ([email or web address])    Methicillin resistance NOT DETECTED NOT DETECTED Final   Streptococcus species NOT DETECTED NOT DETECTED Final   Streptococcus agalactiae NOT DETECTED NOT DETECTED Final   Streptococcus pneumoniae NOT DETECTED NOT DETECTED Final   Streptococcus pyogenes NOT DETECTED NOT DETECTED Final   Acinetobacter baumannii NOT DETECTED NOT DETECTED Final   Enterobacteriaceae species NOT DETECTED NOT DETECTED Final   Enterobacter cloacae complex NOT DETECTED NOT DETECTED Final   Escherichia coli NOT DETECTED NOT DETECTED Final   Klebsiella oxytoca NOT DETECTED NOT DETECTED Final   Klebsiella pneumoniae NOT DETECTED NOT DETECTED Final   Proteus species NOT DETECTED NOT DETECTED Final   Serratia marcescens NOT DETECTED NOT DETECTED Final   Haemophilus influenzae NOT DETECTED NOT DETECTED Final   Neisseria meningitidis NOT DETECTED NOT DETECTED Final   Pseudomonas aeruginosa NOT DETECTED NOT DETECTED Final   Candida albicans NOT DETECTED NOT DETECTED Final   Candida glabrata NOT DETECTED NOT DETECTED Final   Candida krusei NOT DETECTED NOT  DETECTED Final   Candida parapsilosis NOT DETECTED NOT DETECTED Final   Candida tropicalis NOT DETECTED NOT DETECTED Final    Comment: Performed at [facility], [address] [place], [place] [address]  SARS Coronavirus 2 [facility](Hospital order, Performed in [facility] hospital lab) Nasopharyngeal Abscess     Status: None   Collection Time: 08/01/19  2:10 AM   Specimen: Abscess; Nasopharyngeal  Result Value Ref Range Status   SARS Coronavirus 2 NEGATIVE NEGATIVE Final    Comment: (NOTE) If result is NEGATIVE SARS-CoV-2 target nucleic acids are NOT DETECTED. The SARS-CoV-2 RNA is generally detectable in upper and lower  respiratory specimens during the acute phase of infection. The lowest  concentration of SARS-CoV-2 viral copies this assay can detect is 250  copies / mL. A negative result does not preclude SARS-CoV-2 infection  and should not be used as the sole basis for treatment or other  patient management decisions.  A negative result may occur with  improper specimen collection / handling, submission of specimen other  than nasopharyngeal swab, presence of viral mutation(s) within the  areas targeted by this assay, and inadequate number of viral copies  (<250 copies / mL). A negative result must be combined with clinical  observations, patient history, and epidemiological information. If result is POSITIVE SARS-CoV-2 target nucleic acids are DETECTED. The SARS-CoV-2 RNA is generally detectable in upper and lower  respiratory specimens dur ing the acute phase of infection.  Positive  results are indicative of active infection with SARS-CoV-2.  Clinical  correlation with patient history and other diagnostic information is  necessary to determine patient infection status.  Positive results do  not rule out bacterial infection or co-infection with other viruses. If result is PRESUMPTIVE POSTIVE SARS-CoV-2 nucleic acids MAY BE PRESENT.   A presumptive positive result was  obtained on the submitted specimen  and confirmed on repeat testing.  While 2019 novel coronavirus  (SARS-CoV-2) nucleic acids may be present in the submitted sample  additional confirmatory testing may be necessary for epidemiological  and / or clinical management purposes  to differentiate between  SARS-CoV-2 and other Sarbecovirus currently  known to infect humans.  If clinically indicated additional testing with an alternate test  methodology [phone]) is advised. The SARS-CoV-2 RNA is generally  detectable in upper and lower respiratory sp ecimens during the acute  phase of infection. The expected result is Negative. Fact Sheet for Patients:  BoilerBrush.com.cy Fact Sheet for Healthcare Providers: https://pope.com/ This test is not yet approved or cleared by the [place] FDA and has been authorized for detection and/or diagnosis of SARS-CoV-2 by FDA under an Emergency Use Authorization (EUA).  This EUA will remain in effect (meaning this test can be used) for the duration of the COVID-19 declaration under Section 564(b)(1) of the Act, 21 U.S.C. section 360bbb-3(b)(1), unless the authorization is terminated or revoked sooner. Performed at [facility], [address], [place], [place] [address]   Wound or Superficial Culture     Status: None   Collection Time: 08/01/19  2:10 AM   Specimen: Abscess; Wound  Result Value Ref Range Status   Specimen Description ABSCESS  Final   Special Requests NONE  Final   Gram Stain   Final    RARE WBC PRESENT, PREDOMINANTLY PMN MODERATE GRAM POSITIVE COCCI IN CLUSTERS Performed at [facility], [address], [place], [place] [address]    Culture ABUNDANT STAPHYLOCOCCUS AUREUS  Final   Report Status 08/03/2019 FINAL  Final   Organism ID, Bacteria STAPHYLOCOCCUS AUREUS  Final      Susceptibility   Staphylococcus aureus - MIC*    CIPROFLOXACIN <=0.5 SENSITIVE Sensitive      ERYTHROMYCIN <=0.25 SENSITIVE Sensitive     GENTAMICIN <=0.5 SENSITIVE Sensitive     OXACILLIN <=0.25 SENSITIVE Sensitive     TETRACYCLINE <=1 SENSITIVE Sensitive     VANCOMYCIN <=0.5 SENSITIVE Sensitive     TRIMETH/SULFA <=10 SENSITIVE Sensitive     CLINDAMYCIN <=0.25 SENSITIVE Sensitive     RIFAMPIN <=0.5 SENSITIVE Sensitive     Inducible Clindamycin NEGATIVE Sensitive     * ABUNDANT STAPHYLOCOCCUS AUREUS  Blood culture (routine x 2)     Status: Abnormal   Collection Time: 08/01/19  2:40 AM   Specimen: BLOOD LEFT ARM  Result Value Ref Range Status   Specimen Description BLOOD LEFT ARM  Final   Special Requests   Final    BOTTLES DRAWN AEROBIC AND ANAEROBIC Blood Culture adequate volume   Culture  Setup Time   Final    GRAM POSITIVE COCCI IN BOTH AEROBIC AND ANAEROBIC BOTTLES CRITICAL VALUE NOTED.  VALUE IS CONSISTENT WITH PREVIOUSLY REPORTED AND CALLED VALUE.    Culture (A)  Final    STAPHYLOCOCCUS AUREUS SUSCEPTIBILITIES PERFORMED ON PREVIOUS CULTURE WITHIN THE LAST 5 DAYS. Performed at [facility], [address] [place], [place] [address]    Report Status 08/03/2019 FINAL  Final  MRSA PCR Screening     Status: None   Collection Time: 08/01/19  7:28 AM   Specimen: Nasal Mucosa; Nasopharyngeal  Result Value Ref Range Status   MRSA by PCR NEGATIVE NEGATIVE Final    Comment:        The GeneXpert MRSA Assay (FDA approved for NASAL specimens only), is one component of a comprehensive MRSA colonization surveillance program. It is not intended to diagnose MRSA infection nor to guide or monitor treatment for MRSA infections. Performed at [facility], [address], [place], [place] [address]   Aerobic/Anaerobic Culture (surgical/deep wound)     Status: None   Collection Time: 08/01/19  1:38  PM   Specimen: Abscess  Result Value Ref Range Status   Specimen Description ABSCESS LEFT CHEST WALL  Final   Special Requests PATIENT ON FOLLOWING MAXIPINE ANCEF   Final   Gram Stain   Final    ABUNDANT WBC PRESENT, PREDOMINANTLY PMN ABUNDANT GRAM POSITIVE COCCI    Culture   Final    ABUNDANT STAPHYLOCOCCUS AUREUS NO ANAEROBES ISOLATED Performed at [name], [place] [address] [place], [place] [address]    Report Status 08/06/2019 FINAL  Final   Organism ID, Bacteria STAPHYLOCOCCUS AUREUS  Final      Susceptibility   Staphylococcus aureus - MIC*    CIPROFLOXACIN <=0.5 SENSITIVE Sensitive     ERYTHROMYCIN <=0.25 SENSITIVE Sensitive     GENTAMICIN <=0.5 SENSITIVE Sensitive     OXACILLIN <=0.25 SENSITIVE Sensitive     TETRACYCLINE <=1 SENSITIVE Sensitive     VANCOMYCIN 1 SENSITIVE Sensitive     TRIMETH/SULFA <=10 SENSITIVE Sensitive     CLINDAMYCIN <=0.25 SENSITIVE Sensitive     RIFAMPIN <=0.5 SENSITIVE Sensitive     Inducible Clindamycin NEGATIVE Sensitive     * ABUNDANT STAPHYLOCOCCUS AUREUS  Culture, blood (routine x 2)     Status: None   Collection Time: 08/02/19 10:26 AM   Specimen: BLOOD RIGHT ARM  Result Value Ref Range Status   Specimen Description BLOOD RIGHT ARM  Final   Special Requests   Final    BOTTLES DRAWN AEROBIC AND ANAEROBIC Blood Culture adequate volume   Culture   Final    NO GROWTH 5 DAYS Performed at [facility], [place] [address], [place], [place] [address]    Report Status 08/07/2019 FINAL  Final  Culture, blood (routine x 2)     Status: None   Collection Time: 08/02/19 10:26 AM   Specimen: BLOOD LEFT ARM  Result Value Ref Range Status   Specimen Description BLOOD LEFT ARM  Final   Special Requests   Final    BOTTLES DRAWN AEROBIC ONLY Blood Culture adequate volume   Culture   Final    NO GROWTH 5 DAYS Performed at [facility], [place] [address] [place], [place] [address]    Report Status 08/07/2019 FINAL  Final  Aerobic Culture (superficial specimen)     Status: None   Collection Time: 08/03/19 11:24 AM   Specimen: Heel; Wound  Result Value Ref Range Status   Specimen Description HEEL   Final   Special Requests Normal  Final   Gram Stain   Final    ABUNDANT WBC PRESENT, PREDOMINANTLY PMN ABUNDANT GRAM POSITIVE COCCI Performed at [facility], [place] [address], [place], [place] [address]    Culture ABUNDANT STAPHYLOCOCCUS AUREUS  Final   Report Status 08/05/2019 FINAL  Final   Organism ID, Bacteria STAPHYLOCOCCUS AUREUS  Final      Susceptibility   Staphylococcus aureus - MIC*    CIPROFLOXACIN <=0.5 SENSITIVE Sensitive     ERYTHROMYCIN <=0.25 SENSITIVE Sensitive     GENTAMICIN <=0.5 SENSITIVE Sensitive     OXACILLIN <=0.25 SENSITIVE Sensitive     TETRACYCLINE <=1 SENSITIVE Sensitive     VANCOMYCIN 1 SENSITIVE Sensitive     TRIMETH/SULFA <=10 SENSITIVE Sensitive     CLINDAMYCIN <=0.25 SENSITIVE Sensitive     RIFAMPIN <=0.5 SENSITIVE Sensitive     Inducible Clindamycin NEGATIVE Sensitive     * ABUNDANT STAPHYLOCOCCUS AUREUS  Culture, blood (routine x 2)     Status: None (Preliminary result)  Collection Time: 08/05/19 11:03 AM   Specimen: BLOOD RIGHT HAND  Result Value Ref Range Status   Specimen Description BLOOD RIGHT HAND  Final   Special Requests   Final    BOTTLES DRAWN AEROBIC ONLY Blood Culture results may not be optimal due to an inadequate volume of blood received in culture bottles   Culture   Final    NO GROWTH 3 DAYS Performed at [facility], [address] [place], [place] [address]    Report Status PENDING  Incomplete  Culture, blood (routine x 2)     Status: None (Preliminary result)   Collection Time: 08/05/19 11:04 AM   Specimen: BLOOD LEFT HAND  Result Value Ref Range Status   Specimen Description BLOOD LEFT HAND  Final   Special Requests   Final    BOTTLES DRAWN AEROBIC AND ANAEROBIC Blood Culture adequate volume   Culture   Final    NO GROWTH 3 DAYS Performed at [facility], [address], [place], [place] [address]    Report Status PENDING  Incomplete  Surgical PCR screen     Status: Abnormal   Collection Time:  08/07/19  5:18 PM   Specimen: Nasal Mucosa; Nasal Swab  Result Value Ref Range Status   MRSA, PCR NEGATIVE NEGATIVE Final   Staphylococcus aureus POSITIVE (A) NEGATIVE Final    Comment: (NOTE) The Xpert SA Assay (FDA approved for NASAL specimens in patients 62 years of age and older), is one component of a comprehensive surveillance program. It is not intended to diagnose infection nor to guide or monitor treatment. Performed at [facility], [address] [place], [place] [address]          Radiology Studies: No results found.      Scheduled Meds: . Chlorhexidine Gluconate Cloth  6 each Topical Daily  . docusate sodium  100 mg Oral Daily  . feeding supplement (GLUCERNA SHAKE)  237 mL Oral TID BM  . heparin  5,000 Units Subcutaneous Q8H  . insulin aspart  0-15 Units Subcutaneous TID WC  . insulin aspart  0-5 Units Subcutaneous QHS  . insulin aspart  5 Units Subcutaneous TID WC  . insulin glargine  20 Units Subcutaneous Daily  . metoprolol tartrate  12.5 mg Oral BID  . mupirocin ointment  1 application Nasal BID  . nystatin  5 mL Oral QID  . ondansetron  4 mg Intravenous Once  . polyethylene glycol  17 g Oral Daily  . senna-docusate  1 tablet Oral BID  . sodium chloride flush  10-40 mL Intracatheter Q12H   Continuous Infusions: . nafcillin IV 2 g (08/09/19 1104)     LOS: 8 days    Time spent: 30 minutes    Dorcas Carrow, MD [facility] Pager [phone]  If 7PM-7AM, please contact night-coverage [email or web address] Password [email or web address] 08/09/2019, 12:51 PM

## 2019-08-09 NOTE — Progress Notes (Signed)
Patient ID: William [name], male   DOB: [birth year], 38 y.o.   MRN: [id number]   LOS: 8 days   Subjective: Asked to see Mr. William [name] again to r/o septic shoulder bursitis. He says for the last 3-4d his right shoulder has been feeling weak. He denies pain or prior problems with the shoulder.   Objective: Vital signs in last 24 hours: Temp:  [98.3 F (36.8 C)-100.9 F (38.3 C)] 99.9 F (37.7 C) (09/10 1141) Pulse Rate:  [81-102] 97 (09/10 1141) Resp:  [16-18] 18 (09/10 1141) BP: (135-144)/(77-89) 135/89 (09/10 1141) SpO2:  [99 %-100 %] 100 % (09/10 1141) Weight:  [78.6 kg] 78.6 kg (09/10 0500) Last BM Date: 08/09/19   Laboratory  CBC Recent Labs    08/08/19 1038  WBC 11.1*  HGB 8.6*  HCT 25.8*  PLT 431*   BMET Recent Labs    08/08/19 1038  NA 135  K 3.3*  CL 96*  CO2 28  GLUCOSE 260*  BUN 17  CREATININE 1.00  CALCIUM 7.8*     Physical Exam General appearance: alert  Right shoulder, elbow, wrist, digits- no skin wounds, nontender, no instability, no blocks to motion, shoulder abduction 4/5, flex/ext 4+/5, no pain with PROM until >100 degrees  Sens  Ax/R/M/U intact  Mot   Ax/ R/ PIN/ M/ AIN/ U intact  Rad 2+    Assessment/Plan: Right shoulder weakness -- I'm not sure what's going on here but do not feel this is infectious in nature. Will order PT for strengthening and flexibility training.    Lisette Abu, PA-C Orthopedic Surgery [phone] 08/09/2019

## 2019-08-10 ENCOUNTER — Inpatient Hospital Stay (HOSPITAL_COMMUNITY): Payer: BC Managed Care – PPO

## 2019-08-10 ENCOUNTER — Inpatient Hospital Stay (HOSPITAL_COMMUNITY): Payer: BC Managed Care – PPO | Admitting: Certified Registered"

## 2019-08-10 ENCOUNTER — Encounter (HOSPITAL_COMMUNITY): Payer: Self-pay

## 2019-08-10 ENCOUNTER — Encounter (HOSPITAL_COMMUNITY)
Admission: EM | Disposition: A | Discharge status: Home / Self Care | Payer: Self-pay | Source: Home / Self Care | Attending: Internal Medicine

## 2019-08-10 DIAGNOSIS — R079 Chest pain, unspecified: Secondary | ICD-10-CM | POA: Diagnosis not present

## 2019-08-10 DIAGNOSIS — R7881 Bacteremia: Secondary | ICD-10-CM

## 2019-08-10 DIAGNOSIS — Z452 Encounter for adjustment and management of vascular access device: Secondary | ICD-10-CM | POA: Diagnosis not present

## 2019-08-10 DIAGNOSIS — Z978 Presence of other specified devices: Secondary | ICD-10-CM | POA: Diagnosis not present

## 2019-08-10 DIAGNOSIS — N390 Urinary tract infection, site not specified: Secondary | ICD-10-CM | POA: Diagnosis not present

## 2019-08-10 DIAGNOSIS — J188 Other pneumonia, unspecified organism: Secondary | ICD-10-CM | POA: Diagnosis not present

## 2019-08-10 DIAGNOSIS — M25511 Pain in right shoulder: Secondary | ICD-10-CM | POA: Diagnosis not present

## 2019-08-10 DIAGNOSIS — J984 Other disorders of lung: Secondary | ICD-10-CM | POA: Diagnosis not present

## 2019-08-10 DIAGNOSIS — M4646 Discitis, unspecified, lumbar region: Secondary | ICD-10-CM | POA: Diagnosis not present

## 2019-08-10 DIAGNOSIS — B9561 Methicillin susceptible Staphylococcus aureus infection as the cause of diseases classified elsewhere: Secondary | ICD-10-CM | POA: Diagnosis not present

## 2019-08-10 DIAGNOSIS — A4101 Sepsis due to Methicillin susceptible Staphylococcus aureus: Secondary | ICD-10-CM | POA: Diagnosis not present

## 2019-08-10 DIAGNOSIS — M4626 Osteomyelitis of vertebra, lumbar region: Secondary | ICD-10-CM | POA: Diagnosis not present

## 2019-08-10 DIAGNOSIS — E1169 Type 2 diabetes mellitus with other specified complication: Secondary | ICD-10-CM | POA: Diagnosis not present

## 2019-08-10 DIAGNOSIS — M869 Osteomyelitis, unspecified: Secondary | ICD-10-CM | POA: Diagnosis not present

## 2019-08-10 DIAGNOSIS — L02213 Cutaneous abscess of chest wall: Secondary | ICD-10-CM | POA: Diagnosis not present

## 2019-08-10 HISTORY — PX: STERNAL WOUND DEBRIDEMENT: SHX1058

## 2019-08-10 HISTORY — PX: APPLICATION OF WOUND VAC: SHX5189

## 2019-08-10 LAB — CULTURE, BLOOD (ROUTINE X 2)
Culture: NO GROWTH
Culture: NO GROWTH
Special Requests: ADEQUATE

## 2019-08-10 LAB — CBC WITH DIFFERENTIAL/PLATELET
Abs Immature Granulocytes: 0.06 10*3/uL (ref 0.00–0.07)
Basophils Absolute: 0 10*3/uL (ref 0.0–0.1)
Basophils Relative: 0 %
Eosinophils Absolute: 0 10*3/uL (ref 0.0–0.5)
Eosinophils Relative: 0 %
HCT: 23.8 % — ABNORMAL LOW (ref 39.0–52.0)
Hemoglobin: 7.9 g/dL — ABNORMAL LOW (ref 13.0–17.0)
Immature Granulocytes: 1 %
Lymphocytes Relative: 13 %
Lymphs Abs: 1.1 10*3/uL (ref 0.7–4.0)
MCH: 30.7 pg (ref 26.0–34.0)
MCHC: 33.2 g/dL (ref 30.0–36.0)
MCV: 92.6 fL (ref 80.0–100.0)
Monocytes Absolute: 0.3 10*3/uL (ref 0.1–1.0)
Monocytes Relative: 4 %
Neutro Abs: 7 10*3/uL (ref 1.7–7.7)
Neutrophils Relative %: 82 %
Platelets: 402 10*3/uL — ABNORMAL HIGH (ref 150–400)
RBC: 2.57 MIL/uL — ABNORMAL LOW (ref 4.22–5.81)
RDW: 15.4 % (ref 11.5–15.5)
WBC: 8.6 10*3/uL (ref 4.0–10.5)
nRBC: 0 % (ref 0.0–0.2)

## 2019-08-10 LAB — BASIC METABOLIC PANEL
Anion gap: 10 (ref 5–15)
BUN: 14 mg/dL (ref 6–20)
CO2: 27 mmol/L (ref 22–32)
Calcium: 7.7 mg/dL — ABNORMAL LOW (ref 8.9–10.3)
Chloride: 99 mmol/L (ref 98–111)
Creatinine, Ser: 0.86 mg/dL (ref 0.61–1.24)
GFR calc Af Amer: >60 mL/min (ref >60)
GFR calc non Af Amer: >60 mL/min (ref >60)
Glucose, Bld: 133 mg/dL — ABNORMAL HIGH (ref 70–99)
Potassium: 3 mmol/L — ABNORMAL LOW (ref 3.5–5.1)
Sodium: 136 mmol/L (ref 135–145)

## 2019-08-10 LAB — GLUCOSE, CAPILLARY
Glucose-Capillary: 108 mg/dL — ABNORMAL HIGH (ref 70–99)
Glucose-Capillary: 94 mg/dL (ref 70–99)
Glucose-Capillary: 229 mg/dL — ABNORMAL HIGH (ref 70–99)
Glucose-Capillary: 193 mg/dL — ABNORMAL HIGH (ref 70–99)

## 2019-08-10 LAB — MAGNESIUM: Magnesium: 1.9 mg/dL (ref 1.7–2.4)

## 2019-08-10 LAB — PHOSPHORUS: Phosphorus: 3.7 mg/dL (ref 2.5–4.6)

## 2019-08-10 SURGERY — DEBRIDEMENT, WOUND, STERNUM
Anesthesia: General

## 2019-08-10 MED ORDER — DEXAMETHASONE SODIUM PHOSPHATE 10 MG/ML IJ SOLN
INTRAMUSCULAR | Status: DC | PRN
  Administered 2019-08-10: 5 mg via INTRAVENOUS

## 2019-08-10 MED ORDER — ONDANSETRON HCL 4 MG/2ML IJ SOLN
INTRAMUSCULAR | Status: AC
  Filled 2019-08-10: qty 2

## 2019-08-10 MED ORDER — ACETAMINOPHEN 160 MG/5ML PO SOLN: 1000.0000 mg | Freq: Once | ORAL | Status: DC | PRN

## 2019-08-10 MED ORDER — ROCURONIUM BROMIDE 10 MG/ML (PF) SYRINGE
PREFILLED_SYRINGE | INTRAVENOUS | Status: DC | PRN
  Administered 2019-08-10: 60 mg via INTRAVENOUS

## 2019-08-10 MED ORDER — FENTANYL CITRATE (PF) 250 MCG/5ML IJ SOLN
INTRAMUSCULAR | Status: DC | PRN
  Administered 2019-08-10 (×2): 50 ug via INTRAVENOUS
  Administered 2019-08-10: 150 ug via INTRAVENOUS

## 2019-08-10 MED ORDER — 0.9 % SODIUM CHLORIDE (POUR BTL) OPTIME
TOPICAL | Status: DC | PRN
  Administered 2019-08-10: 10:00:00 2000 mL

## 2019-08-10 MED ORDER — FENTANYL CITRATE (PF) 100 MCG/2ML IJ SOLN
INTRAMUSCULAR | Status: AC
  Administered 2019-08-10: 14:00:00
  Filled 2019-08-10: qty 2

## 2019-08-10 MED ORDER — ACETAMINOPHEN 10 MG/ML IV SOLN: 1000.0000 mg | Freq: Once | INTRAVENOUS | Status: DC | PRN

## 2019-08-10 MED ORDER — PROPOFOL 10 MG/ML IV BOLUS
INTRAVENOUS | Status: DC | PRN
  Administered 2019-08-10: 140 mg via INTRAVENOUS

## 2019-08-10 MED ORDER — ALPRAZOLAM 0.25 MG PO TABS
ORAL_TABLET | ORAL | Status: AC
  Administered 2019-08-10: 0.25 mg
  Filled 2019-08-10: qty 1

## 2019-08-10 MED ORDER — LIDOCAINE 2% (20 MG/ML) 5 ML SYRINGE
INTRAMUSCULAR | Status: AC
  Filled 2019-08-10: qty 5

## 2019-08-10 MED ORDER — CEFAZOLIN SODIUM-DEXTROSE 2-4 GM/100ML-% IV SOLN
2.0000 g | INTRAVENOUS | Status: AC
  Administered 2019-08-10: 2 g via INTRAVENOUS

## 2019-08-10 MED ORDER — MIDAZOLAM HCL 5 MG/5ML IJ SOLN
INTRAMUSCULAR | Status: DC | PRN
  Administered 2019-08-10: 2 mg via INTRAVENOUS

## 2019-08-10 MED ORDER — PHENYLEPHRINE 40 MCG/ML (10ML) SYRINGE FOR IV PUSH (FOR BLOOD PRESSURE SUPPORT)
PREFILLED_SYRINGE | INTRAVENOUS | Status: DC | PRN
  Administered 2019-08-10 (×3): 80 ug via INTRAVENOUS
  Administered 2019-08-10: 120 ug via INTRAVENOUS

## 2019-08-10 MED ORDER — ACETAMINOPHEN 500 MG PO TABS: 1000.0000 mg | ORAL_TABLET | Freq: Once | ORAL | Status: DC | PRN

## 2019-08-10 MED ORDER — MIDAZOLAM HCL 2 MG/2ML IJ SOLN
INTRAMUSCULAR | Status: AC
  Filled 2019-08-10: qty 2

## 2019-08-10 MED ORDER — FENTANYL CITRATE (PF) 250 MCG/5ML IJ SOLN
INTRAMUSCULAR | Status: AC
  Filled 2019-08-10: qty 5

## 2019-08-10 MED ORDER — NON FORMULARY: 5.0000 mg | Freq: Once | Status: DC

## 2019-08-10 MED ORDER — DEXMEDETOMIDINE HCL 200 MCG/2ML IV SOLN
INTRAVENOUS | Status: DC | PRN
  Administered 2019-08-10: 16 ug via INTRAVENOUS
  Administered 2019-08-10: 4 ug via INTRAVENOUS

## 2019-08-10 MED ORDER — OXYCODONE HCL 5 MG PO TABS
5.0000 mg | ORAL_TABLET | Freq: Once | ORAL | Status: AC | PRN
  Administered 2019-08-10: 5 mg via ORAL

## 2019-08-10 MED ORDER — ONDANSETRON HCL 4 MG/2ML IJ SOLN
INTRAMUSCULAR | Status: DC | PRN
  Administered 2019-08-10: 4 mg via INTRAVENOUS

## 2019-08-10 MED ORDER — LACTATED RINGERS IV SOLN
INTRAVENOUS | Status: DC | PRN
  Administered 2019-08-10: 10:00:00 via INTRAVENOUS

## 2019-08-10 MED ORDER — FENTANYL CITRATE (PF) 100 MCG/2ML IJ SOLN
25.0000 ug | INTRAMUSCULAR | Status: DC | PRN
  Administered 2019-08-10 (×2): 50 ug via INTRAVENOUS

## 2019-08-10 MED ORDER — LIDOCAINE 2% (20 MG/ML) 5 ML SYRINGE
INTRAMUSCULAR | Status: DC | PRN
  Administered 2019-08-10: 10 mg via INTRAVENOUS
  Administered 2019-08-10: 60 mg via INTRAVENOUS

## 2019-08-10 MED ORDER — OXYCODONE HCL 5 MG PO TABS
ORAL_TABLET | ORAL | Status: AC
  Administered 2019-08-10: 5 mg
  Filled 2019-08-10: qty 1

## 2019-08-10 MED ORDER — VANCOMYCIN HCL 1000 MG IV SOLR
INTRAVENOUS | Status: DC | PRN
  Administered 2019-08-10: 1000 mL

## 2019-08-10 MED ORDER — LACTATED RINGERS IV SOLN
INTRAVENOUS | Status: DC
  Administered 2019-08-10: 09:00:00 via INTRAVENOUS

## 2019-08-10 MED ORDER — SUGAMMADEX SODIUM 200 MG/2ML IV SOLN
INTRAVENOUS | Status: DC | PRN
  Administered 2019-08-10: 150 mg via INTRAVENOUS

## 2019-08-10 MED ORDER — DEXAMETHASONE SODIUM PHOSPHATE 10 MG/ML IJ SOLN
INTRAMUSCULAR | Status: AC
  Filled 2019-08-10: qty 1

## 2019-08-10 MED ORDER — MELATONIN 3 MG PO TABS
6.0000 mg | ORAL_TABLET | Freq: Once | ORAL | Status: DC
  Filled 2019-08-10: qty 2

## 2019-08-10 MED ORDER — OXYCODONE HCL 5 MG/5ML PO SOLN: 5.0000 mg | Freq: Once | ORAL | Status: AC | PRN

## 2019-08-10 SURGICAL SUPPLY — 73 items
APL SKNCLS STERI-STRIP NONHPOA (GAUZE/BANDAGES/DRESSINGS)
ATTRACTOMAT 16X20 MAGNETIC DRP (DRAPES) IMPLANT
BAG DECANTER FOR FLEXI CONT (MISCELLANEOUS) ×1 IMPLANT
BENZOIN TINCTURE PRP APPL 2/3 (GAUZE/BANDAGES/DRESSINGS) IMPLANT
BLADE CLIPPER SURG (BLADE) ×1 IMPLANT
BLADE SURG 10 STRL SS (BLADE) ×1 IMPLANT
BLADE SURG 11 STRL SS (BLADE) ×3 IMPLANT
BLADE SURG 15 STRL LF DISP TIS (BLADE) IMPLANT
BLADE SURG 15 STRL SS (BLADE)
BNDG GAUZE ELAST 4 BULKY (GAUZE/BANDAGES/DRESSINGS) IMPLANT
CANISTER SUCT 3000ML PPV (MISCELLANEOUS) ×1 IMPLANT
CANISTER WOUND CARE 500ML ATS (WOUND CARE) ×3 IMPLANT
CATH FOLEY 2WAY SLVR  5CC 16FR (CATHETERS)
CATH FOLEY 2WAY SLVR 5CC 16FR (CATHETERS) IMPLANT
CATH THORACIC 28FR RT ANG (CATHETERS) IMPLANT
CATH THORACIC 36FR (CATHETERS) IMPLANT
CATH THORACIC 36FR RT ANG (CATHETERS) IMPLANT
CLIP VESOCCLUDE SM WIDE 24/CT (CLIP) IMPLANT
CONN Y 3/8X3/8X3/8  BEN (MISCELLANEOUS)
CONN Y 3/8X3/8X3/8 BEN (MISCELLANEOUS) IMPLANT
CONT SPEC 4OZ CLIKSEAL STRL BL (MISCELLANEOUS) IMPLANT
COVER SURGICAL LIGHT HANDLE (MISCELLANEOUS) ×4 IMPLANT
COVER WAND RF STERILE (DRAPES) IMPLANT
DRAIN CHANNEL 15F RND FF W/TCR (WOUND CARE) IMPLANT
DRAIN CHANNEL 19F RND (DRAIN) ×3 IMPLANT
DRAPE LAPAROSCOPIC ABDOMINAL (DRAPES) ×3 IMPLANT
DRAPE SLUSH/WARMER DISC (DRAPES) ×2 IMPLANT
DRAPE WARM FLUID 44X44 (DRAPES) IMPLANT
DRSG AQUACEL AG ADV 3.5X14 (GAUZE/BANDAGES/DRESSINGS) ×1 IMPLANT
DRSG PAD ABDOMINAL 8X10 ST (GAUZE/BANDAGES/DRESSINGS) IMPLANT
DRSG VAC ATS LRG SENSATRAC (GAUZE/BANDAGES/DRESSINGS) IMPLANT
DRSG VAC ATS MED SENSATRAC (GAUZE/BANDAGES/DRESSINGS) IMPLANT
DRSG VAC ATS SM SENSATRAC (GAUZE/BANDAGES/DRESSINGS) ×3 IMPLANT
ELECT REM PT RETURN 9FT ADLT (ELECTROSURGICAL) ×3
ELECTRODE REM PT RTRN 9FT ADLT (ELECTROSURGICAL) ×1 IMPLANT
EVACUATOR SILICONE 100CC (DRAIN) ×2 IMPLANT
GAUZE SPONGE 4X4 12PLY STRL (GAUZE/BANDAGES/DRESSINGS) ×3 IMPLANT
GAUZE XEROFORM 5X9 LF (GAUZE/BANDAGES/DRESSINGS) IMPLANT
GLOVE BIO SURGEON STRL SZ7.5 (GLOVE) ×3 IMPLANT
GLOVE ORTHO TXT STRL SZ7.5 (GLOVE) ×4 IMPLANT
GOWN STRL REUS W/ TWL LRG LVL3 (GOWN DISPOSABLE) ×4 IMPLANT
GOWN STRL REUS W/TWL LRG LVL3 (GOWN DISPOSABLE) ×9
HANDPIECE INTERPULSE COAX TIP (DISPOSABLE) ×3
HEMOSTAT POWDER SURGIFOAM 1G (HEMOSTASIS) IMPLANT
HEMOSTAT SURGICEL 2X14 (HEMOSTASIS) IMPLANT
KIT BASIN OR (CUSTOM PROCEDURE TRAY) ×3 IMPLANT
KIT SUCTION CATH 14FR (SUCTIONS) IMPLANT
KIT TURNOVER KIT B (KITS) ×3 IMPLANT
NS IRRIG 1000ML POUR BTL (IV SOLUTION) ×5 IMPLANT
PACK CHEST (CUSTOM PROCEDURE TRAY) IMPLANT
PACK GENERAL/GYN (CUSTOM PROCEDURE TRAY) ×3 IMPLANT
PAD ARMBOARD 7.5X6 YLW CONV (MISCELLANEOUS) ×6 IMPLANT
SET HNDPC FAN SPRY TIP SCT (DISPOSABLE) ×1 IMPLANT
SOL PREP POV-IOD 4OZ 10% (MISCELLANEOUS) IMPLANT
SPONGE LAP 18X18 RF (DISPOSABLE) ×1 IMPLANT
STAPLER VISISTAT 35W (STAPLE) IMPLANT
SUT ETHILON 3 0 FSL (SUTURE) IMPLANT
SUT SILK  1 MH (SUTURE) ×2
SUT SILK 1 MH (SUTURE) IMPLANT
SUT STEEL 6MS V (SUTURE) IMPLANT
SUT STEEL STERNAL CCS#1 18IN (SUTURE) IMPLANT
SUT STEEL SZ 6 DBL 3X14 BALL (SUTURE) IMPLANT
SUT VIC AB 1 CTX 36 (SUTURE)
SUT VIC AB 1 CTX36XBRD ANBCTR (SUTURE) ×2 IMPLANT
SUT VIC AB 2-0 CTX 27 (SUTURE) ×2 IMPLANT
SUT VIC AB 3-0 X1 27 (SUTURE) ×2 IMPLANT
SWAB COLLECTION DEVICE MRSA (MISCELLANEOUS) IMPLANT
SWAB CULTURE ESWAB REG 1ML (MISCELLANEOUS) IMPLANT
SYR 5ML LL (SYRINGE) IMPLANT
TOWEL GREEN STERILE (TOWEL DISPOSABLE) ×3 IMPLANT
TOWEL GREEN STERILE FF (TOWEL DISPOSABLE) ×3 IMPLANT
TRAY FOLEY MTR SLVR 16FR STAT (SET/KITS/TRAYS/PACK) IMPLANT
WATER STERILE IRR 1000ML POUR (IV SOLUTION) ×3 IMPLANT

## 2019-08-10 NOTE — Progress Notes (Signed)
Pre Procedure note for inpatients:   William [name] has been scheduled for Procedure(s): WOUND DEBRIDEMENT (N/A) WOUND VAC CHANGE (N/A) today. The various methods of treatment have been discussed with the patient. After consideration of the risks, benefits and treatment options the patient has consented to the planned procedure.   The patient has been seen and labs reviewed. There are no changes in the patient's condition to prevent proceeding with the planned procedure today.  Recent labs:  Lab Results  Component Value Date   WBC 8.6 08/10/2019   HGB 7.9 (L) 08/10/2019   HCT 23.8 (L) 08/10/2019   PLT 402 (H) 08/10/2019   GLUCOSE 133 (H) 08/10/2019   ALT 15 08/06/2019   AST 20 08/06/2019   NA 136 08/10/2019   K 3.0 (L) 08/10/2019   CL 99 08/10/2019   CREATININE 0.86 08/10/2019   BUN 14 08/10/2019   CO2 27 08/10/2019   INR 1.3 (H) 08/01/2019   HGBA1C 7.8 (H) 08/01/2019    Len Childs, MD 08/10/2019 9:24 AM

## 2019-08-10 NOTE — Progress Notes (Signed)
CHG bath given by RN Inocente Salles

## 2019-08-10 NOTE — Brief Op Note (Signed)
08/10/2019  11:32 AM  PATIENT:  William [name]  38 y.o. male  PRE-OPERATIVE DIAGNOSIS:  Left Chest Wall Wound  POST-OPERATIVE DIAGNOSIS: Left Chest Wall Wound  PROCEDURE:  Procedure(s): WOUND DEBRIDEMENT (N/A) WOUND VAC CHANGE (N/A) Excisional Wound Debridement,Wound  Antibiotic Irrigation, Wound Vac change, Drainage of L pleural space SURGEON:  Surgeon(s) and Role:    Ivin Poot, MD - Primary  PHYSICIAN ASSISTANT: non3  ASSISTANTS: none   ANESTHESIA:   general  EBL:  25 mL   BLOOD ADMINISTERED:none  DRAINS: (42F) Blake drain(s) in the L pleural space and L subpectoral space   LOCAL MEDICATIONS USED:  NONE  SPECIMEN:  No Specimen  DISPOSITION OF SPECIMEN:  N/A  COUNTS:  YES  TOURNIQUET:  * No tourniquets in log *  DICTATION: .Dragon Dictation  PLAN OF CARE: return to room 3 W  PATIENT DISPOSITION:  PACU - hemodynamically stable.   Delay start of Pharmacological VTE agent (>24hrs) due to surgical blood loss or risk of bleeding: yes

## 2019-08-10 NOTE — Progress Notes (Signed)
PROGRESS NOTE  William [name]  DOB: 03-04-1981  PCP: Patient, No Pcp Per SAY:[id number]  DOA: 07/31/2019  LOS: 9 days   Brief narrative: 38 year old no previous medical history, heroin injection for last 10 years presented to emergency room on 9/1 with multiple complaints including lower back pain, left shoulder pain, right foot and heel pain.  In the emergency room, he has right heel abscess, left upper chest wall abscess and mass, CT scan noted left anterior chest wall soft tissue and intramuscular infection with extension into the thoracic cavity, air in the soft tissues of the right axilla adjacent to the right shoulder, multiple small pulmonary nodules.  Patient was admitted to ICU.  Underwent surgical debridement of the chest wall abscess and wound VAC placement.  Also found to have new diagnosis of diabetes.  MRI showed multiple epidural and paraspinal abscesses. Blood cultures 9/2: MSSA Blood cultures 9/3: Pending, no growth so far. Wound culture 9/2: MSSA. Right heel culture 9/3: Abundant WBC.  MSSA. Repeat c/s : 9/3,9/6 negative so far.   Subjective: Patient was seen and examined this afternoon.  Underwent wound debridement of left chest wall wound as well as drainage of left pleural space today.  Assessment/Plan:  Sepsis present on admission with MSSA bacteremia due to IV drug use: Septic pulmonary emboli, Left chest wall abscess, Right heel abscess, Extensive right posterolateral epidural abscess L2-L3, Osteomyelitis of the L5-S1, Posterior epidural abscess L5-S1 S2. - Status post incision and drainage and debridement left chest wall on wound VAC, followed by CT CS. Grossly purulent continuous drain.  Patient underwent wound debridement of left chest wall as well as drainage of left pleural space today. - Bedside I&D right heel, MSSA. - Multiple epidural abscess and discitis, seen by neurosurgery recommended no surgical drainage but antibiotics. - followed by infectious disease.   He was on cefazolin and metronidazole.  Now remains on nafcillin.  Repeat cultures were negative. - Intraoperative TEE with no evidence of endocarditis. - Anticipate intravenous antibiotics for about 6 to 8 weeks.  Patient has exhausted IV access.  PICC line inserted. -08/09/2019: Pain in the stiffness of the right shoulder.  No obvious abscess or swelling.  CT scan with contrast ordered. Left heel swelling improving with no evidence of abscess.  Orthopedic consult appreciated.  Recommended to continue IV antibiotics for now.  IV drug use: Ongoing opiate use.  Patient is motivated to quit.  Will provide symptomatic treatment.  Opiates for pain relief for postoperative pain.  Will use Tylenol and muscle relaxants. Used metoprolol for tachycardia. Patient to use Xanax for craving and anxiety.  New onset diabetes: A1c 7.8.  Started on insulin.  Stabilizing on increased dose of insulin.  Currently on Levemir to 20 units and prandial insulin.  Body mass index is 22.25 kg/m. Mobility: Encourage ambulation Diet: Diabetic diet DVT prophylaxis:  Heparin subcu Code Status:   Code Status: Full Code  Family Communication:  Expected Discharge:  Continue inpatient management  Consultants:   PCCM, transfer out  CT CS, following  Neurosurgery, signed off  Orthopedics, signed off  Procedures:   Chest wall abscess incision and drainage, 08/02/2019  Right heel incision and drainage, 08/03/2019  Antimicrobials:   Vancomycin, 08/01/2019>>> 08/02/2019  Cefepime, 08/01/2019>>> 08/02/2019  Cefazolin, 08/02/2019>>>>> 08/06/2019  Flagyl, 08/02/2019>>> 08/06/2019  Diflucan, on 08/03/2019 dose  Nystatin swish and swallow, 08/03/2019 >>>  Nafcillin 08/06/2019>>>>  Infusions:  . lactated ringers 10 mL/hr at 08/10/19 0845  . nafcillin IV 2 g (08/10/19 1505)  Scheduled Meds: . Chlorhexidine Gluconate Cloth  6 each Topical Daily  . docusate sodium  100 mg Oral Daily  . feeding supplement (GLUCERNA SHAKE)   237 mL Oral TID BM  . heparin  5,000 Units Subcutaneous Q8H  . insulin aspart  0-15 Units Subcutaneous TID WC  . insulin aspart  0-5 Units Subcutaneous QHS  . insulin aspart  5 Units Subcutaneous TID WC  . insulin glargine  20 Units Subcutaneous Daily  . Melatonin  6 mg Oral Once  . metoprolol tartrate  12.5 mg Oral BID  . mupirocin ointment  1 application Nasal BID  . nystatin  5 mL Oral QID  . ondansetron  4 mg Intravenous Once  . senna-docusate  1 tablet Oral BID  . sodium chloride flush  10-40 mL Intracatheter Q12H    PRN meds: acetaminophen, ALPRAZolam, calcium carbonate, ondansetron (ZOFRAN) IV, oxyCODONE, promethazine, sodium chloride flush   Objective: Vitals:   08/10/19 1215 08/10/19 1223  BP:    Pulse: 83   Resp: 16   Temp:  (!) 97 F (36.1 C)  SpO2: 100%     Intake/Output Summary (Last 24 hours) at 08/10/2019 1526 Last data filed at 08/10/2019 1223 Gross per 24 hour  Intake 2060 ml  Output 1750 ml  Net 310 ml   Filed Weights   08/05/19 0500 08/09/19 0500 08/10/19 0433  Weight: 84.3 kg 78.6 kg 78.6 kg   Weight change: 0 kg Body mass index is 22.25 kg/m.   Physical Exam: General exam: Appears calm and comfortable.  Skin: No rashes, lesions or ulcers. HEENT: Atraumatic, normocephalic, supple neck, no obvious bleeding Lungs: Clear to auscultation bilaterally CVS: Regular rate and rhythm GI/Abd soft, nontender, nondistended, bowel sound present CNS: No pedal edema, no calf tenderness Psychiatry: Mood appropriate Extremities: No pedal edema, no calf tenderness  Data Review: I have personally reviewed the laboratory data and studies available.  Recent Labs  Lab 08/04/19 0352 08/06/19 0738 08/08/19 1038 08/10/19 0447  WBC 12.1* 11.6* 11.1* 8.6  NEUTROABS 9.9* 9.8* 9.6* 7.0  HGB 9.7* 8.4* 8.6* 7.9*  HCT 28.4* 24.2* 25.8* 23.8*  MCV 88.5 88.3 91.2 92.6  PLT 450* 355 431* 402*   Recent Labs  Lab 08/04/19 0352 08/06/19 0738 08/08/19 1038 08/10/19  0447  NA 134* 133* 135 136  K 3.0* 3.3* 3.3* 3.0*  CL 95* 93* 96* 99  CO2 27 28 28 27   GLUCOSE 190* 196* 260* 133*  BUN 28* 21* 17 14  CREATININE 1.24 1.05 1.00 0.86  CALCIUM 8.0* 7.4* 7.8* 7.7*  MG 1.9 1.7 1.9 1.9  PHOS 3.9 2.7 2.9 3.7    Lorin GlassBinaya Kesa Birky, MD  [facility] 08/10/2019

## 2019-08-10 NOTE — Progress Notes (Signed)
[facility] Infectious [facility]   Reason for visit: Follow up on MSSA sepsis, lumbar discitis, chest wall abscess  Antibiotics: Nafcillin, day 5 + 6 days of vanc/cefepime vs. cefazolin  Interval History: Pt returned to the OR for repeat I&D to RT  joint and wound VAC exchange as purulence noted from wound site earlier in the week. Blood cxs repeated yesterday due to persistent fever. C/o RT shoulder pain but ortho has not aspirated this joint and defers surgery at this time (pt admits shoulder soreness may be related to efforts to ambulate with PT using walker and attempting to avoid bearing down with his LT arm due to surgical wound and wound VAC). Fever curve, WBC & glycemic trends, prior serologies, imaging, and ABX usage all  independently reviewed    Current Facility-Administered Medications:    acetaminophen (TYLENOL) tablet 650 mg, 650 mg, Oral, Q6H PRN, Prescott Gum, Collier Salina, MD, 650 mg at 08/09/19 1418   ALPRAZolam Duanne Moron) tablet 0.25 mg, 0.25 mg, Oral, TID PRN, Prescott Gum, Collier Salina, MD, 0.25 mg at 08/10/19 1200   calcium carbonate (TUMS - dosed in mg elemental calcium) chewable tablet 400 mg of elemental calcium, 400 mg of elemental calcium, Oral, TID PRN, Prescott Gum, Collier Salina, MD, 400 mg of elemental calcium at 08/07/19 0504   Chlorhexidine Gluconate Cloth 2 % PADS 6 each, 6 each, Topical, Daily, Ivin Poot, MD, 6 each at 08/10/19 0807   docusate sodium (COLACE) capsule 100 mg, 100 mg, Oral, Daily, Prescott Gum, Collier Salina, MD, 100 mg at 08/08/19 0900   feeding supplement (GLUCERNA SHAKE) (GLUCERNA SHAKE) liquid 237 mL, 237 mL, Oral, TID BM, Prescott Gum, Collier Salina, MD, 237 mL at 08/10/19 1506   heparin injection 5,000 Units, 5,000 Units, Subcutaneous, Q8H, Prescott Gum, Collier Salina, MD, 5,000 Units at 08/10/19 1505   insulin aspart (novoLOG) injection 0-15 Units, 0-15 Units, Subcutaneous, TID WC, Prescott Gum, Collier Salina, MD, 2 Units at 08/09/19 1750   insulin aspart (novoLOG) injection 0-5 Units,  0-5 Units, Subcutaneous, QHS, Prescott Gum, Collier Salina, MD, 2 Units at 08/08/19 2108   insulin aspart (novoLOG) injection 5 Units, 5 Units, Subcutaneous, TID WC, Prescott Gum, Collier Salina, MD, 5 Units at 08/09/19 1750   insulin glargine (LANTUS) injection 20 Units, 20 Units, Subcutaneous, Daily, Prescott Gum, Collier Salina, MD, 20 Units at 08/09/19 1154   lactated ringers infusion, , Intravenous, Continuous, Prescott Gum, Collier Salina, MD, Last Rate: 10 mL/hr at 08/10/19 0845   Melatonin TABS 6 mg, 6 mg, Oral, Once, Prescott Gum, Collier Salina, MD   metoprolol tartrate (LOPRESSOR) tablet 12.5 mg, 12.5 mg, Oral, BID, Prescott Gum, Collier Salina, MD, 12.5 mg at 08/10/19 0842   mupirocin ointment (BACTROBAN) 2 % 1 application, 1 application, Nasal, BID, Prescott Gum, Collier Salina, MD, 1 application at 92/42/68 0844   nafcillin 2 g in sodium chloride 0.9 % 100 mL IVPB, 2 g, Intravenous, Q4H, Prescott Gum, Collier Salina, MD, Last Rate: 200 mL/hr at 08/10/19 1505, 2 g at 08/10/19 1505   nystatin (MYCOSTATIN) 100000 UNIT/ML suspension 500,000 Units, 5 mL, Oral, QID, Prescott Gum, Collier Salina, MD, 500,000 Units at 08/10/19 1505   ondansetron (ZOFRAN) injection 4 mg, 4 mg, Intravenous, Once, Prescott Gum, Collier Salina, MD   ondansetron [facility]) injection 4 mg, 4 mg, Intravenous, Q8H PRN, Prescott Gum, Collier Salina, MD, 4 mg at 08/03/19 0516   oxyCODONE (Oxy IR/ROXICODONE) immediate release tablet 5 mg, 5 mg, Oral, Q4H PRN, Prescott Gum, Collier Salina, MD, 5 mg at 08/10/19 0157   promethazine (PHENERGAN) injection 6.25 mg, 6.25 mg, Intravenous, Q6H  PRN, Prescott Gum, Collier Salina, MD, 6.25 mg at 08/09/19 0209   senna-docusate (Senokot-S) tablet 1 tablet, 1 tablet, Oral, BID, Prescott Gum, Collier Salina, MD, 1 tablet at 08/08/19 2107   sodium chloride flush (NS) 0.9 % injection 10-40 mL, 10-40 mL, Intracatheter, Q12H, Prescott Gum, Collier Salina, MD, 10 mL at 08/09/19 2147   sodium chloride flush (NS) 0.9 % injection 10-40 mL, 10-40 mL, Intracatheter, PRN, Prescott Gum, Collier Salina, MD   Physical Exam:   Vitals:   08/10/19 1215 08/10/19 1223  BP:      Pulse: 83   Resp: 16   Temp:  (!) 97 F (36.1 C)  SpO2: 100%    Physical Exam Gen: anxious, moderate distress secondary to back and LT chest wall pain, A&Ox 3 Head: NCAT, no temporal wasting evident EENT: PERRL, EOMI, MMM, adequate dentition Neck: supple, mild JVD CV: NRRR, no murmurs evident Pulm: CTA bilaterally, mild expiratory wheeze , no retractions Abd: soft, NTND, +BS (hypoactive) MSK: TTP to lumbar spine, +RT heel wound w/o drainage, LT chest wall wound VAC intact, RT knee w/ full ROM on active movement from the pt Extrems:  trace LE edema, 2+ pulses Skin: no rashes, adequate skin turgor Neuro: CN II-XII grossly intact, no focal neurologic deficits appreciated, gait was not assessed, A&Ox 3 Psych: very anxious/easily agitated    Review of Systems:  Review of Systems  Constitutional: Positive for chills and fever. Negative for weight loss.  HENT: Negative for congestion, hearing loss, sinus pain and sore throat.   Eyes: Negative for blurred vision, photophobia and discharge.  Respiratory: Positive for shortness of breath. Negative for cough and hemoptysis.   Cardiovascular: Positive for chest pain. Negative for palpitations, orthopnea and leg swelling.  Gastrointestinal: Positive for nausea. Negative for abdominal pain, constipation, diarrhea, heartburn and vomiting.  Genitourinary: Negative for dysuria, flank pain, frequency and urgency.  Musculoskeletal: Positive for back pain and joint pain. Negative for myalgias.       Back, RT heel, and LT chest wall pain all reported PTA  Skin: Negative for itching and rash.  Neurological: Negative for tremors, seizures, weakness and headaches.  Endo/Heme/Allergies: Negative for polydipsia. Does not bruise/bleed easily.  Psychiatric/Behavioral: Positive for substance abuse. Negative for depression. The patient is not nervous/anxious and does not have insomnia.      Lab Results  Component Value Date   WBC 8.6 08/10/2019   HGB  7.9 (L) 08/10/2019   HCT 23.8 (L) 08/10/2019   MCV 92.6 08/10/2019   PLT 402 (H) 08/10/2019    Lab Results  Component Value Date   CREATININE 0.86 08/10/2019   BUN 14 08/10/2019   NA 136 08/10/2019   K 3.0 (L) 08/10/2019   CL 99 08/10/2019   CO2 27 08/10/2019    Lab Results  Component Value Date   ALT 15 08/06/2019   AST 20 08/06/2019   ALKPHOS 96 08/06/2019     Microbiology: Recent Results (from the past 240 hour(s))  Urine culture     Status: Abnormal   Collection Time: 07/31/19 10:58 PM   Specimen: In/Out Cath Urine  Result Value Ref Range Status   Specimen Description IN/OUT CATH URINE  Final   Special Requests   Final    NONE Performed at [facility], [address], [place], [place] [address]    Culture >=100,000 COLONIES/mL STAPHYLOCOCCUS AUREUS (A)  Final   Report Status 08/02/2019 FINAL  Final   Organism ID, Bacteria STAPHYLOCOCCUS AUREUS (A)  Final  Susceptibility   Staphylococcus aureus - MIC*    CIPROFLOXACIN 1 SENSITIVE Sensitive     GENTAMICIN <=0.5 SENSITIVE Sensitive     NITROFURANTOIN <=16 SENSITIVE Sensitive     OXACILLIN 0.5 SENSITIVE Sensitive     TETRACYCLINE <=1 SENSITIVE Sensitive     VANCOMYCIN <=0.5 SENSITIVE Sensitive     TRIMETH/SULFA <=10 SENSITIVE Sensitive     CLINDAMYCIN <=0.25 SENSITIVE Sensitive     RIFAMPIN <=0.5 SENSITIVE Sensitive     Inducible Clindamycin NEGATIVE Sensitive     * >=100,000 COLONIES/mL STAPHYLOCOCCUS AUREUS  Blood culture (routine x 2)     Status: Abnormal   Collection Time: 08/01/19  1:36 AM   Specimen: BLOOD  Result Value Ref Range Status   Specimen Description BLOOD RIGHT ANTECUBITAL  Final   Special Requests   Final    BOTTLES DRAWN AEROBIC AND ANAEROBIC Blood Culture adequate volume   Culture  Setup Time   Final    GRAM POSITIVE COCCI IN BOTH AEROBIC AND ANAEROBIC BOTTLES CRITICAL RESULT CALLED TO, READ BACK BY AND VERIFIED WITH: Dion Body PharmD 17:20 08/01/19 ([email or web address]) Performed at [facility], [place] [address] [place], [place] [address]    Culture STAPHYLOCOCCUS AUREUS (A)  Final   Report Status 08/03/2019 FINAL  Final   Organism ID, Bacteria STAPHYLOCOCCUS AUREUS  Final      Susceptibility   Staphylococcus aureus - MIC*    CIPROFLOXACIN <=0.5 SENSITIVE Sensitive     ERYTHROMYCIN <=0.25 SENSITIVE Sensitive     GENTAMICIN <=0.5 SENSITIVE Sensitive     OXACILLIN <=0.25 SENSITIVE Sensitive     TETRACYCLINE <=1 SENSITIVE Sensitive     VANCOMYCIN 1 SENSITIVE Sensitive     TRIMETH/SULFA <=10 SENSITIVE Sensitive     CLINDAMYCIN <=0.25 SENSITIVE Sensitive     RIFAMPIN <=0.5 SENSITIVE Sensitive     Inducible Clindamycin NEGATIVE Sensitive     * STAPHYLOCOCCUS AUREUS  Blood Culture ID Panel (Reflexed)     Status: Abnormal   Collection Time: 08/01/19  1:36 AM  Result Value Ref Range Status   Enterococcus species NOT DETECTED NOT DETECTED Final   Listeria monocytogenes NOT DETECTED NOT DETECTED Final   Staphylococcus species DETECTED (A) NOT DETECTED Final    Comment: CRITICAL RESULT CALLED TO, READ BACK BY AND VERIFIED WITH: Dion Body PharmD 17:20 08/01/19 ([email or web address])    Staphylococcus aureus (BCID) DETECTED (A) NOT DETECTED Final    Comment: Methicillin (oxacillin) susceptible Staphylococcus aureus (MSSA). Preferred therapy is anti staphylococcal beta lactam antibiotic (Cefazolin or Nafcillin), unless clinically contraindicated. CRITICAL RESULT CALLED TO, READ BACK BY AND VERIFIED WITH: Dion Body PharmD 17:20 08/01/19 ([email or web address])    Methicillin resistance NOT DETECTED NOT DETECTED Final   Streptococcus species NOT DETECTED NOT DETECTED Final   Streptococcus agalactiae NOT DETECTED NOT DETECTED Final   Streptococcus pneumoniae NOT DETECTED NOT DETECTED Final   Streptococcus pyogenes NOT DETECTED NOT DETECTED Final   Acinetobacter baumannii NOT DETECTED NOT DETECTED Final   Enterobacteriaceae species NOT DETECTED NOT DETECTED Final   Enterobacter cloacae complex NOT  DETECTED NOT DETECTED Final   Escherichia coli NOT DETECTED NOT DETECTED Final   Klebsiella oxytoca NOT DETECTED NOT DETECTED Final   Klebsiella pneumoniae NOT DETECTED NOT DETECTED Final   Proteus species NOT DETECTED NOT DETECTED Final   Serratia marcescens NOT DETECTED NOT DETECTED Final   Haemophilus influenzae NOT DETECTED NOT DETECTED Final   Neisseria meningitidis NOT DETECTED NOT DETECTED Final   Pseudomonas aeruginosa NOT DETECTED NOT DETECTED Final  Candida albicans NOT DETECTED NOT DETECTED Final   Candida glabrata NOT DETECTED NOT DETECTED Final   Candida krusei NOT DETECTED NOT DETECTED Final   Candida parapsilosis NOT DETECTED NOT DETECTED Final   Candida tropicalis NOT DETECTED NOT DETECTED Final    Comment: Performed at [facility], [place] [address], [place], [place] [address]  SARS Coronavirus 2 [facility] order, Performed in [facility] hospital lab) Nasopharyngeal Abscess     Status: None   Collection Time: 08/01/19  2:10 AM   Specimen: Abscess; Nasopharyngeal  Result Value Ref Range Status   SARS Coronavirus 2 NEGATIVE NEGATIVE Final    Comment: (NOTE) If result is NEGATIVE SARS-CoV-2 target nucleic acids are NOT DETECTED. The SARS-CoV-2 RNA is generally detectable in upper and lower  respiratory specimens during the acute phase of infection. The lowest  concentration of SARS-CoV-2 viral copies this assay can detect is 250  copies / mL. A negative result does not preclude SARS-CoV-2 infection  and should not be used as the sole basis for treatment or other  patient management decisions.  A negative result may occur with  improper specimen collection / handling, submission of specimen other  than nasopharyngeal swab, presence of viral mutation(s) within the  areas targeted by this assay, and inadequate number of viral copies  (<250 copies / mL). A negative result must be combined with clinical  observations, patient history, and epidemiological  information. If result is POSITIVE SARS-CoV-2 target nucleic acids are DETECTED. The SARS-CoV-2 RNA is generally detectable in upper and lower  respiratory specimens dur ing the acute phase of infection.  Positive  results are indicative of active infection with SARS-CoV-2.  Clinical  correlation with patient history and other diagnostic information is  necessary to determine patient infection status.  Positive results do  not rule out bacterial infection or co-infection with other viruses. If result is PRESUMPTIVE POSTIVE SARS-CoV-2 nucleic acids MAY BE PRESENT.   A presumptive positive result was obtained on the submitted specimen  and confirmed on repeat testing.  While 2019 novel coronavirus  (SARS-CoV-2) nucleic acids may be present in the submitted sample  additional confirmatory testing may be necessary for epidemiological  and / or clinical management purposes  to differentiate between  SARS-CoV-2 and other Sarbecovirus currently known to infect humans.  If clinically indicated additional testing with an alternate test  methodology [phone]) is advised. The SARS-CoV-2 RNA is generally  detectable in upper and lower respiratory sp ecimens during the acute  phase of infection. The expected result is Negative. Fact Sheet for Patients:  StrictlyIdeas.no Fact Sheet for Healthcare Providers: BankingDealers.co.za This test is not yet approved or cleared by the [place] FDA and has been authorized for detection and/or diagnosis of SARS-CoV-2 by FDA under an Emergency Use Authorization (EUA).  This EUA will remain in effect (meaning this test can be used) for the duration of the COVID-19 declaration under Section 564(b)(1) of the Act, 21 U.S.C. section 360bbb-3(b)(1), unless the authorization is terminated or revoked sooner. Performed at [facility], [place] [address], [place], [place] [address]   Wound or Superficial Culture      Status: None   Collection Time: 08/01/19  2:10 AM   Specimen: Abscess; Wound  Result Value Ref Range Status   Specimen Description ABSCESS  Final   Special Requests NONE  Final   Gram Stain   Final    RARE WBC PRESENT, PREDOMINANTLY PMN MODERATE GRAM POSITIVE COCCI IN CLUSTERS Performed at  [facility], [place] [address] [place], [place] [address]    Culture ABUNDANT STAPHYLOCOCCUS AUREUS  Final   Report Status 08/03/2019 FINAL  Final   Organism ID, Bacteria STAPHYLOCOCCUS AUREUS  Final      Susceptibility   Staphylococcus aureus - MIC*    CIPROFLOXACIN <=0.5 SENSITIVE Sensitive     ERYTHROMYCIN <=0.25 SENSITIVE Sensitive     GENTAMICIN <=0.5 SENSITIVE Sensitive     OXACILLIN <=0.25 SENSITIVE Sensitive     TETRACYCLINE <=1 SENSITIVE Sensitive     VANCOMYCIN <=0.5 SENSITIVE Sensitive     TRIMETH/SULFA <=10 SENSITIVE Sensitive     CLINDAMYCIN <=0.25 SENSITIVE Sensitive     RIFAMPIN <=0.5 SENSITIVE Sensitive     Inducible Clindamycin NEGATIVE Sensitive     * ABUNDANT STAPHYLOCOCCUS AUREUS  Blood culture (routine x 2)     Status: Abnormal   Collection Time: 08/01/19  2:40 AM   Specimen: BLOOD LEFT ARM  Result Value Ref Range Status   Specimen Description BLOOD LEFT ARM  Final   Special Requests   Final    BOTTLES DRAWN AEROBIC AND ANAEROBIC Blood Culture adequate volume   Culture  Setup Time   Final    GRAM POSITIVE COCCI IN BOTH AEROBIC AND ANAEROBIC BOTTLES CRITICAL VALUE NOTED.  VALUE IS CONSISTENT WITH PREVIOUSLY REPORTED AND CALLED VALUE.    Culture (A)  Final    STAPHYLOCOCCUS AUREUS SUSCEPTIBILITIES PERFORMED ON PREVIOUS CULTURE WITHIN THE LAST 5 DAYS. Performed at [facility], [place] [address], [place], [place] [address]    Report Status 08/03/2019 FINAL  Final  MRSA PCR Screening     Status: None   Collection Time: 08/01/19  7:28 AM   Specimen: Nasal Mucosa; Nasopharyngeal  Result Value Ref Range Status   MRSA by PCR NEGATIVE NEGATIVE Final     Comment:        The GeneXpert MRSA Assay (FDA approved for NASAL specimens only), is one component of a comprehensive MRSA colonization surveillance program. It is not intended to diagnose MRSA infection nor to guide or monitor treatment for MRSA infections. Performed at [facility], [place] [address], [place], [place] [address]   Aerobic/Anaerobic Culture (surgical/deep wound)     Status: None   Collection Time: 08/01/19  1:38 PM   Specimen: Abscess  Result Value Ref Range Status   Specimen Description ABSCESS LEFT CHEST WALL  Final   Special Requests PATIENT ON FOLLOWING MAXIPINE ANCEF  Final   Gram Stain   Final    ABUNDANT WBC PRESENT, PREDOMINANTLY PMN ABUNDANT GRAM POSITIVE COCCI    Culture   Final    ABUNDANT STAPHYLOCOCCUS AUREUS NO ANAEROBES ISOLATED Performed at [facility], [place] [address] [place], [place] [address]    Report Status 08/06/2019 FINAL  Final   Organism ID, Bacteria STAPHYLOCOCCUS AUREUS  Final      Susceptibility   Staphylococcus aureus - MIC*    CIPROFLOXACIN <=0.5 SENSITIVE Sensitive     ERYTHROMYCIN <=0.25 SENSITIVE Sensitive     GENTAMICIN <=0.5 SENSITIVE Sensitive     OXACILLIN <=0.25 SENSITIVE Sensitive     TETRACYCLINE <=1 SENSITIVE Sensitive     VANCOMYCIN 1 SENSITIVE Sensitive     TRIMETH/SULFA <=10 SENSITIVE Sensitive     CLINDAMYCIN <=0.25 SENSITIVE Sensitive     RIFAMPIN <=0.5 SENSITIVE Sensitive     Inducible Clindamycin NEGATIVE Sensitive     * ABUNDANT STAPHYLOCOCCUS AUREUS  Culture, blood (routine x 2)  Status: None   Collection Time: 08/02/19 10:26 AM   Specimen: BLOOD RIGHT ARM  Result Value Ref Range Status   Specimen Description BLOOD RIGHT ARM  Final   Special Requests   Final    BOTTLES DRAWN AEROBIC AND ANAEROBIC Blood Culture adequate volume   Culture   Final    NO GROWTH 5 DAYS Performed at [facility], [address] [place], [place] [address]    Report Status 08/07/2019 FINAL  Final   Culture, blood (routine x 2)     Status: None   Collection Time: 08/02/19 10:26 AM   Specimen: BLOOD LEFT ARM  Result Value Ref Range Status   Specimen Description BLOOD LEFT ARM  Final   Special Requests   Final    BOTTLES DRAWN AEROBIC ONLY Blood Culture adequate volume   Culture   Final    NO GROWTH 5 DAYS Performed at [facility], [place] [address], [place], [place] [address]    Report Status 08/07/2019 FINAL  Final  Aerobic Culture (superficial specimen)     Status: None   Collection Time: 08/03/19 11:24 AM   Specimen: Heel; Wound  Result Value Ref Range Status   Specimen Description HEEL  Final   Special Requests Normal  Final   Gram Stain   Final    ABUNDANT WBC PRESENT, PREDOMINANTLY PMN ABUNDANT GRAM POSITIVE COCCI Performed at [facility], [place] [address] [place], [place] [address]    Culture ABUNDANT STAPHYLOCOCCUS AUREUS  Final   Report Status 08/05/2019 FINAL  Final   Organism ID, Bacteria STAPHYLOCOCCUS AUREUS  Final      Susceptibility   Staphylococcus aureus - MIC*    CIPROFLOXACIN <=0.5 SENSITIVE Sensitive     ERYTHROMYCIN <=0.25 SENSITIVE Sensitive     GENTAMICIN <=0.5 SENSITIVE Sensitive     OXACILLIN <=0.25 SENSITIVE Sensitive     TETRACYCLINE <=1 SENSITIVE Sensitive     VANCOMYCIN 1 SENSITIVE Sensitive     TRIMETH/SULFA <=10 SENSITIVE Sensitive     CLINDAMYCIN <=0.25 SENSITIVE Sensitive     RIFAMPIN <=0.5 SENSITIVE Sensitive     Inducible Clindamycin NEGATIVE Sensitive     * ABUNDANT STAPHYLOCOCCUS AUREUS  Culture, blood (routine x 2)     Status: None   Collection Time: 08/05/19 11:03 AM   Specimen: BLOOD RIGHT HAND  Result Value Ref Range Status   Specimen Description BLOOD RIGHT HAND  Final   Special Requests   Final    BOTTLES DRAWN AEROBIC ONLY Blood Culture results may not be optimal due to an inadequate volume of blood received in culture bottles   Culture   Final    NO GROWTH 5 DAYS Performed at [facility], [place]  [address] [place], [place] [address]    Report Status 08/10/2019 FINAL  Final  Culture, blood (routine x 2)     Status: None   Collection Time: 08/05/19 11:04 AM   Specimen: BLOOD LEFT HAND  Result Value Ref Range Status   Specimen Description BLOOD LEFT HAND  Final   Special Requests   Final    BOTTLES DRAWN AEROBIC AND ANAEROBIC Blood Culture adequate volume   Culture   Final    NO GROWTH 5 DAYS Performed at [facility], [place] [address], [place], [place] [address]    Report Status 08/10/2019 FINAL  Final  Surgical PCR screen     Status: Abnormal   Collection Time: 08/07/19  5:18 PM  Specimen: Nasal Mucosa; Nasal Swab  Result Value Ref Range Status   MRSA, PCR NEGATIVE NEGATIVE Final   Staphylococcus aureus POSITIVE (A) NEGATIVE Final    Comment: (NOTE) The Xpert SA Assay (FDA approved for NASAL specimens in patients 61 years of age and older), is one component of a comprehensive surveillance program. It is not intended to diagnose infection nor to guide or monitor treatment. Performed at [facility], [place] [address] [place], [place] [address]   Culture, blood (routine x 2)     Status: None (Preliminary result)   Collection Time: 08/09/19  9:35 AM   Specimen: BLOOD RIGHT HAND  Result Value Ref Range Status   Specimen Description BLOOD RIGHT HAND  Final   Special Requests   Final    BOTTLES DRAWN AEROBIC ONLY Blood Culture results may not be optimal due to an inadequate volume of blood received in culture bottles   Culture   Final    NO GROWTH 1 DAY Performed at [facility], [place] [address] [place], [place] [address]    Report Status PENDING  Incomplete  Culture, blood (routine x 2)     Status: None (Preliminary result)   Collection Time: 08/09/19  9:43 AM   Specimen: BLOOD LEFT HAND  Result Value Ref Range Status   Specimen Description BLOOD LEFT HAND  Final   Special Requests   Final    BOTTLES DRAWN AEROBIC ONLY Blood Culture adequate volume   Culture    Final    NO GROWTH 1 DAY Performed at [facility], [place] [address], [place], [place] [address]    Report Status PENDING  Incomplete   OPAT ORDERS:  Diagnosis: disseminated MSSA sepsis (lumbar OM/epidural abscess, LT Oak septic arthritis, RT heel abscess/? Calcaneal OM)  Culture Result: MSSA  No Known Allergies  Discharge antibiotics: Per pharmacy protocol nafcillin 2 gm IV q 4 hrs  Duration: 8 weeks End Date: 10/01/2019   [facility] Care and Maintenance Per Protocol _X_ Please pull PIC at completion of IV antibiotics __ Please leave PIC in place until doctor has seen patient or been notified  Labs weekly while on IV antibiotics: _X_ CBC with differential __ BMP _X_ BMP TWICE WEEKLY** __ CMP _X_ CRP __ ESR __ Vancomycin trough  Fax weekly labs to [phone]  Clinic Follow Up Appt: Please re-consult ID prior to pt's hospital d/c for re-assessment   Impression/Plan: The patient is a 38 y/o WM IVDuer admitted withnew onset DM,fever and metastatic MSSA sepsis with septic PEs and LT chest wall abscess, s/p debridement, and multi-level lumbar osteomyelitis/epidural abscesses.  1. MSSA sepsis/chest wall abscess/septic pulmonary emboli- Initially, de-escalated ABX from vanc/cefepime to cefazolin 2 gm IV q 8 hrs and flagyl 500 mg PO q 8 hrs. TEE performed intra-op at the time of LT chest wall debridement and showed no active vegetation or significant valvular abnormalities. BSI cleared promptly, but due to disseminated nature of his infection, cefazolin was changed to nafcillin yesterday. Given presence of septic PEs, fever, known IVDU, and several other sites of metastatic spreadwith a high risk pathogen, endocarditis is highly suspected, so would not be unreasonable to repeat TEE later as well. Given concern for RT heel osteomyelitis with recent abscess, would extend ABX duration to 8 weeks total. Tentative ABX d/c date is 10/01/2019. Agree with repeat I&D today to [facility] joint  as purulence still noted from wound VAC site. Will sign off at this time, please call with ?s  and re-consult the final week of inpt ABX so ID can re-assess final duration of tx.  2. Lumbar osteomyelitis - presence of multilevel spinal osteomyelitis with now confirmed MSSA along with lumbar epidural abscess makes this patient high risk to fail attempts at medical monotherapy with evenprolongedoptimized ABX alone. Given the complexity and widespread dissemination of his infection, he may need to be considered for surgical intervention to lowerhismorbidity and mortalityfrom his infection. At minimum, he would benefit from an aspiration of his lumbar epidural abscessfor better control of this site. He will require 8 weeks of parenteral ABX treatment in total as noted above.  3. New onset DM -the patient's blood sugar was initially in the 700 range, requiring an insulin drip while in the ICU. Will defer management to primary care service. Would aim for tight glycemic control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

## 2019-08-10 NOTE — Consult Note (Addendum)
ORTHOPAEDIC CONSULTATION  REQUESTING PHYSICIAN: Lorin Glass, MD  PCP:  Patient, No Pcp Per  Chief Complaint: Right shoulder pain  HPI: William [name] is a 38 y.o. male who complains of right shoulder pain.  He has had a long complicated history during this current admission including a foot abscess and a chest wall abscess.  Both of these has been treated with I&D's.  Over the past 3 to 4 days he is noticed right shoulder pain and weakness.  The pain is primarily only when he attempts to move the shoulder.  He has no pain at rest.  He denies any injuries to the right shoulder.  He is headed to surgery today with thoracic surgery for an I&D of his chest wall abscess.  Past Medical History:  Diagnosis Date  . Staph infection 07/2019   CHEST WALL   Past Surgical History:  Procedure Laterality Date  . STERNAL WOUND DEBRIDEMENT Left 08/01/2019   Procedure: I&D Left Chest wall abscess with application of wound vac;  Surgeon: Delight Ovens, MD;  Location: [facility] OR;  Service: Thoracic;  Laterality: Left;  . TEE WITHOUT CARDIOVERSION N/A 08/01/2019   Procedure: TRANSESOPHAGEAL ECHOCARDIOGRAM (TEE);  Surgeon: Delight Ovens, MD;  Location: [facility] OR;  Service: Thoracic;  Laterality: N/A;   Social History   Socioeconomic History  . Marital status: Single    Spouse name: Not on file  . Number of children: Not on file  . Years of education: Not on file  . Highest education level: Not on file  Occupational History  . Not on file  Social Needs  . Financial resource strain: Not on file  . Food insecurity    Worry: Not on file    Inability: Not on file  . Transportation needs    Medical: Not on file    Non-medical: Not on file  Tobacco Use  . Smoking status: Never Smoker  . Smokeless tobacco: Never Used  Substance and Sexual Activity  . Alcohol use: Not Currently  . Drug use: Yes    Comment: opiates   . Sexual activity: Not Currently  Lifestyle  . Physical activity    Days per  week: Not on file    Minutes per session: Not on file  . Stress: Not on file  Relationships  . Social Musician on phone: Not on file    Gets together: Not on file    Attends religious service: Not on file    Active member of club or organization: Not on file    Attends meetings of clubs or organizations: Not on file    Relationship status: Not on file  Other Topics Concern  . Not on file  Social History Narrative  . Not on file   History reviewed. No pertinent family history. No Known Allergies Prior to Admission medications   Medication Sig Start Date End Date Taking? Authorizing Provider  diclofenac (CATAFLAM) 50 MG tablet Take 50 mg by mouth 2 (two) times daily. 07/26/19  Yes [provider]  metaxalone (SKELAXIN) 800 MG tablet Take 1 tablet (800 mg total) by mouth 3 (three) times daily. Patient not taking: Reported on 08/01/2019 07/22/19   Charlestine Night, PA-C  predniSONE (DELTASONE) 50 MG tablet Take 1 tablet (50 mg total) by mouth daily. Patient not taking: Reported on 08/01/2019 07/22/19   Charlestine Night, PA-C  traMADol (ULTRAM) 50 MG tablet Take 1 tablet (50 mg total) by mouth every 6 (six) hours  as needed for severe pain. Patient not taking: Reported on 08/01/2019 07/22/19   Dalia Heading, PA-C   Ct Shoulder Right W Contrast  Result Date: 08/09/2019 CLINICAL DATA:  Right shoulder pain. History of diskitis, osteomyelitis and epidural abscess. History of IV drug abuse. EXAM: CT OF THE UPPER RIGHT EXTREMITY WITH CONTRAST TECHNIQUE: Multidetector CT imaging of the upper right extremity was performed according to the standard protocol following intravenous contrast administration. COMPARISON:  Chest CT 08/01/2019 CONTRAST:  43mL OMNIPAQUE IOHEXOL 300 MG/ML  SOLN FINDINGS: Fairly extensive fluid noted in the subacromial/subdeltoid bursa and also in the subcoracoid bursa. There appear to be a few dots of air in the nondependent portion of the fluid worrisome  for septic bursitis. I do not see a definite joint effusion or findings suspicious for septic arthritis involving the shoulder joint or the AC joint. No findings suspicious for osteomyelitis. The rotator cuff tendons appear to be intact. No obvious full-thickness retracted tear. The right ribs are intact. There is a small cavitary lesion in the peripheral aspect of the right upper lobe which could be a focus of septic emboli. A second vague lesion is noted on the very last image in the right lower lobe. IMPRESSION: 1. CT findings suspicious for septic bursitis involving the right shoulder. I do not see any findings to suggest septic arthritis or osteomyelitis. 2. Small cavitary nodule in the peripheral aspect of the right upper lobe is suspicious for septic emboli. Electronically Signed   By: Marijo Sanes M.D.   On: 08/09/2019 14:12    Positive ROS: All other systems have been reviewed and were otherwise negative with the exception of those mentioned in the HPI and as above.  Physical Exam: General: Alert, no acute distress Cardiovascular: No pedal edema Respiratory: No cyanosis, no use of accessory musculature Skin: No lesions in the area of chief complaint. Wound vac on left chest wall. Psychiatric: Patient is competent for consent with normal mood and affect Lymphatic: No axillary or cervical lymphadenopathy  MUSCULOSKELETAL: Examination of the right shoulder shows grossly normal-appearing shoulder.  There is no significant swelling, erythema, lymphadenopathy or signs of infection.  Patient has no significant tenderness palpation about the shoulder.  There are no palpable fluid collections.  There are no obvious abscesses.  He has pain with active range of motion of the shoulder and can actively raise the arm to approximately 90 degrees of forward elevation. Passively he can be fully forward elevated.  He is able to keep the arm elevated after passive elevation.  There is no crepitus with range of  motion.  There is no tenderness distally in the arm.  His fingertips are all warm and well-perfused with brisk capillary refill.  His sensation is intact light touch throughout all digits.  Assessment: Right shoulder pain  Plan:  I think it is unlikely that this is infectious in nature. His exam is rather benign as there are no overt signs of infection.  I recommend though he continue with his IV antibiotics as indicated for his other sources of infection to see how this continues to improve his shoulder.  Additionally I recommend PT to work on range of motion and strengthening of the shoulder.  If his symptoms fail to improve we could consider an arthroscopic I&D of the shoulder but I have no plans for surgical intervention at this time.  Please call with any questions, concerns or changes in exam.    Verner Mould, MD [phone]  08/10/2019 7:24 AM

## 2019-08-10 NOTE — Anesthesia Procedure Notes (Signed)
Procedure Name: Intubation Date/Time: 08/10/2019 10:04 AM Performed by: Gaylene Brooks, CRNA Pre-anesthesia Checklist: Patient identified, Emergency Drugs available, Suction available and Patient being monitored Patient Re-evaluated:Patient Re-evaluated prior to induction Oxygen Delivery Method: Circle System Utilized Preoxygenation: Pre-oxygenation with 100% oxygen Induction Type: IV induction Ventilation: Mask ventilation without difficulty Laryngoscope Size: Miller and 2 Grade View: Grade II Tube type: Oral Tube size: 7.5 mm Number of attempts: 1 Airway Equipment and Method: Stylet and Oral airway Placement Confirmation: ETT inserted through vocal cords under direct vision,  positive ETCO2 and breath sounds checked- equal and bilateral Secured at: 23 cm Tube secured with: Tape Dental Injury: Teeth and Oropharynx as per pre-operative assessment

## 2019-08-10 NOTE — Plan of Care (Signed)
Progressing towards discharge 

## 2019-08-10 NOTE — Op Note (Signed)
NAME: William [name], William [name] MEDICAL RECORD [id number] ACCOUNT [id number] DATE OF BIRTH:[birth year] FACILITY: [facility] LOCATION: [facility] PHYSICIAN:PETER VAN TRIGT III, MD  OPERATIVE REPORT  DATE OF PROCEDURE:  08/10/2019  OPERATION: 1.  Excisional debridement of left chest wound. 2.  Pulse lavage irrigation of left chest wound. 3.  Wound vacuum-assisted closure sponge change of left chest wall wound. 4.  Jackson-Pratt drainage of loculated fluid in the left pleural space.  SURGEON:  Tharon Aquas Trigt III, MD  ANESTHESIA:  General.  PREOPERATIVE DIAGNOSIS:  Methicillin-sensitive Staphylococcus aureus bacteremia and chest wall abscess involving the left 1st rib, the left mediastinum and pleural space, and the left subpectoral space.  POSTOPERATIVE DIAGNOSIS:  Methicillin-sensitive Staphylococcus aureus bacteremia and chest wall abscess involving the left 1st rib, the left mediastinum and pleural space, and the left subpectoral space.  DESCRIPTION OF PROCEDURE:  After informed consent was documented and the proper site marked and final questions addressed, the patient was brought to the operating room for wound debridement and wound VAC change.  The patient had previously undergone  debridement and wound VAC placement approximately a week before.  The wound VAC persisted in draining large amounts of purulent material, so a repeat exam under anesthesia and debridement was recommended.  The patient had understood and was explained the  risks of bleeding, recurrent infection, and pain.  The patient was placed supine on the operating table.  General anesthesia was induced.  The previously placed wound VAC sponge was removed.  The left chest was prepped and draped as a sterile field.  A proper time-out was performed.  I examined the wound.  The pectoralis muscle appeared to be healthy.  The connective tissue and fascia had some areas of necrosis, and this was sharply debrided.  There is a subpectoralis  space anterior to the ribs that had some purulent material, and  this was irrigated out.  There was no necrotic tissue.  I probed the wound medially and it extended into the superior mediastinum and then along the medial aspect of the mediastinum inferiorly and may have involved the pleural space medially as well.   This was all suctioned and irrigated.  The main wound was then pulse lavaged with 1 L of vancomycin irrigation.  A 19-French JP Keenan Bachelor was then passed from the mediastinal aspect of the wound underneath the pectoralis muscle and then out lateral exit site.  A small wound VAC sponge was then cut to the appropriate size and configuration to fill the main incision.  The wound VAC sheaths were placed, and then the wound VAC suction line was attached and suction applied.  There was good compression of the  sponge.  The JP bulb remained compressed and had minimal drainage.  The patient was then evaluated with a chest x-ray in the operating room which showed no pneumothorax and the JP positioned into the left mediastinal-medial pleural space.  The patient  was then extubated and returned to recovery room in stable condition.  LN/NUANCE  D:08/10/2019 T:08/10/2019 JOB:[id number]

## 2019-08-10 NOTE — Anesthesia Preprocedure Evaluation (Signed)
Anesthesia Evaluation  Patient identified by MRN, date of birth, ID band Patient awake    Reviewed: Allergy & Precautions, NPO status , Patient's Chart, lab work & pertinent test results  History of Anesthesia Complications Negative for: history of anesthetic complications  Airway Mallampati: II  TM Distance: >3 FB Neck ROM: Full    Dental  (+) Teeth Intact, Dental Advisory Given   Pulmonary neg pulmonary ROS,    breath sounds clear to auscultation       Cardiovascular negative cardio ROS   Rhythm:Regular     Neuro/Psych negative neurological ROS  negative psych ROS   GI/Hepatic negative GI ROS, (+)     substance abuse  IV drug use,   Endo/Other  negative endocrine ROS  Renal/GU negative Renal ROS     Musculoskeletal   Abdominal   Peds  Hematology negative hematology ROS (+)   Anesthesia Other Findings   Reproductive/Obstetrics                             Anesthesia Physical Anesthesia Plan  ASA: II  Anesthesia Plan: General   Post-op Pain Management:    Induction: Intravenous  PONV Risk Score and Plan: 2 and Ondansetron and Dexamethasone  Airway Management Planned: Oral ETT  Additional Equipment: None  Intra-op Plan:   Post-operative Plan: Extubation in OR  Informed Consent: I have reviewed the patients History and Physical, chart, labs and discussed the procedure including the risks, benefits and alternatives for the proposed anesthesia with the patient or authorized representative who has indicated his/her understanding and acceptance.     Dental advisory given  Plan Discussed with: CRNA and Surgeon  Anesthesia Plan Comments:         Anesthesia Quick Evaluation

## 2019-08-10 NOTE — Progress Notes (Signed)
Patient off unit for procedure.

## 2019-08-10 NOTE — Transfer of Care (Signed)
Immediate Anesthesia Transfer of Care Note  Patient: William [name]  Procedure(s) Performed: WOUND DEBRIDEMENT (N/A ) WOUND VAC CHANGE (N/A )  Patient Location: PACU  Anesthesia Type:General  Level of Consciousness: awake, alert  and oriented  Airway & Oxygen Therapy: Patient Spontanous Breathing and Patient connected to nasal cannula oxygen  Post-op Assessment: Report given to RN, Post -op Vital signs reviewed and stable and Patient moving all extremities X 4  Post vital signs: Reviewed and stable  Last Vitals:  Vitals Value Taken Time  BP 115/71 08/10/19 1127  Temp    Pulse 84 08/10/19 1128  Resp 14 08/10/19 1128  SpO2 100 % 08/10/19 1128  Vitals shown include unvalidated device data.  Last Pain:  Vitals:   08/10/19 0841  TempSrc: Oral  PainSc:       Patients Stated Pain Goal: 2 (47/09/62 8366)  Complications: No apparent anesthesia complications

## 2019-08-10 NOTE — Progress Notes (Signed)
Patient back to unit from OR

## 2019-08-10 NOTE — Progress Notes (Signed)
Physical Therapy Cancellation Note    08/10/19 1101  PT Visit Information  Last PT Received On 08/10/19  Reason Eval/Treat Not Completed Patient at procedure or test/unavailable. Pt off unit for wound debridement and wound vac change. PT will continue to follow acutely.    Earney Navy, PTA Acute Rehabilitation Services Pager: [phone] Office: [phone]

## 2019-08-10 NOTE — Progress Notes (Addendum)
Notified Jeannette Corpus NP about patient's potassium being 3.0 this morning. Will continue to monitor.

## 2019-08-11 LAB — CBC WITH DIFFERENTIAL/PLATELET
Abs Immature Granulocytes: 0.05 10*3/uL (ref 0.00–0.07)
Basophils Absolute: 0 10*3/uL (ref 0.0–0.1)
Basophils Relative: 0 %
Eosinophils Absolute: 0 10*3/uL (ref 0.0–0.5)
Eosinophils Relative: 0 %
HCT: 22.9 % — ABNORMAL LOW (ref 39.0–52.0)
Hemoglobin: 7.6 g/dL — ABNORMAL LOW (ref 13.0–17.0)
Immature Granulocytes: 1 %
Lymphocytes Relative: 11 %
Lymphs Abs: 0.8 10*3/uL (ref 0.7–4.0)
MCH: 30.6 pg (ref 26.0–34.0)
MCHC: 33.2 g/dL (ref 30.0–36.0)
MCV: 92.3 fL (ref 80.0–100.0)
Monocytes Absolute: 0.3 10*3/uL (ref 0.1–1.0)
Monocytes Relative: 4 %
Neutro Abs: 6.3 10*3/uL (ref 1.7–7.7)
Neutrophils Relative %: 84 %
Platelets: 444 10*3/uL — ABNORMAL HIGH (ref 150–400)
RBC: 2.48 MIL/uL — ABNORMAL LOW (ref 4.22–5.81)
RDW: 14.9 % (ref 11.5–15.5)
WBC: 7.4 10*3/uL (ref 4.0–10.5)
nRBC: 0 % (ref 0.0–0.2)

## 2019-08-11 LAB — GLUCOSE, CAPILLARY
Glucose-Capillary: 156 mg/dL — ABNORMAL HIGH (ref 70–99)
Glucose-Capillary: 195 mg/dL — ABNORMAL HIGH (ref 70–99)
Glucose-Capillary: 258 mg/dL — ABNORMAL HIGH (ref 70–99)
Glucose-Capillary: 130 mg/dL — ABNORMAL HIGH (ref 70–99)

## 2019-08-11 LAB — BASIC METABOLIC PANEL
Anion gap: 8 (ref 5–15)
BUN: 11 mg/dL (ref 6–20)
CO2: 29 mmol/L (ref 22–32)
Calcium: 7.5 mg/dL — ABNORMAL LOW (ref 8.9–10.3)
Chloride: 100 mmol/L (ref 98–111)
Creatinine, Ser: 0.81 mg/dL (ref 0.61–1.24)
GFR calc Af Amer: >60 mL/min (ref >60)
GFR calc non Af Amer: >60 mL/min (ref >60)
Glucose, Bld: 131 mg/dL — ABNORMAL HIGH (ref 70–99)
Potassium: 2.5 mmol/L — CL (ref 3.5–5.1)
Sodium: 137 mmol/L (ref 135–145)

## 2019-08-11 MED ORDER — POTASSIUM CHLORIDE 10 MEQ/100ML IV SOLN
10.0000 meq | INTRAVENOUS | Status: AC
  Administered 2019-08-11 (×3): 10 meq via INTRAVENOUS
  Filled 2019-08-11 (×3): qty 100

## 2019-08-11 MED ORDER — TRAZODONE HCL 50 MG PO TABS: 50.0000 mg | ORAL_TABLET | Freq: Every day | ORAL | Status: DC

## 2019-08-11 MED ORDER — TRAZODONE HCL 50 MG PO TABS
50.0000 mg | ORAL_TABLET | Freq: Every evening | ORAL | Status: DC | PRN
  Administered 2019-08-11 – 2019-10-02 (×48): 50 mg via ORAL
  Filled 2019-08-11 (×52): qty 1

## 2019-08-11 MED ORDER — POTASSIUM CHLORIDE CRYS ER 20 MEQ PO TBCR
40.0000 meq | EXTENDED_RELEASE_TABLET | Freq: Once | ORAL | Status: AC
  Administered 2019-08-11: 40 meq via ORAL
  Filled 2019-08-11: qty 2
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
## 2019-08-11 NOTE — Progress Notes (Addendum)
      [address]       [place] [address]             [phone]       1 Day Post-Op Procedure(s) (LRB): WOUND DEBRIDEMENT (N/A) WOUND VAC CHANGE (N/A)  Subjective: Patient asking about surgery yesterday.  Objective: Vital signs in last 24 hours: Temp:  [97 F (36.1 C)-99.3 F (37.4 C)] 99.2 F (37.3 C) (09/12 0300) Pulse Rate:  [81-98] 98 (09/12 0300) Cardiac Rhythm: Normal sinus rhythm (09/11 1200) Resp:  [12-18] 18 (09/12 0300) BP: (115-156)/(71-89) 156/86 (09/12 0300) SpO2:  [98 %-100 %] 100 % (09/12 0300) Weight:  [80.6 kg] 80.6 kg (09/12 0401)      Intake/Output from previous day: 09/11 0701 - 09/12 0700 In: 1705 [P.O.:480; I.V.:1000; IV Piggyback:200] Out: 625 [Urine:600; Blood:25]   Physical Exam:  Cardiovascular: RRR Pulmonary: Clear to auscultation bilaterally Extremities: No lower extremity edema. Wounds: VAC in place left  joint. Drainage in [facility] cannister is sero sanguinous as well as in JP drain   Lab Results: CBC: Recent Labs    08/08/19 1038 08/10/19 0447  WBC 11.1* 8.6  HGB 8.6* 7.9*  HCT 25.8* 23.8*  PLT 431* 402*   BMET:  Recent Labs    08/08/19 1038 08/10/19 0447  NA 135 136  K 3.3* 3.0*  CL 96* 99  CO2 28 27  GLUCOSE 260* 133*  BUN 17 14  CREATININE 1.00 0.86  CALCIUM 7.8* 7.7*    PT/INR: No results for input(s): LABPROT, INR in the last 72 hours. ABG:  INR: Will add last result for INR, ABG once components are confirmed Will add last 4 CBG results once components are confirmed  Assessment/Plan: S/p excisional debridement left chest wound, pulse lavage irrigation, VAC, JP 1. CV - SR in the 80-90's. On Lopressor 12.5 mg bid 2.  Pulmonary - On room air.  3. ID-on Naficillin for MSSA. JP drain has 75 cc of sero sanguinous drainage and patient states nurse took out 20 cc last evening. Will remove JP once output decreases. 4. Wound VAC to be changed Monday  Donielle M ZimmermanPA-C 08/11/2019,9:13  AM [phone]   Chart reviewed, patient examined, agree with above. Would keep JP in until it is dry.

## 2019-08-11 NOTE — Progress Notes (Signed)
Physical Therapy Treatment Patient Details Name: William [name] MRN: [id number] DOB: 1981-08-22 Today's Date: 08/11/2019    History of Present Illness Patient is a 38 y/o male who presents with lower back pain, left shoulder pain, right foot and heel pain. Found to have right heel abscess s/p I&D 9/4, left upper chest wall abscess and mass s/p I&D and wound vac application 9/3, new onset DM, sepsis with MSSA bacteremia due to IV drug use. CT scan- left anterior chest wall soft tissue and intramuscular infection with extension into the thoracic cavity, air in the soft tissues of the right axilla adjacent to the right shoulder, multiple small pulmonary nodules. PMH- heroin IV drug use.    PT Comments    Patient seen for mobility progression. Pt with c/o dizziness with mobility and tolerated short distance gait in room before requesting to return to bed. Pt encouraged to increased time OOB as he had been up in recliner ~40 minutes this am. Continue to progress as tolerated.     Follow Up Recommendations  Home health PT;Supervision for mobility/OOB     Equipment Recommendations  Rolling walker with 5" wheels    Recommendations for Other Services       Precautions / Restrictions Precautions Precautions: Fall Precaution Comments: self limiting, anxious Restrictions Weight Bearing Restrictions: No    Mobility  Bed Mobility Overal bed mobility: Independent Bed Mobility: Sit to Supine           General bed mobility comments: assist to manage lines  Transfers Overall transfer level: Needs assistance Equipment used: Rolling walker (2 wheeled) Transfers: Sit to/from Stand Sit to Stand: Min guard         General transfer comment: min guard for safety; cues for safe hand placement  Ambulation/Gait Ambulation/Gait assistance: Min guard Gait Distance (Feet): 8 Feet Assistive device: Rolling walker (2 wheeled) Gait Pattern/deviations: Step-through pattern Gait velocity: slow    General Gait Details: limited by c/o dizziness; cues to keep eyes open   Stairs             Wheelchair Mobility    Modified Rankin (Stroke Patients Only)       Balance Overall balance assessment: Needs assistance Sitting-balance support: Feet supported;Bilateral upper extremity supported Sitting balance-Leahy Scale: Good     Standing balance support: Bilateral upper extremity supported;During functional activity Standing balance-Leahy Scale: Poor                              Cognition Arousal/Alertness: Awake/alert Behavior During Therapy: Anxious Overall Cognitive Status: Within Functional Limits for tasks assessed                                 General Comments: pt is anxious to mobilize and requires max encouragement to ambulate      Exercises      General Comments        Pertinent Vitals/Pain Pain Assessment: Faces Faces Pain Scale: Hurts a little bit Pain Location: generalized with mobility Pain Descriptors / Indicators: Sore Pain Intervention(s): Limited activity within patient's tolerance;Monitored during session;Repositioned    Home Living                      Prior Function            PT Goals (current goals can now be found in the care plan section) Progress  towards PT goals: Progressing toward goals    Frequency    Min 3X/week      PT Plan Current plan remains appropriate    Co-evaluation              AM-PAC PT "6 Clicks" Mobility   Outcome Measure  Help needed turning from your back to your side while in a flat bed without using bedrails?: None Help needed moving from lying on your back to sitting on the side of a flat bed without using bedrails?: A Little Help needed moving to and from a bed to a chair (including a wheelchair)?: A Little Help needed standing up from a chair using your arms (e.g., wheelchair or bedside chair)?: A Little Help needed to walk in hospital room?: A  Little Help needed climbing 3-5 steps with a railing? : A Lot 6 Click Score: 18    End of Session Equipment Utilized During Treatment: Gait belt Activity Tolerance: Patient tolerated treatment well Patient left: in bed;with call bell/phone within reach;with family/visitor present Nurse Communication: Mobility status PT Visit Diagnosis: Pain;Unsteadiness on feet (R26.81);Muscle weakness (generalized) (M62.81);Difficulty in walking, not elsewhere classified (R26.2) Pain - Right/Left: (bilateral) Pain - part of body: (back)     Time: 1207-1226 PT Time Calculation (min) (ACUTE ONLY): 19 min  Charges:  $Gait Training: 8-22 mins                     William [name], PTA Acute Rehabilitation Services Pager: [phone] Office: [phone]     Darliss Cheney 08/11/2019, 1:09 PM

## 2019-08-12 LAB — BASIC METABOLIC PANEL
Anion gap: 8 (ref 5–15)
BUN: 11 mg/dL (ref 6–20)
CO2: 28 mmol/L (ref 22–32)
Calcium: 7.9 mg/dL — ABNORMAL LOW (ref 8.9–10.3)
Chloride: 101 mmol/L (ref 98–111)
Creatinine, Ser: 0.77 mg/dL (ref 0.61–1.24)
GFR calc Af Amer: >60 mL/min (ref >60)
GFR calc non Af Amer: >60 mL/min (ref >60)
Glucose, Bld: 156 mg/dL — ABNORMAL HIGH (ref 70–99)
Potassium: 3.3 mmol/L — ABNORMAL LOW (ref 3.5–5.1)
Sodium: 137 mmol/L (ref 135–145)

## 2019-08-12 LAB — GLUCOSE, CAPILLARY
Glucose-Capillary: 137 mg/dL — ABNORMAL HIGH (ref 70–99)
Glucose-Capillary: 181 mg/dL — ABNORMAL HIGH (ref 70–99)

## 2019-08-12 LAB — MAGNESIUM: Magnesium: 1.9 mg/dL (ref 1.7–2.4)

## 2019-08-12 MED ORDER — POTASSIUM CHLORIDE CRYS ER 20 MEQ PO TBCR
20.0000 meq | EXTENDED_RELEASE_TABLET | Freq: Every day | ORAL | Status: DC
  Administered 2019-08-12 – 2019-08-15 (×4): 20 meq via ORAL
  Filled 2019-08-12 (×4): qty 1

## 2019-08-12 NOTE — Anesthesia Postprocedure Evaluation (Signed)
Anesthesia Post Note  Patient: William [name]  Procedure(s) Performed: WOUND DEBRIDEMENT (N/A ) WOUND VAC CHANGE (N/A )     Patient location during evaluation: PACU Anesthesia Type: General Level of consciousness: awake and alert Pain management: pain level controlled Vital Signs Assessment: post-procedure vital signs reviewed and stable Respiratory status: spontaneous breathing, nonlabored ventilation, respiratory function stable and patient connected to nasal cannula oxygen Cardiovascular status: blood pressure returned to baseline and stable Postop Assessment: no apparent nausea or vomiting Anesthetic complications: no    Last Vitals:  Vitals:   08/12/19 1545 08/12/19 1910  BP: (!) 147/91 (!) 147/89  Pulse: (!) 102 (!) 108  Resp: 16 15  Temp: 37 C 37.2 C  SpO2: 99% 100%    Last Pain:  Vitals:   08/12/19 1934  TempSrc:   PainSc: 4                  Maritza Goldsborough
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
## 2019-08-12 NOTE — Progress Notes (Addendum)
      [address]       [place] [address]             [phone]       2 Days Post-Op Procedure(s) (LRB): WOUND DEBRIDEMENT (N/A) WOUND VAC CHANGE (N/A)  Subjective: Patient states finally got more than 2 ours of sleep as was given Trazodone.  Objective: Vital signs in last 24 hours: Temp:  [97.5 F (36.4 C)-99.4 F (37.4 C)] 97.5 F (36.4 C) (09/12 1920) Pulse Rate:  [92-106] 106 (09/12 1920) Resp:  [16] 16 (09/12 1920) BP: (133-140)/(85-95) 140/95 (09/12 1920) SpO2:  [99 %-100 %] 100 % (09/12 1920)      Intake/Output from previous day: 09/12 0701 - 09/13 0700 In: 15  Out: -    Physical Exam:  Cardiovascular: RRR Pulmonary: Clear to auscultation bilaterally Extremities: No lower extremity edema. Wounds: VAC in place left [place] joint. Drainage in [facility] cannister is sero sanguinous as well as in JP drain   Lab Results: CBC: Recent Labs    08/10/19 0447 08/11/19 0953  WBC 8.6 7.4  HGB 7.9* 7.6*  HCT 23.8* 22.9*  PLT 402* 444*   BMET:  Recent Labs    08/11/19 0953 08/12/19 0540  NA 137 137  K 2.5* 3.3*  CL 100 101  CO2 29 28  GLUCOSE 131* 156*  BUN 11 11  CREATININE 0.81 0.77  CALCIUM 7.5* 7.9*    PT/INR: No results for input(s): LABPROT, INR in the last 72 hours. ABG:  INR: Will add last result for INR, ABG once components are confirmed Will add last 4 CBG results once components are confirmed  Assessment/Plan: S/p excisional debridement left chest wound, pulse lavage irrigation, VAC, JP 1. CV - SR in the 80-90's. On Lopressor 12.5 mg bid 2.  Pulmonary - On room air.  3. ID-on Naficillin for MSSA. JP drain has 20 cc of sero sanguinous drainage and patient states nurse emptied it earlier. I wrote an order yesterday to record JP output each shift-not recorded this am. Will remove JP once output decreases. 4. Wound VAC to be changed Monday  Donielle M ZimmermanPA-C 08/12/2019,10:17 AM [phone]    Chart reviewed,  patient examined, agree with above. JP drainage is fairly low.May be able to remove in the next day or so.

## 2019-08-13 ENCOUNTER — Ambulatory Visit: Admit: 2019-08-13 | Payer: BC Managed Care – PPO | Admitting: Orthopedic Surgery

## 2019-08-13 ENCOUNTER — Encounter (HOSPITAL_COMMUNITY): Payer: Self-pay | Admitting: Cardiothoracic Surgery

## 2019-08-13 LAB — TYPE AND SCREEN
ABO/RH(D): O POS
Antibody Screen: NEGATIVE
Unit division: 0
Unit division: 0

## 2019-08-13 LAB — SYNOVIAL CELL COUNT + DIFF, W/ CRYSTALS
Crystals, Fluid: NONE SEEN
Eosinophils-Synovial: 0 % (ref 0–1)
Lymphocytes-Synovial Fld: 1 % (ref 0–20)
Monocyte-Macrophage-Synovial Fluid: 2 % — ABNORMAL LOW (ref 50–90)
Neutrophil, Synovial: 97 % — ABNORMAL HIGH (ref 0–25)
WBC, Synovial: 87000 /mm3 — ABNORMAL HIGH (ref 0–200)

## 2019-08-13 LAB — GLUCOSE, CAPILLARY
Glucose-Capillary: 182 mg/dL — ABNORMAL HIGH (ref 70–99)
Glucose-Capillary: 235 mg/dL — ABNORMAL HIGH (ref 70–99)
Glucose-Capillary: 131 mg/dL — ABNORMAL HIGH (ref 70–99)
Glucose-Capillary: 159 mg/dL — ABNORMAL HIGH (ref 70–99)
Glucose-Capillary: 192 mg/dL — ABNORMAL HIGH (ref 70–99)
Glucose-Capillary: 209 mg/dL — ABNORMAL HIGH (ref 70–99)

## 2019-08-13 LAB — BPAM RBC
Blood Product Expiration Date: 202010192359
Blood Product Expiration Date: 202010192359
Unit Type and Rh: 5100
Unit Type and Rh: 5100

## 2019-08-13 SURGERY — IRRIGATION AND DEBRIDEMENT EXTREMITY
Anesthesia: Choice | Laterality: Left

## 2019-08-13 MED ORDER — METHYLPREDNISOLONE ACETATE 40 MG/ML IJ SUSP
40.0000 mg | Freq: Once | INTRAMUSCULAR | Status: AC
  Administered 2019-08-13: 40 mg via INTRA_ARTICULAR
  Filled 2019-08-13: qty 1

## 2019-08-13 MED ORDER — BUPIVACAINE HCL (PF) 0.5 % IJ SOLN
10.0000 mL | Freq: Once | INTRAMUSCULAR | Status: AC
  Administered 2019-08-13: 10 mL
  Filled 2019-08-13 (×2): qty 10

## 2019-08-13 NOTE — Progress Notes (Addendum)
3 Days Post-Op Procedure(s) (LRB): WOUND DEBRIDEMENT (N/A) WOUND VAC CHANGE (N/A) Subjective: Pt seen during Vac change at the bedside this AM. He was anxious about manipulating the wound dressing but overall says pain has been reasonably controlled.  He has remained afebrile.   Objective: Vital signs in last 24 hours: Temp:  [98.1 F (36.7 C)-99.1 F (37.3 C)] 99.1 F (37.3 C) (09/14 0700) Pulse Rate:  [86-108] 86 (09/14 0700) Resp:  [15-18] 18 (09/14 0700) BP: (137-165)/(79-91) 165/79 (09/14 0700) SpO2:  [99 %-100 %] 100 % (09/14 0700)  Intake/Output from previous day: No intake/output data recorded. Intake/Output this shift: No intake/output data recorded.  Physical Exam: General appearance: alert, cooperative and moderate distress Extremities: Improving ROM to right shoulder.  Wound: The vac dressing was removed by the wound care RN. The superficial portion of the wound is dry and granulating. A new wound vac dressing was applied. Only 49ml serous drainage from the left pleural JP.   Lab Results: Recent Labs    08/11/19 0953  WBC 7.4  HGB 7.6*  HCT 22.9*  PLT 444*   BMET:  Recent Labs    08/11/19 0953 08/12/19 0540  NA 137 137  K 2.5* 3.3*  CL 100 101  CO2 29 28  GLUCOSE 131* 156*  BUN 11 11  CREATININE 0.81 0.77  CALCIUM 7.5* 7.9*    PT/INR: No results for input(s): LABPROT, INR in the last 72 hours. ABG    Component Value Date/Time   PHART 7.482 (H) 08/01/2019 0909   HCO3 31.0 (H) 08/01/2019 0909   TCO2 32 08/01/2019 0909   O2SAT 97.0 08/01/2019 0909   CBG (last 3)  Recent Labs    08/11/19 2137 08/12/19 0635 08/12/19 1215  GLUCAP 130* 137* 181*    Assessment/Plan: S/P Procedure(s) (LRB): WOUND DEBRIDEMENT (N/A) WOUND VAC CHANGE (N/A)  -POD-11 incision and drainage of the left [place] joint abscess and subsequent wound vac placement and POD3 excisional debridement and vac change in the OR. Wound noted to communicate with the pleural space.  Minimal drainage from the JP, expect we will be able to remove drain soon. ABX per ID.   -Right shoulder pain and limited ROM-Improved./ Appreciate eval by ortho.    LOS: 12 days    Antony Odea, [place] [phone] 08/13/2019   Plan removal of drain tomorrow. I have seen and examined William [name] and agree with the above assessment  and plan.  Grace Isaac MD [place] [phone] Office [phone] 08/14/2019 7:07 AM

## 2019-08-13 NOTE — Progress Notes (Signed)
The patient refuses to be NPO.

## 2019-08-13 NOTE — Progress Notes (Signed)
PT Cancellation Note  Patient Details Name: William [name] MRN: [id number] DOB: 07-Dec-1980   Cancelled Treatment:    Reason Eval/Treat Not Completed: Patient at procedure or test/unavailable. Entered room and staff indicated patient was receiving sterile procedure. Will re-attempt tomorrow.    Carrah Eppolito 08/13/2019, 3:47 PM

## 2019-08-13 NOTE — Consult Note (Signed)
[place] Nurse wound follow up for left [place] joint NPWT dressing change PA at the bedside to assess wound appearance during dressing change Wound type: full thickness post-op wound Measurement: 2X6X.5cm Wound bed: red and moist Drainage small amt pink drainage, no odor Periwound: intact Dressing procedure/placement/frequency: Pt medicated for pain prior to the procedure and tolerated with mod amt discomfort. One piece of black foam removed from wound bed, wound cleansed with NS. One piece of black foam used to fill defect, covered with drape. Attached to 112mmHg continuous negative pressure and an immediate seal is achieved.  [place] team will change the dressing on Wed.  Julien Girt MSN, RN, [place], [place], [place]
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
## 2019-08-13 NOTE — Plan of Care (Signed)
Progressing towards goals

## 2019-08-13 NOTE — Progress Notes (Signed)
Patient ID: William [name], male   DOB: [birth year], 38 y.o.   MRN: [id number]   LOS: 12 days   Subjective: Pt feels like his shoulder is slightly better but no significant improvement.   Objective: Vital signs in last 24 hours: Temp:  [98.1 F (36.7 C)-99.1 F (37.3 C)] 99.1 F (37.3 C) (09/14 0700) Pulse Rate:  [86-108] 86 (09/14 0700) Resp:  [15-18] 18 (09/14 0700) BP: (137-165)/(79-91) 165/79 (09/14 0700) SpO2:  [99 %-100 %] 100 % (09/14 0700) Last BM Date: 08/11/19   Laboratory  CBC Recent Labs    08/11/19 0953  WBC 7.4  HGB 7.6*  HCT 22.9*  PLT 444*   BMET Recent Labs    08/11/19 0953 08/12/19 0540  NA 137 137  K 2.5* 3.3*  CL 100 101  CO2 29 28  GLUCOSE 131* 156*  BUN 11 11  CREATININE 0.81 0.77  CALCIUM 7.5* 7.9*     Physical Exam General appearance: alert and no distress  RUE: No change in appearance or exam. Pain with active abd and flex when he gets high enough.   Assessment/Plan: Right shoulder pain -- Will aspirate SA space and send fluid for analysis    Lisette Abu, PA-C Orthopedic Surgery [phone] 08/13/2019

## 2019-08-13 NOTE — Procedures (Signed)
Procedure: Right shoulder subacromial aspiration and injection  Indication: Right shoulder pain  Surgeon: Silvestre Gunner, PA-C  Assist: None  Anesthesia: Gebauers  EBL: None  Complications: None  Findings: After risks/benefits explained patient desires to undergo procedure. Consent obtained and time out performed. The right shoulder was sterilely prepped and the subacromial space was aspirated. 34ml cloudy yellow fluid obtained. 16ml 0.5% Marcaine instilled. Pt tolerated the procedure well.    Lisette Abu, PA-C Orthopedic Surgery [phone]

## 2019-08-14 ENCOUNTER — Inpatient Hospital Stay (HOSPITAL_COMMUNITY): Payer: BC Managed Care – PPO

## 2019-08-14 DIAGNOSIS — Z4682 Encounter for fitting and adjustment of non-vascular catheter: Secondary | ICD-10-CM | POA: Diagnosis not present

## 2019-08-14 DIAGNOSIS — R7881 Bacteremia: Secondary | ICD-10-CM | POA: Diagnosis not present

## 2019-08-14 LAB — GLUCOSE, CAPILLARY
Glucose-Capillary: 117 mg/dL — ABNORMAL HIGH (ref 70–99)
Glucose-Capillary: 219 mg/dL — ABNORMAL HIGH (ref 70–99)
Glucose-Capillary: 217 mg/dL — ABNORMAL HIGH (ref 70–99)
Glucose-Capillary: 106 mg/dL — ABNORMAL HIGH (ref 70–99)

## 2019-08-14 LAB — BASIC METABOLIC PANEL
Anion gap: 8 (ref 5–15)
BUN: 8 mg/dL (ref 6–20)
CO2: 29 mmol/L (ref 22–32)
Calcium: 7.7 mg/dL — ABNORMAL LOW (ref 8.9–10.3)
Chloride: 103 mmol/L (ref 98–111)
Creatinine, Ser: 0.71 mg/dL (ref 0.61–1.24)
GFR calc Af Amer: >60 mL/min (ref >60)
GFR calc non Af Amer: >60 mL/min (ref >60)
Glucose, Bld: 120 mg/dL — ABNORMAL HIGH (ref 70–99)
Potassium: 2.7 mmol/L — CL (ref 3.5–5.1)
Sodium: 140 mmol/L (ref 135–145)

## 2019-08-14 LAB — CULTURE, BLOOD (ROUTINE X 2)
Culture: NO GROWTH
Culture: NO GROWTH
Special Requests: ADEQUATE

## 2019-08-14 MED ORDER — POTASSIUM CHLORIDE CRYS ER 20 MEQ PO TBCR
40.0000 meq | EXTENDED_RELEASE_TABLET | ORAL | Status: AC
  Administered 2019-08-14 (×2): 40 meq via ORAL

## 2019-08-14 NOTE — Progress Notes (Signed)
Physical Therapy Treatment Patient Details Name: William [name] MRN: [id number] DOB: September 28, 1981 Today's Date: 08/14/2019    History of Present Illness Patient is a 38 y/o male who presents with lower back pain, left shoulder pain, right foot and heel pain. Found to have right heel abscess s/p I&D 9/4, left upper chest wall abscess and mass s/p I&D and wound vac application 9/3, new onset DM, sepsis with MSSA bacteremia due to IV drug use. CT scan- left anterior chest wall soft tissue and intramuscular infection with extension into the thoracic cavity, air in the soft tissues of the right axilla adjacent to the right shoulder, multiple small pulmonary nodules. PMH- heroin IV drug use.    PT Comments    Pt motivated to work with PT today. Min A for bed mobility. Practiced sit<>stand without AD as well standing balance without AD. Pt ambulated 250' with RW and min guard A. Continued to encourage him to sit up for more of the day than he is supine as he continues to get dizzy with initial sitting and standing. PT will continue to follow.   Follow Up Recommendations  Home health PT;Supervision for mobility/OOB     Equipment Recommendations  Rolling walker with 5" wheels    Recommendations for Other Services       Precautions / Restrictions Precautions Precautions: Fall Precaution Comments: can be self limiting, anxious Restrictions Weight Bearing Restrictions: No    Mobility  Bed Mobility Overal bed mobility: Needs Assistance Bed Mobility: Supine to Sit     Supine to sit: Min assist     General bed mobility comments: min HHA to initiate trunk elevation but then pt able to position self EOB  Transfers Overall transfer level: Needs assistance Equipment used: Rolling walker (2 wheeled) Transfers: Sit to/from Stand Sit to Stand: Min guard         General transfer comment: pt initially dizzy but decreased with time up. Had pt practice sit<>stand without RW and bed at lowest  position. Took increased time but pt was able to achieve standing without physical assist. Min-guard to RW  Ambulation/Gait Ambulation/Gait assistance: Min guard Gait Distance (Feet): 250 Feet Assistive device: Rolling walker (2 wheeled) Gait Pattern/deviations: Step-through pattern;Decreased stride length Gait velocity: decreased Gait velocity interpretation: 1.31 - 2.62 ft/sec, indicative of limited community ambulator General Gait Details: cues to lighten up on RW in preparation for no AD.    Stairs             Wheelchair Mobility    Modified Rankin (Stroke Patients Only)       Balance Overall balance assessment: Needs assistance Sitting-balance support: Feet supported;Bilateral upper extremity supported Sitting balance-Leahy Scale: Good     Standing balance support: During functional activity;No upper extremity supported Standing balance-Leahy Scale: Fair Standing balance comment: able to maintain static stance without UE support                            Cognition Arousal/Alertness: Awake/alert Behavior During Therapy: WFL for tasks assessed/performed Overall Cognitive Status: Within Functional Limits for tasks assessed                                        Exercises      General Comments General comments (skin integrity, edema, etc.): pt encouraged to sit up in chair after session and was agreeable  Pertinent Vitals/Pain Pain Assessment: Faces Faces Pain Scale: Hurts a little bit Pain Location: generalized with mobility Pain Descriptors / Indicators: Sore Pain Intervention(s): Monitored during session    Home Living                      Prior Function            PT Goals (current goals can now be found in the care plan section) Acute Rehab PT Goals Patient Stated Goal: be with his girlfriend PT Goal Formulation: With patient Time For Goal Achievement: 08/17/19 Potential to Achieve Goals:  Good Progress towards PT goals: Progressing toward goals    Frequency    Min 3X/week      PT Plan Current plan remains appropriate    Co-evaluation              AM-PAC PT "6 Clicks" Mobility   Outcome Measure  Help needed turning from your back to your side while in a flat bed without using bedrails?: None Help needed moving from lying on your back to sitting on the side of a flat bed without using bedrails?: A Little Help needed moving to and from a bed to a chair (including a wheelchair)?: A Little Help needed standing up from a chair using your arms (e.g., wheelchair or bedside chair)?: A Little Help needed to walk in hospital room?: A Little Help needed climbing 3-5 steps with a railing? : A Lot 6 Click Score: 18    End of Session Equipment Utilized During Treatment: Gait belt Activity Tolerance: Patient tolerated treatment well Patient left: with call bell/phone within reach;in chair Nurse Communication: Mobility status PT Visit Diagnosis: Pain;Unsteadiness on feet (R26.81);Muscle weakness (generalized) (M62.81);Difficulty in walking, not elsewhere classified (R26.2) Pain - Right/Left: (bilateral) Pain - part of body: Shoulder(back)     Time: 6387-5643 PT Time Calculation (min) (ACUTE ONLY): 18 min  Charges:  $Gait Training: 8-22 mins                     Leighton Roach, [place]  Pager [phone] Office [place] 08/14/2019, 1:26 PM
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
## 2019-08-14 NOTE — Progress Notes (Addendum)
4 Days Post-Op Procedure(s) (LRB): WOUND DEBRIDEMENT (N/A) WOUND VAC CHANGE (N/A) Subjective: Awake and alert, pain controlled.  No new concerns.  Objective: Vital signs in last 24 hours: Temp:  [98 F (36.7 C)-98.4 F (36.9 C)] 98.3 F (36.8 C) (09/14 2000) Pulse Rate:  [84-96] 84 (09/15 0100) Cardiac Rhythm: Normal sinus rhythm (09/14 0958) Resp:  [18] 18 (09/15 0100) BP: (135-144)/(87-92) 135/87 (09/15 0100) SpO2:  [100 %] 100 % (09/15 0100)   Intake/Output from previous day: 09/14 0701 - 09/15 0700 In: 2449.2 [P.O.:1050; I.V.:899.2; IV Piggyback:500] Out: 400 [Urine:400] Intake/Output this shift: No intake/output data recorded.  Physical Exam: General appearance:alert, cooperative and no distress Wound:The vac is intact and functioning appropriately. Drainage in cannister is thin, serosanguinous. Volume not recorded.  Approximately 50ml serous drainage from the left pleural JP over the past 24 hours.   Lab Results: Recent Labs    08/11/19 0953  WBC 7.4  HGB 7.6*  HCT 22.9*  PLT 444*   BMET:  Recent Labs    08/12/19 0540 08/14/19 0500  NA 137 140  K 3.3* 2.7*  CL 101 103  CO2 28 29  GLUCOSE 156* 120*  BUN 11 8  CREATININE 0.77 0.71  CALCIUM 7.9* 7.7*    PT/INR: No results for input(s): LABPROT, INR in the last 72 hours. ABG    Component Value Date/Time   PHART 7.482 (H) 08/01/2019 0909   HCO3 31.0 (H) 08/01/2019 0909   TCO2 32 08/01/2019 0909   O2SAT 97.0 08/01/2019 0909   CBG (last 3)  Recent Labs    08/13/19 1702 08/13/19 2139 08/14/19 0620  GLUCAP 192* 209* 117*    Assessment/Plan: S/P Procedure(s) (LRB): WOUND DEBRIDEMENT (N/A) WOUND VAC CHANGE (N/A)  -POD-12 incision and drainage of the left [place] joint abscess and subsequent wound vac placement and POD4 excisional debridement and vac change in the OR. Wound examined during vac change yesterday and appeared clean and was granulating.  Minimal drainage from the JP will remove drain  today if CXR shows no significant accumulation of left pleural fluid.  ABX per ID.   -Right shoulder pain and limited ROM-Improved. Evaluated by ortho. Right shoulder joint aspirated yesterday. Gram stain shows moderate WBC's but no organisms.   -Anemia, hypokalemia--mgt per primary team.    LOS: 13 days    William Odea, PA-C [phone] 08/14/2019  Dg Chest 2 View  Result Date: 08/14/2019 CLINICAL DATA:  Status post chest tube removal. EXAM: CHEST - 2 VIEW COMPARISON:  Radiographs of August 10, 2019. FINDINGS: The heart size and mediastinal contours are within normal limits. Endotracheal tube has been removed. Right lung is clear. No pneumothorax or pleural effusion is noted. Left-sided drainage catheter is unchanged in position. No definite consolidative process is noted. The visualized skeletal structures are unremarkable. IMPRESSION: No active cardiopulmonary disease. Stable position of left-sided drainage catheter. Endotracheal tube has been removed. Electronically Signed   By: Marijo Conception M.D.   On: 08/14/2019 10:21   I have seen and examined William [name] and agree with the above assessment  and plan.  Grace Isaac MD Beeper [phone] Office [phone] 08/14/2019 5:19 PM
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
## 2019-08-15 LAB — BASIC METABOLIC PANEL
Anion gap: 11 (ref 5–15)
BUN: 8 mg/dL (ref 6–20)
CO2: 28 mmol/L (ref 22–32)
Calcium: 7.9 mg/dL — ABNORMAL LOW (ref 8.9–10.3)
Chloride: 98 mmol/L (ref 98–111)
Creatinine, Ser: 0.64 mg/dL (ref 0.61–1.24)
GFR calc Af Amer: >60 mL/min (ref >60)
GFR calc non Af Amer: >60 mL/min (ref >60)
Glucose, Bld: 186 mg/dL — ABNORMAL HIGH (ref 70–99)
Potassium: 2.9 mmol/L — ABNORMAL LOW (ref 3.5–5.1)
Sodium: 137 mmol/L (ref 135–145)

## 2019-08-15 LAB — CBC WITH DIFFERENTIAL/PLATELET
Abs Immature Granulocytes: 0.03 10*3/uL (ref 0.00–0.07)
Basophils Absolute: 0 10*3/uL (ref 0.0–0.1)
Basophils Relative: 0 %
Eosinophils Absolute: 0.1 10*3/uL (ref 0.0–0.5)
Eosinophils Relative: 1 %
HCT: 24 % — ABNORMAL LOW (ref 39.0–52.0)
Hemoglobin: 8 g/dL — ABNORMAL LOW (ref 13.0–17.0)
Immature Granulocytes: 0 %
Lymphocytes Relative: 11 %
Lymphs Abs: 0.7 10*3/uL (ref 0.7–4.0)
MCH: 31.4 pg (ref 26.0–34.0)
MCHC: 33.3 g/dL (ref 30.0–36.0)
MCV: 94.1 fL (ref 80.0–100.0)
Monocytes Absolute: 0.4 10*3/uL (ref 0.1–1.0)
Monocytes Relative: 5 %
Neutro Abs: 5.6 10*3/uL (ref 1.7–7.7)
Neutrophils Relative %: 83 %
Platelets: 446 10*3/uL — ABNORMAL HIGH (ref 150–400)
RBC: 2.55 MIL/uL — ABNORMAL LOW (ref 4.22–5.81)
RDW: 16.3 % — ABNORMAL HIGH (ref 11.5–15.5)
WBC: 6.7 10*3/uL (ref 4.0–10.5)
nRBC: 0 % (ref 0.0–0.2)

## 2019-08-15 LAB — GLUCOSE, CAPILLARY
Glucose-Capillary: 147 mg/dL — ABNORMAL HIGH (ref 70–99)
Glucose-Capillary: 133 mg/dL — ABNORMAL HIGH (ref 70–99)
Glucose-Capillary: 133 mg/dL — ABNORMAL HIGH (ref 70–99)
Glucose-Capillary: 191 mg/dL — ABNORMAL HIGH (ref 70–99)

## 2019-08-15 LAB — PHOSPHORUS: Phosphorus: 3.7 mg/dL (ref 2.5–4.6)

## 2019-08-15 LAB — MAGNESIUM: Magnesium: 1.8 mg/dL (ref 1.7–2.4)

## 2019-08-15 MED ORDER — PRO-STAT SUGAR FREE PO LIQD
30.0000 mL | Freq: Every day | ORAL | Status: DC
  Administered 2019-08-16 – 2019-08-17 (×2): 30 mL via ORAL
  Filled 2019-08-15 (×6): qty 30

## 2019-08-15 NOTE — Progress Notes (Addendum)
Patient voices that he has an order for staffs not to bother him from the hours of 2200 to 0600. RN checked active orders and see none. I reviewed the orders with patient and expressed that I per hospital policy, rounding and VS are part of keeping him safe in the hospital. I verbalize with patient that I will try to cluster my care so that he wont be disturb. He verbalizes understanding.  Patient states that his wound on his right heel is healed. RN check and confirmed. NO need for dressing change at this time.   Ave Filter, RN

## 2019-08-15 NOTE — Progress Notes (Signed)
Patient ID: William [name], male   DOB: 06-01-1981, 38 y.o.   MRN: [id number]   LOS: 14 days   Subjective: Not much change today.   Objective: Vital signs in last 24 hours: Temp:  [98.2 F (36.8 C)-99.6 F (37.6 C)] 98.2 F (36.8 C) (09/16 0900) Pulse Rate:  [87-98] 87 (09/15 2055) Resp:  [18] 18 (09/15 2055) BP: (132-142)/(84-90) 132/88 (09/16 0900) SpO2:  [100 %] 100 % (09/15 2055) Weight:  [77.1 kg] 77.1 kg (09/16 0500) Last BM Date: 08/14/19   Laboratory  CBC No results for input(s): WBC, HGB, HCT, PLT in the last 72 hours. BMET Recent Labs    08/14/19 0500  NA 140  K 2.7*  CL 103  CO2 29  GLUCOSE 120*  BUN 8  CREATININE 0.71  CALCIUM 7.7*     Physical Exam General appearance: alert and no distress  LUE: Limited AROM shoulder approx unchanged, mild pain associated   Assessment/Plan: Left shoulder pain -- At this point would continue PT/OT for strengthening/mobility. Would not plan to surgically intervene unless clinical picture worsens or culture ends up being positive.    Lisette Abu, PA-C Orthopedic Surgery [phone] 08/15/2019

## 2019-08-15 NOTE — Progress Notes (Signed)
5 Days Post-Op Procedure(s) (LRB): WOUND DEBRIDEMENT (N/A) WOUND VAC CHANGE (N/A) Subjective: Reported a new area of pain at his left lateral chest wall. Otherwise feeling better.  Remains afebrile.  Objective: Vital signs in last 24 hours: Temp:  [98.2 F (36.8 C)-99.6 F (37.6 C)] 98.2 F (36.8 C) (09/16 0900) Pulse Rate:  [85-98] 87 (09/15 2055) Cardiac Rhythm: Normal sinus rhythm (09/15 2000) Resp:  [18] 18 (09/15 2055) BP: (132-142)/(79-90) 132/88 (09/16 0900) SpO2:  [100 %] 100 % (09/15 2055) Weight:  [77.1 kg] 77.1 kg (09/16 0500)    Intake/Output from previous day: 09/15 0701 - 09/16 0700 In: 185 [P.O.:222; I.V.:217; IV Piggyback:500] Out: 6314 [Urine:1015; Drains:100] Intake/Output this shift: No intake/output data recorded.  Physical Exam: General appearance:alert, cooperative and no distress Wound:The vac is intact and functioning appropriately. Drainage in cannister is thin, serosanguinous fluid with 163ml recorded over past 24 hours. The left pleural JP was removed yesterday. The exit site is covered with a dry dressing.   Lab Results: No results for input(s): WBC, HGB, HCT, PLT in the last 72 hours. BMET:  Recent Labs    08/14/19 0500  NA 140  K 2.7*  CL 103  CO2 29  GLUCOSE 120*  BUN 8  CREATININE 0.71  CALCIUM 7.7*    PT/INR: No results for input(s): LABPROT, INR in the last 72 hours. ABG    Component Value Date/Time   PHART 7.482 (H) 08/01/2019 0909   HCO3 31.0 (H) 08/01/2019 0909   TCO2 32 08/01/2019 0909   O2SAT 97.0 08/01/2019 0909   CBG (last 3)  Recent Labs    08/14/19 1101 08/14/19 1652 08/14/19 2156  GLUCAP 219* 217* 106*    Assessment/Plan: S/P Procedure(s) (LRB): WOUND DEBRIDEMENT (N/A) WOUND VAC CHANGE (N/A)  -POD-13incision and drainage of the left [place] joint abscess and subsequent wound vac placement and POD5 excisional debridement and vac change in the OR. Wound examined during vac change 08/14/19 and appeared clean  and was granulating.   ABX per ID. We will continue to follow.   -Right shoulder pain and limited ROM-Improved. Evaluated by ortho. Right shoulder joint aspirated 08/13/19. Gram stain shows moderate WBC's but no organisms. Cx with no growth at 2 days.   -Anemia, hypokalemia--mgt per primary team.    LOS: 14 days    Antony Odea, PA-C [phone] 08/15/2019

## 2019-08-15 NOTE — Progress Notes (Signed)
Physical Therapy Treatment Patient Details Name: William [name] MRN: [id number] DOB: [birth year] Today's Date: 08/15/2019    History of Present Illness Patient is a 38 y/o male who presents with lower back pain, left shoulder pain, right foot and heel pain. Found to have right heel abscess s/p I&D 9/4, left upper chest wall abscess and mass s/p I&D and wound vac application 9/3, new onset DM, sepsis with MSSA bacteremia due to IV drug use. CT scan- left anterior chest wall soft tissue and intramuscular infection with extension into the thoracic cavity, air in the soft tissues of the right axilla adjacent to the right shoulder, multiple small pulmonary nodules. PMH- heroin IV drug use.    PT Comments    Patient initially is enthusiastic and agrees to work with me, but wanted to finish eating. When I returned I assisted patient to [facility] for BM and after he declined ambulating further due to pain in his side from tube being removed this morning. Despite encouragement patient declines all other activity. Patient will continue to benefit from skilled PT while here to improve independence with mobility and improve safety.      Follow Up Recommendations  Home health PT;Supervision for mobility/OOB     Equipment Recommendations  Rolling walker with 5" wheels    Recommendations for Other Services       Precautions / Restrictions Precautions Precautions: Fall Precaution Comments: can be self limiting, anxious Restrictions Weight Bearing Restrictions: No    Mobility  Bed Mobility Overal bed mobility: Needs Assistance Bed Mobility: Sit to Supine;Sidelying to Sit;Sit to Sidelying   Sidelying to sit: HOB elevated;Mod assist   Sit to supine: Modified independent (Device/Increase time);HOB elevated Sit to sidelying: Min assist;HOB elevated General bed mobility comments: min HHA to initiate trunk elevation but then pt able to position self EOB  Transfers Overall transfer level: Needs  assistance Equipment used: None Transfers: Sit to/from Stand Sit to Stand: Min assist Stand pivot transfers: Min assist       General transfer comment: Min assist to ambulate a few steps from bed to [facility] and back to bed.  Ambulation/Gait Ambulation/Gait assistance: Min assist Gait Distance (Feet): 3 Feet Assistive device: None Gait Pattern/deviations: Step-to pattern Gait velocity: decreased   General Gait Details: very cautious, pain with activity   Stairs             Wheelchair Mobility    Modified Rankin (Stroke Patients Only)       Balance Overall balance assessment: Needs assistance Sitting-balance support: Feet supported Sitting balance-Leahy Scale: Good     Standing balance support: During functional activity;Single extremity supported Standing balance-Leahy Scale: Fair Standing balance comment: able to maintain static stance without UE support, close supervision for safety                            Cognition Arousal/Alertness: Awake/alert Behavior During Therapy: WFL for tasks assessed/performed Overall Cognitive Status: Within Functional Limits for tasks assessed                               Problem Solving: Decreased initiation General Comments: patient initially said he likes when we come and agreed to walk, but wanted to finish eating and requested that I come back. When i came back he needed to have BM, then declined further ambulation. Despite encouragement.      Exercises  General Comments        Pertinent Vitals/Pain Pain Assessment: Faces Faces Pain Scale: Hurts even more Pain Location: side is sore, reports tube was removed today. Pain Descriptors / Indicators: Sore Pain Intervention(s): Monitored during session    Home Living                      Prior Function            PT Goals (current goals can now be found in the care plan section) Acute Rehab PT Goals Patient Stated Goal: be  with his girlfriend PT Goal Formulation: With patient Time For Goal Achievement: 08/17/19 Potential to Achieve Goals: Good Progress towards PT goals: Progressing toward goals    Frequency    Min 3X/week      PT Plan Current plan remains appropriate    Co-evaluation              AM-PAC PT "6 Clicks" Mobility   Outcome Measure  Help needed turning from your back to your side while in a flat bed without using bedrails?: None Help needed moving from lying on your back to sitting on the side of a flat bed without using bedrails?: A Little Help needed moving to and from a bed to a chair (including a wheelchair)?: A Little Help needed standing up from a chair using your arms (e.g., wheelchair or bedside chair)?: A Little Help needed to walk in hospital room?: A Little Help needed climbing 3-5 steps with a railing? : A Lot 6 Click Score: 18    End of Session   Activity Tolerance: Patient limited by pain Patient left: in bed;with call bell/phone within reach;with family/visitor present Nurse Communication: Mobility status PT Visit Diagnosis: Unsteadiness on feet (R26.81);Muscle weakness (generalized) (M62.81);Difficulty in walking, not elsewhere classified (R26.2);Pain Pain - Right/Left: Left(side from where tube was removed earlier (left))     Time: 1345-1400 PT Time Calculation (min) (ACUTE ONLY): 15 min  Charges:  $Therapeutic Activity: 8-22 mins                     Melisia Leming, PT, GCS 08/15/19,2:19 PM

## 2019-08-15 NOTE — Consult Note (Addendum)
[place] Nurse wound follow upfor left [place] joint NPWT dressing change Wound type: full thickness post-op wound Measurement: 1.5X6X.5cm, slowly decreasing in size Wound bed: red and moist Drainage small amt pink drainage, no odor Periwound: intact Dressing procedure/placement/frequency:Pt medicated for pain prior to the procedure and tolerated with mod amt discomfort. One piece of black foam removed from wound bed, wound cleansed with NS. One piece of black foam used to fill defect, covered with drape. Attached to 174mmHg continuous negative pressure and an immediate seal is achieved.  [place] team will change the dressing on Fri.  Julien Girt MSN, RN, [place], [place], [place]

## 2019-08-15 NOTE — Progress Notes (Signed)
PROGRESS NOTE  William [name]  DOB: [birth year]  PCP: Patient, No Pcp Per EVO:[id number]  DOA: 07/31/2019  LOS: 14 days   Brief narrative: 38 year old no previous medical history, heroin injection for last 10 years presented to emergency room on 9/1 with multiple complaints including lower back pain, left shoulder pain, right foot and heel pain. In the emergency room, he has right heel abscess, left upper chest wall abscess and mass, CT scan noted left anterior chest wall soft tissue and intramuscular infection with extension into the thoracic cavity, air in the soft tissues of the right axilla adjacent to the right shoulder, multiple small pulmonary nodules. Patient was admitted to ICU. Underwent surgical debridement of the chest wall abscess and wound VAC placement. Also found to have new diagnosis of diabetes. MRI showed multiple epidural and paraspinal abscesses. Blood cultures 9/2: MSSA Blood cultures 9/3: no growth so far. Wound culture 9/2: MSSA. Right heel culture 9/3: Abundant WBC. MSSA. Repeat c/s/ : 9/3,9/6 negative so far.   Subjective: Patient was seen and examined this morning.  No overnight events.  Afebrile.  Assessment/Plan: Sepsis present on admission with MSSA bacteremia due to IV drug use: Septic pulmonary emboli, Left chest wall abscess, Right heel abscess, Extensive right posterolateral epidural abscess L2-L3, Osteomyelitis of the L5-S1, Posterior epidural abscess L5-S1 S2. - 9/11, patient underwent incision and drainage and debridement left chest wall on wound VAC, followed by CT CS. -Bedside I&D right heel, MSSA. -Multiple epidural abscess and discitis, seen by neurosurgery, recommended no surgical drainage but antibiotics. - followed by infectious disease. He was on cefazolin and metronidazole. Now remains on IV nafcillin. Repeat cultures were negative. - Intraoperative TEE with no evidence of endocarditis. - Anticipate intravenous antibiotics for about 6 to  8 weeks.   Right shoulder septic bursitis 9/10, patient complained of stiffness of right shoulder.  CT scan showed septic bursitis without evidence of septic arthritis or osteomyelitis.  Orthopedic consult appreciated.  Recommended to continue IV antibiotics.  Also underwent arthrocentesis on 9/14.  Fluid analysis showed high WBC count 87,000 without bacteria.  Continue IV antibiotics.  IV drug EXH:BZJIRCV opiate use. Patient is motivated to quit. Continue provide symptomatic treatment. Opiates for pain relief for postoperative pain. Continue Tylenol and muscle relaxants. Used metoprolol for tachycardia. Patient to use Xanax for craving and anxiety.  New onset diabetes:A1c 7.8. Started on insulin. Stabilizing on increased dose of insulin.  Currently on Levemir to 20 units and prandial insulin.  Hypokalemia-persistently low.  Replaced.  Will recheck level today.  Also check magnesium.    Insomnia -improving with Remeron at bedtime.  Mobility: Encourage ambulation Diet: Diabetic diet DVT prophylaxis: Heparin subcu Code Status: Full Code  Family Communication:None. Expected Discharge: Continue inpatient management  Consultants:  PCCM, transfer out  CT CS, following  Neurosurgery, signed off  Orthopedics, signed off  Procedures:  Chest wall abscess incision and drainage, 08/02/2019  Right heel incision and drainage, 08/03/2019  Antimicrobials:   Vancomycin, 08/01/2019>>> 08/02/2019  Cefepime, 08/01/2019>>> 08/02/2019  Cefazolin, 08/02/2019>>>>> 08/06/2019  Flagyl, 08/02/2019>>> 08/06/2019  Diflucan, on 08/03/2019 one dose  Nystatin swish and swallow, 08/03/2019 >>>  Nafcillin 08/06/2019>>>>  Infusions:  . lactated ringers 10 mL/hr at 08/10/19 0845  . nafcillin IV 2 g (08/15/19 0950)    Scheduled Meds: . Chlorhexidine Gluconate Cloth  6 each Topical Daily  . docusate sodium  100 mg Oral Daily  . feeding supplement (GLUCERNA SHAKE)  237 mL Oral TID BM  . heparin   5,000  Units Subcutaneous Q8H  . insulin aspart  0-15 Units Subcutaneous TID WC  . insulin aspart  0-5 Units Subcutaneous QHS  . insulin aspart  5 Units Subcutaneous TID WC  . insulin glargine  20 Units Subcutaneous Daily  . Melatonin  6 mg Oral Once  . metoprolol tartrate  12.5 mg Oral BID  . nystatin  5 mL Oral QID  . ondansetron  4 mg Intravenous Once  . potassium chloride  20 mEq Oral Daily  . senna-docusate  1 tablet Oral BID  . sodium chloride flush  10-40 mL Intracatheter Q12H    PRN meds: acetaminophen, ALPRAZolam, calcium carbonate, ondansetron (ZOFRAN) IV, oxyCODONE, promethazine, sodium chloride flush, traZODone   Objective: Vitals:   08/15/19 0900 08/15/19 1259  BP: 132/88 (!) 144/79  Pulse:  86  Resp:  16  Temp: 98.2 F (36.8 C) 98.9 F (37.2 C)  SpO2:  99%    Intake/Output Summary (Last 24 hours) at 08/15/2019 1412 Last data filed at 08/15/2019 0400 Gross per 24 hour  Intake 717 ml  Output 1115 ml  Net -398 ml   Filed Weights   08/10/19 0433 08/11/19 0401 08/15/19 0500  Weight: 78.6 kg 80.6 kg 77.1 kg   Weight change:  Body mass index is 21.82 kg/m.   Physical Exam: General exam: Appears calm and comfortable.  He is playing music. Skin: No rashes, lesions or ulcers. HEENT: Atraumatic, normocephalic, supple neck, no obvious bleeding Lungs: Clear to auscultate bilaterally.  Left chest wall wound VAC with thin fluid in the canister. CVS: Regular rate and rhythm, no murmur GI/Abd soft, nontender, nondistended, bowel sound present CNS: Alert, awake, oriented x3 Psychiatry: Mood appropriate. Extremities: No pedal edema, no calf tenderness  Data Review: I have personally reviewed the laboratory data and studies available.  Recent Labs  Lab 08/10/19 0447 08/11/19 0953  WBC 8.6 7.4  NEUTROABS 7.0 6.3  HGB 7.9* 7.6*  HCT 23.8* 22.9*  MCV 92.6 92.3  PLT 402* 444*   Recent Labs  Lab 08/10/19 0447 08/11/19 0953 08/12/19 0540 08/14/19 0500  NA  136 137 137 140  K 3.0* 2.5* 3.3* 2.7*  CL 99 100 101 103  CO2 27 29 28 29   GLUCOSE 133* 131* 156* 120*  BUN 14 11 11 8   CREATININE 0.86 0.81 0.77 0.71  CALCIUM 7.7* 7.5* 7.9* 7.7*  MG 1.9  --  1.9  --   PHOS 3.7  --   --   --     Total time spent: 30 minutes for direct patient care.  Dorcas CarrowKuber Rebie Peale, MD  [facility] 08/15/2019

## 2019-08-15 NOTE — Progress Notes (Signed)
Nutrition Follow-up  DOCUMENTATION CODES:   Not applicable  INTERVENTION:  Continue Glucerna Shake po TID, each supplement provides 220 kcal and 10 grams of protein.  Provide 30 ml Prostat po once daily, each supplement provides 100 kcal and 15 grams of protein.   Encourage adequate PO intake.   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 Pt with sepsis present on admission with MSSA bacteremia due to IV drug use. Septic pulmonary emboli, Left chest wall abscess, Right heel abscess, Extensive right posterolateral epidural abscess L2-L3, Osteomyelitis of the L5-S1, Posterior epidural abscess L5-S1 S2. Status post incision and drainage and debridement left chest wall on wound VAC9/3. S/p R heel I&D 9/4.   Procedure (9/11): wound debridement and wound VAC change  Meal completion has been 100%. Pt currently has Glucerna shake ordered and has been consuming them. RD to continue with current orders and additionally order Prostat to aid in healing.   Labs and medications reviewed.   Diet Order:   Diet Order            Diet regular Room service appropriate? Yes; Fluid consistency: Thin  Diet effective now              EDUCATION NEEDS:   Education needs have been addressed  Skin:  Skin Assessment: Skin Integrity Issues: Skin Integrity Issues:: Wound VAC, Incisions Wound Vac: L chest Incisions: L chest, R ankle  Last BM:  9/15  Height:   Ht Readings from Last 1 Encounters:   08/01/19 '6\' 2"'  (1.88 m)    Weight:   Wt Readings from Last 1 Encounters:  08/15/19 77.1 kg    Ideal Body Weight:  86.36 kg  BMI:  Body mass index is 21.82 kg/m.  Estimated Nutritional Needs:   Kcal:  2150-2350  Protein:  105-120 grams  Fluid:  >/= 2.1 L/day    Corrin Parker, MS, RD, LDN Pager # [phone] After hours/ weekend pager # [phone]

## 2019-08-15 NOTE — Progress Notes (Signed)
Paged NP regarding no AM labs. K was 2.7 yesterday and he received replacement. Awaiting return page.

## 2019-08-15 NOTE — Progress Notes (Signed)
Chaplain visited to follow-up on last visit.  [name] enjoys someone to talk to about the things going on in their life.  Chaplain visited with patient and the patient requested the chaplain follow-up.  Chaplain will continue to visit William [name] as they are available.  Brion Aliment Chaplain Resident For questions concerning this note please contact me by pager [phone]

## 2019-08-16 LAB — GLUCOSE, CAPILLARY
Glucose-Capillary: 137 mg/dL — ABNORMAL HIGH (ref 70–99)
Glucose-Capillary: 117 mg/dL — ABNORMAL HIGH (ref 70–99)
Glucose-Capillary: 183 mg/dL — ABNORMAL HIGH (ref 70–99)
Glucose-Capillary: 134 mg/dL — ABNORMAL HIGH (ref 70–99)

## 2019-08-16 LAB — BODY FLUID CULTURE: Culture: NO GROWTH

## 2019-08-16 MED ORDER — POTASSIUM CHLORIDE CRYS ER 20 MEQ PO TBCR
20.0000 meq | EXTENDED_RELEASE_TABLET | Freq: Two times a day (BID) | ORAL | Status: DC
  Administered 2019-08-16 – 2019-08-17 (×4): 20 meq via ORAL
  Filled 2019-08-16 (×4): qty 1

## 2019-08-16 MED ORDER — MAGNESIUM OXIDE 400 (241.3 MG) MG PO TABS
400.0000 mg | ORAL_TABLET | Freq: Two times a day (BID) | ORAL | Status: DC
  Administered 2019-08-16 – 2019-10-03 (×97): 400 mg via ORAL
  Filled 2019-08-16 (×98): qty 1

## 2019-08-16 MED ORDER — ENOXAPARIN SODIUM 40 MG/0.4ML ~~LOC~~ SOLN
40.0000 mg | SUBCUTANEOUS | Status: DC
  Administered 2019-08-16 – 2019-08-19 (×4): 40 mg via SUBCUTANEOUS
  Filled 2019-08-16 (×9): qty 0.4

## 2019-08-16 NOTE — Progress Notes (Signed)
Physical Therapy Treatment Patient Details Name: William [name] MRN: [id number] DOB: July 23, 1981 Today's Date: 08/16/2019    History of Present Illness Patient is a 38 y/o male who presents with lower back pain, left shoulder pain, right foot and heel pain. Found to have right heel abscess s/p I&D 9/4, left upper chest wall abscess and mass s/p I&D and wound vac application 9/3, new onset DM, sepsis with MSSA bacteremia due to IV drug use. CT scan- left anterior chest wall soft tissue and intramuscular infection with extension into the thoracic cavity, air in the soft tissues of the right axilla adjacent to the right shoulder, multiple small pulmonary nodules. PMH- heroin IV drug use.    PT Comments    Pt is up today with better endurance and control of standing to walk a longer trip on the hall.  He is being assisted by a family member today, to get all items as needed and to sit by to manage his care needs.  Follow up acutely with gait and transfers, expecting to have him transfer home when ready to continue therapy as MD orders.  Pt will need follow up as he is  Still quite weak, and is not safe to walk alone on the hall even if lines were discontinued.    Follow Up Recommendations  Home health PT;Supervision for mobility/OOB     Equipment Recommendations  Rolling walker with 5" wheels    Recommendations for Other Services       Precautions / Restrictions Precautions Precautions: Fall Precaution Comments: mult lines and weak Restrictions Weight Bearing Restrictions: No    Mobility  Bed Mobility Overal bed mobility: Needs Assistance Bed Mobility: Supine to Sit Rolling: Min guard   Supine to sit: Min guard     General bed mobility comments: pt is able to get up to side of bed with PT just managing his lines  Transfers Overall transfer level: Needs assistance Equipment used: Rolling walker (2 wheeled) Transfers: Sit to/from Stand Sit to Stand: Min guard;Min assist          General transfer comment: min guard for higher surfaces, min assist for lower surfaces  Ambulation/Gait Ambulation/Gait assistance: Min guard Gait Distance (Feet): 250 Feet Assistive device: Rolling walker (2 wheeled) Gait Pattern/deviations: Step-through pattern;Decreased stride length;Wide base of support Gait velocity: decreased Gait velocity interpretation: <1.31 ft/sec, indicative of household ambulator General Gait Details: careful with mobility and using RW to support the activity   Stairs             Wheelchair Mobility    Modified Rankin (Stroke Patients Only)       Balance Overall balance assessment: Needs assistance Sitting-balance support: Feet supported Sitting balance-Leahy Scale: Good     Standing balance support: Bilateral upper extremity supported;During functional activity Standing balance-Leahy Scale: Fair                              Cognition Arousal/Alertness: Awake/alert Behavior During Therapy: WFL for tasks assessed/performed Overall Cognitive Status: Within Functional Limits for tasks assessed                     Current Attention Level: Sustained   Following Commands: Follows one step commands consistently              Exercises      General Comments General comments (skin integrity, edema, etc.): Pt is up with wound vac and IV, walked  on the hall and then was up in the chair.  Very good tolerance today, comfortable in chair with table to use his music equipment      Pertinent Vitals/Pain Pain Assessment: Faces Faces Pain Scale: Hurts little more Pain Location: resolving side soreness from tube removal Pain Descriptors / Indicators: Operative site guarding Pain Intervention(s): Monitored during session;Premedicated before session;Repositioned    Home Living                      Prior Function            PT Goals (current goals can now be found in the care plan section) Acute Rehab PT  Goals Patient Stated Goal: to get home Progress towards PT goals: Progressing toward goals    Frequency    Min 3X/week      PT Plan Current plan remains appropriate    Co-evaluation              AM-PAC PT "6 Clicks" Mobility   Outcome Measure  Help needed turning from your back to your side while in a flat bed without using bedrails?: None Help needed moving from lying on your back to sitting on the side of a flat bed without using bedrails?: None Help needed moving to and from a bed to a chair (including a wheelchair)?: A Little Help needed standing up from a chair using your arms (e.g., wheelchair or bedside chair)?: A Little Help needed to walk in hospital room?: A Little Help needed climbing 3-5 steps with a railing? : A Lot 6 Click Score: 19    End of Session Equipment Utilized During Treatment: Gait belt Activity Tolerance: Patient limited by fatigue;Patient limited by pain;Treatment limited secondary to medical complications (Comment) Patient left: in chair;with call bell/phone within reach;with family/visitor present Nurse Communication: Mobility status PT Visit Diagnosis: Unsteadiness on feet (R26.81);Muscle weakness (generalized) (M62.81);Difficulty in walking, not elsewhere classified (R26.2);Pain Pain - Right/Left: Left Pain - part of body: Shoulder     Time: 1349-1420 PT Time Calculation (min) (ACUTE ONLY): 31 min  Charges:  $Gait Training: 8-22 mins $Therapeutic Activity: 8-22 mins                 Ramond Dial 08/16/2019, 3:27 PM   Mee Hives, PT MS Acute Rehab Dept. Number: [place] [facility] [place]

## 2019-08-16 NOTE — Progress Notes (Signed)
PROGRESS NOTE  William [name]  DOB: Feb 04, 1981  PCP: Patient, No Pcp Per ONG:[id number]  DOA: 07/31/2019  LOS: 15 days   Brief narrative: 38 year old no previous medical history, heroin injection for last 10 years presented to emergency room on 9/1 with multiple complaints including lower back pain, left shoulder pain, right foot and heel pain. In the emergency room, he has right heel abscess, left upper chest wall abscess and mass, CT scan noted left anterior chest wall soft tissue and intramuscular infection with extension into the thoracic cavity, air in the soft tissues of the right axilla adjacent to the right shoulder, multiple small pulmonary nodules. Patient was admitted to ICU. Underwent surgical debridement of the chest wall abscess and wound VAC placement. Also found to have new diagnosis of diabetes. MRI showed multiple epidural and paraspinal abscesses. Blood cultures 9/2: MSSA Blood cultures 9/3: no growth so far. Wound culture 9/2: MSSA. Right heel culture 9/3: Abundant WBC. MSSA. Repeat c/s/ : 9/3,9/6 negative so far.   Subjective: Patient was seen and examined.  No overnight events.  Still has some left lower chest wall pain.  Assessment/Plan: Sepsis present on admission with MSSA bacteremia due to IV drug use: Septic pulmonary emboli, Left chest wall abscess, Right heel abscess, Extensive right posterolateral epidural abscess L2-L3, Osteomyelitis of the L5-S1, Posterior epidural abscess L5-S1 S2. - 9/11, patient underwent incision and drainage and debridement left chest wall on wound VAC, followed by CT CS. -Bedside I&D right heel, MSSA. -Multiple epidural abscess and discitis, seen by neurosurgery, recommended no surgical drainage but antibiotics. - followed by infectious disease. He was on cefazolin and metronidazole. Now remains on IV nafcillin. Repeat cultures were negative. - Intraoperative TEE with no evidence of endocarditis. - Anticipate intravenous  antibiotics for about 6 to 8 weeks.   Right shoulder septic bursitis 9/10, patient complained of stiffness of right shoulder.  CT scan showed septic bursitis without evidence of septic arthritis or osteomyelitis.  Orthopedic consult appreciated.  Recommended to continue IV antibiotics.  Also underwent arthrocentesis on 9/14.  Fluid analysis showed high WBC count 87,000 without bacteria.  Continue IV antibiotics.  IV drug GMW:NUUVOZDuse:Ongoing opiate use. Patient is motivated to quit. Continue provide symptomatic treatment. Opiates for pain relief for postoperative pain. Continue Tylenol and muscle relaxants. Patient to use Xanax for craving and anxiety. Patient is using some oxycodone with postop pain, will gradually taper it off and patient agreeable.  New onset diabetes:A1c 7.8. Started on insulin. Stabilizing on increased dose of insulin.  Currently on Levemir to 20 units and prandial insulin.  Hypokalemia-persistently low.  Replaced.  Magnesium also normal.  Will keep on replacement.  Insomnia -improving with Remeron at bedtime.  Mobility: Encourage ambulation Diet: Diabetic diet DVT prophylaxis: Heparin subcu Code Status: Full Code  Family Communication:None. Expected Discharge: Continue inpatient management  Consultants:  PCCM, transfer out  CT CS, following  Neurosurgery, signed off  Orthopedics, signed off  Procedures:  Chest wall abscess incision and drainage, 08/02/2019  Right heel incision and drainage, 08/03/2019  Antimicrobials:   Vancomycin, 08/01/2019>>> 08/02/2019  Cefepime, 08/01/2019>>> 08/02/2019  Cefazolin, 08/02/2019>>>>> 08/06/2019  Flagyl, 08/02/2019>>> 08/06/2019  Diflucan, on 08/03/2019 one dose  Nystatin swish and swallow, 08/03/2019 >>>  Nafcillin 08/06/2019>>>>  Infusions:  . lactated ringers 10 mL/hr at 08/10/19 0845  . nafcillin IV 2 g (08/16/19 [id number])    Scheduled Meds: . Chlorhexidine Gluconate Cloth  6 each Topical Daily  . docusate sodium   100 mg Oral Daily  .  enoxaparin (LOVENOX) injection  40 mg Subcutaneous Q24H  . feeding supplement (GLUCERNA SHAKE)  237 mL Oral TID BM  . feeding supplement (PRO-STAT SUGAR FREE 64)  30 mL Oral Daily  . insulin aspart  0-15 Units Subcutaneous TID WC  . insulin aspart  0-5 Units Subcutaneous QHS  . insulin aspart  5 Units Subcutaneous TID WC  . insulin glargine  20 Units Subcutaneous Daily  . magnesium oxide  400 mg Oral BID  . Melatonin  6 mg Oral Once  . metoprolol tartrate  12.5 mg Oral BID  . nystatin  5 mL Oral QID  . ondansetron  4 mg Intravenous Once  . potassium chloride  20 mEq Oral BID  . senna-docusate  1 tablet Oral BID  . sodium chloride flush  10-40 mL Intracatheter Q12H    PRN meds: acetaminophen, ALPRAZolam, calcium carbonate, ondansetron (ZOFRAN) IV, oxyCODONE, promethazine, sodium chloride flush, traZODone   Objective: Vitals:   08/16/19 0855 08/16/19 1214  BP: (!) 146/94 (!) 142/94  Pulse: 90 93  Resp: 15 20  Temp: 98.9 F (37.2 C) 99 F (37.2 C)  SpO2: 100% 100%    Intake/Output Summary (Last 24 hours) at 08/16/2019 1224 Last data filed at 08/16/2019 0624 Gross per 24 hour  Intake 900 ml  Output 850 ml  Net 50 ml   Filed Weights   08/10/19 0433 08/11/19 0401 08/15/19 0500  Weight: 78.6 kg 80.6 kg 77.1 kg   Weight change:  Body mass index is 21.82 kg/m.   Physical Exam: General exam: Appears calm and comfortable.  He is playing music. Skin: No rashes, lesions or ulcers. HEENT: Atraumatic, normocephalic, supple neck, no obvious bleeding Lungs: Clear to auscultate bilaterally.  Left chest wall wound VAC with thin fluid in the canister. CVS: Regular rate and rhythm, no murmur GI/Abd soft, nontender, nondistended, bowel sound present CNS: Alert, awake, oriented x3 Psychiatry: Mood appropriate. Extremities: No pedal edema, no calf tenderness  Data Review: I have personally reviewed the laboratory data and studies available.  Recent Labs  Lab  08/10/19 0447 08/11/19 0953 08/15/19 1442  WBC 8.6 7.4 6.7  NEUTROABS 7.0 6.3 5.6  HGB 7.9* 7.6* 8.0*  HCT 23.8* 22.9* 24.0*  MCV 92.6 92.3 94.1  PLT 402* 444* 446*   Recent Labs  Lab 08/10/19 0447 08/11/19 0953 08/12/19 0540 08/14/19 0500 08/15/19 1442  NA 136 137 137 140 137  K 3.0* 2.5* 3.3* 2.7* 2.9*  CL 99 100 101 103 98  CO2 27 29 28 29 28   GLUCOSE 133* 131* 156* 120* 186*  BUN 14 11 11 8 8   CREATININE 0.86 0.81 0.77 0.71 0.64  CALCIUM 7.7* 7.5* 7.9* 7.7* 7.9*  MG 1.9  --  1.9  --  1.8  PHOS 3.7  --   --   --  3.7    Total time spent: 30 minutes for direct patient care.  Barb Merino, MD  [facility] 08/16/2019

## 2019-08-16 NOTE — Progress Notes (Addendum)
6 Days Post-Op Procedure(s) (LRB): WOUND DEBRIDEMENT (N/A) WOUND VAC CHANGE (N/A) Subjective: Awake and alert, again complains of left lower chest discomfort with cough or sneeze.   Objective: Vital signs in last 24 hours: Temp:  [98.5 F (36.9 C)-99.9 F (37.7 C)] 98.9 F (37.2 C) (09/17 0855) Pulse Rate:  [86-106] 90 (09/17 0855) Resp:  [15-20] 15 (09/17 0855) BP: (136-148)/(79-103) 146/94 (09/17 0855) SpO2:  [99 %-100 %] 100 % (09/17 0855)    Intake/Output from previous day: 09/16 0701 - 09/17 0700 In: 900 [P.O.:600; IV Piggyback:300] Out: 850 [Urine:850] Intake/Output this shift: No intake/output data recorded.  Physical Exam: General appearance:alert, cooperative andnodistress Wound:The vacis intact and functioning appropriately. Drainage in cannister is thin, serosanguinous fluid with no volume recorded over past 24 hours.   Lab Results: Recent Labs    08/15/19 1442  WBC 6.7  HGB 8.0*  HCT 24.0*  PLT 446*   BMET:  Recent Labs    08/14/19 0500 08/15/19 1442  NA 140 137  K 2.7* 2.9*  CL 103 98  CO2 29 28  GLUCOSE 120* 186*  BUN 8 8  CREATININE 0.71 0.64  CALCIUM 7.7* 7.9*    PT/INR: No results for input(s): LABPROT, INR in the last 72 hours. ABG    Component Value Date/Time   PHART 7.482 (H) 08/01/2019 0909   HCO3 31.0 (H) 08/01/2019 0909   TCO2 32 08/01/2019 0909   O2SAT 97.0 08/01/2019 0909   CBG (last 3)  Recent Labs    08/15/19 1730 08/15/19 2125 08/16/19 0622  GLUCAP 133* 191* 137*    Assessment/Plan: S/P Procedure(s) (LRB): WOUND DEBRIDEMENT (N/A) WOUND VAC CHANGE (N/A)  -POD-14incision and drainage of the left [place] joint abscess and subsequent wound vac placement and POD6excisional debridement and vac change in the OR. Wound examined during vac change 08/14/19 and appeared clean and was granulating.ABX per ID. We will continue to follow. Will check CXR in AM to assure no re-accumulation of effusion since JP removed.    -Right shoulder pain and limited ROM-Improved. Evaluated by ortho. Right shoulder joint aspirated 08/13/19. Gram stain shows moderate WBC's but no organisms. Cx with no growth at 2 days.   -Anemia, hypokalemia--mgt per primary team.  -Left lower chest pain- no chest wall tenderness or lesions on exam. Suspect this is pleuritic.    LOS: 15 days    Antony Odea, PA-C [phone] 08/16/2019  I have seen and examined Lenord Carbo and agree with the above assessment  and plan.  Grace Isaac MD [facility] [phone] Office [phone] 08/17/2019 1:14 PM

## 2019-08-16 NOTE — Plan of Care (Signed)
Patient stable, discussed POC with patient, agreeable with plan, denies question/concerns at this time.  

## 2019-08-17 LAB — GLUCOSE, CAPILLARY
Glucose-Capillary: 289 mg/dL — ABNORMAL HIGH (ref 70–99)
Glucose-Capillary: 96 mg/dL (ref 70–99)
Glucose-Capillary: 204 mg/dL — ABNORMAL HIGH (ref 70–99)
Glucose-Capillary: 147 mg/dL — ABNORMAL HIGH (ref 70–99)

## 2019-08-17 NOTE — Progress Notes (Signed)
PROGRESS NOTE  William [name]  DOB: [birth year]  PCP: Patient, No Pcp Per ZOX:[id number]  DOA: 07/31/2019  LOS: 16 days   Brief narrative: 38 year old no previous medical history, heroin injection for last 10 years presented to emergency room on 9/1 with multiple complaints including lower back pain, left shoulder pain, right foot and heel pain. In the emergency room, he has right heel abscess, left upper chest wall abscess and mass, CT scan noted left anterior chest wall soft tissue and intramuscular infection with extension into the thoracic cavity, air in the soft tissues of the right axilla adjacent to the right shoulder, multiple small pulmonary nodules. Patient was admitted to ICU. Underwent surgical debridement of the chest wall abscess and wound VAC placement. Also found to have new diagnosis of diabetes. MRI showed multiple epidural and paraspinal abscesses. Blood cultures 9/2: MSSA Blood cultures 9/3: no growth so far. Wound culture 9/2: MSSA. Right heel culture 9/3: Abundant WBC. MSSA. Repeat c/s/ : 9/3,9/6 negative so far.   Subjective: Patient was seen and examined.  No overnight events. Chest pain better.   Assessment/Plan: Sepsis present on admission with MSSA bacteremia due to IV drug use: Septic pulmonary emboli, Left chest wall abscess, Right heel abscess, Extensive right posterolateral epidural abscess L2-L3, Osteomyelitis of the L5-S1, Posterior epidural abscess L5-S1 S2. - 9/11, patient underwent incision and drainage and debridement left chest wall on wound VAC, followed by CT CS. -Bedside I&D right heel, MSSA. -Multiple epidural abscess and discitis, seen by neurosurgery, recommended no surgical drainage but antibiotics. - followed by infectious disease. He was on cefazolin and metronidazole. Now remains on IV nafcillin. Repeat cultures were negative. - Intraoperative TEE with no evidence of endocarditis. - Anticipate intravenous antibiotics for about 6 to 8  weeks.   Right shoulder septic bursitis 9/10, patient complained of stiffness of right shoulder.  CT scan showed septic bursitis without evidence of septic arthritis or osteomyelitis.  Orthopedic consult appreciated.  Recommended to continue IV antibiotics.  Also underwent arthrocentesis on 9/14.  Fluid analysis showed high WBC count 87,000 without bacteria.  Continue IV antibiotics.  IV drug WJX:BJYNWGNuse:Ongoing opiate use. Patient is motivated to quit. Continue provide symptomatic treatment. Opiates for pain relief for postoperative pain. Continue Tylenol and muscle relaxants. Patient to use Xanax for craving and anxiety. Patient is using some oxycodone with postop pain, will gradually taper it off and patient agreeable.  New onset diabetes:A1c 7.8. Started on insulin. Stabilizing on increased dose of insulin.  Currently on Levemir to 20 units and prandial insulin.  Hypokalemia-persistently low.  Replaced.  Magnesium also normal.  Will keep on replacement.  Recheck morning labs to ensure stabilization.  Will check magnesium and phosphorus with the morning labs.  Insomnia -improving with Remeron at bedtime.  Mobility: Encourage ambulation Diet: Diabetic diet DVT prophylaxis: Heparin subcu Code Status: Full Code  Family Communication:None. Expected Discharge: Continue inpatient management  Consultants:  PCCM, transfer out  CT CS, following  Neurosurgery, signed off  Orthopedics, signed off  Procedures:  Chest wall abscess incision and drainage, 08/02/2019  Right heel incision and drainage, 08/03/2019  Antimicrobials:   Vancomycin, 08/01/2019>>> 08/02/2019  Cefepime, 08/01/2019>>> 08/02/2019  Cefazolin, 08/02/2019>>>>> 08/06/2019  Flagyl, 08/02/2019>>> 08/06/2019  Diflucan, on 08/03/2019 one dose  Nystatin swish and swallow, 08/03/2019 >>>  Nafcillin 08/06/2019>>>>  Infusions:  . lactated ringers 10 mL/hr at 08/10/19 0845  . nafcillin IV 2 g (08/17/19 0949)    Scheduled  Meds: . Chlorhexidine Gluconate Cloth  6 each Topical  Daily  . docusate sodium  100 mg Oral Daily  . enoxaparin (LOVENOX) injection  40 mg Subcutaneous Q24H  . feeding supplement (GLUCERNA SHAKE)  237 mL Oral TID BM  . feeding supplement (PRO-STAT SUGAR FREE 64)  30 mL Oral Daily  . insulin aspart  0-15 Units Subcutaneous TID WC  . insulin aspart  0-5 Units Subcutaneous QHS  . insulin aspart  5 Units Subcutaneous TID WC  . insulin glargine  20 Units Subcutaneous Daily  . magnesium oxide  400 mg Oral BID  . Melatonin  6 mg Oral Once  . metoprolol tartrate  12.5 mg Oral BID  . nystatin  5 mL Oral QID  . ondansetron  4 mg Intravenous Once  . potassium chloride  20 mEq Oral BID  . senna-docusate  1 tablet Oral BID  . sodium chloride flush  10-40 mL Intracatheter Q12H    PRN meds: acetaminophen, ALPRAZolam, calcium carbonate, ondansetron (ZOFRAN) IV, oxyCODONE, promethazine, sodium chloride flush, traZODone   Objective: Vitals:   08/17/19 0452 08/17/19 0900  BP: (!) 142/90 (!) 134/92  Pulse: (!) 102 96  Resp: 17 20  Temp: 98.8 F (37.1 C) 98.2 F (36.8 C)  SpO2: 99% 100%    Intake/Output Summary (Last 24 hours) at 08/17/2019 1127 Last data filed at 08/17/2019 0452 Gross per 24 hour  Intake -  Output 1200 ml  Net -1200 ml   Filed Weights   08/10/19 0433 08/11/19 0401 08/15/19 0500  Weight: 78.6 kg 80.6 kg 77.1 kg   Weight change:  Body mass index is 21.82 kg/m.   Physical Exam: General exam: Appears calm and comfortable.   Skin: No rashes, lesions or ulcers. HEENT: Atraumatic, normocephalic, supple neck, no obvious bleeding Lungs: Clear to auscultate bilaterally.  Left chest wall wound VAC with serosanguineous fluid in the canister. CVS: Regular rate and rhythm, no murmur GI/Abd soft, nontender, nondistended, bowel sound present CNS: Alert, awake, oriented x3 Psychiatry: Mood appropriate. Extremities: No pedal edema, no calf tenderness  Data Review: I have  personally reviewed the laboratory data and studies available.  Recent Labs  Lab 08/11/19 0953 08/15/19 1442  WBC 7.4 6.7  NEUTROABS 6.3 5.6  HGB 7.6* 8.0*  HCT 22.9* 24.0*  MCV 92.3 94.1  PLT 444* 446*   Recent Labs  Lab 08/11/19 0953 08/12/19 0540 08/14/19 0500 08/15/19 1442  NA 137 137 140 137  K 2.5* 3.3* 2.7* 2.9*  CL 100 101 103 98  CO2 29 28 29 28   GLUCOSE 131* 156* 120* 186*  BUN 11 11 8 8   CREATININE 0.81 0.77 0.71 0.64  CALCIUM 7.5* 7.9* 7.7* 7.9*  MG  --  1.9  --  1.8  PHOS  --   --   --  3.7    Total time spent: 30 minutes for direct patient care.  Barb Merino, MD  [facility] 08/17/2019

## 2019-08-17 NOTE — Consult Note (Signed)
  WOC Nurse wound follow upfor left [place] joint NPWT dressing change Wound type:full thickness NPWT dressing change.  Patient is premedicated for pain  PA at bedside and evaluates wound Measurement:1.5X6X.5cm,  Wound bed:red and moist Drainagesmall amt pink drainage, no odor Periwound: intact Dressing procedure/placement/frequency: . One piece of black foam removed from wound bed, wound cleansed with NS. One piece of black foam used to fill defect, covered with drape. Attached to 125mmHg continuous negative pressure and an immediate seal is achieved. WOC team will follow for M/W/F dressing change.  Yared Barefoot MSN, RN, FNP-BC CWON Wound, Ostomy, Continence Nurse Pager [phone]   

## 2019-08-17 NOTE — Progress Notes (Signed)
7 Days Post-Op Procedure(s) (LRB): WOUND DEBRIDEMENT (N/A) WOUND VAC CHANGE (N/A) Subjective: No new concerns today.  He walked with PT yesterday using a walker. He is still weak and unsteady but is making progress.   Objective: Vital signs in last 24 hours: Temp:  [97.9 F (36.6 C)-99.1 F (37.3 C)] 97.9 F (36.6 C) (09/18 1159) Pulse Rate:  [90-102] 90 (09/18 1159) Resp:  [17-20] 20 (09/18 1159) BP: (134-142)/(83-92) 137/83 (09/18 1159) SpO2:  [99 %-100 %] 100 % (09/18 1159)     Intake/Output from previous day: 09/17 0701 - 09/18 0700 In: -  Out: 1200 [Urine:1200] Intake/Output this shift: No intake/output data recorded.  Physical Exam: General appearance:alert, cooperative andnodistress Wound:The wound was examined during vac change this afternoon. It is dry and granulating but still has some exposed muscle along the superior rim.  Drainage in cannister is thin, serosanguinousfluid.   Lab Results: Recent Labs    08/15/19 1442  WBC 6.7  HGB 8.0*  HCT 24.0*  PLT 446*   BMET:  Recent Labs    08/15/19 1442  NA 137  K 2.9*  CL 98  CO2 28  GLUCOSE 186*  BUN 8  CREATININE 0.64  CALCIUM 7.9*    PT/INR: No results for input(s): LABPROT, INR in the last 72 hours. ABG    Component Value Date/Time   PHART 7.482 (H) 08/01/2019 0909   HCO3 31.0 (H) 08/01/2019 0909   TCO2 32 08/01/2019 0909   O2SAT 97.0 08/01/2019 0909   CBG (last 3)  Recent Labs    08/16/19 2103 08/17/19 0616 08/17/19 1157  GLUCAP 134* 289* 96    Assessment/Plan: S/P Procedure(s) (LRB): WOUND DEBRIDEMENT (N/A) WOUND VAC CHANGE (N/A)  -POD-15incision and drainage of the left [place] joint abscess and subsequent wound vac placement and POD7excisional debridement and vac change in the OR. Wound examined during vac changetodayand appeared clean and was granulating.It will benefit from continued vac therapy for at least another week. ABX per ID.We will continue to follow.CXR  showed no significant re- accumulation of left pleural fluid.   -Right shoulder pain and limited ROM-Improved. Evaluated by ortho. Right shoulder joint aspirated9/14/20. Gram stain shows moderate WBC's but no organisms.Cx with no growth at 2 days.Encouraged him to continue working on exercises / ROM per PT instructions.  -Anemia, hypokalemia--mgt per primary team.  -Left lower chest pain- no chest wall tenderness or lesions on exam. This was likely pleuritis and is improving.    LOS: 16 days    Antony Odea, [place] [phone] 08/17/2019

## 2019-08-18 LAB — GLUCOSE, CAPILLARY
Glucose-Capillary: 107 mg/dL — ABNORMAL HIGH (ref 70–99)
Glucose-Capillary: 180 mg/dL — ABNORMAL HIGH (ref 70–99)
Glucose-Capillary: 172 mg/dL — ABNORMAL HIGH (ref 70–99)
Glucose-Capillary: 130 mg/dL — ABNORMAL HIGH (ref 70–99)
Glucose-Capillary: 198 mg/dL — ABNORMAL HIGH (ref 70–99)

## 2019-08-18 LAB — COMPREHENSIVE METABOLIC PANEL
ALT: 15 U/L (ref 0–44)
AST: 13 U/L — ABNORMAL LOW (ref 15–41)
Albumin: 1.5 g/dL — ABNORMAL LOW (ref 3.5–5.0)
Alkaline Phosphatase: 91 U/L (ref 38–126)
Anion gap: 9 (ref 5–15)
BUN: 9 mg/dL (ref 6–20)
CO2: 30 mmol/L (ref 22–32)
Calcium: 8 mg/dL — ABNORMAL LOW (ref 8.9–10.3)
Chloride: 100 mmol/L (ref 98–111)
Creatinine, Ser: 0.68 mg/dL (ref 0.61–1.24)
GFR calc Af Amer: >60 mL/min (ref >60)
GFR calc non Af Amer: >60 mL/min (ref >60)
Glucose, Bld: 101 mg/dL — ABNORMAL HIGH (ref 70–99)
Potassium: 3.2 mmol/L — ABNORMAL LOW (ref 3.5–5.1)
Sodium: 139 mmol/L (ref 135–145)
Total Bilirubin: 1 mg/dL (ref 0.3–1.2)
Total Protein: 5.8 g/dL — ABNORMAL LOW (ref 6.5–8.1)

## 2019-08-18 LAB — CBC WITH DIFFERENTIAL/PLATELET
Abs Immature Granulocytes: 0.04 10*3/uL (ref 0.00–0.07)
Basophils Absolute: 0 10*3/uL (ref 0.0–0.1)
Basophils Relative: 0 %
Eosinophils Absolute: 0.1 10*3/uL (ref 0.0–0.5)
Eosinophils Relative: 2 %
HCT: 27.7 % — ABNORMAL LOW (ref 39.0–52.0)
Hemoglobin: 9.1 g/dL — ABNORMAL LOW (ref 13.0–17.0)
Immature Granulocytes: 1 %
Lymphocytes Relative: 15 %
Lymphs Abs: 0.8 10*3/uL (ref 0.7–4.0)
MCH: 31.3 pg (ref 26.0–34.0)
MCHC: 32.9 g/dL (ref 30.0–36.0)
MCV: 95.2 fL (ref 80.0–100.0)
Monocytes Absolute: 0.3 10*3/uL (ref 0.1–1.0)
Monocytes Relative: 6 %
Neutro Abs: 4 10*3/uL (ref 1.7–7.7)
Neutrophils Relative %: 76 %
Platelets: 396 10*3/uL (ref 150–400)
RBC: 2.91 MIL/uL — ABNORMAL LOW (ref 4.22–5.81)
RDW: 17.1 % — ABNORMAL HIGH (ref 11.5–15.5)
WBC: 5.3 10*3/uL (ref 4.0–10.5)
nRBC: 0 % (ref 0.0–0.2)

## 2019-08-18 LAB — NA AND K (SODIUM & POTASSIUM), RAND UR
Potassium Urine: 35 mmol/L
Sodium, Ur: 117 mmol/L

## 2019-08-18 LAB — PHOSPHORUS: Phosphorus: 3.9 mg/dL (ref 2.5–4.6)

## 2019-08-18 LAB — MAGNESIUM: Magnesium: 2 mg/dL (ref 1.7–2.4)

## 2019-08-18 MED ORDER — POTASSIUM CHLORIDE CRYS ER 20 MEQ PO TBCR
20.0000 meq | EXTENDED_RELEASE_TABLET | Freq: Three times a day (TID) | ORAL | Status: DC
  Administered 2019-08-18 – 2019-08-19 (×4): 20 meq via ORAL
  Filled 2019-08-18 (×4): qty 1

## 2019-08-18 NOTE — Progress Notes (Signed)
PROGRESS NOTE  William [name]  DOB: Jan 18, 1981  PCP: Patient, No Pcp Per WUJ:[id number]:[id number]  DOA: 07/31/2019  LOS: 17 days   Brief narrative: 38 year old no previous medical history, heroin injection for last 10 years presented to emergency room on 9/1 with multiple complaints including lower back pain, left shoulder pain, right foot and heel pain. In the emergency room, he has right heel abscess, left upper chest wall abscess and mass, CT scan noted left anterior chest wall soft tissue and intramuscular infection with extension into the thoracic cavity, air in the soft tissues of the right axilla adjacent to the right shoulder, multiple small pulmonary nodules. Patient was admitted to ICU. Underwent surgical debridement of the chest wall abscess and wound VAC placement. Also found to have new diagnosis of diabetes. MRI showed multiple epidural and paraspinal abscesses. Blood cultures 9/2: MSSA Blood cultures 9/3: no growth so far. Wound culture 9/2: MSSA. Right heel culture 9/3: Abundant WBC. MSSA. Repeat c/s/ : 9/3,9/6 negative so far.   Subjective: Patient was seen and examined.  No overnight events. Chest pain better and only hurts with sneezing.   Remains afebrile. Lovenox was painful.  Assessment/Plan: Sepsis present on admission with MSSA bacteremia due to IV drug use: Septic pulmonary emboli, Left chest wall abscess, Right heel abscess, Extensive right posterolateral epidural abscess L2-L3, Osteomyelitis of the L5-S1, Posterior epidural abscess L5-S1 S2. - 9/11, patient underwent incision and drainage and debridement left chest wall on wound VAC, followed by CT CS. -Bedside I&D right heel, MSSA. -Multiple epidural abscess and discitis, seen by neurosurgery, recommended no surgical drainage but antibiotics. - followed by infectious disease. He was on cefazolin and metronidazole. Now remains on IV nafcillin. Repeat cultures were negative. - Intraoperative TEE with no evidence of  endocarditis. - Anticipate intravenous antibiotics for about 6 to 8 weeks.   Right shoulder septic bursitis 9/10, patient complained of stiffness of right shoulder.  CT scan showed septic bursitis without evidence of septic arthritis or osteomyelitis.  Orthopedic consult appreciated.  Recommended to continue IV antibiotics.  Also underwent arthrocentesis on 9/14.  Fluid analysis showed high WBC count 87,000 without bacteria.  Continue IV antibiotics.  IV drug NFA:OZHYQMVuse:Ongoing opiate use. Patient is motivated to quit. Continue provide symptomatic treatment. Opiates for pain relief for postoperative pain. Continue Tylenol and muscle relaxants. Patient to use Xanax for craving and anxiety. Patient is using some oxycodone with postop pain, will gradually taper it off after wound VAC removal.  New onset diabetes:A1c 7.8. Started on insulin. Stabilizing on increased dose of insulin.  Currently on Levemir to 20 units and prandial insulin.  Hypokalemia-persistently low.  Keep on a schedule replacement.  Also with family history of hypokalemia.  Will check urine potassium.  Insomnia -improving with Remeron at bedtime.  Mobility: Encourage ambulation Diet: Diabetic diet DVT prophylaxis: Heparin subcu Code Status: Full Code  Family Communication:None. Expected Discharge: Continue inpatient management  Consultants:  PCCM, transfer out  CT CS, following  Neurosurgery, signed off  Orthopedics, signed off  Procedures:  Chest wall abscess incision and drainage, 08/02/2019  Right heel incision and drainage, 08/03/2019  Antimicrobials:   Vancomycin, 08/01/2019>>> 08/02/2019  Cefepime, 08/01/2019>>> 08/02/2019  Cefazolin, 08/02/2019>>>>> 08/06/2019  Flagyl, 08/02/2019>>> 08/06/2019  Diflucan, on 08/03/2019 one dose  Nystatin swish and swallow, 08/03/2019 >>>  Nafcillin 08/06/2019>>>>  Infusions:  . lactated ringers 10 mL/hr at 08/10/19 0845  . nafcillin IV 2 g (08/18/19 1120)     Scheduled Meds: . Chlorhexidine Gluconate Cloth  6  each Topical Daily  . docusate sodium  100 mg Oral Daily  . enoxaparin (LOVENOX) injection  40 mg Subcutaneous Q24H  . feeding supplement (GLUCERNA SHAKE)  237 mL Oral TID BM  . feeding supplement (PRO-STAT SUGAR FREE 64)  30 mL Oral Daily  . insulin aspart  0-15 Units Subcutaneous TID WC  . insulin aspart  0-5 Units Subcutaneous QHS  . insulin aspart  5 Units Subcutaneous TID WC  . insulin glargine  20 Units Subcutaneous Daily  . magnesium oxide  400 mg Oral BID  . Melatonin  6 mg Oral Once  . metoprolol tartrate  12.5 mg Oral BID  . nystatin  5 mL Oral QID  . ondansetron  4 mg Intravenous Once  . potassium chloride  20 mEq Oral TID  . senna-docusate  1 tablet Oral BID  . sodium chloride flush  10-40 mL Intracatheter Q12H    PRN meds: acetaminophen, ALPRAZolam, calcium carbonate, ondansetron (ZOFRAN) IV, oxyCODONE, promethazine, sodium chloride flush, traZODone   Objective: Vitals:   08/17/19 2308 08/18/19 0916  BP: (!) 146/85 140/90  Pulse: 97 91  Resp: 17 16  Temp:  99.4 F (37.4 C)  SpO2: 100% 100%    Intake/Output Summary (Last 24 hours) at 08/18/2019 1421 Last data filed at 08/17/2019 2301 Gross per 24 hour  Intake -  Output 350 ml  Net -350 ml   Filed Weights   08/10/19 0433 08/11/19 0401 08/15/19 0500  Weight: 78.6 kg 80.6 kg 77.1 kg   Weight change:  Body mass index is 21.82 kg/m.   Physical Exam: General exam: Appears calm and comfortable.   Skin: No rashes, lesions or ulcers. HEENT: Atraumatic, normocephalic, supple neck, no obvious bleeding Lungs: Clear to auscultate bilaterally.  Left chest wall wound VAC with minimal serosanguineous fluid in the canister. CVS: Regular rate and rhythm, no murmur GI/Abd soft, nontender, nondistended, bowel sound present CNS: Alert, awake, oriented x3 Psychiatry: Mood appropriate. Extremities: No pedal edema, no calf tenderness  Data Review: I have personally  reviewed the laboratory data and studies available.  Recent Labs  Lab 08/15/19 1442 08/18/19 0655  WBC 6.7 5.3  NEUTROABS 5.6 4.0  HGB 8.0* 9.1*  HCT 24.0* 27.7*  MCV 94.1 95.2  PLT 446* 396   Recent Labs  Lab 08/12/19 0540 08/14/19 0500 08/15/19 1442 08/18/19 0655  NA 137 140 137 139  K 3.3* 2.7* 2.9* 3.2*  CL 101 103 98 100  CO2 28 29 28 30   GLUCOSE 156* 120* 186* 101*  BUN 11 8 8 9   CREATININE 0.77 0.71 0.64 0.68  CALCIUM 7.9* 7.7* 7.9* 8.0*  MG 1.9  --  1.8 2.0  PHOS  --   --  3.7 3.9    Total time spent: 25 minutes for direct patient care.  Barb Merino, MD  [facility] 08/18/2019

## 2019-08-19 LAB — GLUCOSE, CAPILLARY
Glucose-Capillary: 115 mg/dL — ABNORMAL HIGH (ref 70–99)
Glucose-Capillary: 186 mg/dL — ABNORMAL HIGH (ref 70–99)
Glucose-Capillary: 134 mg/dL — ABNORMAL HIGH (ref 70–99)
Glucose-Capillary: 242 mg/dL — ABNORMAL HIGH (ref 70–99)

## 2019-08-19 MED ORDER — POTASSIUM CHLORIDE CRYS ER 10 MEQ PO TBCR
10.0000 meq | EXTENDED_RELEASE_TABLET | Freq: Three times a day (TID) | ORAL | Status: DC
  Administered 2019-08-19 – 2019-08-28 (×27): 10 meq via ORAL
  Filled 2019-08-19 (×27): qty 1

## 2019-08-19 NOTE — Progress Notes (Signed)
PROGRESS NOTE  William [name]  DOB: 08/11/1981  PCP: Patient, No Pcp Per UEA:[id number]  DOA: 07/31/2019  LOS: 18 days   Brief narrative: 38 year old no previous medical history, heroin injection for last 10 years presented to emergency room on 9/1 with multiple complaints including lower back pain, left shoulder pain, right foot and heel pain. In the emergency room, he has right heel abscess, left upper chest wall abscess and mass, CT scan noted left anterior chest wall soft tissue and intramuscular infection with extension into the thoracic cavity, air in the soft tissues of the right axilla adjacent to the right shoulder, multiple small pulmonary nodules. Patient was admitted to ICU. Underwent surgical debridement of the chest wall abscess and wound VAC placement. Also found to have new diagnosis of diabetes. MRI showed multiple epidural and paraspinal abscesses. Blood cultures 9/2: MSSA Blood cultures 9/3: no growth so far. Wound culture 9/2: MSSA. Right heel culture 9/3: Abundant WBC. MSSA. Repeat c/s/ : 9/3,9/6 negative so far.   Subjective: Patient was seen and examined.  No overnight events. Chest pain better and only hurts with sneezing.   Remains afebrile.  Assessment/Plan: Sepsis present on admission with MSSA bacteremia due to IV drug use: Septic pulmonary emboli, Left chest wall abscess, Right heel abscess, Extensive right posterolateral epidural abscess L2-L3, Osteomyelitis of the L5-S1, Posterior epidural abscess L5-S1 S2. - 9/11, patient underwent incision and drainage and debridement left chest wall on wound VAC, followed by CT CS. -Bedside I&D right heel, MSSA. -Multiple epidural abscess and discitis, seen by neurosurgery, recommended no surgical drainage but antibiotics. - followed by infectious disease. He was on cefazolin and metronidazole. Now remains on IV nafcillin. Repeat cultures were negative. - Intraoperative TEE with no evidence of endocarditis. -  Anticipate intravenous antibiotics for about 6 to 8 weeks.   Right shoulder septic bursitis 9/10, patient complained of stiffness of right shoulder.  CT scan showed septic bursitis without evidence of septic arthritis or osteomyelitis.  Orthopedic consult appreciated.  Recommended to continue IV antibiotics.  Also underwent arthrocentesis on 9/14.  Fluid analysis showed high WBC count 87,000 without bacteria.  Continue IV antibiotics.  IV drug YNW:GNFAOZHuse:Ongoing opiate use. Patient is motivated to quit. Continue provide symptomatic treatment. Opiates for pain relief for postoperative pain. Continue Tylenol and muscle relaxants. Patient to use Xanax for craving and anxiety. Patient is using some oxycodone with postop pain, will gradually taper it off after wound VAC removal.  New onset diabetes:A1c 7.8. Started on insulin. Stabilizing on increased dose of insulin.  Currently on Levemir to 20 units and prandial insulin.  Hypokalemia-persistently low.  Keep on a schedule replacement.  Urine potassium level pending.  Insomnia -improving with Remeron at bedtime.  Mobility: Encourage ambulation Diet: Diabetic diet DVT prophylaxis: Heparin subcu Code Status: Full Code  Family Communication:None. Expected Discharge: Continue inpatient management  Consultants:  PCCM, transfer out  CT CS, following  Neurosurgery, signed off  Orthopedics, signed off  Procedures:  Chest wall abscess incision and drainage, 08/02/2019  Right heel incision and drainage, 08/03/2019  Antimicrobials:   Vancomycin, 08/01/2019>>> 08/02/2019  Cefepime, 08/01/2019>>> 08/02/2019  Cefazolin, 08/02/2019>>>>> 08/06/2019  Flagyl, 08/02/2019>>> 08/06/2019  Diflucan, on 08/03/2019 one dose  Nystatin swish and swallow, 08/03/2019 >>>  Nafcillin 08/06/2019>>>>  Infusions:  . lactated ringers 10 mL/hr at 08/10/19 0845  . nafcillin IV 2 g (08/19/19 1000)    Scheduled Meds: . Chlorhexidine Gluconate Cloth  6 each  Topical Daily  . docusate sodium  100 mg  Oral Daily  . enoxaparin (LOVENOX) injection  40 mg Subcutaneous Q24H  . feeding supplement (GLUCERNA SHAKE)  237 mL Oral TID BM  . feeding supplement (PRO-STAT SUGAR FREE 64)  30 mL Oral Daily  . insulin aspart  0-15 Units Subcutaneous TID WC  . insulin aspart  0-5 Units Subcutaneous QHS  . insulin aspart  5 Units Subcutaneous TID WC  . insulin glargine  20 Units Subcutaneous Daily  . magnesium oxide  400 mg Oral BID  . Melatonin  6 mg Oral Once  . metoprolol tartrate  12.5 mg Oral BID  . nystatin  5 mL Oral QID  . ondansetron  4 mg Intravenous Once  . potassium chloride  10 mEq Oral TID  . sodium chloride flush  10-40 mL Intracatheter Q12H    PRN meds: acetaminophen, ALPRAZolam, calcium carbonate, ondansetron (ZOFRAN) IV, oxyCODONE, promethazine, sodium chloride flush, traZODone   Objective: Vitals:   08/19/19 0624 08/19/19 0728  BP: (!) 144/93 (!) 140/96  Pulse: 96 92  Resp: 16 20  Temp: 97.8 F (36.6 C) 98.7 F (37.1 C)  SpO2: 100% 100%    Intake/Output Summary (Last 24 hours) at 08/19/2019 1208 Last data filed at 08/18/2019 2342 Gross per 24 hour  Intake 320 ml  Output 500 ml  Net -180 ml   Filed Weights   08/10/19 0433 08/11/19 0401 08/15/19 0500  Weight: 78.6 kg 80.6 kg 77.1 kg   Weight change:  Body mass index is 21.82 kg/m.   Physical Exam: General exam: Appears calm and comfortable.   Skin: No rashes, lesions or ulcers. HEENT: Atraumatic, normocephalic, supple neck, no obvious bleeding Lungs: Clear to auscultate bilaterally.  Left chest wall wound VAC with minimal serosanguineous fluid in the canister. CVS: Regular rate and rhythm, no murmur GI/Abd soft, nontender, nondistended, bowel sound present CNS: Alert, awake, oriented x3 Psychiatry: Mood appropriate. Extremities: No pedal edema, no calf tenderness  Data Review: I have personally reviewed the laboratory data and studies available.  Recent Labs  Lab  08/15/19 1442 08/18/19 0655  WBC 6.7 5.3  NEUTROABS 5.6 4.0  HGB 8.0* 9.1*  HCT 24.0* 27.7*  MCV 94.1 95.2  PLT 446* 396   Recent Labs  Lab 08/14/19 0500 08/15/19 1442 08/18/19 0655  NA 140 137 139  K 2.7* 2.9* 3.2*  CL 103 98 100  CO2 29 28 30   GLUCOSE 120* 186* 101*  BUN 8 8 9   CREATININE 0.71 0.64 0.68  CALCIUM 7.7* 7.9* 8.0*  MG  --  1.8 2.0  PHOS  --  3.7 3.9    Total time spent: 25 minutes for direct patient care.  Barb Merino, MD  [facility] 08/19/2019

## 2019-08-19 NOTE — Progress Notes (Signed)
      [address]       [place] [address]             [phone]      9 Days Post-Op Procedure(s) (LRB): WOUND DEBRIDEMENT (N/A) WOUND VAC CHANGE (N/A) Subjective: Feels okay, no complaints.   Objective: Vital signs in last 24 hours: Temp:  [97.8 F (36.6 C)-98.7 F (37.1 C)] 98.7 F (37.1 C) (09/20 0728) Pulse Rate:  [91-108] 92 (09/20 0728) Resp:  [16-20] 20 (09/20 0728) BP: (140-144)/(86-96) 140/96 (09/20 0728) SpO2:  [100 %] 100 % (09/20 0728)     Intake/Output from previous day: 09/19 0701 - 09/20 0700 In: 320 [I.V.:120; IV Piggyback:200] Out: 500 [Urine:500] Intake/Output this shift: No intake/output data recorded.  General appearance: alert, cooperative and no distress Wound: wound vac with good suction  Lab Results: Recent Labs    08/18/19 0655  WBC 5.3  HGB 9.1*  HCT 27.7*  PLT 396   BMET:  Recent Labs    08/18/19 0655  NA 139  K 3.2*  CL 100  CO2 30  GLUCOSE 101*  BUN 9  CREATININE 0.68  CALCIUM 8.0*    PT/INR: No results for input(s): LABPROT, INR in the last 72 hours. ABG    Component Value Date/Time   PHART 7.482 (H) 08/01/2019 0909   HCO3 31.0 (H) 08/01/2019 0909   TCO2 32 08/01/2019 0909   O2SAT 97.0 08/01/2019 0909   CBG (last 3)  Recent Labs    08/18/19 1749 08/18/19 2126 08/19/19 0622  GLUCAP 130* 198* 115*    Assessment/Plan: S/P Procedure(s) (LRB): WOUND DEBRIDEMENT (N/A) WOUND VAC CHANGE (N/A)  1. Wound vac with good suction. Continue vac for 1 more week and then can wet-to-dry 2. In good spirits this morning and extremely happy with the care he has been receiving.  3. Blood glucose well controlled 4. Continue wound vac changes M,W,F 5. Continue antibiotics and antifungals     LOS: 18 days    Elgie Collard 08/19/2019

## 2019-08-20 LAB — GLUCOSE, CAPILLARY
Glucose-Capillary: 177 mg/dL — ABNORMAL HIGH (ref 70–99)
Glucose-Capillary: 190 mg/dL — ABNORMAL HIGH (ref 70–99)
Glucose-Capillary: 140 mg/dL — ABNORMAL HIGH (ref 70–99)
Glucose-Capillary: 159 mg/dL — ABNORMAL HIGH (ref 70–99)

## 2019-08-20 NOTE — Progress Notes (Addendum)
10 Days Post-Op Procedure(s) (LRB): WOUND DEBRIDEMENT (N/A) WOUND VAC CHANGE (N/A) Subjective: Pt seen during wound vac change this morning.   Objective: Vital signs in last 24 hours: Temp:  [97.9 F (36.6 C)-100.2 F (37.9 C)] 97.9 F (36.6 C) (09/21 0741) Pulse Rate:  [94-115] 95 (09/21 0741) Resp:  [16-19] 16 (09/21 0741) BP: (123-144)/(86-98) 136/87 (09/21 0741) SpO2:  [100 %] 100 % (09/21 0741)  Hemodynamic parameters for last 24 hours:    Intake/Output from previous day: 09/20 0701 - 09/21 0700 In: 4353.3 [IV Piggyback:4353.3] Out: 1800 [Urine:1800] Intake/Output this shift: No intake/output data recorded.  Physical Exam: General appearance:alert, cooperative andnodistress Wound:The wound is dry and granulating but still has some exposed muscle along the superior rim.  The depth appears to be decreasing more quickly than length and width.   Lab Results: Recent Labs    08/18/19 0655  WBC 5.3  HGB 9.1*  HCT 27.7*  PLT 396   BMET:  Recent Labs    08/18/19 0655  NA 139  K 3.2*  CL 100  CO2 30  GLUCOSE 101*  BUN 9  CREATININE 0.68  CALCIUM 8.0*    PT/INR: No results for input(s): LABPROT, INR in the last 72 hours. ABG    Component Value Date/Time   PHART 7.482 (H) 08/01/2019 0909   HCO3 31.0 (H) 08/01/2019 0909   TCO2 32 08/01/2019 0909   O2SAT 97.0 08/01/2019 0909   CBG (last 3)  Recent Labs    08/19/19 1637 08/19/19 2120 08/20/19 0650  GLUCAP 134* 242* 177*    Assessment/Plan: S/P Procedure(s) (LRB): WOUND DEBRIDEMENT (N/A) WOUND VAC CHANGE (N/A)  -POD-18incision and drainage of the left Overly joint abscess and subsequent wound vac placement and POD10excisional debridement and vac change in the OR. Wound examined during vac changetodayand appears clean and is continuing to granulate nicely.It will benefit from continued vac therapy the remainder of this week. Will then consider changing to moist to dry dressing changes.ABX per  ID.We will continue to follow. Recheck CXR in am to assure pleural fluid is not re-accumulating.    LOS: 19 days   Antony Odea, [place] [phone] 08/20/2019  plan to continue current wound treatment  I  [place] and agree with the above assessment  and plan.  Grace Isaac MD [facility] [phone] Office [phone] 08/20/2019 3:33 PM

## 2019-08-20 NOTE — Progress Notes (Signed)
PROGRESS NOTE  William [name]  DOB: 1980-12-02  PCP: Patient, No Pcp Per WUJ:[id number]  DOA: 07/31/2019  LOS: 19 days   Brief narrative: [age] year old no previous medical history, heroin injection for last 10 years presented to emergency room on 9/1 with multiple complaints including lower back pain, left shoulder pain, right foot and heel pain. In the emergency room, he has right heel abscess, left upper chest wall abscess and mass, CT scan noted left anterior chest wall soft tissue and intramuscular infection with extension into the thoracic cavity, air in the soft tissues of the right axilla adjacent to the right shoulder, multiple small pulmonary nodules. Patient was admitted to ICU. Underwent surgical debridement of the chest wall abscess and wound VAC placement. Also found to have new diagnosis of diabetes. MRI showed multiple epidural and paraspinal abscesses. Blood cultures 9/2: MSSA Blood cultures 9/3: no growth so far. Wound culture 9/2: MSSA. Right heel culture 9/3: Abundant WBC. MSSA. Repeat c/s/ : 9/3,9/6 negative so far.  Clinically improving.   Subjective: Patient was seen and examined.  No overnight events. Remains afebrile. Blood sugars are well under control.  His knees hurt but no swelling or erythema.   Assessment/Plan: Sepsis present on admission with MSSA bacteremia due to IV drug use: Septic pulmonary emboli, Left chest wall abscess, Right heel abscess, Extensive right posterolateral epidural abscess L2-L3, Osteomyelitis of the L5-S1, Posterior epidural abscess L5-S1 S2. - 9/11, patient underwent incision and drainage and debridement left chest wall on wound VAC, followed by CT CS. -Bedside I&D right heel, MSSA. -Multiple epidural abscess and discitis, seen by neurosurgery, recommended no surgical drainage but antibiotics. - followed by infectious disease. He was on cefazolin and metronidazole. Now remains on IV nafcillin. Repeat cultures were negative. -  Intraoperative TEE with no evidence of endocarditis. - Anticipate intravenous antibiotics for about 8 weeks.   Right shoulder septic bursitis 9/10, patient complained of stiffness of right shoulder.  CT scan showed septic bursitis without evidence of septic arthritis or osteomyelitis.  Orthopedic consult appreciated.  Recommended to continue IV antibiotics.  Also underwent arthrocentesis on 9/14.  Fluid analysis showed high WBC count 87,000 without bacteria.  Continue IV antibiotics.  IV drug NFA:OZHYQMVuse:Ongoing opiate use. Patient is motivated to quit. Continue provide symptomatic treatment. Opiates for pain relief for postoperative pain. Continue Tylenol and muscle relaxants. Patient to use Xanax for craving and anxiety. Patient is using some oxycodone with postop pain, will gradually taper it off after wound VAC removal.  New onset diabetes:A1c 7.8. Started on insulin. Stabilizing on increased dose of insulin.  Currently on Levemir to 20 units and prandial insulin.  Hypokalemia-persistently low.  Keep on a schedule replacement. Random urine potassium level high, on  Potassium supplement. No hypertension suggesting aldosteronism.   Insomnia -improving with Remeron at bedtime.  Mobility: Encourage ambulation Diet: Diabetic diet DVT prophylaxis: Heparin subcu Code Status: Full Code  Family Communication:None. Expected Discharge: Continue inpatient management  Consultants:  PCCM, transfer out  CT CS, following  Neurosurgery, signed off  Orthopedics, signed off  ID  Procedures:  Chest wall abscess incision and drainage, 08/02/2019  Right heel incision and drainage, 08/03/2019  Antimicrobials:   Vancomycin, 08/01/2019>>> 08/02/2019  Cefepime, 08/01/2019>>> 08/02/2019  Cefazolin, 08/02/2019>>>>> 08/06/2019  Flagyl, 08/02/2019>>> 08/06/2019  Diflucan, on 08/03/2019 one dose  Nystatin swish and swallow, 08/03/2019 >>>  Nafcillin 08/06/2019>>>>  Infusions:  . lactated ringers 10  mL/hr at 08/10/19 0845  . nafcillin IV 2 g (08/20/19 1004)  Scheduled Meds: . Chlorhexidine Gluconate Cloth  6 each Topical Daily  . docusate sodium  100 mg Oral Daily  . enoxaparin (LOVENOX) injection  40 mg Subcutaneous Q24H  . feeding supplement (GLUCERNA SHAKE)  237 mL Oral TID BM  . feeding supplement (PRO-STAT SUGAR FREE 64)  30 mL Oral Daily  . insulin aspart  0-15 Units Subcutaneous TID WC  . insulin aspart  0-5 Units Subcutaneous QHS  . insulin aspart  5 Units Subcutaneous TID WC  . insulin glargine  20 Units Subcutaneous Daily  . magnesium oxide  400 mg Oral BID  . Melatonin  6 mg Oral Once  . metoprolol tartrate  12.5 mg Oral BID  . nystatin  5 mL Oral QID  . ondansetron  4 mg Intravenous Once  . potassium chloride  10 mEq Oral TID  . sodium chloride flush  10-40 mL Intracatheter Q12H    PRN meds: acetaminophen, ALPRAZolam, calcium carbonate, ondansetron (ZOFRAN) IV, oxyCODONE, promethazine, sodium chloride flush, traZODone   Objective: Vitals:   08/20/19 0509 08/20/19 0741  BP: (!) 134/98 136/87  Pulse: 97 95  Resp: 18 16  Temp: 99.2 F (37.3 C) 97.9 F (36.6 C)  SpO2: 100% 100%    Intake/Output Summary (Last 24 hours) at 08/20/2019 1028 Last data filed at 08/20/2019 4010 Gross per 24 hour  Intake 4353.33 ml  Output 1800 ml  Net 2553.33 ml   Filed Weights   08/10/19 0433 08/11/19 0401 08/15/19 0500  Weight: 78.6 kg 80.6 kg 77.1 kg   Weight change:  Body mass index is 21.82 kg/m.   Physical Exam: General exam: Appears calm and comfortable.   Skin: No rashes, lesions or ulcers. HEENT: Atraumatic, normocephalic, supple neck, no obvious bleeding Lungs: Clear to auscultate bilaterally.  Left chest wall wound VAC with minimal serosanguineous fluid in the canister. CVS: Regular rate and rhythm, no murmur GI/Abd soft, nontender, nondistended, bowel sound present CNS: Alert, awake, oriented x3 Psychiatry: Mood appropriate. Extremities: No pedal edema,  no calf tenderness  Data Review: I have personally reviewed the laboratory data and studies available.  Recent Labs  Lab 08/15/19 1442 08/18/19 0655  WBC 6.7 5.3  NEUTROABS 5.6 4.0  HGB 8.0* 9.1*  HCT 24.0* 27.7*  MCV 94.1 95.2  PLT 446* 396   Recent Labs  Lab 08/14/19 0500 08/15/19 1442 08/18/19 0655  NA 140 137 139  K 2.7* 2.9* 3.2*  CL 103 98 100  CO2 29 28 30   GLUCOSE 120* 186* 101*  BUN 8 8 9   CREATININE 0.71 0.64 0.68  CALCIUM 7.7* 7.9* 8.0*  MG  --  1.8 2.0  PHOS  --  3.7 3.9    Total time spent: 25 minutes for direct patient care.  Barb Merino, MD  [facility] 08/20/2019

## 2019-08-20 NOTE — Progress Notes (Signed)
Physical Therapy Treatment Patient Details Name: Sherlon Handingaylor L Napoleon MRN: [id number] DOB: September 20, 1981 Today's Date: 08/20/2019    History of Present Illness Patient is a 38 y/o male who presents with lower back pain, left shoulder pain, right foot and heel pain. Found to have right heel abscess s/p I&D 9/4, left upper chest wall abscess and mass s/p I&D and wound vac application 9/3, new onset DM, sepsis with MSSA bacteremia due to IV drug use. CT scan- left anterior chest wall soft tissue and intramuscular infection with extension into the thoracic cavity, air in the soft tissues of the right axilla adjacent to the right shoulder, multiple small pulmonary nodules. PMH- heroin IV drug use.    PT Comments    Patient received in bed, mother present. Patient agreeable to PT session. Reports back pain, states he had horrible back spasms yesterday and is fearful of them returning. Patient requires min assist for raising trunk up to seated position. Performs sit to stand with min guard from elevated bed. Patient is very deliberate with movements and requires increased time and effort. Patient then ambulated 300 feet with RW and min guard assist/supervision. Slow cadence and rigid posture. Patient will benefit from continued skilled PT to improve activity tolerance and strength.       Follow Up Recommendations  Home health PT;Supervision for mobility/OOB     Equipment Recommendations  Rolling walker with 5" wheels    Recommendations for Other Services       Precautions / Restrictions Precautions Precautions: Fall Precaution Comments: mult lines and weak Restrictions Weight Bearing Restrictions: No    Mobility  Bed Mobility Overal bed mobility: Needs Assistance Bed Mobility: Supine to Sit     Supine to sit: Min assist;HOB elevated Sit to supine: Min guard;HOB elevated   General bed mobility comments: Patient required assist to raise trunk up to seated position.  Transfers Overall transfer  level: Needs assistance Equipment used: Rolling walker (2 wheeled)   Sit to Stand: Min guard;From elevated surface            Ambulation/Gait Ambulation/Gait assistance: Min guard Gait Distance (Feet): 300 Feet Assistive device: Rolling walker (2 wheeled) Gait Pattern/deviations: Step-through pattern;Decreased stride length Gait velocity: decreased   General Gait Details: careful with mobility and using RW to support the activity, labored   Stairs             Wheelchair Mobility    Modified Rankin (Stroke Patients Only)       Balance Overall balance assessment: Needs assistance Sitting-balance support: Feet supported Sitting balance-Leahy Scale: Good Sitting balance - Comments: Requires BUE support as position of comfort.   Standing balance support: Bilateral upper extremity supported;During functional activity Standing balance-Leahy Scale: Good                              Cognition Arousal/Alertness: Awake/alert Behavior During Therapy: WFL for tasks assessed/performed Overall Cognitive Status: Within Functional Limits for tasks assessed                         Following Commands: Follows one step commands consistently              Exercises      General Comments        Pertinent Vitals/Pain Pain Assessment: Faces Faces Pain Scale: Hurts little more Pain Location: back Pain Descriptors / Indicators: Aching;Grimacing;Guarding;Discomfort;Sore Pain Intervention(s): Monitored during session;Repositioned;Limited activity within  patient's tolerance    Home Living                      Prior Function            PT Goals (current goals can now be found in the care plan section) Acute Rehab PT Goals Patient Stated Goal: to get home PT Goal Formulation: With patient Time For Goal Achievement: 08/27/19 Potential to Achieve Goals: Good Progress towards PT goals: Progressing toward goals    Frequency    Min  3X/week      PT Plan Current plan remains appropriate    Co-evaluation              AM-PAC PT "6 Clicks" Mobility   Outcome Measure  Help needed turning from your back to your side while in a flat bed without using bedrails?: None Help needed moving from lying on your back to sitting on the side of a flat bed without using bedrails?: A Little Help needed moving to and from a bed to a chair (including a wheelchair)?: A Little Help needed standing up from a chair using your arms (e.g., wheelchair or bedside chair)?: A Little Help needed to walk in hospital room?: A Little Help needed climbing 3-5 steps with a railing? : A Little 6 Click Score: 19    End of Session Equipment Utilized During Treatment: Gait belt Activity Tolerance: Patient tolerated treatment well;Patient limited by fatigue Patient left: in bed;with call bell/phone within reach;with family/visitor present Nurse Communication: Mobility status PT Visit Diagnosis: Muscle weakness (generalized) (M62.81);Pain;Difficulty in walking, not elsewhere classified (R26.2) Pain - part of body: (back)     Time: 1255-1320 PT Time Calculation (min) (ACUTE ONLY): 25 min  Charges:  $Gait Training: 23-37 mins                     Tashawn Greff, PT, GCS 08/20/19,2:06 PM

## 2019-08-21 ENCOUNTER — Inpatient Hospital Stay (HOSPITAL_COMMUNITY): Payer: BC Managed Care – PPO

## 2019-08-21 DIAGNOSIS — F4310 Post-traumatic stress disorder, unspecified: Secondary | ICD-10-CM | POA: Diagnosis not present

## 2019-08-21 DIAGNOSIS — R6520 Severe sepsis without septic shock: Secondary | ICD-10-CM | POA: Diagnosis not present

## 2019-08-21 DIAGNOSIS — A419 Sepsis, unspecified organism: Secondary | ICD-10-CM | POA: Diagnosis not present

## 2019-08-21 DIAGNOSIS — J90 Pleural effusion, not elsewhere classified: Secondary | ICD-10-CM | POA: Diagnosis not present

## 2019-08-21 DIAGNOSIS — N179 Acute kidney failure, unspecified: Secondary | ICD-10-CM | POA: Diagnosis not present

## 2019-08-21 LAB — GLUCOSE, CAPILLARY
Glucose-Capillary: 126 mg/dL — ABNORMAL HIGH (ref 70–99)
Glucose-Capillary: 148 mg/dL — ABNORMAL HIGH (ref 70–99)
Glucose-Capillary: 164 mg/dL — ABNORMAL HIGH (ref 70–99)
Glucose-Capillary: 99 mg/dL (ref 70–99)

## 2019-08-21 MED ORDER — SODIUM CHLORIDE 0.9 % IV SOLN
INTRAVENOUS | Status: DC | PRN
  Administered 2019-08-21 – 2019-08-24 (×4): 1000 mL via INTRAVENOUS

## 2019-08-21 NOTE — Progress Notes (Signed)
PROGRESS NOTE  William [name]  DOB: [birth year]  PCP: Patient, No Pcp Per PPJ:[id number]  DOA: 07/31/2019  LOS: 20 days   Brief narrative: 38 year old no previous medical history, heroin injection for last 10 years presented to emergency room on 9/1 with multiple complaints including lower back pain, left shoulder pain, right foot and heel pain. In the emergency room, he had right heel abscess, left upper chest wall abscess and mass, CT scan noted left anterior chest wall soft tissue and intramuscular infection with extension into the thoracic cavity, air in the soft tissues of the right axilla adjacent to the right shoulder, multiple small pulmonary nodules. Patient was admitted to ICU. Underwent surgical debridement of the chest wall abscess and wound VAC placement. Also found to have new diagnosis of diabetes. MRI showed multiple epidural and paraspinal abscesses. Blood cultures 9/2: MSSA Blood cultures 9/3: no growth so far. Wound culture 9/2: MSSA. Right heel culture 9/3: Abundant WBC. MSSA. Repeat c/s/ : 9/3,9/6 negative so far.  Clinically improving.   Subjective: Patient was seen and examined.  No overnight events. Remains afebrile. Blood sugars are well under control.  lovenox hurts.   Assessment/Plan: Sepsis present on admission with MSSA bacteremia due to IV drug use: Septic pulmonary emboli, Left chest wall abscess, Right heel abscess, Extensive right posterolateral epidural abscess L2-L3, Osteomyelitis of the L5-S1, Posterior epidural abscess L5-S1 S2. - 9/11, patient underwent incision and drainage and debridement left chest wall on wound VAC, followed by CT CS. -Bedside I&D right heel, MSSA. -Multiple epidural abscess and discitis, seen by neurosurgery, recommended no surgical drainage but antibiotics. - followed by infectious disease. He was on cefazolin and metronidazole. Now remains on IV nafcillin. Repeat cultures were negative. - Intraoperative TEE with no  evidence of endocarditis. - Anticipate intravenous antibiotics for 8 weeks , 10/28 as per ID recommendation for total 8 weeks starting from 07/31/2019  Right shoulder septic bursitis 9/10, patient complained of stiffness of right shoulder.  CT scan showed septic bursitis without evidence of septic arthritis or osteomyelitis.  Orthopedic consult appreciated.  Recommended to continue IV antibiotics.  Also underwent arthrocentesis on 9/14.  Fluid analysis showed high WBC count 87,000 without bacteria.  Continue IV antibiotics.  IV drug PYK:DXIPJAS opiate use. Patient is motivated to quit. Continue provide symptomatic treatment. Opiates for pain relief for postoperative pain. Continue Tylenol and muscle relaxants. Patient is using some oxycodone with postop pain, will gradually taper it off after wound VAC removal ;ater this week.   New onset diabetes:A1c 7.8. Started on insulin. Stabilizing on increased dose of insulin.  Currently on Levemir to 20 units and prandial insulin.  Hypokalemia-persistently low.  Keep on a schedule replacement. Random urine potassium level high, on  Potassium supplement. No hypertension suggesting aldosteronism.   Insomnia -improving with trazodone at bedtime.  Mobility: Encourage ambulation Diet: Diabetic diet DVT prophylaxis: Heparin subcu Code Status: Full Code  Family Communication:None. Expected Discharge: Continue inpatient management  Consultants:  PCCM, transfer out  CT CS, following  Neurosurgery, signed off  Orthopedics, signed off  ID  Procedures:  Chest wall abscess incision and drainage, 08/02/2019  Right heel incision and drainage, 08/03/2019  Antimicrobials:   Vancomycin, 08/01/2019>>> 08/02/2019  Cefepime, 08/01/2019>>> 08/02/2019  Cefazolin, 08/02/2019>>>>> 08/06/2019  Flagyl, 08/02/2019>>> 08/06/2019  Diflucan, on 08/03/2019 one dose  Nystatin swish and swallow, 08/03/2019 >>>  Nafcillin 08/06/2019>>>>  Infusions:  . lactated  ringers 10 mL/hr at 08/10/19 0845  . nafcillin IV 2 g (08/21/19 0616)  Scheduled Meds: . Chlorhexidine Gluconate Cloth  6 each Topical Daily  . docusate sodium  100 mg Oral Daily  . enoxaparin (LOVENOX) injection  40 mg Subcutaneous Q24H  . feeding supplement (GLUCERNA SHAKE)  237 mL Oral TID BM  . feeding supplement (PRO-STAT SUGAR FREE 64)  30 mL Oral Daily  . insulin aspart  0-15 Units Subcutaneous TID WC  . insulin aspart  0-5 Units Subcutaneous QHS  . insulin aspart  5 Units Subcutaneous TID WC  . insulin glargine  20 Units Subcutaneous Daily  . magnesium oxide  400 mg Oral BID  . Melatonin  6 mg Oral Once  . metoprolol tartrate  12.5 mg Oral BID  . nystatin  5 mL Oral QID  . ondansetron  4 mg Intravenous Once  . potassium chloride  10 mEq Oral TID  . sodium chloride flush  10-40 mL Intracatheter Q12H    PRN meds: acetaminophen, calcium carbonate, ondansetron (ZOFRAN) IV, oxyCODONE, promethazine, sodium chloride flush, traZODone   Objective: Vitals:   08/21/19 0339 08/21/19 0845  BP: 135/90 127/81  Pulse: 87 94  Resp: 18 20  Temp: 99.8 F (37.7 C) 98.3 F (36.8 C)  SpO2: 100% 100%    Intake/Output Summary (Last 24 hours) at 08/21/2019 1111 Last data filed at 08/21/2019 0616 Gross per 24 hour  Intake -  Output 1525 ml  Net -1525 ml   Filed Weights   08/10/19 0433 08/11/19 0401 08/15/19 0500  Weight: 78.6 kg 80.6 kg 77.1 kg   Weight change:  Body mass index is 21.82 kg/m.   Physical Exam: General exam: Appears calm and comfortable.   Skin: No rashes, lesions or ulcers. HEENT: Atraumatic, normocephalic, supple neck, no obvious bleeding Lungs: Clear to auscultate bilaterally.  Left chest wall wound VAC with minimal serosanguineous fluid in the canister. CVS: Regular rate and rhythm, no murmur GI/Abd soft, nontender, nondistended, bowel sound present CNS: Alert, awake, oriented x3 Psychiatry: Mood appropriate. Extremities: No pedal edema, no calf  tenderness  Data Review: I have personally reviewed the laboratory data and studies available.  Recent Labs  Lab 08/15/19 1442 08/18/19 0655  WBC 6.7 5.3  NEUTROABS 5.6 4.0  HGB 8.0* 9.1*  HCT 24.0* 27.7*  MCV 94.1 95.2  PLT 446* 396   Recent Labs  Lab 08/15/19 1442 08/18/19 0655  NA 137 139  K 2.9* 3.2*  CL 98 100  CO2 28 30  GLUCOSE 186* 101*  BUN 8 9  CREATININE 0.64 0.68  CALCIUM 7.9* 8.0*  MG 1.8 2.0  PHOS 3.7 3.9    Total time spent: 25 minutes for direct patient care.  Barb Merino, MD  [facility] 08/21/2019

## 2019-08-22 LAB — GLUCOSE, CAPILLARY
Glucose-Capillary: 215 mg/dL — ABNORMAL HIGH (ref 70–99)
Glucose-Capillary: 152 mg/dL — ABNORMAL HIGH (ref 70–99)
Glucose-Capillary: 135 mg/dL — ABNORMAL HIGH (ref 70–99)
Glucose-Capillary: 193 mg/dL — ABNORMAL HIGH (ref 70–99)

## 2019-08-22 MED ORDER — CYCLOBENZAPRINE HCL 5 MG PO TABS
7.5000 mg | ORAL_TABLET | Freq: Three times a day (TID) | ORAL | Status: DC
  Administered 2019-08-22 – 2019-08-23 (×4): 7.5 mg via ORAL
  Filled 2019-08-22 (×6): qty 1.5

## 2019-08-22 MED ORDER — ALPRAZOLAM 0.25 MG PO TABS
0.2500 mg | ORAL_TABLET | Freq: Once | ORAL | Status: AC
  Administered 2019-08-22: 0.25 mg via ORAL
  Filled 2019-08-22: qty 1

## 2019-08-22 MED ORDER — ENSURE MAX PROTEIN PO LIQD
11.0000 [oz_av] | Freq: Two times a day (BID) | ORAL | Status: DC
  Administered 2019-08-22 – 2019-09-08 (×13): 11 [oz_av] via ORAL
  Filled 2019-08-22 (×40): qty 330

## 2019-08-22 NOTE — Progress Notes (Signed)
Nutrition Follow-up  DOCUMENTATION CODES:   Not applicable  INTERVENTION:  Discontinue Glucerna and Prostat.   Provide Ensure Max po BID, each supplement provides 150 kcal and 30 grams of protein.   Encourage adequate PO intake.   NUTRITION DIAGNOSIS:   Increased nutrient needs related to wound healing as evidenced by estimated needs; ongoing  GOAL:   Patient will meet greater than or equal to 90% of their needs;  MONITOR:   PO intake, Supplement acceptance, Skin, Weight trends, Labs, I & O's  REASON FOR ASSESSMENT:   Consult Diet education  ASSESSMENT:   38 year old no previous medical history, heroin injection for last 10 years presented to emergency room on 9/1 with multiple complaints including lower back pain, left shoulder pain, right foot and heel pain. Pt with sepsis present on admission with MSSA bacteremia due to IV drug use. Septic pulmonary emboli, Left chest wall abscess, Right heel abscess, Extensive right posterolateral epidural abscess L2-L3, Osteomyelitis of the L5-S1, Posterior epidural abscess L5-S1 S2. Status post incision and drainage and debridement left chest wall on wound VAC9/3. S/p R heel I&D 9/4.  Procedure (9/11): wound debridement and wound VAC change   Meal completion has been 100%. Pt currently has Glucerna shake and Prostat ordered however has been refusing them recently. RD to order Ensure Max instead to aid in caloric and protein needs as well as in healing. Labs and medications reviewed.   Diet Order:   Diet Order            Diet regular Room service appropriate? Yes; Fluid consistency: Thin  Diet effective now              EDUCATION NEEDS:   Education needs have been addressed  Skin:  Skin Assessment: Skin Integrity Issues: Skin Integrity Issues:: Incisions, Wound VAC Wound Vac: L chest Incisions: L chest, R ankle  Last BM:  9/22  Height:   Ht Readings from Last 1 Encounters:  08/01/19 6\' 2"  (1.88 m)    Weight:    Wt Readings from Last 1 Encounters:  08/15/19 77.1 kg    Ideal Body Weight:  86.36 kg  BMI:  Body mass index is 21.82 kg/m.  Estimated Nutritional Needs:   Kcal:  2150-2350  Protein:  105-120 grams  Fluid:  >/= 2.1 L/day    Corrin Parker, MS, RD, LDN Pager # [phone] After hours/ weekend pager # [phone]

## 2019-08-22 NOTE — Progress Notes (Signed)
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  imaging= right heel abscess, left upper chest wall abscess and mass, CT scan noted left anterior chest wall soft tissue and intramuscular infection with extension into the thoracic cavity, air in the soft tissues of the right axilla adjacent to the right shoulder, multiple small pulmonary nodules.  Patient was admitted to ICU. Underwent surgical debridement of the chest wall abscess and wound VAC placement.  Also found to have new diagnosis of diabetes. MRI showed multiple epidural and paraspinal abscesses. Blood cultures 9/2: MSSA Blood cultures 9/3: no growth so far. Wound culture 9/2: MSSA. Right heel culture 9/3: Abundant WBC. MSSA. Repeat c/s/ : 9/3,9/6 negative so far.  Clinically improving.   Subjective:  Has LBP x 2 days and spasm Tells me felt very weak this am on awakening  Assessment/Plan: Sepsis present on admission with MSSA bacteremia due to IV drug use: Septic pulmonary emboli, Left chest wall abscess, Right heel abscess, Extensive right posterolateral epidural abscess L2-L3, Osteomyelitis of the L5-S1, Posterior epidural abscess L5-S1 S2. - 9/11, patient underwent incision and drainage and debridement left chest wall on wound VAC, followed by CT CS. -Bedside I&D right heel, MSSA. -Multiple epidural abscess and discitis, seen byNS--onjly Abx, no Surg--Adding flexeril 7.5 tid - followed by infectious disease. He was on cefazolin and metronidazole. Now remains on IV nafcillin. Repeat cultures were negative. - Intraoperative TEE with no evidence of endocarditis. - Anticipate intravenous antibiotics for 8 weeks , 10/28 as per ID recommendation for total 8 weeks starting  from 07/31/2019  Right shoulder septic bursitis 9/10, patient complained of stiffness of right shoulder.  CT scan showed septic bursitis without evidence of septic arthritis or osteomyelitis.  Orthopedic consult appreciated.  Recommended to continue IV antibiotics.  Also underwent arthrocentesis on 9/14.  Fluid analysis showed high WBC count 87,000 without bacteria.  Continue IV antibiotics.  IV drug ION:GEXBMWU opiate use. Patient is motivated to quit. Continue provide symptomatic treatment. Opiates for pain relief for postoperative pain. Continue Tylenol and muscle relaxants. Patient is using some oxycodone with postop pain, will gradually taper it off after wound VAC removal.   New onset diabetes:A1c 7.8. Started on insulin. CBG 100-215, on reg dist Currently on Levemir to 20 units and prandial insulin.  Hypokalemia-persistently low.  Keep on a schedule replacement. Random urine potassium level high, on  Potassium supplement. No hypertension suggesting aldosteronism.  Labs am-refusing today  Insomnia -improving with trazodone at bedtime.  Mobility: Encourage ambulation Diet: Diabetic diet DVT prophylaxis: Lovenox Code Status: Full Code  Family Communication:long discussion with mother at the bedsdie Expected Discharge: Continue inpatient management  Consultants:  PCCM, transfer out  CT CS, following  Neurosurgery, signed off  Orthopedics, signed off  ID  Procedures:  Chest wall abscess incision and drainage, 08/02/2019  Right heel incision and drainage, 08/03/2019  Antimicrobials:   Vancomycin, 08/01/2019>>> 08/02/2019  Cefepime, 08/01/2019>>> 08/02/2019  Cefazolin, 08/02/2019>>>>> 08/06/2019  Flagyl, 08/02/2019>>> 08/06/2019  Diflucan, on 08/03/2019 one dose  Nystatin swish and swallow, 08/03/2019 >>>  Nafcillin 08/06/2019>>>>  Infusions:  . sodium chloride Stopped (08/22/19 0344)  . lactated ringers 10 mL/hr at 08/10/19 0845  . nafcillin IV 2 g (08/22/19 1009)     Scheduled Meds: .  Chlorhexidine Gluconate Cloth  6 each Topical Daily  . cyclobenzaprine  7.5 mg Oral TID  . docusate sodium  100 mg Oral Daily  . enoxaparin (LOVENOX) injection  40 mg Subcutaneous Q24H  . feeding supplement (GLUCERNA SHAKE)  237 mL Oral TID BM  . feeding supplement (PRO-STAT SUGAR FREE 64)  30 mL Oral Daily  . insulin aspart  0-15 Units Subcutaneous TID WC  . insulin aspart  0-5 Units Subcutaneous QHS  . insulin aspart  5 Units Subcutaneous TID WC  . insulin glargine  20 Units Subcutaneous Daily  . magnesium oxide  400 mg Oral BID  . Melatonin  6 mg Oral Once  . metoprolol tartrate  12.5 mg Oral BID  . nystatin  5 mL Oral QID  . ondansetron  4 mg Intravenous Once  . potassium chloride  10 mEq Oral TID  . sodium chloride flush  10-40 mL Intracatheter Q12H    PRN meds: sodium chloride, acetaminophen, calcium carbonate, ondansetron (ZOFRAN) IV, oxyCODONE, promethazine, sodium chloride flush, traZODone   Objective: Vitals:   08/22/19 0618 08/22/19 0727  BP: 121/76 126/87  Pulse: 92 92  Resp: 16 20  Temp: 98.9 F (37.2 C) 98.9 F (37.2 C)  SpO2: 100% 100%    Intake/Output Summary (Last 24 hours) at 08/22/2019 1154 Last data filed at 08/22/2019 0745 Gross per 24 hour  Intake 1692.86 ml  Output 850 ml  Net 842.86 ml   Filed Weights   08/10/19 0433 08/11/19 0401 08/15/19 0500  Weight: 78.6 kg 80.6 kg 77.1 kg   Weight change:  Body mass index is 21.82 kg/m.   Physical Exam: Awake coherent anxious on nad eomi ncat No ict no pallor cta b no added sound abd soft nt nd no rebound no guard Spasm and pain with passive SLR R> L LE Reflexes 2/3 Power 5/5 but limited by pain Skin soft supple  Data Review: I have personally reviewed the laboratory data and studies available.  Recent Labs  Lab 08/15/19 1442 08/18/19 0655  WBC 6.7 5.3  NEUTROABS 5.6 4.0  HGB 8.0* 9.1*  HCT 24.0* 27.7*  MCV 94.1 95.2  PLT 446* 396   Recent Labs  Lab 08/15/19  1442 08/18/19 0655  NA 137 139  K 2.9* 3.2*  CL 98 100  CO2 28 30  GLUCOSE 186* 101*  BUN 8 9  CREATININE 0.64 0.68  CALCIUM 7.9* 8.0*  MG 1.8 2.0  PHOS 3.7 3.9    Total time spent: 35 minutes for direct patient care.  Nita Sells, MD  [facility] 08/22/2019

## 2019-08-22 NOTE — Progress Notes (Signed)
Pt scheduled Flexeril dose was 7.5 mg but at patients request he only wanted to take 5 mg. RN will waste the rest of the dosage with Arta Silence, RN.

## 2019-08-22 NOTE — Progress Notes (Signed)
12 Days Post-Op Procedure(s) (LRB): WOUND DEBRIDEMENT (N/A) WOUND VAC CHANGE (N/A) Subjective: William [name] was seen during vac change this morning.  Pt awake and alert.  No new concerns.    Objective: Vital signs in last 24 hours: Temp:  [98.3 F (36.8 C)-98.9 F (37.2 C)] 98.4 F (36.9 C) (09/23 1205) Pulse Rate:  [88-101] 88 (09/23 1205) Resp:  [16-20] 20 (09/23 1205) BP: (121-133)/(76-92) 131/80 (09/23 1205) SpO2:  [100 %] 100 % (09/23 1205)  Hemodynamic parameters for last 24 hours:    Intake/Output from previous day: 09/22 0701 - 09/23 0700 In: 1692.9 [P.O.:120; I.V.:30.3; IV Piggyback:1542.6] Out: 300 [Urine:300] Intake/Output this shift: Total I/O In: -  Out: 550 [Urine:550]  Physical Exam: General appearance:alert, cooperative andnodistress Wound:The wound is dry and granulating well on all surfaces and is contracting.   Lab Results: No results for input(s): WBC, HGB, HCT, PLT in the last 72 hours. BMET: No results for input(s): NA, K, CL, CO2, GLUCOSE, BUN, CREATININE, CALCIUM in the last 72 hours.  PT/INR: No results for input(s): LABPROT, INR in the last 72 hours. ABG    Component Value Date/Time   PHART 7.482 (H) 08/01/2019 0909   HCO3 31.0 (H) 08/01/2019 0909   TCO2 32 08/01/2019 0909   O2SAT 97.0 08/01/2019 0909   CBG (last 3)  Recent Labs    08/21/19 2051 08/22/19 0615 08/22/19 1202  GLUCAP 99 215* 152*    EXAM: CHEST - 2 VIEW  COMPARISON:  Seven days ago  FINDINGS: Left chest tube has been removed. A small left pleural effusion is present with slight increase. No visible pneumothorax. Normal heart size and mediastinal contours.  IMPRESSION: Small left pleural effusion with minimal increase from 7 days ago.   Electronically Signed   By: Monte Fantasia M.D.   On: 08/21/2019 09:42   Assessment/Plan: S/P Procedure(s) (LRB): WOUND DEBRIDEMENT (N/A) WOUND VAC CHANGE (N/A)  -POD-20incision and drainage of the left [place]  joint abscess and subsequent wound vac placement and POD10excisional debridement and vac change in the OR.  Wound examined during vac changetodayand appears clean and is continuing to granulate nicely.It will benefit from continued vac therapy the remainder of this week.Will then consider changing to moist to dry dressing changes.ABX per ID.We will continue to follow. Re-assess wound on Friday.    LOS: 21 days    Antony Odea, [place] [phone] 08/22/2019

## 2019-08-22 NOTE — Progress Notes (Signed)
Physical Therapy Treatment Patient Details Name: William [name] MRN: [id number] DOB: [birth year] Today's Date: 08/22/2019    History of Present Illness Patient is a 38 y/o male who presents with lower back pain, left shoulder pain, right foot and heel pain. Found to have right heel abscess s/p I&D 9/4, left upper chest wall abscess and mass s/p I&D and wound vac application 9/3, new onset DM, sepsis with MSSA bacteremia due to IV drug use. CT scan- left anterior chest wall soft tissue and intramuscular infection with extension into the thoracic cavity, air in the soft tissues of the right axilla adjacent to the right shoulder, multiple small pulmonary nodules. PMH- heroin IV drug use.    PT Comments    Patient seen for mobility progression. Pt requires min guard for bed mobility and gait training and min A for sit to stand transfer. Pt reports R should pain and ROM continue to improve and tolerated gait distance of 300 ft. Continue to progress as tolerated.     Follow Up Recommendations  Home health PT;Supervision for mobility/OOB     Equipment Recommendations  Rolling walker with 5" wheels    Recommendations for Other Services       Precautions / Restrictions Precautions Precautions: Fall Restrictions Weight Bearing Restrictions: No    Mobility  Bed Mobility Overal bed mobility: Needs Assistance Bed Mobility: Rolling;Sidelying to Sit;Sit to Supine Rolling: Min guard Sidelying to sit: Min guard Supine to sit: Min guard     General bed mobility comments: cues for log roll technique; min guard for safety and use of rail  Transfers Overall transfer level: Needs assistance Equipment used: Rolling walker (2 wheeled) Transfers: Sit to/from Stand Sit to Stand: From elevated surface;Min assist         General transfer comment: pt insistent on using cane on R side to power up into standing and then use RW upon standing; assistance required on L side to power up into standing    Ambulation/Gait Ambulation/Gait assistance: Min guard;Supervision Gait Distance (Feet): 300 Feet Assistive device: Rolling walker (2 wheeled) Gait Pattern/deviations: Step-through pattern;Decreased stride length     General Gait Details: guarded but steady gait   Stairs             Wheelchair Mobility    Modified Rankin (Stroke Patients Only)       Balance Overall balance assessment: Needs assistance Sitting-balance support: Feet supported Sitting balance-Leahy Scale: Good     Standing balance support: Bilateral upper extremity supported;During functional activity Standing balance-Leahy Scale: Poor                              Cognition Arousal/Alertness: Awake/alert Behavior During Therapy: WFL for tasks assessed/performed Overall Cognitive Status: Within Functional Limits for tasks assessed                                        Exercises      General Comments General comments (skin integrity, edema, etc.): girlfiend present during session and pt has self limiting behaviors      Pertinent Vitals/Pain Pain Assessment: Faces Faces Pain Scale: Hurts little more Pain Location: back Pain Descriptors / Indicators: Grimacing;Guarding;Discomfort;Sore Pain Intervention(s): Monitored during session;Repositioned    Home Living  Prior Function            PT Goals (current goals can now be found in the care plan section) Acute Rehab PT Goals Patient Stated Goal: to get home Progress towards PT goals: Progressing toward goals    Frequency    Min 3X/week      PT Plan Current plan remains appropriate    Co-evaluation              AM-PAC PT "6 Clicks" Mobility   Outcome Measure  Help needed turning from your back to your side while in a flat bed without using bedrails?: None Help needed moving from lying on your back to sitting on the side of a flat bed without using bedrails?:  None Help needed moving to and from a bed to a chair (including a wheelchair)?: A Little Help needed standing up from a chair using your arms (e.g., wheelchair or bedside chair)?: A Little Help needed to walk in hospital room?: A Little Help needed climbing 3-5 steps with a railing? : A Little 6 Click Score: 20    End of Session Equipment Utilized During Treatment: Gait belt Activity Tolerance: Patient tolerated treatment well Patient left: in bed;with call bell/phone within reach;with family/visitor present;with bed alarm set Nurse Communication: Mobility status PT Visit Diagnosis: Muscle weakness (generalized) (M62.81);Pain;Difficulty in walking, not elsewhere classified (R26.2) Pain - Right/Left: Left Pain - part of body: (back)     Time: 3903-0092 PT Time Calculation (min) (ACUTE ONLY): 25 min  Charges:  $Gait Training: 8-22 mins $Therapeutic Activity: 8-22 mins                     Erline Levine, PTA Acute Rehabilitation Services Pager: [phone] Office: [phone]     Carolynne Edouard 08/22/2019, 5:01 PM

## 2019-08-22 NOTE — Progress Notes (Signed)
PT Cancellation Note  Patient Details Name: William [name] MRN: [id number] DOB: [birth year]   Cancelled Treatment:    Reason Eval/Treat Not Completed: Patient declined, no reason specified Patient declined participating in therapy at this time due to anticipating wound vac change around 11 this am and being afraid to stand due to "passing out" around 4 am when having BM. Pt requested that PT return after 1300 today. PT will continue to follow acutely.    Earney Navy, PTA Acute Rehabilitation Services Pager: [phone] Office: [phone]   08/22/2019, 10:21 AM

## 2019-08-22 NOTE — Consult Note (Signed)
  [place] Nurse wound follow upfor left  joint NPWT dressing change Wound type:full thickness NPWT dressing change.  Patient is premedicated for pain  PA at bedside and evaluates wound Measurement:1.5X6X.5cm,  Wound bed:red and moist Drainagesmall amt pink drainage, no odor Periwound: intact Dressing procedure/placement/frequency: . One piece of black foam removed from wound bed, wound cleansed with NS. One piece of black foam used to fill defect, covered with drape. Attached to 139mmHg continuous negative pressure and an immediate seal is achieved. [place] team will follow for M/W/F dressing change.  Domenic Moras MSN, RN, FNP-BC CWON Wound, Ostomy, Continence Nurse Pager [phone]

## 2019-08-22 NOTE — Progress Notes (Signed)
Patient refused labs this morning. MD Samtani notifed.

## 2019-08-23 LAB — COMPREHENSIVE METABOLIC PANEL
ALT: 14 U/L (ref 0–44)
AST: 15 U/L (ref 15–41)
Albumin: 1.8 g/dL — ABNORMAL LOW (ref 3.5–5.0)
Alkaline Phosphatase: 107 U/L (ref 38–126)
Anion gap: 12 (ref 5–15)
BUN: 13 mg/dL (ref 6–20)
CO2: 26 mmol/L (ref 22–32)
Calcium: 8.6 mg/dL — ABNORMAL LOW (ref 8.9–10.3)
Chloride: 103 mmol/L (ref 98–111)
Creatinine, Ser: 0.75 mg/dL (ref 0.61–1.24)
GFR calc Af Amer: >60 mL/min (ref >60)
GFR calc non Af Amer: >60 mL/min (ref >60)
Glucose, Bld: 125 mg/dL — ABNORMAL HIGH (ref 70–99)
Potassium: 3.9 mmol/L (ref 3.5–5.1)
Sodium: 141 mmol/L (ref 135–145)
Total Bilirubin: 0.6 mg/dL (ref 0.3–1.2)
Total Protein: 6.6 g/dL (ref 6.5–8.1)

## 2019-08-23 LAB — GLUCOSE, CAPILLARY
Glucose-Capillary: 142 mg/dL — ABNORMAL HIGH (ref 70–99)
Glucose-Capillary: 170 mg/dL — ABNORMAL HIGH (ref 70–99)
Glucose-Capillary: 114 mg/dL — ABNORMAL HIGH (ref 70–99)
Glucose-Capillary: 133 mg/dL — ABNORMAL HIGH (ref 70–99)

## 2019-08-23 LAB — CBC WITH DIFFERENTIAL/PLATELET
Abs Immature Granulocytes: 0.03 10*3/uL (ref 0.00–0.07)
Basophils Absolute: 0 10*3/uL (ref 0.0–0.1)
Basophils Relative: 1 %
Eosinophils Absolute: 0.1 10*3/uL (ref 0.0–0.5)
Eosinophils Relative: 3 %
HCT: 28.2 % — ABNORMAL LOW (ref 39.0–52.0)
Hemoglobin: 9.3 g/dL — ABNORMAL LOW (ref 13.0–17.0)
Immature Granulocytes: 1 %
Lymphocytes Relative: 19 %
Lymphs Abs: 1.1 10*3/uL (ref 0.7–4.0)
MCH: 31.5 pg (ref 26.0–34.0)
MCHC: 33 g/dL (ref 30.0–36.0)
MCV: 95.6 fL (ref 80.0–100.0)
Monocytes Absolute: 0.3 10*3/uL (ref 0.1–1.0)
Monocytes Relative: 6 %
Neutro Abs: 4 10*3/uL (ref 1.7–7.7)
Neutrophils Relative %: 70 %
Platelets: 399 10*3/uL (ref 150–400)
RBC: 2.95 MIL/uL — ABNORMAL LOW (ref 4.22–5.81)
RDW: 16.7 % — ABNORMAL HIGH (ref 11.5–15.5)
WBC: 5.6 10*3/uL (ref 4.0–10.5)
nRBC: 0 % (ref 0.0–0.2)

## 2019-08-23 LAB — MAGNESIUM: Magnesium: 1.9 mg/dL (ref 1.7–2.4)

## 2019-08-23 MED ORDER — IBUPROFEN 100 MG PO CHEW
400.0000 mg | CHEWABLE_TABLET | Freq: Three times a day (TID) | ORAL | Status: DC | PRN
  Administered 2019-08-23: 12:00:00 400 mg via ORAL
  Filled 2019-08-23 (×2): qty 4

## 2019-08-23 NOTE — Progress Notes (Signed)
Chaplain visited to continue spiritual care concerning the patients reported drug usage and additional family problems.  The Patient expressed positive emotions concerning the visit.  The chaplain provided supportive presence and conversation improving the patient's mood and outlook.  The chaplain will continue to follow this patient as the patient will be on the unit for an extended point of time.  Brion Aliment Chaplain Resident For questions concerning this note please contact me by pager [phone]

## 2019-08-23 NOTE — Progress Notes (Signed)
13 Days Post-Op Procedure(s) (LRB): WOUND DEBRIDEMENT (N/A) WOUND VAC CHANGE (N/A) Subjective: Stable day wound VAC change yesterday and   Objective: Vital signs in last 24 hours: Temp:  [98.1 F (36.7 C)-99 F (37.2 C)] 98.1 F (36.7 C) (09/24 0953) Pulse Rate:  [88-100] 92 (09/24 0953) Resp:  [16-20] 17 (09/24 0953) BP: (124-144)/(80-88) 124/88 (09/24 0953) SpO2:  [99 %-100 %] 100 % (09/24 0953)  Hemodynamic parameters for last 24 hours:    Intake/Output from previous day: 09/23 0701 - 09/24 0700 In: 997.4 [P.O.:800; I.V.:193.3; IV Piggyback:4.1] Out: 1500 [Urine:1500] Intake/Output this shift: Total I/O In: 222 [P.O.:222] Out: -   Physical Exam: Patient resting comfortably in bed  Wound VAC functioning properly  lab Results: Recent Labs    08/23/19 0623  WBC 5.6  HGB 9.3*  HCT 28.2*  PLT 399   BMET:  Recent Labs    08/23/19 0623  NA 141  K 3.9  CL 103  CO2 26  GLUCOSE 125*  BUN 13  CREATININE 0.75  CALCIUM 8.6*    PT/INR: No results for input(s): LABPROT, INR in the last 72 hours. ABG    Component Value Date/Time   PHART 7.482 (H) 08/01/2019 0909   HCO3 31.0 (H) 08/01/2019 0909   TCO2 32 08/01/2019 0909   O2SAT 97.0 08/01/2019 0909   CBG (last 3)  Recent Labs    08/22/19 1702 08/22/19 2112 08/23/19 0644  GLUCAP 135* 193* 142*    EXAM: CHEST - 2 VIEW  COMPARISON:  Seven days ago  FINDINGS: Left chest tube has been removed. A small left pleural effusion is present with slight increase. No visible pneumothorax. Normal heart size and mediastinal contours.  IMPRESSION: Small left pleural effusion with minimal increase from 7 days ago.   Electronically Signed   By: Monte Fantasia M.D.   On: 08/21/2019 09:42   Assessment/Plan: S/P Procedure(s) (LRB): WOUND DEBRIDEMENT (N/A) WOUND VAC CHANGE (N/A)  -POD-21incision and drainage of the left [place] joint abscess and subsequent wound vac placement and POD10excisional  debridement and vac change in the OR.  Plan wound VAC change tomorrow ABX per ID.We will continue to follow.  LOS: 22 days    Grace Isaac, [place] [phone] 08/23/2019

## 2019-08-23 NOTE — Progress Notes (Signed)
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  imaging= right heel abscess, left upper chest wall abscess and mass, CT scan noted left anterior chest wall soft tissue and intramuscular infection with extension into the thoracic cavity, air in the soft tissues of the right axilla adjacent to the right shoulder, multiple small pulmonary nodules.  Patient was admitted to ICU. Underwent surgical debridement of the chest wall abscess and wound VAC placement.  Also found to have new diagnosis of diabetes. MRI showed multiple epidural and paraspinal abscesses. Blood cultures 9/2: MSSA Blood cultures 9/3: no growth so far. Wound culture 9/2: MSSA. Right heel culture 9/3: Abundant WBC. MSSA. Repeat c/s/ : 9/3,9/6 negative so far.  Clinically improving.   Subjective:  Has LBP x 2 days and spasm Tells me felt very weak this am on awakening  Assessment/Plan: Sepsis present on admission with MSSA bacteremia due to IV drug use: Septic pulmonary emboli, Left chest wall abscess, Right heel abscess, Extensive right posterolateral epidural abscess L2-L3, Osteomyelitis of the L5-S1, Posterior epidural abscess L5-S1 S2. - 9/11, patient underwent incision and drainage and debridement left chest wall on wound VAC, followed by CT CS. -Bedside I&D right heel, MSSA. -Multiple epidural abscess and discitis, seen byNS--onjly Abx, no Surg--Adding flexeril 5 mg 3 times daily and is doing well I have encouraged patient to mobilize ambulate with nursing mobility over the next 24 hours in the room and may be out of bed and ambulate in the hallway - followed by infectious disease. He was on cefazolin and metronidazole. Now remains on IV nafcillin.  Repeat cultures were negative. - Intraoperative TEE with no evidence of endocarditis. - Anticipate intravenous antibiotics for 8 weeks , 10/28 as per ID recommendation for total 8 weeks starting from 07/31/2019  Right shoulder septic bursitis 9/10, patient complained of stiffness of right shoulder.  CT scan showed septic bursitis without evidence of septic arthritis or osteomyelitis.  Orthopedic consult appreciated.  Recommended to continue IV antibiotics.  Also underwent arthrocentesis on 9/14.  Fluid analysis showed high WBC count 87,000 without bacteria.  Continue IV antibiotics.  IV drug JAS:NKNLZJQ opiate use. Patient is motivated to quit. Continue provide symptomatic treatment. Opiates for pain relief for postoperative pain. Continue Tylenol and muscle relaxants. Patient is using some oxycodone with postop pain, will gradually taper it off after wound VAC removal.  Will contact internal medicine teaching service for de-escalation off of opioid use closer to time of discharge  New onset diabetes:A1c 7.8. Started on insulin. CBG 100-215, on reg dist Currently on Levemir to 20 units and prandial insulin. CBGs ranging from 90s to 140s  Hypokalemia-persistently low.  Keep on a schedule replacement. Random urine potassium level high, on  Potassium supplement. No hypertension suggesting aldosteronism.   Normocytic normochromic anemia-stable at this time  Insomnia -improving with trazodone at bedtime.  Mobility: Encourage ambulation Diet: Diabetic diet DVT prophylaxis: Lovenox Code Status: Full Code  Family Communication:long discussion with mother at the bedsdie Expected Discharge: Continue inpatient management  Consultants:  PCCM, transfer out  CT CS, following  Neurosurgery, signed off  Orthopedics, signed off  ID  Procedures:  Chest wall abscess incision and drainage, 08/02/2019  Right heel incision and drainage, 08/03/2019  Antimicrobials:    Vancomycin, 08/01/2019>>> 08/02/2019  Cefepime, 08/01/2019>>> 08/02/2019  Cefazolin, 08/02/2019>>>>> 08/06/2019  Flagyl, 08/02/2019>>> 08/06/2019  Diflucan, on 08/03/2019 one dose  Nystatin swish and swallow, 08/03/2019 >>>  Nafcillin 08/06/2019>>>>  Infusions:  . sodium chloride 10 mL/hr at 08/23/19 0400  . lactated ringers 10 mL/hr at 08/10/19 0845  . nafcillin IV 2 g (08/23/19 0930)    Scheduled Meds: . Chlorhexidine Gluconate Cloth  6 each Topical Daily  . cyclobenzaprine  7.5 mg Oral TID  . docusate sodium  100 mg Oral Daily  . enoxaparin (LOVENOX) injection  40 mg Subcutaneous Q24H  . insulin aspart  0-15 Units Subcutaneous TID WC  . insulin aspart  0-5 Units Subcutaneous QHS  . insulin aspart  5 Units Subcutaneous TID WC  . insulin glargine  20 Units Subcutaneous Daily  . magnesium oxide  400 mg Oral BID  . Melatonin  6 mg Oral Once  . metoprolol tartrate  12.5 mg Oral BID  . nystatin  5 mL Oral QID  . ondansetron  4 mg Intravenous Once  . potassium chloride  10 mEq Oral TID  . Ensure Max Protein  11 oz Oral BID  . sodium chloride flush  10-40 mL Intracatheter Q12H    PRN meds: sodium chloride, acetaminophen, calcium carbonate, ondansetron (ZOFRAN) IV, oxyCODONE, promethazine, sodium chloride flush, traZODone   Objective: Vitals:   08/22/19 1922 08/23/19 0953  BP: (!) 144/87 124/88  Pulse: 100 92  Resp: 16 17  Temp: 99 F (37.2 C) 98.1 F (36.7 C)  SpO2: 99% 100%    Intake/Output Summary (Last 24 hours) at 08/23/2019 1053 Last data filed at 08/23/2019 0900 Gross per 24 hour  Intake 1219.4 ml  Output 600 ml  Net 619.4 ml   Filed Weights   08/10/19 0433 08/11/19 0401 08/15/19 0500  Weight: 78.6 kg 80.6 kg 77.1 kg   Weight change:  Body mass index is 21.82 kg/m.   Physical Exam:  Awake coherent smiling does not seem to be in pain EOMI NCAT CTA B no added sound Abdomen soft no rebound no guarding Lower extremities are softer and he is able to mobilize a little  better No focal deficit Smile symmetric   Data Review: I have personally reviewed the laboratory data and studies available.  Recent Labs  Lab 08/18/19 0655 08/23/19 0623  WBC 5.3 5.6  NEUTROABS 4.0 4.0  HGB 9.1* 9.3*  HCT 27.7* 28.2*  MCV 95.2 95.6  PLT 396 399   Recent Labs  Lab 08/18/19 0655 08/23/19 0623  NA 139 141  K 3.2* 3.9  CL 100 103  CO2 30 26  GLUCOSE 101* 125*  BUN 9 13  CREATININE 0.68 0.75  CALCIUM 8.0* 8.6*  MG 2.0 1.9  PHOS 3.9  --     Total time spent: 35 minutes for direct patient care.  Rhetta Mura, MD  [facility] 08/23/2019

## 2019-08-23 NOTE — Progress Notes (Signed)
Pt ambulated in hallway with RN stand by assist; x2 ambulation approximate 600 ft with walker. Pt verbalize feeling much better whiles ambulating. Pt denies any dizziness or discomfort. Will continue to closely monitor. Delia Heady RN

## 2019-08-24 LAB — GLUCOSE, CAPILLARY
Glucose-Capillary: 109 mg/dL — ABNORMAL HIGH (ref 70–99)
Glucose-Capillary: 214 mg/dL — ABNORMAL HIGH (ref 70–99)

## 2019-08-24 MED ORDER — IBUPROFEN 100 MG PO CHEW
800.0000 mg | CHEWABLE_TABLET | Freq: Four times a day (QID) | ORAL | Status: DC | PRN
  Administered 2019-08-24 – 2019-08-27 (×6): 800 mg via ORAL
  Administered 2019-08-27 – 2019-08-28 (×2): 400 mg via ORAL
  Filled 2019-08-24 (×10): qty 8

## 2019-08-24 MED ORDER — CYCLOBENZAPRINE HCL 10 MG PO TABS
5.0000 mg | ORAL_TABLET | Freq: Three times a day (TID) | ORAL | Status: DC | PRN
  Administered 2019-08-24 – 2019-10-02 (×39): 5 mg via ORAL
  Filled 2019-08-24 (×42): qty 1

## 2019-08-24 MED ORDER — CYCLOBENZAPRINE HCL 5 MG PO TABS
7.5000 mg | ORAL_TABLET | Freq: Three times a day (TID) | ORAL | Status: DC | PRN
  Administered 2019-08-24: 5 mg via ORAL
  Filled 2019-08-24 (×2): qty 1.5

## 2019-08-24 NOTE — Progress Notes (Signed)
Per rehab leadership, ultrasound/e-stim modalities not available in this department. Will continue to treat back pain/probably SIJ dysfunction with therapeutic exercise and activity as appropriate.  Deniece Ree PT, DPT, CBIS  Supplemental Physical Therapist [facility]    Pager [phone] Acute Rehab Office [phone]

## 2019-08-24 NOTE — Progress Notes (Signed)
Physical Therapy Treatment Patient Details Name: William [name] MRN: [id number] DOB: [birth year] Today's Date: 08/24/2019    History of Present Illness Patient is a 38 y/o male who presents with lower back pain, left shoulder pain, right foot and heel pain. Found to have right heel abscess s/p I&D 9/4, left upper chest wall abscess and mass s/p I&D and wound vac application 9/3, new onset DM, sepsis with MSSA bacteremia due to IV drug use. CT scan- left anterior chest wall soft tissue and intramuscular infection with extension into the thoracic cavity, air in the soft tissues of the right axilla adjacent to the right shoulder, multiple small pulmonary nodules. PMH- heroin IV drug use.    PT Comments    Patient received in bed, pleasant and willing to work with therapy and excited about having had chest wound vac removed earlier this morning. Able to perform bed mobility with min guard and HOB elevated, significant amount of extended time to perform. Insistent on using cane for sit to stand but did in the end need RW to power up to full standing with min guard and extended time. Progressing well otherwise and able to perform gait training approximately 817f today but ultimately limited by pain, able to then perform back to bed with min guard and extended time. Continues to experience severe SIJ and musculoskeletal pain with mobility likely due to prolonged immobilization during this hospital stay, MD (Dr. SVerlon [name] gives verbal permission for use of US/modalities as well as exercises and mobility in bed to address pain. Note pain easily aggravated/irritable and takes a long time to calm back down, so remained conservative and prescribed supine lower trunk rotations due to high efficiency at reducing pain and irritability of back today. Will progress as tolerated and appropriate. Education provided about increasing mobility when in bed (rolling side to side, changing angle of head of bed), getting up in  chair, and walking as much as tolerated/possible with nursing and PT to promote further mobility and assist in reducing musculoskeletal discomfort. He was left in bed with all needs met, nursing staff present and attending, and family also present.     Follow Up Recommendations  Home health PT;Supervision for mobility/OOB     Equipment Recommendations  Rolling walker with 5" wheels    Recommendations for Other Services       Precautions / Restrictions Precautions Precautions: Fall Precaution Comments: weakness, chest wound vac pulled 08/24/19, probably SI joint dysfunction Restrictions Weight Bearing Restrictions: No    Mobility  Bed Mobility Overal bed mobility: Needs Assistance Bed Mobility: Supine to Sit;Sit to Supine     Supine to sit: Min guard;HOB elevated Sit to supine: Min guard;HOB elevated   General bed mobility comments: min guard for all bed mobility, extended time due to pain  Transfers Overall transfer level: Needs assistance Equipment used: Rolling walker (2 wheeled) Transfers: Sit to/from Stand Sit to Stand: From elevated surface Stand pivot transfers: Min guard       General transfer comment: insistent on attempting cane on R to power up, unable to fully come up to standing and needed RW especially due to back pain/spasm  Ambulation/Gait Ambulation/Gait assistance: Supervision Gait Distance (Feet): 800 Feet Assistive device: Rolling walker (2 wheeled) Gait Pattern/deviations: Step-through pattern;Decreased stride length Gait velocity: decreased   General Gait Details: guarded but steady gait   Stairs             Wheelchair Mobility    Modified Rankin (Stroke Patients Only)  Balance Overall balance assessment: Needs assistance Sitting-balance support: Feet supported Sitting balance-Leahy Scale: Good Sitting balance - Comments: Requires BUE support as position of comfort.   Standing balance support: Bilateral upper extremity  supported;During functional activity Standing balance-Leahy Scale: Poor Standing balance comment: able to maintain static stance without UE support, close supervision for safety                            Cognition Arousal/Alertness: Awake/alert Behavior During Therapy: WFL for tasks assessed/performed Overall Cognitive Status: Within Functional Limits for tasks assessed Area of Impairment: Attention;Following commands;Problem solving;Memory;Orientation;Safety/judgement;Awareness                 Orientation Level: Person;Place;Time;Situation Current Attention Level: Alternating   Following Commands: Follows one step commands consistently;Follows multi-step commands consistently;Follows multi-step commands with increased time Safety/Judgement: Decreased awareness of safety Awareness: Anticipatory Problem Solving: Decreased initiation;Requires verbal cues        Exercises      General Comments        Pertinent Vitals/Pain Pain Assessment: Faces Faces Pain Scale: Hurts whole lot Pain Location: back and R SIJ with movement Pain Descriptors / Indicators: Grimacing;Guarding;Discomfort;Sore Pain Intervention(s): Limited activity within patient's tolerance;Monitored during session;Utilized relaxation techniques    Home Living                      Prior Function            PT Goals (current goals can now be found in the care plan section) Acute Rehab PT Goals Patient Stated Goal: to get home PT Goal Formulation: With patient Time For Goal Achievement: 08/27/19 Potential to Achieve Goals: Good Progress towards PT goals: Progressing toward goals    Frequency    Min 3X/week      PT Plan Current plan remains appropriate    Co-evaluation              AM-PAC PT "6 Clicks" Mobility   Outcome Measure  Help needed turning from your back to your side while in a flat bed without using bedrails?: None Help needed moving from lying on your  back to sitting on the side of a flat bed without using bedrails?: A Little Help needed moving to and from a bed to a chair (including a wheelchair)?: A Little Help needed standing up from a chair using your arms (e.g., wheelchair or bedside chair)?: A Little Help needed to walk in hospital room?: A Little Help needed climbing 3-5 steps with a railing? : A Little 6 Click Score: 19    End of Session   Activity Tolerance: Patient tolerated treatment well Patient left: in bed;with call bell/phone within reach;with family/visitor present;with bed alarm set;with nursing/sitter in room   PT Visit Diagnosis: Muscle weakness (generalized) (M62.81);Pain;Difficulty in walking, not elsewhere classified (R26.2) Pain - Right/Left: Left Pain - part of body: (back)     Time: 5449-2010 PT Time Calculation (min) (ACUTE ONLY): 45 min  Charges:  $Gait Training: 8-22 mins $Therapeutic Exercise: 8-22 mins $Self Care/Home Management: 8-22                     Deniece Ree PT, DPT, CBIS  Supplemental Physical Therapist [place]    Pager [phone] Acute Rehab Office [phone]

## 2019-08-24 NOTE — Consult Note (Signed)
[place] Nurse wound follow up PA at bedside.  Dressing is removed and NPWT (VAC) is discontinued .  Will implement NS gauze moist dressings daily.  [place] team will no longer follow.  Wound type:infectious  Measurement: 1.5 cm x 5 cm x 0.3 cm  Wound JQG:BEEFE red and moist Drainage (amount, consistency, odor) minimal serosanguinous  No odor Periwound: intact Dressing procedure/placement/frequency: Cleanse wound with NS and pat dry.  Apply NS Moist 2X2 and cover with dry gauze/tape.  Change daily.  Will not follow at this time.  Please re-consult if needed.  Domenic Moras MSN, RN, FNP-BC CWON Wound, Ostomy, Continence Nurse Pager [phone]

## 2019-08-24 NOTE — Plan of Care (Signed)
  Problem: Nutrition: Goal: Adequate nutrition will be maintained Outcome: Progressing   

## 2019-08-24 NOTE — Progress Notes (Signed)
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  imaging= right heel abscess, left upper chest wall abscess and mass, CT scan noted left anterior chest wall soft tissue and intramuscular infection with extension into the thoracic cavity, air in the soft tissues of the right axilla adjacent to the right shoulder, multiple small pulmonary nodules.  Patient was admitted to ICU. Underwent surgical debridement of the chest wall abscess and wound VAC placement.  Also found to have new diagnosis of diabetes. MRI showed multiple epidural and paraspinal abscesses. Blood cultures 9/2: MSSABlood cultures 9/3: no growth so far.Wound culture 9/2: MSSA. Right heel culture 9/3: Abundant WBC. MSSA.Repeat c/s/ : 9/3,9/6 negative so far.    Subjective: Doing well, no distress, ambulating gingerly in the hallway with therapist refusing to use Lovenox, nystatin and wants a lower dose of Flexeril Has pain in pelvis  Assessment/Plan: Sepsis present on admission with MSSA bacteremia due to IV drug use: Septic pulmonary emboli, Left chest wall abscess, Right heel abscess, Extensive right posterolateral epidural abscess L2-L3, Osteomyelitis of the L5-S1, Posterior epidural abscess L5-S1 S2. - 9/11, patient underwent incision and drainage and debridement left chest wall on wound VAC, followed by CT CS. -Bedside I&D right heel, MSSA. -Multiple epidural abscess and discitis, seen byNS--only Abx, no Surg--Adding flexeril 5 mg 3 times daily and is doing well--increase ibuprofen dose  patient to mobilize ambulate with nursing mobility prn - followed by infectious disease.  cefazolin and metronidazole-->IV nafcillin. Repeat cultures were negative. - Intraoperative TEE  with no evidence of endocarditis. - Anticipate intravenous antibiotics for 8 weeks , 10/28 as per ID recommendation for total 8 weeks starting from 07/31/2019   Pelvic dysfunction-patient has contralateral pain with SLR on the left in the right hip-physical therapist to work with him If no improvement would get MRI of the back to ensure no swelling or spread of above abscesses  Right shoulder septic bursitis 9/10, patient complained of stiffness of right shoulder.  CT scan showed septic bursitis without evidence of septic arthritis or osteomyelitis.  Orthopedic consult appreciated.  Recommended to continue IV antibiotics.  Also underwent arthrocentesis on 9/14.  Fluid analysis showed high WBC count 87,000 without bacteria.  Continue IV antibiotics.  IV drug GHW:[id number] opiate use. Patient is motivated to quit. Continue provide symptomatic treatment. Opiates for pain relief for postoperative pain. Continue Tylenol and muscle relaxants. Taper off oxycodone once wound VAC discontinued in the next several days it looks like this has been done Will contact internal medicine teaching service for de-escalation off of opioid use closer to time of discharge  New onset diabetes:A1c 7.8. Started on insulin-Currently on Levemir to 20 units and prandial insulin. CBGs ranging 109 -215 no changes today  Hypokalemia-persistently low.  Keep on a schedule replacement. Random urine potassium level high, on  Potassium supplement. No hypertension suggesting aldosteronism.   Normocytic normochromic anemia-stable at this time  Insomnia -improving with trazodone at bedtime--no changes today  Mobility: Encourage ambulation Diet: Diabetic diet DVT prophylaxis: Lovenox Code Status: Full Code  Family Communication:Discussed multiple days with mother at the bedside Expected Discharge: Continue inpatient management  Consultants:  PCCM, transfer out  CT CS, following  Neurosurgery, signed off   Orthopedics, signed off  ID  Procedures:  Chest wall abscess incision and drainage, 08/02/2019  Right heel incision and drainage, 08/03/2019  Antimicrobials:   Vancomycin, 08/01/2019>>> 08/02/2019  Cefepime, 08/01/2019>>> 08/02/2019  Cefazolin, 08/02/2019>>>>> 08/06/2019  Flagyl, 08/02/2019>>> 08/06/2019  Diflucan, on 08/03/2019 one dose  Nystatin swish and swallow, 08/03/2019 >>>  Nafcillin 08/06/2019>>>>  Infusions:  . sodium chloride Stopped (08/24/19 0144)  . lactated ringers 10 mL/hr at 08/10/19 0845  . nafcillin IV 2 g (08/24/19 1027)    Scheduled Meds: . Chlorhexidine Gluconate Cloth  6 each Topical Daily  . docusate sodium  100 mg Oral Daily  . insulin aspart  0-15 Units Subcutaneous TID WC  . insulin aspart  0-5 Units Subcutaneous QHS  . insulin aspart  5 Units Subcutaneous TID WC  . insulin glargine  20 Units Subcutaneous Daily  . magnesium oxide  400 mg Oral BID  . Melatonin  6 mg Oral Once  . metoprolol tartrate  12.5 mg Oral BID  . ondansetron  4 mg Intravenous Once  . potassium chloride  10 mEq Oral TID  . Ensure Max Protein  11 oz Oral BID  . sodium chloride flush  10-40 mL Intracatheter Q12H    PRN meds: sodium chloride, acetaminophen, calcium carbonate, cyclobenzaprine, ibuprofen, ondansetron (ZOFRAN) IV, oxyCODONE, promethazine, sodium chloride flush, traZODone   Objective: Vitals:   08/23/19 2000 08/24/19 0915  BP: (!) 132/94 126/89  Pulse: 95 (!) 118  Resp: 18 17  Temp:  97.6 F (36.4 C)  SpO2: 100% 100%    Intake/Output Summary (Last 24 hours) at 08/24/2019 1146 Last data filed at 08/24/2019 0615 Gross per 24 hour  Intake 1661.11 ml  Output 2450 ml  Net -788.89 ml   Filed Weights   08/10/19 0433 08/11/19 0401 08/15/19 0500  Weight: 78.6 kg 80.6 kg 77.1 kg   Weight change:  Body mass index is 21.82 kg/m.   Physical Exam:  Coherent awake smiling ambulating around the unit No chest pain no fever EOMI NCAT no focal deficit No new issue  Abdomen soft nontender nondistended no rebound no guarding Straight leg raise on left side causes pain in the right back as well as internal and external rotation at hip causes this pain some spasm noted Power 5/5 But limited by effort  Data Review: I have personally reviewed the laboratory data and studies available.  Recent Labs  Lab 08/18/19 0655 08/23/19 0623  WBC 5.3 5.6  NEUTROABS 4.0 4.0  HGB 9.1* 9.3*  HCT 27.7* 28.2*  MCV 95.2 95.6  PLT 396 399   Recent Labs  Lab 08/18/19 0655 08/23/19 0623  NA 139 141  K 3.2* 3.9  CL 100 103  CO2 30 26  GLUCOSE 101* 125*  BUN 9 13  CREATININE 0.68 0.75  CALCIUM 8.0* 8.6*  MG 2.0 1.9  PHOS 3.9  --    Total time spent: 25 minutes for direct patient care.  Nita Sells, MD  [facility] 08/24/2019

## 2019-08-24 NOTE — Progress Notes (Addendum)
14 Days Post-Op Procedure(s) (LRB): WOUND DEBRIDEMENT (N/A) WOUND VAC CHANGE (N/A) Subjective: Resting in bed. Making progress with mobility but still complaining of back spasm.  No new concerns related to the left chest wound.   Objective: Vital signs in last 24 hours: Temp:  [97.6 F (36.4 C)-98.7 F (37.1 C)] 97.6 F (36.4 C) (09/25 0915) Pulse Rate:  [88-118] 118 (09/25 0915) Resp:  [17-18] 17 (09/25 0915) BP: (126-132)/(86-94) 126/89 (09/25 0915) SpO2:  [100 %] 100 % (09/25 0915)  Hemodynamic parameters for last 24 hours:    Intake/Output from previous day: 09/24 0701 - 09/25 0700 In: 1883.1 [P.O.:462; I.V.:163.1; IV Piggyback:1258] Out: 2450 [Urine:2400; Drains:50] Intake/Output this shift: No intake/output data recorded.   Physical Exam: General appearance:alert, cooperative andnodistress Wound:The wound vac dressing was removed. The woundisdry and granulating well on all surfaces and is contracting. It is now only a few millimeters in depth and approximately 4cm wide and 5cm long.   Lab Results: Recent Labs    08/23/19 0623  WBC 5.6  HGB 9.3*  HCT 28.2*  PLT 399   BMET:  Recent Labs    08/23/19 0623  NA 141  K 3.9  CL 103  CO2 26  GLUCOSE 125*  BUN 13  CREATININE 0.75  CALCIUM 8.6*    PT/INR: No results for input(s): LABPROT, INR in the last 72 hours. ABG    Component Value Date/Time   PHART 7.482 (H) 08/01/2019 0909   HCO3 31.0 (H) 08/01/2019 0909   TCO2 32 08/01/2019 0909   O2SAT 97.0 08/01/2019 0909   CBG (last 3)  Recent Labs    08/23/19 1733 08/23/19 2132 08/24/19 0625  GLUCAP 114* 133* 109*    Assessment/Plan: S/P Procedure(s) (LRB): WOUND DEBRIDEMENT (N/A) WOUND VAC CHANGE (N/A)  -POD-22incision and drainage of the left [place] joint abscess and subsequent wound vac placement and POD15excisional debridement and vac change in the OR.  Wound examined during vac changetodayand appearsclean andis continuing to granulate  nicely.There is no drainage. The granulation tissue has fiilled in from the floor of the wound to the level of the skin edges. Will discontinue the vac therapy and begin saline moist to dry dressings daily.     LOS: 23 days    Antony Odea, PA-C [phone] 08/24/2019  I have seen and examined William [name] and agree with the above assessment  and plan.  Grace Isaac MD Beeper [phone] Office [phone] 08/24/2019 12:39 PM

## 2019-08-25 LAB — GLUCOSE, CAPILLARY
Glucose-Capillary: 88 mg/dL (ref 70–99)
Glucose-Capillary: 141 mg/dL — ABNORMAL HIGH (ref 70–99)
Glucose-Capillary: 245 mg/dL — ABNORMAL HIGH (ref 70–99)

## 2019-08-25 NOTE — Progress Notes (Signed)
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  imaging= right heel abscess, left upper chest wall abscess and mass, CT scan noted left anterior chest wall soft tissue and intramuscular infection with extension into the thoracic cavity, air in the soft tissues of the right axilla adjacent to the right shoulder, multiple small pulmonary nodules.  Patient was admitted to ICU. Underwent surgical debridement of the chest wall abscess and wound VAC placement--VAC removed 9/25 Also found to have new diagnosis of diabetes. MRI showed multiple epidural and paraspinal abscesses. Blood cultures 9/2: MSSABlood cultures 9/3: no growth so far.Wound culture 9/2: MSSA. Right heel culture 9/3: Abundant WBC. MSSA.Repeat c/s/ : 9/3,9/6 negative so far.    Subjective:  Well  Mobility improved to LE's and less pain  No fever no chills no n/v/coug/cold Not using robaxin and ibu as not needed  Assessment/Plan: Sepsis present on admission with MSSA bacteremia due to IV drug use: Septic pulmonary emboli, Left chest wall abscess, Right heel abscess, Extensive right posterolateral epidural abscess L2-L3, Osteomyelitis of the L5-S1, Posterior epidural abscess L5-S1 S2. - 9/11, patient underwent incision and drainage and debridement left chest wall on wound VAC, followed by CT CS. -Bedside I&D right heel, MSSA. -Multiple epidural abscess and discitis, seen byNS--only Abx, no Surg - followed by infectious disease.  cefazolin and metronidazole-->IV nafcillin. Repeat cultures were negative. - Intraoperative TEE with no evidence of endocarditis. - Anticipate intravenous antibiotics for 8 weeks , 10/28 as per ID recommendation for total 8 weeks starting from 07/31/2019    Pelvic dysfunction-patient has contralateral pain with SLR on the left in the right hip-physical therapist to work with him Improved-prn ibuprofen/flexeril  Right shoulder septic bursitis 9/10, patient complained of stiffness of right shoulder.  CT scan showed septic bursitis without evidence of septic arthritis or osteomyelitis.  Orthopedic consult appreciated.  Recommended to continue IV antibiotics.  Also underwent arthrocentesis on 9/14.  Fluid analysis showed high WBC count 87,000 without bacteria.  Continue IV antibiotics.  IV drug UXL:[id number] opiate use. Patient is motivated to quit. Continue provide symptomatic treatment. Opiates for pain relief for postoperative pain. Continue Tylenol and muscle relaxants. Taper off oxycodone once wound VAC discontinued in the next several days it looks like this has been done Will contact internal medicine teaching service for de-escalation off of opioid use closer to time of discharge  New onset diabetes:A1c 7.8. Started on insulin-Currently on Levemir to 20 units and prandial insulin. CBGs ranging 109 -215 no changes today  Hypokalemia-persistently low.  Keep on a schedule replacement. Random urine potassium level high, on  Potassium supplement. No hypertension suggesting aldosteronism.   Normocytic normochromic anemia-stable at this time  Insomnia -improving with trazodone at bedtime--no changes today  Mobility: Encourage ambulation Diet: Diabetic diet DVT prophylaxis: Lovenox Code Status: Full Code  Family Communication:Discussed multiple days with mother at the bedside Expected Discharge: Continue inpatient management  Consultants:  PCCM, transfer out  CT CS, following  Neurosurgery, signed off  Orthopedics, signed off  ID  Procedures:  Chest wall abscess incision and drainage, 08/02/2019  Right heel incision and drainage, 08/03/2019  Antimicrobials:   Vancomycin, 08/01/2019>>> 08/02/2019  Cefepime,  08/01/2019>>> 08/02/2019  Cefazolin, 08/02/2019>>>>> 08/06/2019  Flagyl, 08/02/2019>>> 08/06/2019  Diflucan,  on 08/03/2019 one dose  Nystatin swish and swallow, 08/03/2019 >>>  Nafcillin 08/06/2019>>>>  Infusions:  . sodium chloride 1,000 mL (08/24/19 2247)  . lactated ringers 10 mL/hr at 08/10/19 0845  . nafcillin IV 2 g (08/25/19 0956)    Scheduled Meds: . Chlorhexidine Gluconate Cloth  6 each Topical Daily  . docusate sodium  100 mg Oral Daily  . insulin aspart  0-15 Units Subcutaneous TID WC  . insulin aspart  0-5 Units Subcutaneous QHS  . insulin aspart  5 Units Subcutaneous TID WC  . insulin glargine  20 Units Subcutaneous Daily  . magnesium oxide  400 mg Oral BID  . Melatonin  6 mg Oral Once  . metoprolol tartrate  12.5 mg Oral BID  . ondansetron  4 mg Intravenous Once  . potassium chloride  10 mEq Oral TID  . Ensure Max Protein  11 oz Oral BID  . sodium chloride flush  10-40 mL Intracatheter Q12H    PRN meds: sodium chloride, acetaminophen, calcium carbonate, cyclobenzaprine, ibuprofen, ondansetron (ZOFRAN) IV, oxyCODONE, promethazine, sodium chloride flush, traZODone   Objective: Vitals:   08/25/19 0732 08/25/19 1207  BP: 128/88 130/89  Pulse: (!) 102 84  Resp: 16 16  Temp: 97.8 F (36.6 C) 98.5 F (36.9 C)  SpO2: 100% 99%    Intake/Output Summary (Last 24 hours) at 08/25/2019 1307 Last data filed at 08/25/2019 0545 Gross per 24 hour  Intake 532.35 ml  Output 1525 ml  Net -992.65 ml   Filed Weights   08/10/19 0433 08/11/19 0401 08/15/19 0500  Weight: 78.6 kg 80.6 kg 77.1 kg   Weight change:  Body mass index is 21.82 kg/m.   Physical Exam:   smiling ambulating around the unit No chest pain no fever EOMI NCAT no focal deficit No new issue Abdomen soft nontender nondistended no rebound no guarding Straight leg raise imoproved on both sides with increased ROM to hips bilaterally Power 5/5 But limited by effort  Data Review: I have personally reviewed the  laboratory data and studies available.  Recent Labs  Lab 08/23/19 0623  WBC 5.6  NEUTROABS 4.0  HGB 9.3*  HCT 28.2*  MCV 95.6  PLT 399   Recent Labs  Lab 08/23/19 0623  NA 141  K 3.9  CL 103  CO2 26  GLUCOSE 125*  BUN 13  CREATININE 0.75  CALCIUM 8.6*  MG 1.9   Total time spent: 25 minutes for direct patient care.  Nita Sells, MD  [facility] 08/25/2019

## 2019-08-26 LAB — GLUCOSE, CAPILLARY
Glucose-Capillary: 185 mg/dL — ABNORMAL HIGH (ref 70–99)
Glucose-Capillary: 74 mg/dL (ref 70–99)
Glucose-Capillary: 204 mg/dL — ABNORMAL HIGH (ref 70–99)
Glucose-Capillary: 102 mg/dL — ABNORMAL HIGH (ref 70–99)
Glucose-Capillary: 156 mg/dL — ABNORMAL HIGH (ref 70–99)

## 2019-08-26 NOTE — Progress Notes (Signed)
      [phone]       [place] [id number]             [phone]    No fevers, VSS  Wound conts to heal well, conts daily wet to dry dressing changes  John Giovanni, PA-C

## 2019-08-26 NOTE — Progress Notes (Signed)
PROGRESS NOTE    William [name]  ZOX:[id number] DOB: 1981-02-04 DOA: 07/31/2019 PCP: Patient, No Pcp Per   Brief Narrative:  Patient is a 38 year old male with history of IV drug abuse who was initially admitted with multiple complaints including lower back pain, left shoulder pain, right foot/heel pain.  Imaging done on presentation showed  right heel abscess, left upper chest wall abscess and mass.  CT scan showed left anterior chest wall soft tissue and intramuscular infection with extension into thoracic cavity, air in the soft tissue of the right axillary region to the right shoulder, multiple long nodules.  Patient was initially admitted to ICU.  She underwent surgical debridement of the chest wall abscess and wound VAC placement which was removed on 9/25.  He was also found to have new onset diabetes here.  MRI had shown multiple epidural and paraspinal abscesses.  Blood cultures/wound cultures showed MSSA.  ID was initially following.  Started on nafcillin via midline.  Last day of antibiotics is 10/28 as per ID who recommended 8 weeks of antibiotics starting from 07/31/2019.  Anticipate inpatient hospitalization until he completes antibiotic therapy.  Assessment & Plan:   Principal Problem:   Staphylococcus aureus bacteremia Active Problems:   Sepsis (HCC)   Abscess of paraspinous muscles   Osteomyelitis of lumbar spine (HCC)   Epidural abscess of spine due to infective embolism   Abnormal chest CT   Septic embolism (HCC)   Community acquired pneumonia   IV drug abuse (HCC)   Heroin abuse (HCC)   Elevated troponin   UTI (urinary tract infection)   Renal failure   Hyperglycemia   Suspected endocarditis   Chest wall abscess   Abscess of right heel   Acute osteomyelitis of lumbar spine (HCC)   Sepsis secondary to MSSA bacteremia secondary to drug abuse: History of IV drug abuse.  Injects heroin.  Presented with multiple abscesses as above.  Underwent incision and drainage and  debridement of left chest wall with wound VAC and was being followed by CT CS.  Status post bedside I&D right heel.  Wound culture, blood cultures showing MSSA.  Also had multiple epidural abscess/discitis was seen by neurosurgery, no recommendation for surgery.  Intraoperative TEE did not show any evidence of endocarditis.   Currently on nafcillin which will be continued through 10/28.  Abdominal discomfort/generalized body ache: Improved.  Continue muscle relaxants/NSAIDs.  Right shoulder septic bursitis: Complain of stiffness of the right shoulder earlier.  CT showed septic bursitis without evidence of  Osteomyelitis/septic arthitis.  Orthopedics was following.  Status post arthrocentesis which were suggestive of septic bursitis.  Plan is to continue IV antibiotics.  New onset diabetes mellitus: Hemoglobin A1c of 7.8.  Continue current regimen.  Anti-hyper glycemic can be changed to oral on discharge.  Hypokalemia: Currently stable  Normocytic normochromic anemia: Currently H&H is stable  IV drug abuse: Counseled for cessation.  Patient is motivated to quit.  We will consider communicating with IM teaching service  when he is closer to the discharge so that he can follow-up as an outpatient.  Service/addiction clinic  Nutrition Problem: Increased nutrient needs Etiology: wound healing      DVT prophylaxis:Lovenox Code Status: Full Family Communication: None present at the bedside Disposition Plan: Home after completion of antibiotic therapy   Consultants: PCCM, see TCS, neurosurgery, ID  Procedures: Chest wall abscess incision/drainage right heel incision/drainage  Antimicrobials:  Anti-infectives (From admission, onward)   Start     Dose/Rate Route Frequency Ordered  Stop   08/10/19 0936  vancomycin (VANCOCIN) 1,000 mg in sodium chloride 0.9 % 1,000 mL irrigation  Status:  Discontinued       As needed 08/10/19 0937 08/10/19 1121   08/10/19 0600  ceFAZolin (ANCEF) IVPB 2g/100  mL premix     2 g 200 mL/hr over 30 Minutes Intravenous To Short Stay 08/10/19 0036 08/10/19 0625   08/09/19 1730  ceFAZolin (ANCEF) IVPB 2g/100 mL premix  Status:  Discontinued     2 g 200 mL/hr over 30 Minutes Intravenous 30 min pre-op 08/09/19 1730 08/10/19 0036   08/06/19 1430  nafcillin injection 2 g  Status:  Discontinued     2 g Intravenous Every 4 hours 08/06/19 1310 08/06/19 1359   08/06/19 1430  nafcillin 2 g in sodium chloride 0.9 % 100 mL IVPB     2 g 200 mL/hr over 30 Minutes Intravenous Every 4 hours 08/06/19 1359     08/03/19 0900  fluconazole (DIFLUCAN) tablet 200 mg     200 mg Oral  Once 08/03/19 0842 08/03/19 0917   08/02/19 2200  ceFAZolin (ANCEF) IVPB 2g/100 mL premix  Status:  Discontinued     2 g 200 mL/hr over 30 Minutes Intravenous Every 8 hours 08/02/19 1534 08/06/19 1310   08/02/19 1600  metroNIDAZOLE (FLAGYL) tablet 500 mg  Status:  Discontinued     500 mg Oral Every 8 hours 08/02/19 1534 08/06/19 1454   08/01/19 1200  vancomycin (VANCOCIN) IVPB 750 mg/150 ml premix  Status:  Discontinued     750 mg 150 mL/hr over 60 Minutes Intravenous Every 8 hours 08/01/19 0224 08/02/19 1533   08/01/19 1130  ceFAZolin (ANCEF) IVPB 2g/100 mL premix     2 g 200 mL/hr over 30 Minutes Intravenous 30 min pre-op 08/01/19 0828 08/01/19 1319   08/01/19 1000  ceFEPIme (MAXIPIME) 2 g in sodium chloride 0.9 % 100 mL IVPB  Status:  Discontinued     2 g 200 mL/hr over 30 Minutes Intravenous Every 8 hours 08/01/19 0224 08/02/19 1533   08/01/19 0100  ceFEPIme (MAXIPIME) 2 g in sodium chloride 0.9 % 100 mL IVPB     2 g 200 mL/hr over 30 Minutes Intravenous  Once 08/01/19 0056 08/01/19 0335   08/01/19 0100  metroNIDAZOLE (FLAGYL) IVPB 500 mg     500 mg 100 mL/hr over 60 Minutes Intravenous  Once 08/01/19 0056 08/01/19 0335   08/01/19 0100  vancomycin (VANCOCIN) IVPB 1000 mg/200 mL premix     1,000 mg 200 mL/hr over 60 Minutes Intravenous  Once 08/01/19 0056 08/01/19 0335       Subjective:  Patient seen and examined the bedside this morning.  Hemodynamically stable.  Comfortable.  Working on his computer at the bedside.  Denies any new complaints today.  His lower abdominal discomfort has improved.  Objective: Vitals:   08/25/19 1207 08/25/19 1926 08/26/19 0108 08/26/19 0835  BP: 130/89 122/87  135/85  Pulse: 84 99  (!) 107  Resp: 16 18  16   Temp: 98.5 F (36.9 C) 98.1 F (36.7 C)    TempSrc: Oral Oral    SpO2: 99% 100%  100%  Weight:   78.8 kg   Height:        Intake/Output Summary (Last 24 hours) at 08/26/2019 1042 Last data filed at 08/26/2019 0625 Gross per 24 hour  Intake -  Output 1375 ml  Net -1375 ml   Filed Weights   08/11/19 0401 08/15/19 0500 08/26/19 0108  Weight: 80.6 kg 77.1 kg 78.8 kg    Examination:  General exam: Appears calm and comfortable ,Not in distress,average built HEENT:PERRL,Oral mucosa moist, Ear/Nose normal on gross exam Respiratory system: Bilateral equal air entry, normal vesicular breath sounds, no wheezes or crackles  Cardiovascular system: S1 & S2 heard, RRR. No JVD, murmurs, rubs, gallops or clicks. No pedal edema. Gastrointestinal system: Abdomen is nondistended, soft and nontender. No organomegaly or masses felt. Normal bowel sounds heard. Central nervous system: Alert and oriented. No focal neurological deficits. Extremities: No edema, no clubbing ,no cyanosis, distal peripheral pulses palpable. Skin: tattos,No rashes, lesions or ulcers,no icterus ,no pallor     Data Reviewed: I have personally reviewed following labs and imaging studies  CBC: Recent Labs  Lab 08/23/19 0623  WBC 5.6  NEUTROABS 4.0  HGB 9.3*  HCT 28.2*  MCV 95.6  PLT 951   Basic Metabolic Panel: Recent Labs  Lab 08/23/19 0623  NA 141  K 3.9  CL 103  CO2 26  GLUCOSE 125*  BUN 13  CREATININE 0.75  CALCIUM 8.6*  MG 1.9   GFR: Estimated Creatinine Clearance: 139.5 mL/min (by C-G formula based on SCr of 0.75 mg/dL).  Liver Function Tests: Recent Labs  Lab 08/23/19 0623  AST 15  ALT 14  ALKPHOS 107  BILITOT 0.6  PROT 6.6  ALBUMIN 1.8*   No results for input(s): LIPASE, AMYLASE in the last 168 hours. No results for input(s): AMMONIA in the last 168 hours. Coagulation Profile: No results for input(s): INR, PROTIME in the last 168 hours. Cardiac Enzymes: No results for input(s): CKTOTAL, CKMB, CKMBINDEX, TROPONINI in the last 168 hours. BNP (last 3 results) No results for input(s): PROBNP in the last 8760 hours. HbA1C: No results for input(s): HGBA1C in the last 72 hours. CBG: Recent Labs  Lab 08/25/19 1204 08/25/19 1545 08/25/19 2154 08/26/19 0625 08/26/19 0831  GLUCAP 88 141* 245* 185* 74   Lipid Profile: No results for input(s): CHOL, HDL, LDLCALC, TRIG, CHOLHDL, LDLDIRECT in the last 72 hours. Thyroid Function Tests: No results for input(s): TSH, T4TOTAL, FREET4, T3FREE, THYROIDAB in the last 72 hours. Anemia Panel: No results for input(s): VITAMINB12, FOLATE, FERRITIN, TIBC, IRON, RETICCTPCT in the last 72 hours. Sepsis Labs: No results for input(s): PROCALCITON, LATICACIDVEN in the last 168 hours.  No results found for this or any previous visit (from the past 240 hour(s)).       Radiology Studies: No results found.      Scheduled Meds: . Chlorhexidine Gluconate Cloth  6 each Topical Daily  . docusate sodium  100 mg Oral Daily  . insulin aspart  0-15 Units Subcutaneous TID WC  . insulin aspart  0-5 Units Subcutaneous QHS  . insulin aspart  5 Units Subcutaneous TID WC  . insulin glargine  20 Units Subcutaneous Daily  . magnesium oxide  400 mg Oral BID  . Melatonin  6 mg Oral Once  . metoprolol tartrate  12.5 mg Oral BID  . ondansetron  4 mg Intravenous Once  . potassium chloride  10 mEq Oral TID  . Ensure Max Protein  11 oz Oral BID  . sodium chloride flush  10-40 mL Intracatheter Q12H   Continuous Infusions: . sodium chloride 1,000 mL (08/24/19 2247)  .  lactated ringers 10 mL/hr at 08/10/19 0845  . nafcillin IV 2 g (08/26/19 0940)     LOS: 25 days    Time spent:35 mins. More than 50% of that time was spent in  counseling and/or coordination of care.      Burnadette PopAmrit Seara Hinesley, MD [facility] Pager [phone]  If 7PM-7AM, please contact night-coverage [email or web address] Password [email or web address] 08/26/2019, 10:42 AM

## 2019-08-27 LAB — GLUCOSE, CAPILLARY
Glucose-Capillary: 150 mg/dL — ABNORMAL HIGH (ref 70–99)
Glucose-Capillary: 165 mg/dL — ABNORMAL HIGH (ref 70–99)
Glucose-Capillary: 104 mg/dL — ABNORMAL HIGH (ref 70–99)
Glucose-Capillary: 182 mg/dL — ABNORMAL HIGH (ref 70–99)
Glucose-Capillary: 129 mg/dL — ABNORMAL HIGH (ref 70–99)
Glucose-Capillary: 127 mg/dL — ABNORMAL HIGH (ref 70–99)
Glucose-Capillary: 132 mg/dL — ABNORMAL HIGH (ref 70–99)

## 2019-08-27 NOTE — Progress Notes (Addendum)
Physical Therapy Treatment Patient Details Name: William [name] MRN: [id number] DOB: 11/07/1981 Today's Date: 08/27/2019    History of Present Illness Patient is a 38 y/o male who presents with lower back pain, left shoulder pain, right foot and heel pain. Found to have right heel abscess s/p I&D 9/4, left upper chest wall abscess and mass s/p I&D and wound vac application 9/3, new onset DM, sepsis with MSSA bacteremia due to IV drug use. CT scan- left anterior chest wall soft tissue and intramuscular infection with extension into the thoracic cavity, air in the soft tissues of the right axilla adjacent to the right shoulder, multiple small pulmonary nodules. PMH- heroin IV drug use.    PT Comments    Patient received in bed, very pleasant and motivated to work with therapy, reports he has been up and walking in the hall with nursing and family regularly; has started ibuprofen and it along with PT stretches have been helping back/SIJ pain quite a bit. Able to perform all mobility with S and extended time today, and gait speed and tolerance continue to improve- continues to be able to tolerate gait distances of approximately 830f with PT and RW. Reviewed lumbar exercises for back/SIJ pain, also introduced lumbar arching and flatting as well as towel roll for improved lumbar support when up in chair, patient and family able to demonstrate and verbally teach back all exercises today. He was left in bed with all needs met, family present this afternoon.     Follow Up Recommendations  Home health PT;Supervision for mobility/OOB     Equipment Recommendations  Rolling walker with 5" wheels    Recommendations for Other Services       Precautions / Restrictions Precautions Precautions: Fall Precaution Comments: weakness, chest wound vac pulled 08/24/19, probably SI joint dysfunction Restrictions Weight Bearing Restrictions: No    Mobility  Bed Mobility Overal bed mobility: Needs  Assistance Bed Mobility: Rolling;Supine to Sit;Sit to Supine Rolling: Supervision Sidelying to sit: Supervision     Sit to sidelying: Supervision General bed mobility comments: S for all bed mobility, extended time  Transfers Overall transfer level: Needs assistance Equipment used: Rolling walker (2 wheeled) Transfers: Sit to/from Stand Sit to Stand: From elevated surface Stand pivot transfers: Supervision       General transfer comment: used cane to power up (difficulty using RW due to R shoulder pain), S for safety and no physical assist given  Ambulation/Gait Ambulation/Gait assistance: Supervision Gait Distance (Feet): 800 Feet Assistive device: Rolling walker (2 wheeled) Gait Pattern/deviations: Step-through pattern;Decreased stride length Gait velocity: decreased   General Gait Details: guarded but steady gait; gait speed much improved over past session   Stairs             Wheelchair Mobility    Modified Rankin (Stroke Patients Only)       Balance Overall balance assessment: Needs assistance Sitting-balance support: Feet supported;No upper extremity supported Sitting balance-Leahy Scale: Normal     Standing balance support: Bilateral upper extremity supported;During functional activity Standing balance-Leahy Scale: Fair Standing balance comment: RW for gait, but able to turn in room without UE support                            Cognition Arousal/Alertness: Awake/alert Behavior During Therapy: WFL for tasks assessed/performed Overall Cognitive Status: Within Functional Limits for tasks assessed  Exercises      General Comments        Pertinent Vitals/Pain Pain Assessment: No/denies pain Pain Score: 0-No pain Faces Pain Scale: No hurt Pain Intervention(s): Limited activity within patient's tolerance;Monitored during session    Home Living                       Prior Function            PT Goals (current goals can now be found in the care plan section) Acute Rehab PT Goals Patient Stated Goal: to get home PT Goal Formulation: With patient Time For Goal Achievement: 08/27/19 Potential to Achieve Goals: Good Progress towards PT goals: Progressing toward goals    Frequency    Min 3X/week      PT Plan Current plan remains appropriate    Co-evaluation              AM-PAC PT "6 Clicks" Mobility   Outcome Measure  Help needed turning from your back to your side while in a flat bed without using bedrails?: None Help needed moving from lying on your back to sitting on the side of a flat bed without using bedrails?: A Little Help needed moving to and from a bed to a chair (including a wheelchair)?: A Little Help needed standing up from a chair using your arms (e.g., wheelchair or bedside chair)?: A Little Help needed to walk in hospital room?: A Little Help needed climbing 3-5 steps with a railing? : A Little 6 Click Score: 19    End of Session   Activity Tolerance: Patient tolerated treatment well Patient left: in bed;with call bell/phone within reach;with bed alarm set;with family/visitor present   PT Visit Diagnosis: Muscle weakness (generalized) (M62.81);Pain;Difficulty in walking, not elsewhere classified (R26.2) Pain - Right/Left: Left Pain - part of body: (back/SIJ)     Time: 4315-4008 PT Time Calculation (min) (ACUTE ONLY): 28 min  Charges:  $Gait Training: 8-22 mins $Self Care/Home Management: 8-22                     Deniece Ree PT, DPT, CBIS  Supplemental Physical Therapist [place]    Pager [phone] Acute Rehab Office [phone]

## 2019-08-27 NOTE — Progress Notes (Signed)
PROGRESS NOTE    William [name]  ZOX:[id number] DOB: 1981-02-04 DOA: 07/31/2019 PCP: Patient, No Pcp Per   Brief Narrative:  Patient is a 38 year old male with history of IV drug abuse who was initially admitted with multiple complaints including lower back pain, left shoulder pain, right foot/heel pain.  Imaging done on presentation showed  right heel abscess, left upper chest wall abscess and mass.  CT scan showed left anterior chest wall soft tissue and intramuscular infection with extension into thoracic cavity, air in the soft tissue of the right axillary region to the right shoulder, multiple long nodules.  Patient was initially admitted to ICU.  She underwent surgical debridement of the chest wall abscess and wound VAC placement which was removed on 9/25.  He was also found to have new onset diabetes here.  MRI had shown multiple epidural and paraspinal abscesses.  Blood cultures/wound cultures showed MSSA.  ID was initially following.  Started on nafcillin via midline.  Last day of antibiotics is 10/28 as per ID who recommended 8 weeks of antibiotics starting from 07/31/2019.  Anticipate inpatient hospitalization until he completes antibiotic therapy.  Assessment & Plan:   Principal Problem:   Staphylococcus aureus bacteremia Active Problems:   Sepsis (HCC)   Abscess of paraspinous muscles   Osteomyelitis of lumbar spine (HCC)   Epidural abscess of spine due to infective embolism   Abnormal chest CT   Septic embolism (HCC)   Community acquired pneumonia   IV drug abuse (HCC)   Heroin abuse (HCC)   Elevated troponin   UTI (urinary tract infection)   Renal failure   Hyperglycemia   Suspected endocarditis   Chest wall abscess   Abscess of right heel   Acute osteomyelitis of lumbar spine (HCC)   Sepsis secondary to MSSA bacteremia secondary to drug abuse: History of IV drug abuse.  Injects heroin.  Presented with multiple abscesses as above.  Underwent incision and drainage and  debridement of left chest wall with wound VAC and was being followed by CT CS.  Status post bedside I&D right heel.  Wound culture, blood cultures showing MSSA.  Also had multiple epidural abscess/discitis was seen by neurosurgery, no recommendation for surgery.  Intraoperative TEE did not show any evidence of endocarditis.   Currently on nafcillin which will be continued through 10/28.  Abdominal discomfort/generalized body ache: Improved.  Continue muscle relaxants/NSAIDs.  Right shoulder septic bursitis: Complain of stiffness of the right shoulder earlier.  CT showed septic bursitis without evidence of  Osteomyelitis/septic arthitis.  Orthopedics was following.  Status post arthrocentesis which were suggestive of septic bursitis.  Plan is to continue IV antibiotics.  New onset diabetes mellitus: Hemoglobin A1c of 7.8.  Continue current regimen.  Anti-hyper glycemic can be changed to oral on discharge.  Hypokalemia: Currently stable  Normocytic normochromic anemia: Currently H&H is stable  IV drug abuse: Counseled for cessation.  Patient is motivated to quit.  We will consider communicating with IM teaching service  when he is closer to the discharge so that he can follow-up as an outpatient.  Service/addiction clinic  Nutrition Problem: Increased nutrient needs Etiology: wound healing      DVT prophylaxis:Lovenox Code Status: Full Family Communication: None present at the bedside Disposition Plan: Home after completion of antibiotic therapy   Consultants: PCCM, see TCS, neurosurgery, ID  Procedures: Chest wall abscess incision/drainage right heel incision/drainage  Antimicrobials:  Anti-infectives (From admission, onward)   Start     Dose/Rate Route Frequency Ordered  Stop   08/10/19 0936  vancomycin (VANCOCIN) 1,000 mg in sodium chloride 0.9 % 1,000 mL irrigation  Status:  Discontinued       As needed 08/10/19 0937 08/10/19 1121   08/10/19 0600  ceFAZolin (ANCEF) IVPB 2g/100  mL premix     2 g 200 mL/hr over 30 Minutes Intravenous To Short Stay 08/10/19 0036 08/10/19 0625   08/09/19 1730  ceFAZolin (ANCEF) IVPB 2g/100 mL premix  Status:  Discontinued     2 g 200 mL/hr over 30 Minutes Intravenous 30 min pre-op 08/09/19 1730 08/10/19 0036   08/06/19 1430  nafcillin injection 2 g  Status:  Discontinued     2 g Intravenous Every 4 hours 08/06/19 1310 08/06/19 1359   08/06/19 1430  nafcillin 2 g in sodium chloride 0.9 % 100 mL IVPB     2 g 200 mL/hr over 30 Minutes Intravenous Every 4 hours 08/06/19 1359     08/03/19 0900  fluconazole (DIFLUCAN) tablet 200 mg     200 mg Oral  Once 08/03/19 0842 08/03/19 0917   08/02/19 2200  ceFAZolin (ANCEF) IVPB 2g/100 mL premix  Status:  Discontinued     2 g 200 mL/hr over 30 Minutes Intravenous Every 8 hours 08/02/19 1534 08/06/19 1310   08/02/19 1600  metroNIDAZOLE (FLAGYL) tablet 500 mg  Status:  Discontinued     500 mg Oral Every 8 hours 08/02/19 1534 08/06/19 1454   08/01/19 1200  vancomycin (VANCOCIN) IVPB 750 mg/150 ml premix  Status:  Discontinued     750 mg 150 mL/hr over 60 Minutes Intravenous Every 8 hours 08/01/19 0224 08/02/19 1533   08/01/19 1130  ceFAZolin (ANCEF) IVPB 2g/100 mL premix     2 g 200 mL/hr over 30 Minutes Intravenous 30 min pre-op 08/01/19 0828 08/01/19 1319   08/01/19 1000  ceFEPIme (MAXIPIME) 2 g in sodium chloride 0.9 % 100 mL IVPB  Status:  Discontinued     2 g 200 mL/hr over 30 Minutes Intravenous Every 8 hours 08/01/19 0224 08/02/19 1533   08/01/19 0100  ceFEPIme (MAXIPIME) 2 g in sodium chloride 0.9 % 100 mL IVPB     2 g 200 mL/hr over 30 Minutes Intravenous  Once 08/01/19 0056 08/01/19 0335   08/01/19 0100  metroNIDAZOLE (FLAGYL) IVPB 500 mg     500 mg 100 mL/hr over 60 Minutes Intravenous  Once 08/01/19 0056 08/01/19 0335   08/01/19 0100  vancomycin (VANCOCIN) IVPB 1000 mg/200 mL premix     1,000 mg 200 mL/hr over 60 Minutes Intravenous  Once 08/01/19 0056 08/01/19 0335       Subjective:  Patient seen and examined the bedside this morning.  Hemodynamically stable.  Sleeping, no complaints Objective: Vitals:   08/26/19 1916 08/27/19 0430 08/27/19 0838 08/27/19 1101  BP:   (!) 128/91 128/81  Pulse: 94  93 84  Resp: 18  15 17   Temp: 98.8 F (37.1 C)  98.9 F (37.2 C) 98 F (36.7 C)  TempSrc: Oral  Oral Oral  SpO2: 100%  100% 100%  Weight:  78.5 kg    Height:        Intake/Output Summary (Last 24 hours) at 08/27/2019 1123 Last data filed at 08/27/2019 0844 Gross per 24 hour  Intake -  Output 700 ml  Net -700 ml   Filed Weights   08/15/19 0500 08/26/19 0108 08/27/19 0430  Weight: 77.1 kg 78.8 kg 78.5 kg    Examination:  General exam: Appears calm and  comfortable ,Not in distress,average built Respiratory system: Bilateral equal air entry, normal vesicular breath sounds, no wheezes or crackles  Cardiovascular system: S1 & S2 heard, RRR. No JVD, murmurs, rubs, gallops or clicks. No pedal edema. I and D wound on the left chest,healing well Gastrointestinal system: Abdomen is nondistended, soft and nontender. No organomegaly or masses felt. Normal bowel sounds heard. Central nervous system: Alert and oriented. No focal neurological deficits. Extremities: No edema, no clubbing ,no cyanosis, distal peripheral pulses palpable. Skin: tattos,No rashes, lesions or ulcers,no icterus ,no pallor     Data Reviewed: I have personally reviewed following labs and imaging studies  CBC: Recent Labs  Lab 08/23/19 0623  WBC 5.6  NEUTROABS 4.0  HGB 9.3*  HCT 28.2*  MCV 95.6  PLT 399   Basic Metabolic Panel: Recent Labs  Lab 08/23/19 0623  NA 141  K 3.9  CL 103  CO2 26  GLUCOSE 125*  BUN 13  CREATININE 0.75  CALCIUM 8.6*  MG 1.9   GFR: Estimated Creatinine Clearance: 139 mL/min (by C-G formula based on SCr of 0.75 mg/dL). Liver Function Tests: Recent Labs  Lab 08/23/19 0623  AST 15  ALT 14  ALKPHOS 107  BILITOT 0.6  PROT 6.6  ALBUMIN  1.8*   No results for input(s): LIPASE, AMYLASE in the last 168 hours. No results for input(s): AMMONIA in the last 168 hours. Coagulation Profile: No results for input(s): INR, PROTIME in the last 168 hours. Cardiac Enzymes: No results for input(s): CKTOTAL, CKMB, CKMBINDEX, TROPONINI in the last 168 hours. BNP (last 3 results) No results for input(s): PROBNP in the last 8760 hours. HbA1C: No results for input(s): HGBA1C in the last 72 hours. CBG: Recent Labs  Lab 08/26/19 1139 08/26/19 1627 08/26/19 2151 08/27/19 0619 08/27/19 1104  GLUCAP 204* 102* 156* 182* 129*   Lipid Profile: No results for input(s): CHOL, HDL, LDLCALC, TRIG, CHOLHDL, LDLDIRECT in the last 72 hours. Thyroid Function Tests: No results for input(s): TSH, T4TOTAL, FREET4, T3FREE, THYROIDAB in the last 72 hours. Anemia Panel: No results for input(s): VITAMINB12, FOLATE, FERRITIN, TIBC, IRON, RETICCTPCT in the last 72 hours. Sepsis Labs: No results for input(s): PROCALCITON, LATICACIDVEN in the last 168 hours.  No results found for this or any previous visit (from the past 240 hour(s)).       Radiology Studies: No results found.      Scheduled Meds: . Chlorhexidine Gluconate Cloth  6 each Topical Daily  . docusate sodium  100 mg Oral Daily  . insulin aspart  0-15 Units Subcutaneous TID WC  . insulin aspart  0-5 Units Subcutaneous QHS  . insulin aspart  5 Units Subcutaneous TID WC  . insulin glargine  20 Units Subcutaneous Daily  . magnesium oxide  400 mg Oral BID  . Melatonin  6 mg Oral Once  . metoprolol tartrate  12.5 mg Oral BID  . ondansetron  4 mg Intravenous Once  . potassium chloride  10 mEq Oral TID  . Ensure Max Protein  11 oz Oral BID  . sodium chloride flush  10-40 mL Intracatheter Q12H   Continuous Infusions: . sodium chloride 1,000 mL (08/24/19 2247)  . lactated ringers 10 mL/hr at 08/10/19 0845  . nafcillin IV 2 g (08/27/19 0848)     LOS: 26 days    Time spent:35  mins. More than 50% of that time was spent in counseling and/or coordination of care.      Burnadette Pop, MD [facility] Pager  [phone]  If 7PM-7AM, please contact night-coverage [email or web address] Password [email or web address] 08/27/2019, 11:23 AM

## 2019-08-27 NOTE — Progress Notes (Addendum)
17 Days Post-Op Procedure(s) (LRB): WOUND DEBRIDEMENT (N/A) WOUND VAC CHANGE (N/A) Subjective: Resting in bed. No new problems or pain related to the left chest wound.   Objective: Vital signs in last 24 hours: Temp:  [98.2 F (36.8 C)-99 F (37.2 C)] 98.9 F (37.2 C) (09/28 0838) Pulse Rate:  [88-94] 93 (09/28 0838) Resp:  [15-18] 15 (09/28 0838) BP: (123-136)/(76-91) 128/91 (09/28 0838) SpO2:  [100 %] 100 % (09/28 0838) Weight:  [78.5 kg] 78.5 kg (09/28 0430)  Hemodynamic parameters for last 24 hours:    Intake/Output from previous day: No intake/output data recorded. Intake/Output this shift: Total I/O In: -  Out: 700 [Urine:700]  General appearance: alert, cooperative and no distress Wound: The left chest wound dressing was removed. The wound is clean and granulating. Minimal drainage and no eschar. A new saline moistend dressing was placed.   Lab Results: No results for input(s): WBC, HGB, HCT, PLT in the last 72 hours. BMET: No results for input(s): NA, K, CL, CO2, GLUCOSE, BUN, CREATININE, CALCIUM in the last 72 hours.  PT/INR: No results for input(s): LABPROT, INR in the last 72 hours. ABG    Component Value Date/Time   PHART 7.482 (H) 08/01/2019 0909   HCO3 31.0 (H) 08/01/2019 0909   TCO2 32 08/01/2019 0909   O2SAT 97.0 08/01/2019 0909   CBG (last 3)  Recent Labs    08/26/19 1627 08/26/19 2151 08/27/19 0619  GLUCAP 102* 156* 182*    Assessment/Plan: S/P Procedure(s) (LRB): WOUND DEBRIDEMENT (N/A) WOUND VAC CHANGE (N/A)  -POD-25incision and drainage of the left [place] joint abscess and subsequent wound vac placement and POD18excisional debridement in the OR.  The wound vac was discontinued on 9/29 and saline moist to dry dressings have been changed daily. Today, the wound appearsclean andis continuing to granulate nicely.There is no significant drainage. Continue saline moist to dry dressings daily.  Will continue to follow.    LOS: 26 days     Antony Odea, PA-C [phone] 08/27/2019 I have seen and examined William [name] and agree with the above assessment  and plan.  Grace Isaac MD Beeper [phone] Office [phone] 08/28/2019 12:10 PM

## 2019-08-28 LAB — GLUCOSE, CAPILLARY
Glucose-Capillary: 106 mg/dL — ABNORMAL HIGH (ref 70–99)
Glucose-Capillary: 173 mg/dL — ABNORMAL HIGH (ref 70–99)
Glucose-Capillary: 128 mg/dL — ABNORMAL HIGH (ref 70–99)
Glucose-Capillary: 219 mg/dL — ABNORMAL HIGH (ref 70–99)

## 2019-08-28 MED ORDER — IBUPROFEN 200 MG PO TABS
800.0000 mg | ORAL_TABLET | Freq: Four times a day (QID) | ORAL | Status: DC | PRN
  Administered 2019-08-28 – 2019-10-03 (×72): 800 mg via ORAL
  Filled 2019-08-28 (×75): qty 4

## 2019-08-28 NOTE — Progress Notes (Signed)
PROGRESS NOTE    William [name]  WIO:[id number] DOB: 11-Apr-1981 DOA: 07/31/2019 PCP: Patient, No Pcp Per   Brief Narrative:  Patient is a 38 year old male with history of IV drug abuse who was initially admitted with multiple complaints including lower back pain, left shoulder pain, right foot/heel pain.  Imaging done on presentation showed  right heel abscess, left upper chest wall abscess and mass.  CT scan showed left anterior chest wall soft tissue and intramuscular infection with extension into thoracic cavity, air in the soft tissue of the right axillary region to the right shoulder, multiple long nodules.  Patient was initially admitted to ICU.  She underwent surgical debridement of the chest wall abscess and wound VAC placement which was removed on 9/25.  He was also found to have new onset diabetes here.  MRI had shown multiple epidural and paraspinal abscesses.  Blood cultures/wound cultures showed MSSA.  ID was initially following.  Started on nafcillin via midline.  Last day of antibiotics is 10/28 as per ID who recommended 8 weeks of antibiotics starting from 07/31/2019.  Anticipate inpatient hospitalization until he completes antibiotic therapy.  Assessment & Plan:   Principal Problem:   Staphylococcus aureus bacteremia Active Problems:   Sepsis ([place])   Abscess of paraspinous muscles   Osteomyelitis of lumbar spine (HCC)   Epidural abscess of spine due to infective embolism   Abnormal chest CT   Septic embolism ([place])   Community acquired pneumonia   IV drug abuse ([place])   Heroin abuse ([place])   Elevated troponin   UTI (urinary tract infection)   Renal failure   Hyperglycemia   Suspected endocarditis   Chest wall abscess   Abscess of right heel   Acute osteomyelitis of lumbar spine (HCC)   Sepsis secondary to MSSA bacteremia secondary to drug abuse: History of IV drug abuse.  Injects heroin.  Presented with multiple abscesses as above.  Underwent incision and drainage and  debridement of left chest wall with wound VAC and was being followed by CT CS.  Status post bedside I&D right heel.  Wound culture, blood cultures showing MSSA.  Also had multiple epidural abscess/discitis was seen by neurosurgery, no recommendation for surgery.  Intraoperative TEE did not show any evidence of endocarditis.   Currently on nafcillin which will be continued through 10/28.  Abdominal discomfort/generalized body ache: Improved.  Continue muscle relaxants/NSAIDs.  Right shoulder septic bursitis:   CT showed septic bursitis without evidence of  Osteomyelitis/septic arthitis.  Orthopedics was following.  Status post arthrocentesis which were suggestive of septic bursitis.  Plan is to continue IV antibiotics.  New onset diabetes mellitus: Hemoglobin A1c of 7.8.  Continue current regimen.  Anti-hyperglycemics can be changed to oral on discharge.  Hypokalemia: Currently stable  Normocytic normochromic anemia: Currently H&H is stable  IV drug abuse: Counseled for cessation.  Patient is motivated to quit.  We will consider communicating with IM teaching service  when he is closer to the discharge so that he can follow-up as an outpatient internal medicine  service/addiction clinic  Nutrition Problem: Increased nutrient needs Etiology: wound healing      DVT prophylaxis:Lovenox Code Status: Full Family Communication: None present at the bedside Disposition Plan: Home after completion of antibiotic therapy   Consultants: PCCM, see TCS, neurosurgery, ID  Procedures: Chest wall abscess incision/drainage right heel incision/drainage  Antimicrobials:  Anti-infectives (From admission, onward)   Start     Dose/Rate Route Frequency Ordered Stop   08/10/19 [id number]  vancomycin (VANCOCIN) 1,000 mg in sodium chloride 0.9 % 1,000 mL irrigation  Status:  Discontinued       As needed 08/10/19 0937 08/10/19 1121   08/10/19 0600  ceFAZolin (ANCEF) IVPB 2g/100 mL premix     2 g 200 mL/hr over  30 Minutes Intravenous To Short Stay 08/10/19 0036 08/10/19 0625   08/09/19 1730  ceFAZolin (ANCEF) IVPB 2g/100 mL premix  Status:  Discontinued     2 g 200 mL/hr over 30 Minutes Intravenous 30 min pre-op 08/09/19 1730 08/10/19 0036   08/06/19 1430  nafcillin injection 2 g  Status:  Discontinued     2 g Intravenous Every 4 hours 08/06/19 1310 08/06/19 1359   08/06/19 1430  nafcillin 2 g in sodium chloride 0.9 % 100 mL IVPB     2 g 200 mL/hr over 30 Minutes Intravenous Every 4 hours 08/06/19 1359     08/03/19 0900  fluconazole (DIFLUCAN) tablet 200 mg     200 mg Oral  Once 08/03/19 0842 08/03/19 0917   08/02/19 2200  ceFAZolin (ANCEF) IVPB 2g/100 mL premix  Status:  Discontinued     2 g 200 mL/hr over 30 Minutes Intravenous Every 8 hours 08/02/19 1534 08/06/19 1310   08/02/19 1600  metroNIDAZOLE (FLAGYL) tablet 500 mg  Status:  Discontinued     500 mg Oral Every 8 hours 08/02/19 1534 08/06/19 1454   08/01/19 1200  vancomycin (VANCOCIN) IVPB 750 mg/150 ml premix  Status:  Discontinued     750 mg 150 mL/hr over 60 Minutes Intravenous Every 8 hours 08/01/19 0224 08/02/19 1533   08/01/19 1130  ceFAZolin (ANCEF) IVPB 2g/100 mL premix     2 g 200 mL/hr over 30 Minutes Intravenous 30 min pre-op 08/01/19 0828 08/01/19 1319   08/01/19 1000  ceFEPIme (MAXIPIME) 2 g in sodium chloride 0.9 % 100 mL IVPB  Status:  Discontinued     2 g 200 mL/hr over 30 Minutes Intravenous Every 8 hours 08/01/19 0224 08/02/19 1533   08/01/19 0100  ceFEPIme (MAXIPIME) 2 g in sodium chloride 0.9 % 100 mL IVPB     2 g 200 mL/hr over 30 Minutes Intravenous  Once 08/01/19 0056 08/01/19 0335   08/01/19 0100  metroNIDAZOLE (FLAGYL) IVPB 500 mg     500 mg 100 mL/hr over 60 Minutes Intravenous  Once 08/01/19 0056 08/01/19 0335   08/01/19 0100  vancomycin (VANCOCIN) IVPB 1000 mg/200 mL premix     1,000 mg 200 mL/hr over 60 Minutes Intravenous  Once 08/01/19 0056 08/01/19 0335      Subjective:  Patient seen and examined  the bedside this morning.  Hemodynamically stable.  He was complains of generalized body ache that improved with ibuprofen.  No other new complaints.  Objective: Vitals:   08/27/19 1541 08/27/19 2054 08/28/19 0547 08/28/19 0735  BP: (!) 134/93 128/87 (!) 130/91 (!) 143/96  Pulse: 93 (!) 101 98 87  Resp: 20 (!) 22 20 17   Temp: 98.3 F (36.8 C) 97.8 F (36.6 C) 99.5 F (37.5 C) 98.5 F (36.9 C)  TempSrc: Oral Oral Oral Oral  SpO2: 100% 99% 100% 100%  Weight:      Height:        Intake/Output Summary (Last 24 hours) at 08/28/2019 1136 Last data filed at 08/27/2019 1742 Gross per 24 hour  Intake -  Output 500 ml  Net -500 ml   Filed Weights   08/15/19 0500 08/26/19 0108 08/27/19 0430  Weight: 77.1 kg  78.8 kg 78.5 kg    Examination:  General exam: Appears calm and comfortable ,Not in distress,average built Respiratory system: Bilateral equal air entry, normal vesicular breath sounds, no wheezes or crackles  Cardiovascular system: S1 & S2 heard, RRR. No JVD, murmurs, rubs, gallops or clicks. No pedal edema. Healing  I and D wound on the chest Gastrointestinal system: Abdomen is nondistended, soft and nontender. No organomegaly or masses felt. Normal bowel sounds heard. Central nervous system: Alert and oriented. No focal neurological deficits. Extremities: No edema, no clubbing ,no cyanosis, distal peripheral pulses palpable. Skin:No rashes, lesions or ulcers,no icterus ,no pallor     Data Reviewed: I have personally reviewed following labs and imaging studies  CBC: Recent Labs  Lab 08/23/19 0623  WBC 5.6  NEUTROABS 4.0  HGB 9.3*  HCT 28.2*  MCV 95.6  PLT 399   Basic Metabolic Panel: Recent Labs  Lab 08/23/19 0623  NA 141  K 3.9  CL 103  CO2 26  GLUCOSE 125*  BUN 13  CREATININE 0.75  CALCIUM 8.6*  MG 1.9   GFR: Estimated Creatinine Clearance: 139 mL/min (by C-G formula based on SCr of 0.75 mg/dL). Liver Function Tests: Recent Labs  Lab 08/23/19  0623  AST 15  ALT 14  ALKPHOS 107  BILITOT 0.6  PROT 6.6  ALBUMIN 1.8*   No results for input(s): LIPASE, AMYLASE in the last 168 hours. No results for input(s): AMMONIA in the last 168 hours. Coagulation Profile: No results for input(s): INR, PROTIME in the last 168 hours. Cardiac Enzymes: No results for input(s): CKTOTAL, CKMB, CKMBINDEX, TROPONINI in the last 168 hours. BNP (last 3 results) No results for input(s): PROBNP in the last 8760 hours. HbA1C: No results for input(s): HGBA1C in the last 72 hours. CBG: Recent Labs  Lab 08/27/19 0619 08/27/19 1104 08/27/19 1741 08/27/19 2127 08/28/19 0611  GLUCAP 182* 129* 127* 132* 106*   Lipid Profile: No results for input(s): CHOL, HDL, LDLCALC, TRIG, CHOLHDL, LDLDIRECT in the last 72 hours. Thyroid Function Tests: No results for input(s): TSH, T4TOTAL, FREET4, T3FREE, THYROIDAB in the last 72 hours. Anemia Panel: No results for input(s): VITAMINB12, FOLATE, FERRITIN, TIBC, IRON, RETICCTPCT in the last 72 hours. Sepsis Labs: No results for input(s): PROCALCITON, LATICACIDVEN in the last 168 hours.  No results found for this or any previous visit (from the past 240 hour(s)).       Radiology Studies: No results found.      Scheduled Meds: . Chlorhexidine Gluconate Cloth  6 each Topical Daily  . docusate sodium  100 mg Oral Daily  . insulin aspart  0-15 Units Subcutaneous TID WC  . insulin aspart  0-5 Units Subcutaneous QHS  . insulin aspart  5 Units Subcutaneous TID WC  . insulin glargine  20 Units Subcutaneous Daily  . magnesium oxide  400 mg Oral BID  . Melatonin  6 mg Oral Once  . metoprolol tartrate  12.5 mg Oral BID  . ondansetron  4 mg Intravenous Once  . potassium chloride  10 mEq Oral TID  . Ensure Max Protein  11 oz Oral BID  . sodium chloride flush  10-40 mL Intracatheter Q12H   Continuous Infusions: . sodium chloride 1,000 mL (08/24/19 2247)  . lactated ringers 10 mL/hr at 08/10/19 0845  .  nafcillin IV 2 g (08/28/19 0914)     LOS: 27 days    Time spent:35 mins. More than 50% of that time was spent in counseling and/or coordination  of care.      Burnadette Pop, MD [facility] Pager [phone]  If 7PM-7AM, please contact night-coverage [email or web address] Password [facility] 08/28/2019, 11:36 AM

## 2019-08-28 NOTE — Progress Notes (Signed)
Nutrition Follow-up  DOCUMENTATION CODES:   Not applicable  INTERVENTION:  Continue Ensure Max po BID, each supplement provides 150 kcal and 30 grams of protein.   Encourage adequate PO intake.   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 Pt with sepsis present on admission with MSSA bacteremia due to IV drug use. Septic pulmonary emboli, Left chest wall abscess, Right heel abscess, Extensive right posterolateral epidural abscess L2-L3, Osteomyelitis of the L5-S1, Posterior epidural abscess L5-S1 S2. Status post incision and drainage and debridement left chest wall on wound VAC9/3. S/p R heel I&D 9/4.9/11 wound debridement and wound VAC change. VAC discontinued 9/25. Currently on nafcillin which will be continued through 10/28.  Meal completion has been 100%. Pt currently has Ensure Max ordered and has been consuming them. RD to continue with current orders to aid in increased caloric and protein needs as well as in wound healing.    Labs and medications reviewed.   Diet Order:   Diet Order            Diet regular Room service appropriate? Yes; Fluid consistency: Thin  Diet effective now              EDUCATION NEEDS:   Education needs have been addressed  Skin:  Skin Assessment: Skin Integrity Issues: Skin Integrity Issues:: Incisions, Wound VAC Wound Vac: N/A Incisions: L chest, R ankle  Last BM:  9/27  Height:   Ht Readings from Last 1 Encounters:  08/01/19 6'  2" (1.88 m)    Weight:   Wt Readings from Last 1 Encounters:  08/27/19 78.5 kg    Ideal Body Weight:  86.36 kg  BMI:  Body mass index is 22.22 kg/m.  Estimated Nutritional Needs:   Kcal:  2150-2350  Protein:  105-120 grams  Fluid:  >/= 2.1 L/day    Corrin Parker, MS, RD, LDN Pager # [phone] After hours/ weekend pager # [phone]

## 2019-08-29 LAB — GLUCOSE, CAPILLARY
Glucose-Capillary: 91 mg/dL (ref 70–99)
Glucose-Capillary: 123 mg/dL — ABNORMAL HIGH (ref 70–99)
Glucose-Capillary: 159 mg/dL — ABNORMAL HIGH (ref 70–99)
Glucose-Capillary: 115 mg/dL — ABNORMAL HIGH (ref 70–99)

## 2019-08-29 NOTE — Progress Notes (Signed)
PROGRESS NOTE    William [name]  HYQ:[id number] DOB: 11-07-1981 DOA: 07/31/2019 PCP: William [name], No Pcp Per   Brief Narrative:  William [name] is a 38 year old male with history of IV drug abuse who was initially admitted with multiple complaints including lower back pain, left shoulder pain, right foot/heel pain.  Imaging done on presentation showed  right heel abscess, left upper chest wall abscess and mass.  CT scan showed left anterior chest wall soft tissue and intramuscular infection with extension into thoracic cavity, air in the soft tissue of the right axillary region to the right shoulder, multiple long nodules.  William [name] was initially admitted to ICU.  He underwent surgical debridement of the chest wall abscess and wound VAC placement bu CTCS,wound vac  removed on 9/25.  He was also found to have new onset diabetes here.  MRI had shown multiple epidural and paraspinal abscesses.  Blood cultures/wound cultures showed MSSA.  ID was initially following.  Started on nafcillin via midline.  Last day of antibiotics is 10/28 as per ID who recommended 8 weeks of antibiotics starting from 07/31/2019.  Anticipate inpatient hospitalization until he completes antibiotic therapy.  Assessment & Plan:   Principal Problem:   Staphylococcus aureus bacteremia Active Problems:   Sepsis (HCC)   Abscess of paraspinous muscles   Osteomyelitis of lumbar spine (HCC)   Epidural abscess of spine due to infective embolism   Abnormal chest CT   Septic embolism (HCC)   Community acquired pneumonia   IV drug abuse (HCC)   Heroin abuse (HCC)   Elevated troponin   UTI (urinary tract infection)   Renal failure   Hyperglycemia   Suspected endocarditis   Chest wall abscess   Abscess of right heel   Acute osteomyelitis of lumbar spine (HCC)   Sepsis secondary to MSSA bacteremia secondary to drug abuse: History of IV drug abuse.  Injects heroin.  Presented with multiple abscesses as above.  Underwent incision and drainage  and debridement of left chest wall with wound VAC and is being followed by CTCS.  Status post bedside I&D right heel.  Wound culture, blood cultures showed MSSA.  Also had multiple epidural abscess/discitis was seen by neurosurgery, no recommendation for surgery.  Intraoperative TEE did not show any evidence of endocarditis.   Currently on nafcillin which will be continued through 10/28.  Abdominal discomfort/generalized body ache: Improved.  Continue muscle relaxants/NSAIDs.  Right shoulder septic bursitis:   CT showed septic bursitis without evidence of  Osteomyelitis/septic arthitis.  Orthopedics was following.  Status post arthrocentesis which were suggestive of septic bursitis.  Plan is to continue IV antibiotics.  New onset diabetes mellitus: Hemoglobin A1c of 7.8.  Continue current regimen.  Anti-hyperglycemics can be changed to oral on discharge.  Hypokalemia: Currently stable  Normocytic normochromic anemia: Currently H&H is stable  IV drug abuse: Counseled for cessation.  William [name] is motivated to quit.  We will consider communicating with IM teaching service  when he is closer to the discharge so that he can follow-up as an outpatient internal medicine  service/addiction clinic  Nutrition Problem: Increased nutrient needs Etiology: wound healing      DVT prophylaxis:Lovenox Code Status: Full Family Communication: None present at the bedside Disposition Plan: Home after completion of antibiotic therapy   Consultants: PCCM, see TCS, neurosurgery, ID  Procedures: Chest wall abscess incision/drainage right heel incision/drainage  Antimicrobials:  Anti-infectives (From admission, onward)   Start     Dose/Rate Route Frequency Ordered Stop   08/10/19 [phone]  vancomycin (VANCOCIN) 1,000 mg in sodium chloride 0.9 % 1,000 mL irrigation  Status:  Discontinued       As needed 08/10/19 0937 08/10/19 1121   08/10/19 0600  ceFAZolin (ANCEF) IVPB 2g/100 mL premix     2 g 200 mL/hr over  30 Minutes Intravenous To Short Stay 08/10/19 0036 08/10/19 0625   08/09/19 1730  ceFAZolin (ANCEF) IVPB 2g/100 mL premix  Status:  Discontinued     2 g 200 mL/hr over 30 Minutes Intravenous 30 min pre-op 08/09/19 1730 08/10/19 0036   08/06/19 1430  nafcillin injection 2 g  Status:  Discontinued     2 g Intravenous Every 4 hours 08/06/19 1310 08/06/19 1359   08/06/19 1430  nafcillin 2 g in sodium chloride 0.9 % 100 mL IVPB     2 g 200 mL/hr over 30 Minutes Intravenous Every 4 hours 08/06/19 1359     08/03/19 0900  fluconazole (DIFLUCAN) tablet 200 mg     200 mg Oral  Once 08/03/19 0842 08/03/19 0917   08/02/19 2200  ceFAZolin (ANCEF) IVPB 2g/100 mL premix  Status:  Discontinued     2 g 200 mL/hr over 30 Minutes Intravenous Every 8 hours 08/02/19 1534 08/06/19 1310   08/02/19 1600  metroNIDAZOLE (FLAGYL) tablet 500 mg  Status:  Discontinued     500 mg Oral Every 8 hours 08/02/19 1534 08/06/19 1454   08/01/19 1200  vancomycin (VANCOCIN) IVPB 750 mg/150 ml premix  Status:  Discontinued     750 mg 150 mL/hr over 60 Minutes Intravenous Every 8 hours 08/01/19 0224 08/02/19 1533   08/01/19 1130  ceFAZolin (ANCEF) IVPB 2g/100 mL premix     2 g 200 mL/hr over 30 Minutes Intravenous 30 min pre-op 08/01/19 0828 08/01/19 1319   08/01/19 1000  ceFEPIme (MAXIPIME) 2 g in sodium chloride 0.9 % 100 mL IVPB  Status:  Discontinued     2 g 200 mL/hr over 30 Minutes Intravenous Every 8 hours 08/01/19 0224 08/02/19 1533   08/01/19 0100  ceFEPIme (MAXIPIME) 2 g in sodium chloride 0.9 % 100 mL IVPB     2 g 200 mL/hr over 30 Minutes Intravenous  Once 08/01/19 0056 08/01/19 0335   08/01/19 0100  metroNIDAZOLE (FLAGYL) IVPB 500 mg     500 mg 100 mL/hr over 60 Minutes Intravenous  Once 08/01/19 0056 08/01/19 0335   08/01/19 0100  vancomycin (VANCOCIN) IVPB 1000 mg/200 mL premix     1,000 mg 200 mL/hr over 60 Minutes Intravenous  Once 08/01/19 0056 08/01/19 0335      Subjective:  William [name] seen and examined  the bedside this morning.  Busy with his computer at bedside.  Comfortable.  Denies any complaints.  Slept well  Objective: Vitals:   08/28/19 1153 08/28/19 1619 08/28/19 2014 08/29/19 0834  BP: 134/86 126/85 134/83 (!) 134/96  Pulse: 88 85 (!) 105 97  Resp: 17 17 18 18   Temp: 98.3 F (36.8 C) 97.9 F (36.6 C) 99.6 F (37.6 C) 97.7 F (36.5 C)  TempSrc: Oral Oral Oral Oral  SpO2: 100% 99% 100% 97%  Weight:      Height:        Intake/Output Summary (Last 24 hours) at 08/29/2019 0958 Last data filed at 08/29/2019 0654 Gross per 24 hour  Intake 250 ml  Output 1050 ml  Net -800 ml   Filed Weights   08/15/19 0500 08/26/19 0108 08/27/19 0430  Weight: 77.1 kg 78.8 kg 78.5 kg  Examination:   General exam: Appears calm and comfortable ,Not in distress,average built HEENT:Oral mucosa moist, Ear/Nose normal on gross exam Respiratory system: Bilateral equal air entry, normal vesicular breath sounds, no wheezes or crackles.I and D wound on the chest, packed with dressing, clean ,no signs of infection Cardiovascular system: S1 & S2 heard, RRR. No JVD, murmurs, rubs, gallops or clicks. Gastrointestinal system: Abdomen is nondistended, soft and nontender. No organomegaly or masses felt. Normal bowel sounds heard. Central nervous system: Alert and oriented. No focal neurological deficits. Extremities: No edema, no clubbing ,no cyanosis, distal peripheral pulses palpable. Skin: tattos,No rashes, lesions or ulcers,no icterus ,no pallor     Data Reviewed: I have personally reviewed following labs and imaging studies  CBC: Recent Labs  Lab 08/23/19 0623  WBC 5.6  NEUTROABS 4.0  HGB 9.3*  HCT 28.2*  MCV 95.6  PLT 161   Basic Metabolic Panel: Recent Labs  Lab 08/23/19 0623  NA 141  K 3.9  CL 103  CO2 26  GLUCOSE 125*  BUN 13  CREATININE 0.75  CALCIUM 8.6*  MG 1.9   GFR: Estimated Creatinine Clearance: 139 mL/min (by C-G formula based on SCr of 0.75 mg/dL). Liver  Function Tests: Recent Labs  Lab 08/23/19 0623  AST 15  ALT 14  ALKPHOS 107  BILITOT 0.6  PROT 6.6  ALBUMIN 1.8*   No results for input(s): LIPASE, AMYLASE in the last 168 hours. No results for input(s): AMMONIA in the last 168 hours. Coagulation Profile: No results for input(s): INR, PROTIME in the last 168 hours. Cardiac Enzymes: No results for input(s): CKTOTAL, CKMB, CKMBINDEX, TROPONINI in the last 168 hours. BNP (last 3 results) No results for input(s): PROBNP in the last 8760 hours. HbA1C: No results for input(s): HGBA1C in the last 72 hours. CBG: Recent Labs  Lab 08/28/19 0611 08/28/19 1149 08/28/19 1616 08/28/19 2117 08/29/19 0601  GLUCAP 106* 173* 128* 219* 91   Lipid Profile: No results for input(s): CHOL, HDL, LDLCALC, TRIG, CHOLHDL, LDLDIRECT in the last 72 hours. Thyroid Function Tests: No results for input(s): TSH, T4TOTAL, FREET4, T3FREE, THYROIDAB in the last 72 hours. Anemia Panel: No results for input(s): VITAMINB12, FOLATE, FERRITIN, TIBC, IRON, RETICCTPCT in the last 72 hours. Sepsis Labs: No results for input(s): PROCALCITON, LATICACIDVEN in the last 168 hours.  No results found for this or any previous visit (from the past 240 hour(s)).       Radiology Studies: No results found.      Scheduled Meds: . Chlorhexidine Gluconate Cloth  6 each Topical Daily  . docusate sodium  100 mg Oral Daily  . insulin aspart  0-15 Units Subcutaneous TID WC  . insulin aspart  0-5 Units Subcutaneous QHS  . magnesium oxide  400 mg Oral BID  . Melatonin  6 mg Oral Once  . metoprolol tartrate  12.5 mg Oral BID  . ondansetron  4 mg Intravenous Once  . Ensure Max Protein  11 oz Oral BID  . sodium chloride flush  10-40 mL Intracatheter Q12H   Continuous Infusions: . sodium chloride 1,000 mL (08/24/19 2247)  . lactated ringers 10 mL/hr at 08/10/19 0845  . nafcillin IV 2 g (08/29/19 0920)     LOS: 28 days    Time spent:35 mins. More than 50% of  that time was spent in counseling and/or coordination of care.      Shelly Coss, MD [facility] Pager [phone]  If 7PM-7AM, please contact night-coverage [email or web address] Password [facility] 08/29/2019,  9:58 AM

## 2019-08-29 NOTE — Progress Notes (Signed)
Physical Therapy Treatment Patient Details Name: William [name] MRN: [id number] DOB: 1981/09/28 Today's Date: 08/29/2019    History of Present Illness Patient is a 38 y/o male who presents with lower back pain, left shoulder pain, right foot and heel pain. Found to have right heel abscess s/p I&D 9/4, left upper chest wall abscess and mass s/p I&D and wound vac application 9/3, new onset DM, sepsis with MSSA bacteremia due to IV drug use. CT scan- left anterior chest wall soft tissue and intramuscular infection with extension into the thoracic cavity, air in the soft tissues of the right axilla adjacent to the right shoulder, multiple small pulmonary nodules. PMH- heroin IV drug use.    PT Comments    Pt progressing towards goals, however, reporting increased pain in R SI joint and bilateral quads. Reviewed SI joint exercises, and attempted to add bridging, however, pt unable this session. Showed pt how to place bed in chair position with towel roll for lumbar support, as he reports recliner causes increased pain. Also educated about taking multiple walks during the day vs 1 walk to increase mobility tolerance. Current recommendations appropriate. Will continue to follow acutely to maximize functional mobility independence and safety.   Follow Up Recommendations  Home health PT;Supervision for mobility/OOB     Equipment Recommendations  Rolling walker with 5" wheels    Recommendations for Other Services       Precautions / Restrictions Precautions Precautions: Fall Precaution Comments: weakness, chest wound vac pulled 08/24/19, probable SI joint dysfunction Restrictions Weight Bearing Restrictions: No    Mobility  Bed Mobility Overal bed mobility: Needs Assistance Bed Mobility: Rolling;Supine to Sit;Sit to Supine Rolling: Supervision Sidelying to sit: Min guard     Sit to sidelying: Supervision General bed mobility comments: Pt requiring increased time to come to sitting at EOB  secondary to increased pain at R SI joint. Min guard for safety when coming up to sitting.   Transfers Overall transfer level: Needs assistance Equipment used: Rolling walker (2 wheeled) Transfers: Sit to/from Stand Sit to Stand: From elevated surface;Min guard         General transfer comment: Increased time to come to standing secondary to pain. No physical assist required.   Ambulation/Gait Ambulation/Gait assistance: Supervision Gait Distance (Feet): 800 Feet Assistive device: Rolling walker (2 wheeled) Gait Pattern/deviations: Step-through pattern;Decreased stride length Gait velocity: decreased   General Gait Details: Overall steady gait. Improved gait speed with increased distance. Supervision for safety. Initially heavy reliance on UEs, however, decreased reliance at end of gait.    Stairs             Wheelchair Mobility    Modified Rankin (Stroke Patients Only)       Balance Overall balance assessment: Needs assistance Sitting-balance support: Feet supported;No upper extremity supported Sitting balance-Leahy Scale: Normal     Standing balance support: Bilateral upper extremity supported;During functional activity Standing balance-Leahy Scale: Fair                              Cognition Arousal/Alertness: Awake/alert Behavior During Therapy: WFL for tasks assessed/performed Overall Cognitive Status: Within Functional Limits for tasks assessed                                        Exercises Other Exercises Other Exercises: Attempted bridging, however, too painful  this session. Did place bed in chair position and placed towel roll for lumbar support.     General Comments General comments (skin integrity, edema, etc.): Educated about taking multiple short walks throughout the day to help with SI joint pain.       Pertinent Vitals/Pain Pain Assessment: Faces Faces Pain Scale: Hurts even more Pain Location: R SIJ with  movement Pain Descriptors / Indicators: Grimacing;Guarding;Discomfort;Sore Pain Intervention(s): Monitored during session;Limited activity within patient's tolerance;Repositioned    Home Living                      Prior Function            PT Goals (current goals can now be found in the care plan section) Acute Rehab PT Goals Patient Stated Goal: to get home PT Goal Formulation: With patient Time For Goal Achievement: 08/27/19 Potential to Achieve Goals: Good Progress towards PT goals: Progressing toward goals    Frequency    Min 3X/week      PT Plan Current plan remains appropriate    Co-evaluation              AM-PAC PT "6 Clicks" Mobility   Outcome Measure  Help needed turning from your back to your side while in a flat bed without using bedrails?: None Help needed moving from lying on your back to sitting on the side of a flat bed without using bedrails?: A Little Help needed moving to and from a bed to a chair (including a wheelchair)?: A Little Help needed standing up from a chair using your arms (e.g., wheelchair or bedside chair)?: A Little Help needed to walk in hospital room?: A Little Help needed climbing 3-5 steps with a railing? : A Little 6 Click Score: 19    End of Session Equipment Utilized During Treatment: Gait belt Activity Tolerance: Patient tolerated treatment well Patient left: in bed;with call bell/phone within reach;with family/visitor present(in chair position) Nurse Communication: Mobility status PT Visit Diagnosis: Muscle weakness (generalized) (M62.81);Pain;Difficulty in walking, not elsewhere classified (R26.2) Pain - Right/Left: Right Pain - part of body: (SI joint)     Time: 4818-5631 PT Time Calculation (min) (ACUTE ONLY): 37 min  Charges:  $Gait Training: 23-37 mins                     Gladys Damme, PT, DPT  Acute Rehabilitation Services  Pager: [phone] Office: [phone]    [facility] 08/29/2019, 5:23 PM

## 2019-08-30 LAB — GLUCOSE, CAPILLARY
Glucose-Capillary: 111 mg/dL — ABNORMAL HIGH (ref 70–99)
Glucose-Capillary: 126 mg/dL — ABNORMAL HIGH (ref 70–99)
Glucose-Capillary: 127 mg/dL — ABNORMAL HIGH (ref 70–99)
Glucose-Capillary: 163 mg/dL — ABNORMAL HIGH (ref 70–99)

## 2019-08-30 NOTE — Progress Notes (Addendum)
PROGRESS NOTE    William [name]  SNK:[id number] DOB: 1981/07/18 DOA: 07/31/2019 PCP: Patient, No Pcp Per   Brief Narrative:  Patient is a 38 year old male with history of IV drug abuse who was initially admitted with multiple complaints including lower back pain, left shoulder pain, right foot/heel pain.  Imaging done on presentation showed  right heel abscess, left upper chest wall abscess and mass.  CT scan showed left anterior chest wall soft tissue and intramuscular infection with extension into thoracic cavity, air in the soft tissue of the right axillary region to the right shoulder, multiple long nodules.  Patient was initially admitted to ICU.  He underwent surgical debridement of the chest wall abscess and wound VAC placement bu CTCS,wound vac  removed on 9/25.  He was also found to have new onset diabetes here.  MRI had shown multiple epidural and paraspinal abscesses.  Blood cultures/wound cultures showed MSSA.  ID was initially following.  Started on nafcillin via midline.  Last day of antibiotics is 10/28 as per ID who recommended 8 weeks of antibiotics starting from 07/31/2019.  Anticipate inpatient hospitalization until he completes antibiotic therapy.  Assessment & Plan:   Principal Problem:   Staphylococcus aureus bacteremia Active Problems:   Sepsis (HCC)   Abscess of paraspinous muscles   Osteomyelitis of lumbar spine (HCC)   Epidural abscess of spine due to infective embolism   Abnormal chest CT   Septic embolism (HCC)   Community acquired pneumonia   IV drug abuse (HCC)   Heroin abuse (HCC)   Elevated troponin   UTI (urinary tract infection)   Renal failure   Hyperglycemia   Suspected endocarditis   Chest wall abscess   Abscess of right heel   Acute osteomyelitis of lumbar spine (HCC)   Sepsis secondary to MSSA bacteremia/chest wall abscess/septic pulmonary emboli secondary to IV drug abuse (heroin): Sepsis resolved.  Chest wound dressing changed today.  Presented  with multiple abscesses as above.  Underwent incision and drainage and debridement of left chest wall with wound VAC and is being followed by CTCS.  Status post bedside I&D right heel.  Wound culture, blood cultures showed MSSA.  Also had multiple epidural abscess/discitis was seen by neurosurgery, no recommendation for surgery.  Intraoperative TEE did not show any evidence of endocarditis.   Currently on nafcillin which will be continued through 10/28 total of 8 weeks starting from 07/31/2019  Abdominal discomfort/generalized body ache: Improved.  Continue muscle relaxants/NSAIDs.  Right shoulder septic bursitis:   CT (9/10) showed septic bursitis without evidence of  Osteomyelitis/septic arthitis.  Orthopedics was following.  Status post arthrocentesis which were suggestive of septic bursitis.  Plan is to continue IV antibiotics.  Following physical therapy  New onset diabetes mellitus: Hemoglobin A1c of 7.8.  Continue current regimen.  Anti-hyperglycemics can be changed to oral on discharge.   Hypokalemia: Resolved on last labs on 9/24.  Repeat labs tomorrow  Normocytic normochromic anemia: Currently H&H is stable.  Follow-up in a.m.  IV drug abuse: Counseled for cessation.  Patient is motivated to quit.  We will consider communicating with IM teaching service  when he is closer to the discharge so that he can follow-up as an outpatient internal medicine  service/addiction clinic  Nutrition Problem: Increased nutrient needs Etiology: wound healing      DVT prophylaxis:Lovenox Code Status: Full Family Communication: None present at the bedside Disposition Plan: Home after completion of antibiotic therapy   Consultants: PCCM, see TCS, neurosurgery, ID  Procedures: Chest wall abscess incision/drainage right heel incision/drainage  Antimicrobials:  Anti-infectives (From admission, onward)   Start     Dose/Rate Route Frequency Ordered Stop   08/10/19 0936  vancomycin (VANCOCIN) 1,000  mg in sodium chloride 0.9 % 1,000 mL irrigation  Status:  Discontinued       As needed 08/10/19 0937 08/10/19 1121   08/10/19 0600  ceFAZolin (ANCEF) IVPB 2g/100 mL premix     2 g 200 mL/hr over 30 Minutes Intravenous To Short Stay 08/10/19 0036 08/10/19 0625   08/09/19 1730  ceFAZolin (ANCEF) IVPB 2g/100 mL premix  Status:  Discontinued     2 g 200 mL/hr over 30 Minutes Intravenous 30 min pre-op 08/09/19 1730 08/10/19 0036   08/06/19 1430  nafcillin injection 2 g  Status:  Discontinued     2 g Intravenous Every 4 hours 08/06/19 1310 08/06/19 1359   08/06/19 1430  nafcillin 2 g in sodium chloride 0.9 % 100 mL IVPB     2 g 200 mL/hr over 30 Minutes Intravenous Every 4 hours 08/06/19 1359     08/03/19 0900  fluconazole (DIFLUCAN) tablet 200 mg     200 mg Oral  Once 08/03/19 0842 08/03/19 0917   08/02/19 2200  ceFAZolin (ANCEF) IVPB 2g/100 mL premix  Status:  Discontinued     2 g 200 mL/hr over 30 Minutes Intravenous Every 8 hours 08/02/19 1534 08/06/19 1310   08/02/19 1600  metroNIDAZOLE (FLAGYL) tablet 500 mg  Status:  Discontinued     500 mg Oral Every 8 hours 08/02/19 1534 08/06/19 1454   08/01/19 1200  vancomycin (VANCOCIN) IVPB 750 mg/150 ml premix  Status:  Discontinued     750 mg 150 mL/hr over 60 Minutes Intravenous Every 8 hours 08/01/19 0224 08/02/19 1533   08/01/19 1130  ceFAZolin (ANCEF) IVPB 2g/100 mL premix     2 g 200 mL/hr over 30 Minutes Intravenous 30 min pre-op 08/01/19 0828 08/01/19 1319   08/01/19 1000  ceFEPIme (MAXIPIME) 2 g in sodium chloride 0.9 % 100 mL IVPB  Status:  Discontinued     2 g 200 mL/hr over 30 Minutes Intravenous Every 8 hours 08/01/19 0224 08/02/19 1533   08/01/19 0100  ceFEPIme (MAXIPIME) 2 g in sodium chloride 0.9 % 100 mL IVPB     2 g 200 mL/hr over 30 Minutes Intravenous  Once 08/01/19 0056 08/01/19 0335   08/01/19 0100  metroNIDAZOLE (FLAGYL) IVPB 500 mg     500 mg 100 mL/hr over 60 Minutes Intravenous  Once 08/01/19 0056 08/01/19 0335    08/01/19 0100  vancomycin (VANCOCIN) IVPB 1000 mg/200 mL premix     1,000 mg 200 mL/hr over 60 Minutes Intravenous  Once 08/01/19 0056 08/01/19 0335      Subjective:  Patient seen and examined the bedside this morning.  Busy with his computer at bedside.  Comfortable.  Denies any complaints.  Slept well  Objective: Vitals:   08/29/19 1248 08/29/19 1627 08/29/19 2306 08/30/19 0907  BP: 128/87 122/78 (!) 151/98 (!) 138/92  Pulse: 81 84 83 86  Resp: 18 18 18 20   Temp: 98.8 F (37.1 C) 98.7 F (37.1 C) 97.7 F (36.5 C) 97.8 F (36.6 C)  TempSrc: Oral Oral Oral Oral  SpO2: 100% 100% 100% 100%  Weight:      Height:        Intake/Output Summary (Last 24 hours) at 08/30/2019 1210 Last data filed at 08/30/2019 0910 Gross per 24 hour  Intake  1636.44 ml  Output 1225 ml  Net 411.44 ml   Filed Weights   08/15/19 0500 08/26/19 0108 08/27/19 0430  Weight: 77.1 kg 78.8 kg 78.5 kg    Examination:   General exam: Appears calm and comfortable ,Not in distress,average built HEENT:Oral mucosa moist, Ear/Nose normal on gross exam Respiratory system: Bilateral equal air entry, normal vesicular breath sounds, no wheezes or crackles.I and D wound on the chest, packed with dressing, clean ,no signs of infection Cardiovascular system: S1 & S2 heard, RRR. No JVD, murmurs, rubs, gallops or clicks. Gastrointestinal system: Abdomen is nondistended, soft and nontender. No organomegaly or masses felt. Normal bowel sounds heard. Central nervous system: Alert and oriented. No focal neurological deficits. Extremities: No edema, no clubbing ,no cyanosis, distal peripheral pulses palpable.  Right shoulder with limited active and passive motion.  No erythema or warmth or tenderness to palpation Skin: tattos,No rashes, lesions or ulcers,no icterus ,no pallor     Data Reviewed: I have personally reviewed following labs and imaging studies  CBC: No results for input(s): WBC, NEUTROABS, HGB, HCT, MCV,  PLT in the last 168 hours. Basic Metabolic Panel: No results for input(s): NA, K, CL, CO2, GLUCOSE, BUN, CREATININE, CALCIUM, MG, PHOS in the last 168 hours. GFR: Estimated Creatinine Clearance: 139 mL/min (by C-G formula based on SCr of 0.75 mg/dL). Liver Function Tests: No results for input(s): AST, ALT, ALKPHOS, BILITOT, PROT, ALBUMIN in the last 168 hours. No results for input(s): LIPASE, AMYLASE in the last 168 hours. No results for input(s): AMMONIA in the last 168 hours. Coagulation Profile: No results for input(s): INR, PROTIME in the last 168 hours. Cardiac Enzymes: No results for input(s): CKTOTAL, CKMB, CKMBINDEX, TROPONINI in the last 168 hours. BNP (last 3 results) No results for input(s): PROBNP in the last 8760 hours. HbA1C: No results for input(s): HGBA1C in the last 72 hours. CBG: Recent Labs  Lab 08/29/19 0601 08/29/19 1244 08/29/19 1624 08/29/19 2102 08/30/19 0615  GLUCAP 91 123* 159* 115* 111*   Lipid Profile: No results for input(s): CHOL, HDL, LDLCALC, TRIG, CHOLHDL, LDLDIRECT in the last 72 hours. Thyroid Function Tests: No results for input(s): TSH, T4TOTAL, FREET4, T3FREE, THYROIDAB in the last 72 hours. Anemia Panel: No results for input(s): VITAMINB12, FOLATE, FERRITIN, TIBC, IRON, RETICCTPCT in the last 72 hours. Sepsis Labs: No results for input(s): PROCALCITON, LATICACIDVEN in the last 168 hours.  No results found for this or any previous visit (from the past 240 hour(s)).       Radiology Studies: No results found.      Scheduled Meds: . Chlorhexidine Gluconate Cloth  6 each Topical Daily  . docusate sodium  100 mg Oral Daily  . insulin aspart  0-15 Units Subcutaneous TID WC  . insulin aspart  0-5 Units Subcutaneous QHS  . magnesium oxide  400 mg Oral BID  . Melatonin  6 mg Oral Once  . metoprolol tartrate  12.5 mg Oral BID  . ondansetron  4 mg Intravenous Once  . Ensure Max Protein  11 oz Oral BID  . sodium chloride flush   10-40 mL Intracatheter Q12H   Continuous Infusions: . sodium chloride 1,000 mL (08/24/19 2247)  . lactated ringers 10 mL/hr at 08/10/19 0845  . nafcillin IV 2 g (08/30/19 1129)     LOS: 29 days    Time spent:35 mins. More than 50% of that time was spent in counseling and/or coordination of care.      Jae Dire, MD  [facility] Pager [phone]  If 7PM-7AM, please contact night-coverage [email or web address] Password [email or web address] 08/30/2019, 12:10 PM

## 2019-08-30 NOTE — Progress Notes (Signed)
20 Days Post-Op Procedure(s) (LRB): WOUND DEBRIDEMENT (N/A) WOUND VAC CHANGE (N/A) Subjective: Awake and alert. No new concerns related to the left chest wound.   Objective: Vital signs in last 24 hours: Temp:  [97.7 F (36.5 C)-98.8 F (37.1 C)] 97.7 F (36.5 C) (09/30 2306) Pulse Rate:  [81-97] 83 (09/30 2306) Resp:  [18] 18 (09/30 2306) BP: (122-151)/(78-98) 151/98 (09/30 2306) SpO2:  [97 %-100 %] 100 % (09/30 2306)     Intake/Output from previous day: 09/30 0701 - 10/01 0700 In: 1636.4 [P.O.:600; I.V.:236.4; IV Piggyback:800] Out: 900 [Urine:900] Intake/Output this shift: No intake/output data recorded.  General appearance: alert, cooperative and no distress Wound: The left chest wound dressing was removed. The wound is clean and granulating. Minimal drainage and no eschar. A new saline moistend dressing was placed.    Lab Results: No results for input(s): WBC, HGB, HCT, PLT in the last 72 hours. BMET: No results for input(s): NA, K, CL, CO2, GLUCOSE, BUN, CREATININE, CALCIUM in the last 72 hours.  PT/INR: No results for input(s): LABPROT, INR in the last 72 hours. ABG    Component Value Date/Time   PHART 7.482 (H) 08/01/2019 0909   HCO3 31.0 (H) 08/01/2019 0909   TCO2 32 08/01/2019 0909   O2SAT 97.0 08/01/2019 0909   CBG (last 3)  Recent Labs    08/29/19 1624 08/29/19 2102 08/30/19 0615  GLUCAP 159* 115* 111*    Assessment/Plan: S/P Procedure(s) (LRB): WOUND DEBRIDEMENT (N/A) WOUND VAC CHANGE (N/A)  -POD-28incision and drainage of the left [place] joint abscess and subsequent wound vac placement and POD-21excisional debridement in the OR.  The wound vac was discontinued on 9/29 and saline moist to dry dressings have been changed daily. Today, the wound appearsclean andis continuing to granulate appropriately.There is no significant drainage. Continue saline moist to dry dressings daily. Will continue to follow.     LOS: 29 days    Antony Odea, [place] [phone] 08/30/2019

## 2019-08-30 NOTE — Progress Notes (Signed)
Physical Therapy Treatment Patient Details Name: William [name] MRN: [id number] DOB: [birth year] Today's Date: 08/30/2019    History of Present Illness Patient is a 38 y/o male who presents with lower back pain, left shoulder pain, right foot and heel pain. Found to have right heel abscess s/p I&D 9/4, left upper chest wall abscess and mass s/p I&D and wound vac application 9/3, new onset DM, sepsis with MSSA bacteremia due to IV drug use. CT scan- left anterior chest wall soft tissue and intramuscular infection with extension into the thoracic cavity, air in the soft tissues of the right axilla adjacent to the right shoulder, multiple small pulmonary nodules. PMH- heroin IV drug use.    PT Comments    Patient received in bed, very pleasant and motivated to work with therapy. Continues to be able to perform all bed mobility with S and extended time due to pain. Attempted gait with no device today but unable due to pain, so continued using RW this session. Continues to tolerate progression of gait distance and able to gait train approximately 930f today with RW, steady at self-selected pace. Introduced stairs, able to navigate six inch and four inch steps with B railings and min guard, extended time due to pain today. Reviewed existing HEP, also added frog stretch and glute stretches as well as increased frequency of HEP moving forward. He was left in bed with all needs met, family present this morning.     Follow Up Recommendations  Home health PT;Supervision for mobility/OOB     Equipment Recommendations  Rolling walker with 5" wheels    Recommendations for Other Services       Precautions / Restrictions Precautions Precautions: Fall Precaution Comments: weakness, chest wound vac pulled 08/24/19, probable SI joint dysfunction Restrictions Weight Bearing Restrictions: No    Mobility  Bed Mobility Overal bed mobility: Needs Assistance Bed Mobility: Rolling;Sidelying to Sit;Sit to  Supine Rolling: Supervision Sidelying to sit: Supervision   Sit to supine: Supervision;HOB elevated   General bed mobility comments: all bed mobility with increased time and S, no physical assist given  Transfers Overall transfer level: Needs assistance Equipment used: Straight cane Transfers: Sit to/from Stand Sit to Stand: From elevated surface;Min guard         General transfer comment: Increased time to come to standing secondary to pain. No physical assist required.   Ambulation/Gait Ambulation/Gait assistance: Supervision Gait Distance (Feet): 900 Feet Assistive device: Rolling walker (2 wheeled) Gait Pattern/deviations: Step-through pattern;Decreased stride length Gait velocity: decreased   General Gait Details: attempted gait with no device today, patient unable due to pain in back so continued using RW   Stairs Stairs: Yes Stairs assistance: Min guard Stair Management: Two rails;Step to pattern;Forwards Number of Stairs: 5 General stair comments: 2 six inch steps, 3 four inch steps B railings, min guard, extended time   Wheelchair Mobility    Modified Rankin (Stroke Patients Only)       Balance Overall balance assessment: Needs assistance Sitting-balance support: Feet supported;No upper extremity supported Sitting balance-Leahy Scale: Normal     Standing balance support: Bilateral upper extremity supported;During functional activity Standing balance-Leahy Scale: Fair Standing balance comment: RW for gait, but able to turn in room without UE support; RW mostly for pain control when transitioning                            Cognition Arousal/Alertness: Awake/alert Behavior During Therapy: WHemet Endoscopyfor tasks  assessed/performed Overall Cognitive Status: Within Functional Limits for tasks assessed                                        Exercises      General Comments General comments (skin integrity, edema, etc.): education  regarding importance of mobility in reducing inflammatory chemicals/biomarkers, HEP updates      Pertinent Vitals/Pain Pain Assessment: Faces Faces Pain Scale: Hurts even more Pain Location: R SIJ with movement Pain Descriptors / Indicators: Grimacing;Guarding;Discomfort;Sore Pain Intervention(s): Limited activity within patient's tolerance;Monitored during session;Repositioned    Home Living                      Prior Function            PT Goals (current goals can now be found in the care plan section) Acute Rehab PT Goals Patient Stated Goal: to get home PT Goal Formulation: With patient Time For Goal Achievement: 08/27/19 Potential to Achieve Goals: Good Progress towards PT goals: Progressing toward goals    Frequency    Min 3X/week      PT Plan Current plan remains appropriate    Co-evaluation              AM-PAC PT "6 Clicks" Mobility   Outcome Measure  Help needed turning from your back to your side while in a flat bed without using bedrails?: None Help needed moving from lying on your back to sitting on the side of a flat bed without using bedrails?: A Little Help needed moving to and from a bed to a chair (including a wheelchair)?: A Little Help needed standing up from a chair using your arms (e.g., wheelchair or bedside chair)?: A Little Help needed to walk in hospital room?: A Little Help needed climbing 3-5 steps with a railing? : A Little 6 Click Score: 19    End of Session Equipment Utilized During Treatment: Gait belt Activity Tolerance: Patient tolerated treatment well Patient left: in bed;with call bell/phone within reach;with family/visitor present   PT Visit Diagnosis: Muscle weakness (generalized) (M62.81);Pain;Difficulty in walking, not elsewhere classified (R26.2) Pain - Right/Left: Right Pain - part of body: (SI joint)     Time: 1049-1118 PT Time Calculation (min) (ACUTE ONLY): 29 min  Charges:  $Gait Training: 8-22  mins $Therapeutic Exercise: 8-22 mins                     Kristen Unger PT, DPT, CBIS  Supplemental Physical Therapist [place]    Pager [phone] Acute Rehab Office [phone]    

## 2019-08-31 LAB — COMPREHENSIVE METABOLIC PANEL
ALT: 12 U/L (ref 0–44)
AST: 15 U/L (ref 15–41)
Albumin: 2 g/dL — ABNORMAL LOW (ref 3.5–5.0)
Alkaline Phosphatase: 90 U/L (ref 38–126)
Anion gap: 12 (ref 5–15)
BUN: 14 mg/dL (ref 6–20)
CO2: 25 mmol/L (ref 22–32)
Calcium: 8.7 mg/dL — ABNORMAL LOW (ref 8.9–10.3)
Chloride: 104 mmol/L (ref 98–111)
Creatinine, Ser: 0.76 mg/dL (ref 0.61–1.24)
GFR calc Af Amer: >60 mL/min (ref >60)
GFR calc non Af Amer: >60 mL/min (ref >60)
Glucose, Bld: 115 mg/dL — ABNORMAL HIGH (ref 70–99)
Potassium: 3.7 mmol/L (ref 3.5–5.1)
Sodium: 141 mmol/L (ref 135–145)
Total Bilirubin: 1.2 mg/dL (ref 0.3–1.2)
Total Protein: 6.5 g/dL (ref 6.5–8.1)

## 2019-08-31 LAB — GLUCOSE, CAPILLARY
Glucose-Capillary: 109 mg/dL — ABNORMAL HIGH (ref 70–99)
Glucose-Capillary: 114 mg/dL — ABNORMAL HIGH (ref 70–99)
Glucose-Capillary: 103 mg/dL — ABNORMAL HIGH (ref 70–99)
Glucose-Capillary: 140 mg/dL — ABNORMAL HIGH (ref 70–99)

## 2019-08-31 LAB — CBC WITH DIFFERENTIAL/PLATELET
Abs Immature Granulocytes: 0.02 10*3/uL (ref 0.00–0.07)
Basophils Absolute: 0 10*3/uL (ref 0.0–0.1)
Basophils Relative: 0 %
Eosinophils Absolute: 0.1 10*3/uL (ref 0.0–0.5)
Eosinophils Relative: 2 %
HCT: 31.4 % — ABNORMAL LOW (ref 39.0–52.0)
Hemoglobin: 10 g/dL — ABNORMAL LOW (ref 13.0–17.0)
Immature Granulocytes: 0 %
Lymphocytes Relative: 16 %
Lymphs Abs: 0.7 10*3/uL (ref 0.7–4.0)
MCH: 30.2 pg (ref 26.0–34.0)
MCHC: 31.8 g/dL (ref 30.0–36.0)
MCV: 94.9 fL (ref 80.0–100.0)
Monocytes Absolute: 0.3 10*3/uL (ref 0.1–1.0)
Monocytes Relative: 6 %
Neutro Abs: 3.6 10*3/uL (ref 1.7–7.7)
Neutrophils Relative %: 76 %
Platelets: 445 10*3/uL — ABNORMAL HIGH (ref 150–400)
RBC: 3.31 MIL/uL — ABNORMAL LOW (ref 4.22–5.81)
RDW: 15.8 % — ABNORMAL HIGH (ref 11.5–15.5)
WBC: 4.7 10*3/uL (ref 4.0–10.5)
nRBC: 0 % (ref 0.0–0.2)

## 2019-08-31 NOTE — Progress Notes (Signed)
Physical Therapy Treatment Patient Details Name: William [name] MRN: [id number] DOB: 05/05/1981 Today's Date: 08/31/2019    History of Present Illness Patient is a 38 y/o male who presents with lower back pain, left shoulder pain, right foot and heel pain. Found to have right heel abscess s/p I&D 9/4, left upper chest wall abscess and mass s/p I&D and wound vac application 9/3, new onset DM, sepsis with MSSA bacteremia due to IV drug use. CT scan- left anterior chest wall soft tissue and intramuscular infection with extension into the thoracic cavity, air in the soft tissues of the right axilla adjacent to the right shoulder, multiple small pulmonary nodules. PMH- heroin IV drug use.    PT Comments    Patient received in bed, very motivated to participate in therapy and reporting no pain today (!!!). Able to perform all bed mobility with distant S, continues to require B UE support (one hand on cane, one HHA from PT today) for sit to stand, but able to perform this transition motion with less pain and much more smoothly today. Continued gait training with RW, also progressed to gait training with IV pole in hallway with Min guard for safety and balance when using the IV pole. Returned to his room and reviewed exercises/provided HEP handout and also discussed frequency and importance of consistent activity/exercise performance throughout the day to assist in keeping SIJ pain at [place]. He was left in bed with family present, all needs otherwise met this morning.     Follow Up Recommendations  Home health PT;Supervision for mobility/OOB     Equipment Recommendations  Rolling walker with 5" wheels    Recommendations for Other Services       Precautions / Restrictions Precautions Precautions: Fall Precaution Comments: weakness, chest wound vac pulled 08/24/19, probable SI joint dysfunction Restrictions Weight Bearing Restrictions: No    Mobility  Bed Mobility Overal bed mobility: Needs  Assistance Bed Mobility: Rolling;Sidelying to Sit;Sit to Sidelying Rolling: Supervision Sidelying to sit: Supervision     Sit to sidelying: Supervision General bed mobility comments: distant S for safety, no physical assist given  Transfers Overall transfer level: Needs assistance Equipment used: Straight cane;1 person hand held assist Transfers: Sit to/from Stand Sit to Stand: From elevated surface;Supervision         General transfer comment: increased time to come to standing, B HHA support but improved speed of motion  Ambulation/Gait Ambulation/Gait assistance: Supervision;Min guard Gait Distance (Feet): 1000 Feet Assistive device: Rolling walker (2 wheeled);IV Pole Gait Pattern/deviations: Step-through pattern;Decreased stride length Gait velocity: decreased   General Gait Details: gait 562f with RW, then an additional 5049fwith IV pole and min gaurd for safety   Stairs             Wheelchair Mobility    Modified Rankin (Stroke Patients Only)       Balance Overall balance assessment: Needs assistance Sitting-balance support: Feet supported;No upper extremity supported Sitting balance-Leahy Scale: Normal     Standing balance support: Bilateral upper extremity supported;During functional activity Standing balance-Leahy Scale: Fair Standing balance comment: min guard for gait with IV pole today, tends to require more support when pain is higher                            Cognition Arousal/Alertness: Awake/alert Behavior During Therapy: WFL for tasks assessed/performed Overall Cognitive Status: Within Functional Limits for tasks assessed  Exercises      General Comments        Pertinent Vitals/Pain Pain Assessment: No/denies pain Pain Score: 0-No pain Pain Intervention(s): Limited activity within patient's tolerance;Monitored during session    Home Living                       Prior Function            PT Goals (current goals can now be found in the care plan section) Acute Rehab PT Goals Patient Stated Goal: to get home PT Goal Formulation: With patient Time For Goal Achievement: 08/27/19 Potential to Achieve Goals: Good Progress towards PT goals: Progressing toward goals    Frequency    Min 3X/week      PT Plan Current plan remains appropriate    Co-evaluation              AM-PAC PT "6 Clicks" Mobility   Outcome Measure  Help needed turning from your back to your side while in a flat bed without using bedrails?: None Help needed moving from lying on your back to sitting on the side of a flat bed without using bedrails?: None Help needed moving to and from a bed to a chair (including a wheelchair)?: A Little Help needed standing up from a chair using your arms (e.g., wheelchair or bedside chair)?: A Little Help needed to walk in hospital room?: A Little Help needed climbing 3-5 steps with a railing? : A Little 6 Click Score: 20    End of Session   Activity Tolerance: Patient tolerated treatment well Patient left: in bed;with call bell/phone within reach;with family/visitor present   PT Visit Diagnosis: Muscle weakness (generalized) (M62.81);Pain;Difficulty in walking, not elsewhere classified (R26.2) Pain - Right/Left: Right Pain - part of body: (SI joint)     Time: 1100-1131 PT Time Calculation (min) (ACUTE ONLY): 31 min  Charges:  $Gait Training: 8-22 mins $Self Care/Home Management: 8-22                     Deniece Ree PT, DPT, CBIS  Supplemental Physical Therapist [place]    Pager [phone] Acute Rehab Office [phone]

## 2019-08-31 NOTE — Progress Notes (Addendum)
PROGRESS NOTE    William [name]  WUJ:[id number] DOB: 1980/12/05 DOA: 07/31/2019 PCP: Patient, No Pcp Per   Brief Narrative:  This is a very pleasant 38 year old male with history of IV drug abuse who was initially admitted with multiple complaints including lower back pain, left shoulder pain, right foot/heel pain.  Imaging done on presentation showed  right heel abscess, left upper chest wall abscess and mass.  CT scan showed left anterior chest wall soft tissue and intramuscular infection with extension into thoracic cavity, air in the soft tissue of the right axillary region to the right shoulder, multiple long nodules.  Patient was initially admitted to ICU.  He underwent surgical debridement of the chest wall abscess and wound VAC placement bu CTCS,wound vac  removed on 9/25.  He was also found to have new onset diabetes here.  MRI had shown multiple epidural and paraspinal abscesses.  Blood cultures/wound cultures showed MSSA.  ID was initially following.  Started on nafcillin via midline.  Last day of antibiotics is 10/28 as per ID who recommended 8 weeks of antibiotics starting from 07/31/2019.  Anticipate inpatient hospitalization until he completes antibiotic therapy.  Assessment & Plan:   Principal Problem:   Staphylococcus aureus bacteremia Active Problems:   Sepsis (HCC)   Abscess of paraspinous muscles   Osteomyelitis of lumbar spine (HCC)   Epidural abscess of spine due to infective embolism   Abnormal chest CT   Septic embolism (HCC)   Community acquired pneumonia   IV drug abuse (HCC)   Heroin abuse (HCC)   Elevated troponin   UTI (urinary tract infection)   Renal failure   Hyperglycemia   Suspected endocarditis   Chest wall abscess   Abscess of right heel   Acute osteomyelitis of lumbar spine (HCC)   Sepsis secondary to MSSA bacteremia/chest wall abscess/septic pulmonary emboli secondary to IV drug abuse (heroin): Sepsis resolved.   Presented with multiple abscesses  as above.  Underwent incision and drainage and debridement of left chest wall with wound VAC and is being followed by CTCS. Wound vac changed yesterday. Status post bedside I&D right heel.  Wound culture, blood cultures showed MSSA.  Also had multiple epidural abscess/discitis was seen by neurosurgery, no recommendation for surgery.  Intraoperative TEE did not show any evidence of endocarditis.   Currently on nafcillin which will be continued through 10/28 total of 8 weeks starting from 07/31/2019  Abdominal discomfort/generalized body ache: resolved.  Continue muscle relaxants/NSAIDs.  Right shoulder septic bursitis with limited right shoulder ROM:   CT (9/10) showed septic bursitis without evidence of  Osteomyelitis/septic arthitis.  Orthopedics was following.  Status post arthrocentesis which were suggestive of septic bursitis.  Plan is to continue IV antibiotics.  Following physical therapy with some improvement in ROM. Continue PT  New onset diabetes mellitus: Hemoglobin A1c of 7.8.  Continue current regimen.  Anti-hyperglycemics can be changed to oral on discharge.   Hypokalemia: Resolved   Normocytic normochromic anemia: follow up CBC  IV drug abuse: Counseled for cessation.  Patient is motivated to quit.  We will consider communicating with IM teaching service  when he is closer to the discharge so that he can follow-up as an outpatient internal medicine  service/addiction clinic  Nutrition Problem: Increased nutrient needs Etiology: wound healing      DVT prophylaxis:Lovenox Code Status: Full Family Communication: Discussed with mother at bedside Disposition Plan: Home after completion of antibiotic therapy   Consultants: PCCM, see TCS, neurosurgery, ID  Procedures:  Chest wall abscess incision/drainage right heel incision/drainage  Antimicrobials:  Anti-infectives (From admission, onward)   Start     Dose/Rate Route Frequency Ordered Stop   08/10/19 0936  vancomycin  (VANCOCIN) 1,000 mg in sodium chloride 0.9 % 1,000 mL irrigation  Status:  Discontinued       As needed 08/10/19 0937 08/10/19 1121   08/10/19 0600  ceFAZolin (ANCEF) IVPB 2g/100 mL premix     2 g 200 mL/hr over 30 Minutes Intravenous To Short Stay 08/10/19 0036 08/10/19 0625   08/09/19 1730  ceFAZolin (ANCEF) IVPB 2g/100 mL premix  Status:  Discontinued     2 g 200 mL/hr over 30 Minutes Intravenous 30 min pre-op 08/09/19 1730 08/10/19 0036   08/06/19 1430  nafcillin injection 2 g  Status:  Discontinued     2 g Intravenous Every 4 hours 08/06/19 1310 08/06/19 1359   08/06/19 1430  nafcillin 2 g in sodium chloride 0.9 % 100 mL IVPB     2 g 200 mL/hr over 30 Minutes Intravenous Every 4 hours 08/06/19 1359     08/03/19 0900  fluconazole (DIFLUCAN) tablet 200 mg     200 mg Oral  Once 08/03/19 0842 08/03/19 0917   08/02/19 2200  ceFAZolin (ANCEF) IVPB 2g/100 mL premix  Status:  Discontinued     2 g 200 mL/hr over 30 Minutes Intravenous Every 8 hours 08/02/19 1534 08/06/19 1310   08/02/19 1600  metroNIDAZOLE (FLAGYL) tablet 500 mg  Status:  Discontinued     500 mg Oral Every 8 hours 08/02/19 1534 08/06/19 1454   08/01/19 1200  vancomycin (VANCOCIN) IVPB 750 mg/150 ml premix  Status:  Discontinued     750 mg 150 mL/hr over 60 Minutes Intravenous Every 8 hours 08/01/19 0224 08/02/19 1533   08/01/19 1130  ceFAZolin (ANCEF) IVPB 2g/100 mL premix     2 g 200 mL/hr over 30 Minutes Intravenous 30 min pre-op 08/01/19 0828 08/01/19 1319   08/01/19 1000  ceFEPIme (MAXIPIME) 2 g in sodium chloride 0.9 % 100 mL IVPB  Status:  Discontinued     2 g 200 mL/hr over 30 Minutes Intravenous Every 8 hours 08/01/19 0224 08/02/19 1533   08/01/19 0100  ceFEPIme (MAXIPIME) 2 g in sodium chloride 0.9 % 100 mL IVPB     2 g 200 mL/hr over 30 Minutes Intravenous  Once 08/01/19 0056 08/01/19 0335   08/01/19 0100  metroNIDAZOLE (FLAGYL) IVPB 500 mg     500 mg 100 mL/hr over 60 Minutes Intravenous  Once 08/01/19 0056  08/01/19 0335   08/01/19 0100  vancomycin (VANCOCIN) IVPB 1000 mg/200 mL premix     1,000 mg 200 mL/hr over 60 Minutes Intravenous  Once 08/01/19 0056 08/01/19 0335      Subjective:  Patient seen and examined at bedside with mother present in the room. States that he is having some tightness in his back when he ambulates with PT or moves to the chair to sit. Denies pain.  Also states that he is still having some difficulty with his range of motion of the right shoulder however reports it is improving with physical therapy.  Denies any other acute complaints.  Acute events overnight.  Objective: Vitals:   08/30/19 2017 08/30/19 2328 08/31/19 0623 08/31/19 0918  BP: (!) 149/83 (!) 127/91 (!) 130/100 (!) 140/92  Pulse: 77 71 68 83  Resp: 19 17 18 20   Temp: 97.9 F (36.6 C) 97.6 F (36.4 C) 98.5 F (36.9 C) 98.4  F (36.9 C)  TempSrc: Oral Oral Oral Oral  SpO2: 99% 100% 100% 100%  Weight:      Height:        Intake/Output Summary (Last 24 hours) at 08/31/2019 1155 Last data filed at 08/31/2019 0955 Gross per 24 hour  Intake 770 ml  Output 600 ml  Net 170 ml   Filed Weights   08/15/19 0500 08/26/19 0108 08/27/19 0430  Weight: 77.1 kg 78.8 kg 78.5 kg    Examination:   Physical Exam Vitals signs and nursing note reviewed.  Constitutional:      Appearance: Normal appearance.  HENT:     Head: Normocephalic and atraumatic.     Nose: Nose normal.     Mouth/Throat:     Mouth: Mucous membranes are moist.  Eyes:     Extraocular Movements: Extraocular movements intact.     Conjunctiva/sclera: Conjunctivae normal.  Neck:     Musculoskeletal: Normal range of motion. No neck rigidity.  Cardiovascular:     Rate and Rhythm: Normal rate and regular rhythm.  Pulmonary:     Effort: Pulmonary effort is normal.     Breath sounds: Normal breath sounds.  Abdominal:     General: Abdomen is flat.     Palpations: Abdomen is soft.  Musculoskeletal:     Comments: Decreased range of  motion with flexion and abduction of right shoulder limited to 90 degrees.  No erythema or swelling of shoulder no neurologic changes of right upper extremity  Neurological:     Mental Status: He is alert.  Psychiatric:        Mood and Affect: Mood normal.        Behavior: Behavior normal.         Data Reviewed: I have personally reviewed following labs and imaging studies  CBC: No results for input(s): WBC, NEUTROABS, HGB, HCT, MCV, PLT in the last 168 hours. Basic Metabolic Panel: Recent Labs  Lab 08/31/19 0748  NA 141  K 3.7  CL 104  CO2 25  GLUCOSE 115*  BUN 14  CREATININE 0.76  CALCIUM 8.7*   GFR: Estimated Creatinine Clearance: 139 mL/min (by C-G formula based on SCr of 0.76 mg/dL). Liver Function Tests: Recent Labs  Lab 08/31/19 0748  AST 15  ALT 12  ALKPHOS 90  BILITOT 1.2  PROT 6.5  ALBUMIN 2.0*   No results for input(s): LIPASE, AMYLASE in the last 168 hours. No results for input(s): AMMONIA in the last 168 hours. Coagulation Profile: No results for input(s): INR, PROTIME in the last 168 hours. Cardiac Enzymes: No results for input(s): CKTOTAL, CKMB, CKMBINDEX, TROPONINI in the last 168 hours. BNP (last 3 results) No results for input(s): PROBNP in the last 8760 hours. HbA1C: No results for input(s): HGBA1C in the last 72 hours. CBG: Recent Labs  Lab 08/30/19 0615 08/30/19 1245 08/30/19 1638 08/30/19 2055 08/31/19 0621  GLUCAP 111* 126* 127* 163* 109*   Lipid Profile: No results for input(s): CHOL, HDL, LDLCALC, TRIG, CHOLHDL, LDLDIRECT in the last 72 hours. Thyroid Function Tests: No results for input(s): TSH, T4TOTAL, FREET4, T3FREE, THYROIDAB in the last 72 hours. Anemia Panel: No results for input(s): VITAMINB12, FOLATE, FERRITIN, TIBC, IRON, RETICCTPCT in the last 72 hours. Sepsis Labs: No results for input(s): PROCALCITON, LATICACIDVEN in the last 168 hours.  No results found for this or any previous visit (from the past 240  hour(s)).       Radiology Studies: No results found.  Scheduled Meds: . Chlorhexidine Gluconate Cloth  6 each Topical Daily  . docusate sodium  100 mg Oral Daily  . insulin aspart  0-15 Units Subcutaneous TID WC  . insulin aspart  0-5 Units Subcutaneous QHS  . magnesium oxide  400 mg Oral BID  . Melatonin  6 mg Oral Once  . metoprolol tartrate  12.5 mg Oral BID  . ondansetron  4 mg Intravenous Once  . Ensure Max Protein  11 oz Oral BID  . sodium chloride flush  10-40 mL Intracatheter Q12H   Continuous Infusions: . sodium chloride 1,000 mL (08/24/19 2247)  . lactated ringers 10 mL/hr at 08/10/19 0845  . nafcillin IV 2 g (08/31/19 1005)     LOS: 30 days    Time spent:30 mins. More than 50% of that time was spent in counseling and/or coordination of care.      Jae DireJared E Tyisha Cressy, DO [facility] Pager [phone]  If 7PM-7AM, please contact night-coverage [email or web address] Password [email or web address] 08/31/2019, 11:55 AM

## 2019-08-31 NOTE — Progress Notes (Signed)
The chaplain visited William [name] as a follow-up from previous visits.  Ly is a long term patient with some evidences of past trauma and has expressed delight from receiving visits from the chaplain.  The chaplain provided spiritual support as well as emotional support.  The chaplain will follow-up with the patient.  Brion Aliment Chaplain Resident For questions concerning this note please contact me by pager [phone]

## 2019-08-31 NOTE — Evaluation (Signed)
Occupational Therapy Evaluation Patient Details Name: William [name] MRN: [id number] DOB: [birth year] Today's Date: 08/31/2019    History of Present Illness Patient is a 38 y/o male who presents with lower back pain, left shoulder pain, right foot and heel pain. Found to have right heel abscess s/p I&D 9/4, left upper chest wall abscess and mass s/p I&D and wound vac application 9/3, new onset DM, sepsis with MSSA bacteremia due to IV drug use. CT scan- left anterior chest wall soft tissue and intramuscular infection with extension into the thoracic cavity, air in the soft tissues of the right axilla adjacent to the right shoulder, multiple small pulmonary nodules. PMH- heroin IV drug use.   Clinical Impression   Pt PTA: Pt independent and working. Pt currently with deficits in RUE shoulder function with pain near acromion process with shoulder flex/shoulder abd. Pt given towel as dowel for AAROM with LUE guiding RUE in shoulder flex and shoulder abd. Ice applied after mobilizing. Pt able to shrug shoulders and perform elbow retraction without assist. OT to focus on RUE with ADL and mobilization. Pt would greatly benefit from continued OT skilled services for RUE HEP progression. OT following acutely.    Follow Up Recommendations  Outpatient OT(pt will probably progress to not needing OP OT)    Equipment Recommendations  None recommended by OT    Recommendations for Other Services       Precautions / Restrictions Precautions Precautions: Fall Restrictions Weight Bearing Restrictions: No      Mobility Bed Mobility Overal bed mobility: Needs Assistance Bed Mobility: Rolling;Sidelying to Sit;Sit to Sidelying Rolling: Supervision Sidelying to sit: Supervision     Sit to sidelying: Supervision General bed mobility comments: distant S for safety, no physical assist given  Transfers Overall transfer level: Needs assistance               General transfer comment: focusing on R  shoulder    Balance Overall balance assessment: Needs assistance Sitting-balance support: Feet supported;No upper extremity supported Sitting balance-Leahy Scale: Normal                                     ADL either performed or assessed with clinical judgement   ADL Overall ADL's : Needs assistance/impaired Eating/Feeding: Modified independent;Sitting   Grooming: Set up;Sitting   Upper Body Bathing: Set up;Sitting   Lower Body Bathing: Min guard;Sit to/from stand   Upper Body Dressing : Set up;Sitting   Lower Body Dressing: Min guard;Sitting/lateral leans   Toilet Transfer: Min guard   Toileting- Clothing Manipulation and Hygiene: Min guard;Sitting/lateral lean;Sit to/from stand       Functional mobility during ADLs: Min guard;Rolling walker General ADL Comments: Pt requires increased time for ADL, but R shoulder decreased ROM does not affect ADL/job. Pt tires easily.     Vision Baseline Vision/History: No visual deficits Vision Assessment?: No apparent visual deficits     Perception     Praxis      Pertinent Vitals/Pain Pain Assessment: No/denies pain Pain Score: 0-No pain Pain Intervention(s): Limited activity within patient's tolerance     Hand Dominance     Extremity/Trunk Assessment Upper Extremity Assessment Upper Extremity Assessment: Generalized weakness;RUE deficits/detail RUE Deficits / Details: RUE shoulder flexion 65* shoulder abduction 50* RUE Coordination: decreased gross motor   Lower Extremity Assessment Lower Extremity Assessment: Defer to PT evaluation   Cervical / Trunk Assessment Cervical / Trunk  Assessment: Normal   Communication Communication Communication: No difficulties   Cognition Arousal/Alertness: Awake/alert Behavior During Therapy: WFL for tasks assessed/performed Overall Cognitive Status: Within Functional Limits for tasks assessed                                     General Comments        Exercises Exercises: Other exercises Other Exercises Other Exercises: shoulder flexion, shoulder abduction in AAROM with towel/SPC x10 reps   Shoulder Instructions      Home Living Family/patient expects to be discharged to:: Private residence Living Arrangements: Spouse/significant other Available Help at Discharge: Friend(s);Available PRN/intermittently Type of Home: House Home Access: Stairs to enter [employer] of Steps: 2 Entrance Stairs-Rails: Right Home Layout: Two level;Able to live on main level with bedroom/bathroom                          Prior Functioning/Environment Level of Independence: Independent        Comments: 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        OT Problem List: Decreased strength;Decreased activity tolerance;Pain;Impaired UE functional use      OT Treatment/Interventions: Self-care/ADL training;Therapeutic exercise;Energy conservation;Therapeutic activities;Patient/family education;Balance training;DME and/or AE instruction;Manual therapy    OT Goals(Current goals can be found in the care plan section) Acute Rehab OT Goals Patient Stated Goal: to get home OT Goal Formulation: With patient Time For Goal Achievement: 09/14/19 Potential to Achieve Goals: Good ADL Goals Pt/caregiver will Perform Home Exercise Program: Right Upper extremity;Increased strength;Increased ROM;With written HEP provided Additional ADL Goal #1: Pt will increase to independence with OOB ADL with RUE pain <2/10.  OT Frequency: Min 2X/week   Barriers to D/C:            Co-evaluation              AM-PAC OT "6 Clicks" Daily Activity     Outcome Measure Help from another person eating meals?: None Help from another person taking care of personal grooming?: None Help from another person toileting, which includes using toliet, bedpan, or urinal?: None Help from another person bathing (including washing, rinsing,  drying)?: A Little Help from another person to put on and taking off regular upper body clothing?: None Help from another person to put on and taking off regular lower body clothing?: A Little 6 Click Score: 22   End of Session Equipment Utilized During Treatment: Gait belt Nurse Communication: Mobility status  Activity Tolerance: Patient tolerated treatment well Patient left: in bed;with call bell/phone within reach  OT Visit Diagnosis: Unsteadiness on feet (R26.81);Muscle weakness (generalized) (M62.81)                Time: 5852-7782 OT Time Calculation (min): 19 min Charges:  OT General Charges $OT Visit: 1 Visit OT Evaluation $OT Eval Moderate Complexity: 1 Mod  Darryl Nestle) Marsa Aris OTR/L Acute Rehabilitation Services Pager: [phone] Office: [place] 08/31/2019, 4:57 PM

## 2019-09-01 LAB — GLUCOSE, CAPILLARY
Glucose-Capillary: 121 mg/dL — ABNORMAL HIGH (ref 70–99)
Glucose-Capillary: 135 mg/dL — ABNORMAL HIGH (ref 70–99)
Glucose-Capillary: 133 mg/dL — ABNORMAL HIGH (ref 70–99)
Glucose-Capillary: 165 mg/dL — ABNORMAL HIGH (ref 70–99)

## 2019-09-01 NOTE — Progress Notes (Signed)
PROGRESS NOTE    William [name]  URK:[id number] DOB: 1981-10-13 DOA: 07/31/2019 PCP: William [name], No Pcp Per   Brief Narrative:  This is a very pleasant 38 year old male with history of IV drug abuse who was initially admitted with multiple complaints including lower back pain, left shoulder pain, right foot/heel pain.  Imaging done on presentation showed  right heel abscess, left upper chest wall abscess and mass.  CT scan showed left anterior chest wall soft tissue and intramuscular infection with extension into thoracic cavity, air in the soft tissue of the right axillary region to the right shoulder, multiple long nodules.  William [name] was initially admitted to ICU.  He underwent surgical debridement of the chest wall abscess and wound VAC placement bu CTCS,wound vac  removed on 9/25.  He was also found to have new onset diabetes here.  MRI had shown multiple epidural and paraspinal abscesses.  Blood cultures/wound cultures showed MSSA.  ID was initially following.  Started on nafcillin via midline.  Last day of antibiotics is 10/28 as per ID who recommended 8 weeks of antibiotics starting from 07/31/2019.  Anticipate inpatient hospitalization until he completes antibiotic therapy.  Assessment & Plan:   Principal Problem:   Staphylococcus aureus bacteremia Active Problems:   Sepsis (HCC)   Abscess of paraspinous muscles   Osteomyelitis of lumbar spine (HCC)   Epidural abscess of spine due to infective embolism   Abnormal chest CT   Septic embolism (HCC)   Community acquired pneumonia   IV drug abuse (HCC)   Heroin abuse (HCC)   Elevated troponin   UTI (urinary tract infection)   Renal failure   Hyperglycemia   Suspected endocarditis   Chest wall abscess   Abscess of right heel   Acute osteomyelitis of lumbar spine (HCC)   Sepsis secondary to MSSA bacteremia/chest wall abscess/septic pulmonary emboli secondary to IV drug abuse (heroin): Sepsis resolved.   Presented with multiple abscesses  as above.  Underwent incision and drainage and debridement of left chest wall with wound VAC and is being followed by CTCS. Wound vac changed yesterday. Status post bedside I&D right heel.  Wound culture, blood cultures showed MSSA.  Also had multiple epidural abscess/discitis was seen by neurosurgery, no recommendation for surgery.  Intraoperative TEE did not show any evidence of endocarditis.   Currently on nafcillin which will be continued through 10/28 total of 8 weeks starting from 07/31/2019  Abdominal discomfort/generalized body ache: resolved.  Continue muscle relaxants/NSAIDs.  Right shoulder septic bursitis with limited right shoulder ROM: Improving.  Improving with PT.  CT (9/10) showed septic bursitis without evidence of  Osteomyelitis/septic arthitis.  Orthopedics was following.  Status post arthrocentesis which were suggestive of septic bursitis.  Plan is to continue IV antibiotics.  Following physical therapy with some improvement in ROM. Continue PT  Low back pain in setting of infectious findings as above.  Has been present for quite some time pain.  Significant improvement with physical therapy and NSAIDs no improvement with narcotics.  New onset diabetes mellitus: Hemoglobin A1c of 7.8.  Continue current regimen.  Anti-hyperglycemics can be changed to oral on discharge.   Hypokalemia: Resolved   Normocytic normochromic anemia: follow up CBC  IV drug abuse: Counseled for cessation.  William [name] is motivated to quit.  We will consider communicating with IM teaching service  when he is closer to the discharge so that he can follow-up as an outpatient internal medicine  service/addiction clinic  Nutrition Problem: Increased nutrient needs Etiology: wound  healing      DVT prophylaxis:Lovenox Code Status: Full Family Communication: Discussed with mother at bedside Disposition Plan: Home after completion of antibiotic therapy   Consultants: PCCM, CTCS, neurosurgery, ID   Procedures: Chest wall abscess incision/drainage right heel incision/drainage  Antimicrobials:  Anti-infectives (From admission, onward)   Start     Dose/Rate Route Frequency Ordered Stop   08/10/19 0936  vancomycin (VANCOCIN) 1,000 mg in sodium chloride 0.9 % 1,000 mL irrigation  Status:  Discontinued       As needed 08/10/19 0937 08/10/19 1121   08/10/19 0600  ceFAZolin (ANCEF) IVPB 2g/100 mL premix     2 g 200 mL/hr over 30 Minutes Intravenous To Short Stay 08/10/19 0036 08/10/19 0625   08/09/19 1730  ceFAZolin (ANCEF) IVPB 2g/100 mL premix  Status:  Discontinued     2 g 200 mL/hr over 30 Minutes Intravenous 30 min pre-op 08/09/19 1730 08/10/19 0036   08/06/19 1430  nafcillin injection 2 g  Status:  Discontinued     2 g Intravenous Every 4 hours 08/06/19 1310 08/06/19 1359   08/06/19 1430  nafcillin 2 g in sodium chloride 0.9 % 100 mL IVPB     2 g 200 mL/hr over 30 Minutes Intravenous Every 4 hours 08/06/19 1359     08/03/19 0900  fluconazole (DIFLUCAN) tablet 200 mg     200 mg Oral  Once 08/03/19 0842 08/03/19 0917   08/02/19 2200  ceFAZolin (ANCEF) IVPB 2g/100 mL premix  Status:  Discontinued     2 g 200 mL/hr over 30 Minutes Intravenous Every 8 hours 08/02/19 1534 08/06/19 1310   08/02/19 1600  metroNIDAZOLE (FLAGYL) tablet 500 mg  Status:  Discontinued     500 mg Oral Every 8 hours 08/02/19 1534 08/06/19 1454   08/01/19 1200  vancomycin (VANCOCIN) IVPB 750 mg/150 ml premix  Status:  Discontinued     750 mg 150 mL/hr over 60 Minutes Intravenous Every 8 hours 08/01/19 0224 08/02/19 1533   08/01/19 1130  ceFAZolin (ANCEF) IVPB 2g/100 mL premix     2 g 200 mL/hr over 30 Minutes Intravenous 30 min pre-op 08/01/19 0828 08/01/19 1319   08/01/19 1000  ceFEPIme (MAXIPIME) 2 g in sodium chloride 0.9 % 100 mL IVPB  Status:  Discontinued     2 g 200 mL/hr over 30 Minutes Intravenous Every 8 hours 08/01/19 0224 08/02/19 1533   08/01/19 0100  ceFEPIme (MAXIPIME) 2 g in sodium chloride  0.9 % 100 mL IVPB     2 g 200 mL/hr over 30 Minutes Intravenous  Once 08/01/19 0056 08/01/19 0335   08/01/19 0100  metroNIDAZOLE (FLAGYL) IVPB 500 mg     500 mg 100 mL/hr over 60 Minutes Intravenous  Once 08/01/19 0056 08/01/19 0335   08/01/19 0100  vancomycin (VANCOCIN) IVPB 1000 mg/200 mL premix     1,000 mg 200 mL/hr over 60 Minutes Intravenous  Once 08/01/19 0056 08/01/19 0335      Subjective:  Seen and examined at bedside in no acute distress resting comfortably.  Mother present.  William [name] states he has had significant improvement in his right shoulder mobility as well as low back pain with a combination of physical therapy and NSAIDs.  He denies improvement in pain with opiates in terms of his low back.  Otherwise he is in good spirits.  Objective: Vitals:   08/31/19 1518 08/31/19 2106 09/01/19 0800 09/01/19 1250  BP:   (!) 137/91 121/84  Pulse: 78 74 98 72  Resp: Temp: 98.5 F (36.9 C) 98.1 F (36.7 C) 98.6 F (37 C) 98.2 F (36.8 C)  TempSrc: Oral Oral Oral Oral  SpO2: 100% 99% 99% 100%  Weight:      Height:        Intake/Output Summary (Last 24 hours) at 09/01/2019 1429 Last data filed at 09/01/2019 0753 Gross per 24 hour  Intake 60 ml  Output 450 ml  Net -390 ml   Filed Weights   08/15/19 0500 08/26/19 0108 08/27/19 0430  Weight: 77.1 kg 78.8 kg 78.5 kg    Examination:   Physical Exam Vitals signs and nursing note reviewed. Exam conducted with a chaperone present.  Constitutional:      Appearance: Normal appearance.  HENT:     Head: Normocephalic and atraumatic.     Nose: Nose normal.     Mouth/Throat:     Mouth: Mucous membranes are moist.  Eyes:     Extraocular Movements: Extraocular movements intact.     Conjunctiva/sclera: Conjunctivae normal.  Neck:     Musculoskeletal: Normal range of motion. No neck rigidity.  Cardiovascular:     Rate and Rhythm: Normal rate and regular rhythm.  Pulmonary:     Effort: Pulmonary effort is  normal.     Breath sounds: Normal breath sounds.  Abdominal:     General: Abdomen is flat.     Palpations: Abdomen is soft.  Musculoskeletal:     Comments: Improved but decreased range of motion with flexion and abduction of right shoulder limited to 90 degrees.  No erythema or swelling of shoulder no neurologic changes of right upper extremity  4/5 right upper extremity muscle strength, 5/5 left upper extremity muscle strength.  Bilateral lower extremities with 5/5  Neurological:     Mental Status: He is alert.  Psychiatric:        Mood and Affect: Mood normal.        Behavior: Behavior normal.         Data Reviewed: I have personally reviewed following labs and imaging studies  CBC: Recent Labs  Lab 08/31/19 1730  WBC 4.7  NEUTROABS 3.6  HGB 10.0*  HCT 31.4*  MCV 94.9  PLT 445*   Basic Metabolic Panel: Recent Labs  Lab 08/31/19 0748  NA 141  K 3.7  CL 104  CO2 25  GLUCOSE 115*  BUN 14  CREATININE 0.76  CALCIUM 8.7*   GFR: Estimated Creatinine Clearance: 139 mL/min (by C-G formula based on SCr of 0.76 mg/dL). Liver Function Tests: Recent Labs  Lab 08/31/19 0748  AST 15  ALT 12  ALKPHOS 90  BILITOT 1.2  PROT 6.5  ALBUMIN 2.0*   No results for input(s): LIPASE, AMYLASE in the last 168 hours. No results for input(s): AMMONIA in the last 168 hours. Coagulation Profile: No results for input(s): INR, PROTIME in the last 168 hours. Cardiac Enzymes: No results for input(s): CKTOTAL, CKMB, CKMBINDEX, TROPONINI in the last 168 hours. BNP (last 3 results) No results for input(s): PROBNP in the last 8760 hours. HbA1C: No results for input(s): HGBA1C in the last 72 hours. CBG: Recent Labs  Lab 08/31/19 1335 08/31/19 1521 08/31/19 2148 09/01/19 0620 09/01/19 1247  GLUCAP 114* 103* 140* 121* 135*   Lipid Profile: No results for input(s): CHOL, HDL, LDLCALC, TRIG, CHOLHDL, LDLDIRECT in the last 72 hours. Thyroid Function Tests: No results for  input(s): TSH, T4TOTAL, FREET4, T3FREE, THYROIDAB in the last 72 hours. Anemia Panel: No  results for input(s): VITAMINB12, FOLATE, FERRITIN, TIBC, IRON, RETICCTPCT in the last 72 hours. Sepsis Labs: No results for input(s): PROCALCITON, LATICACIDVEN in the last 168 hours.  No results found for this or any previous visit (from the past 240 hour(s)).       Radiology Studies: No results found.      Scheduled Meds: . Chlorhexidine Gluconate Cloth  6 each Topical Daily  . docusate sodium  100 mg Oral Daily  . insulin aspart  0-15 Units Subcutaneous TID WC  . insulin aspart  0-5 Units Subcutaneous QHS  . magnesium oxide  400 mg Oral BID  . Melatonin  6 mg Oral Once  . metoprolol tartrate  12.5 mg Oral BID  . ondansetron  4 mg Intravenous Once  . Ensure Max Protein  11 oz Oral BID  . sodium chloride flush  10-40 mL Intracatheter Q12H   Continuous Infusions: . sodium chloride 1,000 mL (08/24/19 2247)  . lactated ringers 10 mL/hr at 08/10/19 0845  . nafcillin IV 2 g (09/01/19 1415)     LOS: 31 days    Time spent:25 mins. More than 50% of that time was spent in counseling and/or coordination of care.      Harold Hedge, DO [facility] Pager [phone]  If 7PM-7AM, please contact night-coverage [email or web address] Password [email or web address] 09/01/2019, 2:29 PM

## 2019-09-01 NOTE — Progress Notes (Signed)
22 Days Post-Op Procedure(s) (LRB): WOUND DEBRIDEMENT (N/A) WOUND VAC CHANGE (N/A) Subjective: Feels good, no complaints.  No new issues related to the left chest wound.   Objective: Vital signs in last 24 hours: Temp:  [98.1 F (36.7 C)-98.7 F (37.1 C)] 98.6 F (37 C) (10/03 0800) Pulse Rate:  [74-98] 98 (10/03 0800) Resp:  [16-20] 18 (10/03 0800) BP: (125-152)/(85-111) 137/91 (10/03 0800) SpO2:  [99 %-100 %] 99 % (10/03 0800)     Intake/Output from previous day: 10/02 0701 - 10/03 0700 In: 10 [I.V.:10] Out: 450 [Urine:450] Intake/Output this shift: Total I/O In: 60 [P.O.:60] Out: -   General appearance:alert, cooperative and no distress Wound:The left chest wound dressing was removed. The wound is clean and granulating. Minimal drainage and no eschar. A new saline moistend dressing was placed.  Lab Results: Recent Labs    08/31/19 1730  WBC 4.7  HGB 10.0*  HCT 31.4*  PLT 445*   BMET:  Recent Labs    08/31/19 0748  NA 141  K 3.7  CL 104  CO2 25  GLUCOSE 115*  BUN 14  CREATININE 0.76  CALCIUM 8.7*    PT/INR: No results for input(s): LABPROT, INR in the last 72 hours. ABG    Component Value Date/Time   PHART 7.482 (H) 08/01/2019 0909   HCO3 31.0 (H) 08/01/2019 0909   TCO2 32 08/01/2019 0909   O2SAT 97.0 08/01/2019 0909   CBG (last 3)  Recent Labs    08/31/19 1521 08/31/19 2148 09/01/19 0620  GLUCAP 103* 140* 121*    Assessment/Plan: S/P Procedure(s) (LRB): WOUND DEBRIDEMENT (N/A) WOUND VAC CHANGE (N/A)  -POD-30incision and drainage of the left Navarro joint abscess and subsequent wound vac placement and POD-23excisional debridement in the OR. The wound vac was discontinued on 9/29 and saline moist to dry dressings have been changed daily. The woundappearsclean andis continuing to granulate appropriately.There is nosignificantdrainage. Continuesaline moist to dry dressings daily. Will continue to follow peripherally    LOS: 31  days    Antony Odea. PA-C [phone] 09/01/2019

## 2019-09-02 LAB — GLUCOSE, CAPILLARY
Glucose-Capillary: 121 mg/dL — ABNORMAL HIGH (ref 70–99)
Glucose-Capillary: 230 mg/dL — ABNORMAL HIGH (ref 70–99)
Glucose-Capillary: 104 mg/dL — ABNORMAL HIGH (ref 70–99)
Glucose-Capillary: 148 mg/dL — ABNORMAL HIGH (ref 70–99)

## 2019-09-02 MED ORDER — OXYCODONE HCL 5 MG PO TABS
5.0000 mg | ORAL_TABLET | Freq: Two times a day (BID) | ORAL | Status: DC | PRN
  Administered 2019-09-02 – 2019-09-05 (×5): 5 mg via ORAL
  Filled 2019-09-02 (×7): qty 1

## 2019-09-02 NOTE — Progress Notes (Signed)
PROGRESS NOTE    William [name]  ZOX:[id number] DOB: 07-30-1981 DOA: 07/31/2019 PCP: Patient, No Pcp Per   Brief Narrative:  This is a very pleasant 38 year old male with history of IV drug abuse who was initially admitted with multiple complaints including lower back pain, left shoulder pain, right foot/heel pain.  Imaging done on presentation showed  right heel abscess, left upper chest wall abscess and mass.  CT scan showed left anterior chest wall soft tissue and intramuscular infection with extension into thoracic cavity, air in the soft tissue of the right axillary region to the right shoulder, multiple long nodules.  Patient was initially admitted to ICU.  He underwent surgical debridement of the chest wall abscess and wound VAC placement bu CTCS,wound vac  removed on 9/25.  He was also found to have new onset diabetes here.  MRI had shown multiple epidural and paraspinal abscesses.  Blood cultures/wound cultures showed MSSA.  ID was initially following.  Started on nafcillin via midline.  Last day of antibiotics is 10/28 as per ID who recommended 8 weeks of antibiotics starting from 07/31/2019.  Anticipate inpatient hospitalization until he completes antibiotic therapy.  Assessment & Plan:   Principal Problem:   Staphylococcus aureus bacteremia Active Problems:   Sepsis (HCC)   Abscess of paraspinous muscles   Osteomyelitis of lumbar spine (HCC)   Epidural abscess of spine due to infective embolism   Abnormal chest CT   Septic embolism (HCC)   Community acquired pneumonia   IV drug abuse (HCC)   Heroin abuse (HCC)   Elevated troponin   UTI (urinary tract infection)   Renal failure   Hyperglycemia   Suspected endocarditis   Chest wall abscess   Abscess of right heel   Acute osteomyelitis of lumbar spine (HCC)   Sepsis secondary to MSSA bacteremia/chest wall abscess/septic pulmonary emboli secondary to IV drug abuse (heroin): Sepsis resolved. Stable  Presented with multiple  abscesses as above.  Underwent incision and drainage and debridement of left chest wall with wound VAC and is being followed by CTCS. Status post bedside I&D right heel.  Wound culture, blood cultures showed MSSA.  Also had multiple epidural abscess/discitis was seen by neurosurgery, no recommendation for surgery.  Intraoperative TEE did not show any evidence of endocarditis.   Currently on nafcillin which will be continued through 10/28 total of 8 weeks starting from 07/31/2019  Loose bowel movements  - discontinue Colace - If no improvement, consider C diff in setting of prolonged antibiotic use as above  Abdominal discomfort/generalized body ache: resolved.  Continue muscle relaxants/NSAIDs.  Right shoulder septic bursitis with limited right shoulder ROM: Improving.  Improving with PT.  CT (9/10) showed septic bursitis without evidence of  Osteomyelitis/septic arthitis.  Orthopedics was following.  Status post arthrocentesis which were suggestive of septic bursitis.  Plan is to continue IV antibiotics.  Following physical therapy with some improvement in ROM. Continue PT  Low back pain in setting of infectious findings as above.  Has been present for quite some time pain.  Significant improvement with physical therapy and NSAIDs no improvement with narcotics. oxocodone scheduled for every 4 hours as needed but patient taking twice daily without much improvement and is ok with weaning off - Oxycodone twice daily as needed for now and titrate down as appropriate - continue NSAID and PT  New onset diabetes mellitus: Hemoglobin A1c of 7.8.  Continue current regimen.  Anti-hyperglycemics can be changed to oral on discharge.   Hypokalemia: Resolved  Normocytic normochromic anemia: monitor  IV drug abuse: Counseled for cessation.  Patient is motivated to quit.  We will consider communicating with IM teaching service  when he is closer to the discharge so that he can follow-up as an outpatient  internal medicine  service/addiction clinic  Nutrition Problem: Increased nutrient needs Etiology: wound healing      DVT prophylaxis:Lovenox Code Status: Full Family Communication: Discussed with mother at bedside Disposition Plan: Home after completion of antibiotic therapy   Consultants: PCCM, CTCS, neurosurgery, ID  Procedures: Chest wall abscess incision/drainage right heel incision/drainage  Antimicrobials:  Anti-infectives (From admission, onward)   Start     Dose/Rate Route Frequency Ordered Stop   08/10/19 0936  vancomycin (VANCOCIN) 1,000 mg in sodium chloride 0.9 % 1,000 mL irrigation  Status:  Discontinued       As needed 08/10/19 0937 08/10/19 1121   08/10/19 0600  ceFAZolin (ANCEF) IVPB 2g/100 mL premix     2 g 200 mL/hr over 30 Minutes Intravenous To Short Stay 08/10/19 0036 08/10/19 0625   08/09/19 1730  ceFAZolin (ANCEF) IVPB 2g/100 mL premix  Status:  Discontinued     2 g 200 mL/hr over 30 Minutes Intravenous 30 min pre-op 08/09/19 1730 08/10/19 0036   08/06/19 1430  nafcillin injection 2 g  Status:  Discontinued     2 g Intravenous Every 4 hours 08/06/19 1310 08/06/19 1359   08/06/19 1430  nafcillin 2 g in sodium chloride 0.9 % 100 mL IVPB     2 g 200 mL/hr over 30 Minutes Intravenous Every 4 hours 08/06/19 1359     08/03/19 0900  fluconazole (DIFLUCAN) tablet 200 mg     200 mg Oral  Once 08/03/19 0842 08/03/19 0917   08/02/19 2200  ceFAZolin (ANCEF) IVPB 2g/100 mL premix  Status:  Discontinued     2 g 200 mL/hr over 30 Minutes Intravenous Every 8 hours 08/02/19 1534 08/06/19 1310   08/02/19 1600  metroNIDAZOLE (FLAGYL) tablet 500 mg  Status:  Discontinued     500 mg Oral Every 8 hours 08/02/19 1534 08/06/19 1454   08/01/19 1200  vancomycin (VANCOCIN) IVPB 750 mg/150 ml premix  Status:  Discontinued     750 mg 150 mL/hr over 60 Minutes Intravenous Every 8 hours 08/01/19 0224 08/02/19 1533   08/01/19 1130  ceFAZolin (ANCEF) IVPB 2g/100 mL premix     2 g  200 mL/hr over 30 Minutes Intravenous 30 min pre-op 08/01/19 0828 08/01/19 1319   08/01/19 1000  ceFEPIme (MAXIPIME) 2 g in sodium chloride 0.9 % 100 mL IVPB  Status:  Discontinued     2 g 200 mL/hr over 30 Minutes Intravenous Every 8 hours 08/01/19 0224 08/02/19 1533   08/01/19 0100  ceFEPIme (MAXIPIME) 2 g in sodium chloride 0.9 % 100 mL IVPB     2 g 200 mL/hr over 30 Minutes Intravenous  Once 08/01/19 0056 08/01/19 0335   08/01/19 0100  metroNIDAZOLE (FLAGYL) IVPB 500 mg     500 mg 100 mL/hr over 60 Minutes Intravenous  Once 08/01/19 0056 08/01/19 0335   08/01/19 0100  vancomycin (VANCOCIN) IVPB 1000 mg/200 mL premix     1,000 mg 200 mL/hr over 60 Minutes Intravenous  Once 08/01/19 0056 08/01/19 0335      Subjective:  Laying in bed comfortably with mom at bedside. Complaining of loose BMs and asking for colace to be discontinued.  Objective: Vitals:   09/01/19 1250 09/01/19 1702 09/01/19 2012 09/02/19 0745  BP: 121/84 (!) 129/91 129/82 (!) 153/86  Pulse: 72 78 81 99  Resp: 17 16 15 16   Temp: 98.2 F (36.8 C) 98.9 F (37.2 C) (!) 97.4 F (36.3 C) 98.6 F (37 C)  TempSrc: Oral Oral Oral Oral  SpO2: 100% 100% 100% 100%  Weight:      Height:        Intake/Output Summary (Last 24 hours) at 09/02/2019 1613 Last data filed at 09/02/2019 1500 Gross per 24 hour  Intake 700 ml  Output -  Net 700 ml   Filed Weights   08/15/19 0500 08/26/19 0108 08/27/19 0430  Weight: 77.1 kg 78.8 kg 78.5 kg    Examination:   Physical Exam Vitals signs and nursing note reviewed. Exam conducted with a chaperone present.  Constitutional:      Appearance: Normal appearance.  HENT:     Head: Normocephalic and atraumatic.     Nose: Nose normal.     Mouth/Throat:     Mouth: Mucous membranes are moist.  Eyes:     Extraocular Movements: Extraocular movements intact.     Conjunctiva/sclera: Conjunctivae normal.  Neck:     Musculoskeletal: Normal range of motion. No neck rigidity.   Cardiovascular:     Rate and Rhythm: Normal rate and regular rhythm.  Pulmonary:     Effort: Pulmonary effort is normal.     Breath sounds: Normal breath sounds.  Abdominal:     General: Abdomen is flat.     Palpations: Abdomen is soft.  Musculoskeletal:     Comments: No tenderness to palpation along the spine  Neurological:     Mental Status: He is alert.  Psychiatric:        Mood and Affect: Mood normal.        Behavior: Behavior normal.         Data Reviewed: I have personally reviewed following labs and imaging studies  CBC: Recent Labs  Lab 08/31/19 1730  WBC 4.7  NEUTROABS 3.6  HGB 10.0*  HCT 31.4*  MCV 94.9  PLT 956*   Basic Metabolic Panel: Recent Labs  Lab 08/31/19 0748  NA 141  K 3.7  CL 104  CO2 25  GLUCOSE 115*  BUN 14  CREATININE 0.76  CALCIUM 8.7*   GFR: Estimated Creatinine Clearance: 139 mL/min (by C-G formula based on SCr of 0.76 mg/dL). Liver Function Tests: Recent Labs  Lab 08/31/19 0748  AST 15  ALT 12  ALKPHOS 90  BILITOT 1.2  PROT 6.5  ALBUMIN 2.0*   No results for input(s): LIPASE, AMYLASE in the last 168 hours. No results for input(s): AMMONIA in the last 168 hours. Coagulation Profile: No results for input(s): INR, PROTIME in the last 168 hours. Cardiac Enzymes: No results for input(s): CKTOTAL, CKMB, CKMBINDEX, TROPONINI in the last 168 hours. BNP (last 3 results) No results for input(s): PROBNP in the last 8760 hours. HbA1C: No results for input(s): HGBA1C in the last 72 hours. CBG: Recent Labs  Lab 09/01/19 1247 09/01/19 1808 09/01/19 2118 09/02/19 0618 09/02/19 1155  GLUCAP 135* 133* 165* 121* 230*   Lipid Profile: No results for input(s): CHOL, HDL, LDLCALC, TRIG, CHOLHDL, LDLDIRECT in the last 72 hours. Thyroid Function Tests: No results for input(s): TSH, T4TOTAL, FREET4, T3FREE, THYROIDAB in the last 72 hours. Anemia Panel: No results for input(s): VITAMINB12, FOLATE, FERRITIN, TIBC, IRON,  RETICCTPCT in the last 72 hours. Sepsis Labs: No results for input(s): PROCALCITON, LATICACIDVEN in the last 168 hours.  No  results found for this or any previous visit (from the past 240 hour(s)).       Radiology Studies: No results found.      Scheduled Meds: . Chlorhexidine Gluconate Cloth  6 each Topical Daily  . insulin aspart  0-15 Units Subcutaneous TID WC  . insulin aspart  0-5 Units Subcutaneous QHS  . magnesium oxide  400 mg Oral BID  . Melatonin  6 mg Oral Once  . metoprolol tartrate  12.5 mg Oral BID  . ondansetron  4 mg Intravenous Once  . Ensure Max Protein  11 oz Oral BID  . sodium chloride flush  10-40 mL Intracatheter Q12H   Continuous Infusions: . sodium chloride 1,000 mL (08/24/19 2247)  . lactated ringers 10 mL/hr at 08/10/19 0845  . nafcillin IV 2 g (09/02/19 1246)     LOS: 32 days    Time spent:25 mins. More than 50% of that time was spent in counseling and/or coordination of care.      Jae Dire, DO [facility] Pager [phone]  If 7PM-7AM, please contact night-coverage [email or web address] Password [email or web address] 09/02/2019, 4:13 PM

## 2019-09-03 LAB — GLUCOSE, CAPILLARY
Glucose-Capillary: 120 mg/dL — ABNORMAL HIGH (ref 70–99)
Glucose-Capillary: 138 mg/dL — ABNORMAL HIGH (ref 70–99)
Glucose-Capillary: 89 mg/dL (ref 70–99)
Glucose-Capillary: 105 mg/dL — ABNORMAL HIGH (ref 70–99)

## 2019-09-03 NOTE — Progress Notes (Signed)
PROGRESS NOTE    William [name]  JTT:[id number] DOB: 10/12/1981 DOA: 07/31/2019 PCP: Patient, No Pcp Per   Brief Narrative:  This is a very pleasant 38 year old male with history of IV drug abuse who was initially admitted with multiple complaints including lower back pain, left shoulder pain, right foot/heel pain.  Imaging done on presentation showed  right heel abscess, left upper chest wall abscess and mass.  CT scan showed left anterior chest wall soft tissue and intramuscular infection with extension into thoracic cavity, air in the soft tissue of the right axillary region to the right shoulder, multiple long nodules.  Patient was initially admitted to ICU.  He underwent surgical debridement of the chest wall abscess and wound VAC placement bu CTCS,wound vac  removed on 9/25.  He was also found to have new onset diabetes here.  MRI had shown multiple epidural and paraspinal abscesses.  Blood cultures/wound cultures showed MSSA.  ID was initially following.  Started on nafcillin via midline.  Last day of antibiotics is 10/28 as per ID who recommended 8 weeks of antibiotics starting from 07/31/2019.  Anticipate inpatient hospitalization until he completes antibiotic therapy.  Assessment & Plan:   Principal Problem:   Staphylococcus aureus bacteremia Active Problems:   Sepsis (HCC)   Abscess of paraspinous muscles   Osteomyelitis of lumbar spine (HCC)   Epidural abscess of spine due to infective embolism   Abnormal chest CT   Septic embolism (HCC)   Community acquired pneumonia   IV drug abuse (HCC)   Heroin abuse (HCC)   Elevated troponin   UTI (urinary tract infection)   Renal failure   Hyperglycemia   Suspected endocarditis   Chest wall abscess   Abscess of right heel   Acute osteomyelitis of lumbar spine (HCC)   Sepsis secondary to MSSA bacteremia/chest wall abscess/septic pulmonary emboli secondary to IV drug abuse (heroin): Sepsis resolved. Stable  Presented with multiple  abscesses as above.  Underwent incision and drainage and debridement of left chest wall with wound VAC and is being followed by CTCS. Status post bedside I&D right heel.  Wound culture, blood cultures showed MSSA.  Also had multiple epidural abscess/discitis was seen by neurosurgery, no recommendation for surgery.  Intraoperative TEE did not show any evidence of endocarditis.   Currently on nafcillin which will be continued through 10/28 total of 8 weeks starting from 07/31/2019  Loose bowel movements Colace discontinued yesterday - If no improvement, consider C diff in setting of prolonged antibiotic use as above -Monitor for constipation in setting of opiates  Abdominal discomfort/generalized body ache: resolved.  Continue muscle relaxants/NSAIDs.  Right shoulder septic bursitis with limited right shoulder ROM: Improving.  Improving with PT.  CT (9/10) showed septic bursitis without evidence of  Osteomyelitis/septic arthitis.  Orthopedics was following.  Status post arthrocentesis which were suggestive of septic bursitis.  Plan is to continue IV antibiotics.  Following physical therapy with some improvement in ROM. Continue PT  Low back pain in setting of infectious findings as above.  Has been present for quite some time pain.  Significant improvement with physical therapy and NSAIDs no improvement with narcotics. oxocodone scheduled for every 4 hours as needed but patient taking twice daily without much improvement and is ok with weaning off - Oxycodone twice daily as needed for now and titrate down as appropriate - continue NSAID and PT  New onset diabetes mellitus: Hemoglobin A1c of 7.8.  Continue current regimen.  Anti-hyperglycemics can be changed to oral  on discharge.   Hypokalemia: Resolved   Normocytic normochromic anemia: monitor  IV drug abuse: Counseled for cessation.  Patient is motivated to quit.  We will consider communicating with IM teaching service  when he is closer to the  discharge so that he can follow-up as an outpatient internal medicine  service/addiction clinic  Nutrition Problem: Increased nutrient needs Etiology: wound healing      DVT prophylaxis:Lovenox Code Status: Full Disposition Plan: Home after completion of antibiotic therapy   Consultants: PCCM, CTCS, neurosurgery, ID  Procedures: Chest wall abscess incision/drainage right heel incision/drainage  Antimicrobials:  Anti-infectives (From admission, onward)   Start     Dose/Rate Route Frequency Ordered Stop   08/10/19 0936  vancomycin (VANCOCIN) 1,000 mg in sodium chloride 0.9 % 1,000 mL irrigation  Status:  Discontinued       As needed 08/10/19 0937 08/10/19 1121   08/10/19 0600  ceFAZolin (ANCEF) IVPB 2g/100 mL premix     2 g 200 mL/hr over 30 Minutes Intravenous To Short Stay 08/10/19 0036 08/10/19 0625   08/09/19 1730  ceFAZolin (ANCEF) IVPB 2g/100 mL premix  Status:  Discontinued     2 g 200 mL/hr over 30 Minutes Intravenous 30 min pre-op 08/09/19 1730 08/10/19 0036   08/06/19 1430  nafcillin injection 2 g  Status:  Discontinued     2 g Intravenous Every 4 hours 08/06/19 1310 08/06/19 1359   08/06/19 1430  nafcillin 2 g in sodium chloride 0.9 % 100 mL IVPB     2 g 200 mL/hr over 30 Minutes Intravenous Every 4 hours 08/06/19 1359     08/03/19 0900  fluconazole (DIFLUCAN) tablet 200 mg     200 mg Oral  Once 08/03/19 0842 08/03/19 0917   08/02/19 2200  ceFAZolin (ANCEF) IVPB 2g/100 mL premix  Status:  Discontinued     2 g 200 mL/hr over 30 Minutes Intravenous Every 8 hours 08/02/19 1534 08/06/19 1310   08/02/19 1600  metroNIDAZOLE (FLAGYL) tablet 500 mg  Status:  Discontinued     500 mg Oral Every 8 hours 08/02/19 1534 08/06/19 1454   08/01/19 1200  vancomycin (VANCOCIN) IVPB 750 mg/150 ml premix  Status:  Discontinued     750 mg 150 mL/hr over 60 Minutes Intravenous Every 8 hours 08/01/19 0224 08/02/19 1533   08/01/19 1130  ceFAZolin (ANCEF) IVPB 2g/100 mL premix     2 g  200 mL/hr over 30 Minutes Intravenous 30 min pre-op 08/01/19 0828 08/01/19 1319   08/01/19 1000  ceFEPIme (MAXIPIME) 2 g in sodium chloride 0.9 % 100 mL IVPB  Status:  Discontinued     2 g 200 mL/hr over 30 Minutes Intravenous Every 8 hours 08/01/19 0224 08/02/19 1533   08/01/19 0100  ceFEPIme (MAXIPIME) 2 g in sodium chloride 0.9 % 100 mL IVPB     2 g 200 mL/hr over 30 Minutes Intravenous  Once 08/01/19 0056 08/01/19 0335   08/01/19 0100  metroNIDAZOLE (FLAGYL) IVPB 500 mg     500 mg 100 mL/hr over 60 Minutes Intravenous  Once 08/01/19 0056 08/01/19 0335   08/01/19 0100  vancomycin (VANCOCIN) IVPB 1000 mg/200 mL premix     1,000 mg 200 mL/hr over 60 Minutes Intravenous  Once 08/01/19 0056 08/01/19 0335      Subjective:  Patient resting in bed watching TV states that his back pain this morning resolved with ibuprofen.  Otherwise no complaints  Objective: Vitals:   09/02/19 2100 09/02/19 2310 09/03/19 0823 09/03/19  1142  BP: (!) 145/78 (!) 136/95 (!) 144/90 (!) 132/93  Pulse:  89 85 66  Resp: 18 15 20 15   Temp: 98.8 F (37.1 C) 97.8 F (36.6 C) 97.6 F (36.4 C) 98.9 F (37.2 C)  TempSrc: Oral Oral Oral Oral  SpO2: 99% 98% 100% 100%  Weight:      Height:        Intake/Output Summary (Last 24 hours) at 09/03/2019 1337 Last data filed at 09/03/2019 0515 Gross per 24 hour  Intake 880 ml  Output 1200 ml  Net -320 ml   Filed Weights   08/15/19 0500 08/26/19 0108 08/27/19 0430  Weight: 77.1 kg 78.8 kg 78.5 kg    Examination:   Physical Exam Vitals signs and nursing note reviewed. Exam conducted with a chaperone present.  Constitutional:      Appearance: Normal appearance.  HENT:     Head: Normocephalic and atraumatic.  Eyes:     Extraocular Movements: Extraocular movements intact.     Conjunctiva/sclera: Conjunctivae normal.  Neck:     Musculoskeletal: Normal range of motion. No neck rigidity.  Cardiovascular:     Rate and Rhythm: Normal rate and regular rhythm.   Pulmonary:     Effort: Pulmonary effort is normal.     Breath sounds: Normal breath sounds.  Abdominal:     General: Abdomen is flat.     Palpations: Abdomen is soft.  Neurological:     Mental Status: He is alert.  Psychiatric:        Mood and Affect: Mood normal.        Behavior: Behavior normal.         Data Reviewed: I have personally reviewed following labs and imaging studies  CBC: Recent Labs  Lab 08/31/19 1730  WBC 4.7  NEUTROABS 3.6  HGB 10.0*  HCT 31.4*  MCV 94.9  PLT 053*   Basic Metabolic Panel: Recent Labs  Lab 08/31/19 0748  NA 141  K 3.7  CL 104  CO2 25  GLUCOSE 115*  BUN 14  CREATININE 0.76  CALCIUM 8.7*   GFR: Estimated Creatinine Clearance: 139 mL/min (by C-G formula based on SCr of 0.76 mg/dL). Liver Function Tests: Recent Labs  Lab 08/31/19 0748  AST 15  ALT 12  ALKPHOS 90  BILITOT 1.2  PROT 6.5  ALBUMIN 2.0*   No results for input(s): LIPASE, AMYLASE in the last 168 hours. No results for input(s): AMMONIA in the last 168 hours. Coagulation Profile: No results for input(s): INR, PROTIME in the last 168 hours. Cardiac Enzymes: No results for input(s): CKTOTAL, CKMB, CKMBINDEX, TROPONINI in the last 168 hours. BNP (last 3 results) No results for input(s): PROBNP in the last 8760 hours. HbA1C: No results for input(s): HGBA1C in the last 72 hours. CBG: Recent Labs  Lab 09/02/19 1155 09/02/19 1616 09/02/19 2117 09/03/19 0623 09/03/19 1144  GLUCAP 230* 104* 148* 120* 138*   Lipid Profile: No results for input(s): CHOL, HDL, LDLCALC, TRIG, CHOLHDL, LDLDIRECT in the last 72 hours. Thyroid Function Tests: No results for input(s): TSH, T4TOTAL, FREET4, T3FREE, THYROIDAB in the last 72 hours. Anemia Panel: No results for input(s): VITAMINB12, FOLATE, FERRITIN, TIBC, IRON, RETICCTPCT in the last 72 hours. Sepsis Labs: No results for input(s): PROCALCITON, LATICACIDVEN in the last 168 hours.  No results found for this or  any previous visit (from the past 240 hour(s)).       Radiology Studies: No results found.      Scheduled Meds: .  Chlorhexidine Gluconate Cloth  6 each Topical Daily  . insulin aspart  0-15 Units Subcutaneous TID WC  . insulin aspart  0-5 Units Subcutaneous QHS  . magnesium oxide  400 mg Oral BID  . Melatonin  6 mg Oral Once  . metoprolol tartrate  12.5 mg Oral BID  . ondansetron  4 mg Intravenous Once  . Ensure Max Protein  11 oz Oral BID  . sodium chloride flush  10-40 mL Intracatheter Q12H   Continuous Infusions: . sodium chloride 1,000 mL (08/24/19 2247)  . lactated ringers 10 mL/hr at 08/10/19 0845  . nafcillin IV 2 g (09/03/19 1315)     LOS: 33 days    Time spent: 20 mins. More than 50% of that time was spent in counseling and/or coordination of care.      Jae DireJared E Malisa Ruggiero, DO [facility] Pager [phone]  If 7PM-7AM, please contact night-coverage [email or web address] Password [email or web address] 09/03/2019, 1:37 PM

## 2019-09-03 NOTE — Progress Notes (Signed)
Physical Therapy Treatment Patient Details Name: William [name] MRN: [id number] DOB: [birth year] Today's Date: 09/03/2019    History of Present Illness Patient is a 38 y/o male who presents with lower back pain, left shoulder pain, right foot and heel pain. Found to have right heel abscess s/p I&D 9/4, left upper chest wall abscess and mass s/p I&D and wound vac application 9/3, new onset DM, sepsis with MSSA bacteremia due to IV drug use. CT scan- left anterior chest wall soft tissue and intramuscular infection with extension into the thoracic cavity, air in the soft tissues of the right axilla adjacent to the right shoulder, multiple small pulmonary nodules. PMH- heroin IV drug use.    PT Comments    Patient received in bed, pleasant but experiencing increased pain today; reports he had been feeling better earlier but is now at the outskirts of window of ibuprofen and pain has increased significantly. Able to perform bed mobility, functional transfers, and gait with RW with S but extended time today, only gait trained 523f due to increased pain this afternoon. Continued working on steps with B railings and min guard, step to pattern. Returned to bed and attempted progression of stretches with single leg to chest, difficulty tolerating due to pain. Education provided on role of ibuprofen in reducing inflammation, as well as role of exercise/activity/mobility in reducing inflammation as well, changing recommendation to OP PT after DC for further work on back and strengthening, also for possible dexamethasone course to painful area of back.  He was left in bed with all needs met, RN present and attending.    Follow Up Recommendations  Supervision for mobility/OOB;Outpatient PT     Equipment Recommendations  Rolling walker with 5" wheels    Recommendations for Other Services       Precautions / Restrictions Precautions Precautions: Fall Precaution Comments: weakness, chest wound vac pulled  08/24/19, probable SI joint dysfunction Restrictions Weight Bearing Restrictions: No    Mobility  Bed Mobility Overal bed mobility: Needs Assistance Bed Mobility: Rolling;Sidelying to Sit;Sit to Sidelying Rolling: Supervision Sidelying to sit: Supervision     Sit to sidelying: Supervision General bed mobility comments: distant S for safety, no physical assist given, extended time due to pain today  Transfers Overall transfer level: Needs assistance Equipment used: Straight cane;1 person hand held assist Transfers: Sit to/from Stand Sit to Stand: From elevated surface;Supervision Stand pivot transfers: Supervision       General transfer comment: extended time due to pain  Ambulation/Gait Ambulation/Gait assistance: Supervision Gait Distance (Feet): 500 Feet Assistive device: Rolling walker (2 wheeled) Gait Pattern/deviations: Step-through pattern;Decreased stride length Gait velocity: decreased   General Gait Details: 5095fwith RW, limited due to pain today   Stairs Stairs: Yes Stairs assistance: Min guard Stair Management: Two rails;Step to pattern;Forwards Number of Stairs: 2 General stair comments: six inch steps   Wheelchair Mobility    Modified Rankin (Stroke Patients Only)       Balance Overall balance assessment: Needs assistance Sitting-balance support: Feet supported;No upper extremity supported Sitting balance-Leahy Scale: Normal     Standing balance support: Bilateral upper extremity supported;During functional activity Standing balance-Leahy Scale: Fair Standing balance comment: min guard for gait with IV pole today, tends to require more support when pain is higher                            Cognition Arousal/Alertness: Awake/alert Behavior During Therapy: [facility] assessed/performed  Overall Cognitive Status: Within Functional Limits for tasks assessed                                        Exercises       General Comments        Pertinent Vitals/Pain Pain Assessment: 0-10 Pain Score: 6  Pain Location: R SIJ with movement Pain Descriptors / Indicators: Grimacing;Guarding;Discomfort;Sore Pain Intervention(s): Limited activity within patient's tolerance;Monitored during session    Home Living                      Prior Function            PT Goals (current goals can now be found in the care plan section) Acute Rehab PT Goals Patient Stated Goal: to get home PT Goal Formulation: With patient Time For Goal Achievement: 08/27/19 Potential to Achieve Goals: Good Progress towards PT goals: Not progressing toward goals - comment(limited by pain today)    Frequency    Min 3X/week      PT Plan Discharge plan needs to be updated    Co-evaluation              AM-PAC PT "6 Clicks" Mobility   Outcome Measure  Help needed turning from your back to your side while in a flat bed without using bedrails?: None Help needed moving from lying on your back to sitting on the side of a flat bed without using bedrails?: A Little Help needed moving to and from a bed to a chair (including a wheelchair)?: A Little Help needed standing up from a chair using your arms (e.g., wheelchair or bedside chair)?: A Little Help needed to walk in hospital room?: A Little Help needed climbing 3-5 steps with a railing? : A Little 6 Click Score: 19    End of Session   Activity Tolerance: Patient tolerated treatment well Patient left: in bed;with call bell/phone within reach;with family/visitor present;with nursing/sitter in room   PT Visit Diagnosis: Muscle weakness (generalized) (M62.81);Pain;Difficulty in walking, not elsewhere classified (R26.2) Pain - Right/Left: Right Pain - part of body: (SI joint)     Time: 3225-6720 PT Time Calculation (min) (ACUTE ONLY): 24 min  Charges:  $Gait Training: 8-22 mins $Self Care/Home Management: 8-22                     Deniece Ree PT,  DPT, CBIS  Supplemental Physical Therapist [place]    Pager [phone] Acute Rehab Office [phone]

## 2019-09-03 NOTE — Progress Notes (Signed)
Nutrition Follow-up  DOCUMENTATION CODES:   Not applicable  INTERVENTION:  Continue Ensure Max po BID, each supplement provides 150 kcal and 30 grams of protein.   Encourage adequate PO intake.  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 Pt with sepsis present on admission with MSSA bacteremia due to IV drug use. Septic pulmonary emboli, Left chest wall abscess, Right heel abscess, Extensive right posterolateral epidural abscess L2-L3, Osteomyelitis of the L5-S1, Posterior epidural abscess L5-S1 S2. Status post incision and drainage and debridement left chest wall on wound VAC9/3. S/p R heel I&D 9/4.9/11 wound debridement and wound VAC change. VAC discontinued 9/25. Currently on nafcillin which will be continued through 10/28.  Meal completion has been 100%. Intake and appetite has been good. Pt currently has Ensure max ordered. Will continue with current orders to aid in increased caloric and protein needs. Labs and medications reviewed.   Diet Order:   Diet Order            Diet regular Room service appropriate? Yes; Fluid consistency: Thin  Diet effective now              EDUCATION NEEDS:   Education needs have been addressed  Skin:  Skin Assessment: Skin Integrity Issues: Skin Integrity Issues:: Incisions Wound Vac: N/A Incisions: L chest  Last BM:  10/4  Height:   Ht Readings from Last 1 Encounters:  08/01/19 '6\' 2"'  (1.88 m)    Weight:   Wt Readings from  Last 1 Encounters:  08/27/19 78.5 kg    Ideal Body Weight:  86.36 kg  BMI:  Body mass index is 22.22 kg/m.  Estimated Nutritional Needs:   Kcal:  2150-2350  Protein:  105-120 grams  Fluid:  >/= 2.1 L/day    Corrin Parker, MS, RD, LDN Pager # [phone] After hours/ weekend pager # [phone]

## 2019-09-04 DIAGNOSIS — L02213 Cutaneous abscess of chest wall: Secondary | ICD-10-CM

## 2019-09-04 LAB — GLUCOSE, CAPILLARY
Glucose-Capillary: 101 mg/dL — ABNORMAL HIGH (ref 70–99)
Glucose-Capillary: 137 mg/dL — ABNORMAL HIGH (ref 70–99)
Glucose-Capillary: 137 mg/dL — ABNORMAL HIGH (ref 70–99)
Glucose-Capillary: 188 mg/dL — ABNORMAL HIGH (ref 70–99)

## 2019-09-04 NOTE — Progress Notes (Signed)
Met w/ pt.  REviewed visitation policy. Pt verbalized understanding of only one visitor per day between 10-8pm; if visitor leaves campus they should not return.  Also discussed multiple technological devices in room - small keyboards, guitar,headphones, miirror along with other items. Discussed safety of equipment and concern over breakage, or dropping or moving them in the event of an emergency. Pt acknowledged the risk. REports due to his long term need for IV therapy, he is "willing to take the risk" and have his devices close by. Explained again that this was at his own risk. He stated he understood.

## 2019-09-04 NOTE — Progress Notes (Signed)
Occupational Therapy Treatment Patient Details Name: William [name] MRN: [id number] DOB: 1981-04-27 Today's Date: 09/04/2019    History of present illness Patient is a 38 y/o male who presents with lower back pain, left shoulder pain, right foot and heel pain. Found to have right heel abscess s/p I&D 9/4, left upper chest wall abscess and mass s/p I&D and wound vac application 9/3, new onset DM, sepsis with MSSA bacteremia due to IV drug use. CT scan- left anterior chest wall soft tissue and intramuscular infection with extension into the thoracic cavity, air in the soft tissues of the right axilla adjacent to the right shoulder, multiple small pulmonary nodules. PMH- heroin IV drug use.   OT comments  Upon arrival pt sitting upright in bed with his mom present. Pt agreeable to OT session. Session focused on general UE ROM HEP. Pt demonstrated improvement in ROM tolerating 100* shoulder flexion (as opposed to 65*) and 90* abduction (compared to 50*). Pt demonstrates tight bilateral trapezoid muscles, educated him on gentle neck stretches to complete in addition to UE HEP. Pt will continue to benefit from skilled OT services to maximize safety and independence with ADL/IADL and functional mobility. Will continue to follow acutely and progress as tolerated.    Follow Up Recommendations  Outpatient OT    Equipment Recommendations  None recommended by OT    Recommendations for Other Services      Precautions / Restrictions Precautions Precautions: Fall Precaution Comments: weakness, chest wound vac pulled 08/24/19, probable SI joint dysfunction Restrictions Weight Bearing Restrictions: No       Mobility Bed Mobility Overal bed mobility: Needs Assistance Bed Mobility: Rolling;Sidelying to Sit;Sit to Sidelying Rolling: Supervision Sidelying to sit: Supervision Supine to sit: Min guard;HOB elevated Sit to supine: Supervision;HOB elevated   General bed mobility comments: increased time  and effort   Transfers                      Balance Overall balance assessment: Needs assistance Sitting-balance support: Feet supported;No upper extremity supported Sitting balance-Leahy Scale: Normal Sitting balance - Comments: Requires BUE support as position of comfort.                                   ADL either performed or assessed with clinical judgement   ADL Overall ADL's : Needs assistance/impaired     Grooming: Minimal assistance;Sitting Grooming Details (indicate cue type and reason): to reach head comfortably Upper Body Bathing: Set up;Sitting           Lower Body Dressing: Min guard;Sitting/lateral leans Lower Body Dressing Details (indicate cue type and reason): reports increased time and effort Toilet Transfer: Min guard           Functional mobility during ADLs: Min guard;Rolling walker General ADL Comments: reports increased time and effort for ADL completion      Vision       Perception     Praxis      Cognition Arousal/Alertness: Awake/alert Behavior During Therapy: WFL for tasks assessed/performed Overall Cognitive Status: Within Functional Limits for tasks assessed                                          Exercises Exercises: Other exercises Other Exercises Other Exercises: shoulder flexion, shoulder abduction in AAROM with  towel/SPC x10 reps Other Exercises: educated and pt returned demonstrated using L UE to assist R UE into shld flexion and horizontal abduction/adduction Other Exercises: completed scapular retraction x 10 reps with 5 sec hold Other Exercises: Gentle neck stretches, both sides hold for 10seconds 3x daily    Shoulder Instructions       General Comments      Pertinent Vitals/ Pain       Pain Assessment: Faces Faces Pain Scale: Hurts little more Pain Location: R shoulder with movement;SI while sitting  Pain Descriptors / Indicators:  Grimacing;Guarding;Discomfort;Sore Pain Intervention(s): Limited activity within patient's tolerance;Monitored during session;Repositioned  Home Living                                          Prior Functioning/Environment              Frequency  Min 2X/week        Progress Toward Goals  OT Goals(current goals can now be found in the care plan section)  Progress towards OT goals: Progressing toward goals  Acute Rehab OT Goals Patient Stated Goal: to have full ROm in BUE  OT Goal Formulation: With patient Time For Goal Achievement: 09/14/19 Potential to Achieve Goals: Good ADL Goals Pt/caregiver will Perform Home Exercise Program: Right Upper extremity;Increased strength;Increased ROM;With written HEP provided Additional ADL Goal #1: Pt will increase to independence with OOB ADL with RUE pain <2/10.  Plan Discharge plan remains appropriate    Co-evaluation                 AM-PAC OT "6 Clicks" Daily Activity     Outcome Measure   Help from another person eating meals?: None Help from another person taking care of personal grooming?: None Help from another person toileting, which includes using toliet, bedpan, or urinal?: None Help from another person bathing (including washing, rinsing, drying)?: A Little Help from another person to put on and taking off regular upper body clothing?: None Help from another person to put on and taking off regular lower body clothing?: A Little 6 Click Score: 22    End of Session Equipment Utilized During Treatment: Gait belt  OT Visit Diagnosis: Unsteadiness on feet (R26.81);Muscle weakness (generalized) (M62.81)   Activity Tolerance Patient tolerated treatment well   Patient Left in bed;with call bell/phone within reach   Nurse Communication Mobility status        Time: 0762-2633 OT Time Calculation (min): 42 min  Charges: OT General Charges $OT Visit: 1 Visit OT Treatments $Self Care/Home  Management : 8-22 mins $Therapeutic Activity: 8-22 mins $Therapeutic Exercise: 8-22 mins  William [name] OTR/L Acute Rehabilitation Services Office: [place] 09/04/2019, 3:27 PM

## 2019-09-04 NOTE — Progress Notes (Signed)
PROGRESS NOTE    William [name] L Flammia  NFA:[id number] DOB: 01-18-1981 DOA: 07/31/2019 PCP: Patient, No Pcp Per   Brief Narrative:  This is a very pleasant 38 year old male with history of IV drug abuse who was initially admitted with multiple complaints including lower back pain, left shoulder pain, right foot/heel pain.  Imaging done on presentation showed  right heel abscess, left upper chest wall abscess and mass.  CT scan showed left anterior chest wall soft tissue and intramuscular infection with extension into thoracic cavity, air in the soft tissue of the right axillary region to the right shoulder, multiple long nodules.  Patient was initially admitted to ICU.  He underwent surgical debridement of the chest wall abscess and wound VAC placement bu CTCS,wound vac  removed on 9/25.  He was also found to have new onset diabetes here.  MRI had shown multiple epidural and paraspinal abscesses.  Blood cultures/wound cultures showed MSSA.  ID was initially following.  Started on nafcillin via midline.  Last day of antibiotics is 10/28 as per ID who recommended 8 weeks of antibiotics starting from 07/31/2019.  Anticipate inpatient hospitalization until he completes antibiotic therapy.  Assessment & Plan:   Principal Problem:   Staphylococcus aureus bacteremia Active Problems:   Sepsis (HCC)   Abscess of paraspinous muscles   Osteomyelitis of lumbar spine (HCC)   Epidural abscess of spine due to infective embolism   Abnormal chest CT   Septic embolism (HCC)   Community acquired pneumonia   IV drug abuse (HCC)   Heroin abuse (HCC)   Elevated troponin   UTI (urinary tract infection)   Renal failure   Hyperglycemia   Suspected endocarditis   Chest wall abscess   Abscess of right heel   Acute osteomyelitis of lumbar spine (HCC)   Sepsis secondary to MSSA bacteremia/chest wall abscess/septic pulmonary emboli secondary to IV drug abuse (heroin): Sepsis resolved. Stable  Presented with multiple  abscesses as above.  Underwent incision and drainage and debridement of left chest wall with wound VAC and is being followed by CTCS. Status post bedside I&D right heel.  Wound culture, blood cultures showed MSSA.  Also had multiple epidural abscess/discitis was seen by neurosurgery, no recommendation for surgery.  Intraoperative TEE did not show any evidence of endocarditis.   Currently on nafcillin which will be continued through 10/28 total of 8 weeks starting from 07/31/2019  Loose bowel movements Colace discontinued  -Monitor for constipation in setting of opiates  Abdominal discomfort/generalized body ache: resolved.  Continue muscle relaxants/NSAIDs.  Right shoulder septic bursitis with limited right shoulder ROM: Improving.  Improving with PT.  CT (9/10) showed septic bursitis without evidence of  Osteomyelitis/septic arthitis.  Orthopedics was following.  Status post arthrocentesis which were suggestive of septic bursitis.  Plan is to continue IV antibiotics.  Following physical therapy with some improvement in ROM.   Low back pain in setting of infectious findings as above.  Has been present for quite some time pain.  Significant improvement with physical therapy and NSAIDs no improvement with narcotics. oxocodone scheduled for every 4 hours as needed but patient taking twice daily without much improvement and is ok with weaning off - Oxycodone twice daily as needed for now and titrate down as appropriate - continue NSAID and PT  New onset diabetes mellitus: Hemoglobin A1c of 7.8.  Continue current regimen.  Anti-hyperglycemics can be changed to oral on discharge.   Hypokalemia: Resolved   Normocytic normochromic anemia: monitor  IV drug abuse:  Counseled for cessation.  Patient is motivated to quit.  We will consider communicating with IM teaching service  when he is closer to the discharge so that he can follow-up as an outpatient internal medicine  service/addiction clinic  Nutrition  Problem: Increased nutrient needs Etiology: wound healing      DVT prophylaxis:Lovenox Code Status: Full Disposition Plan: Home after completion of antibiotic therapy   Consultants: PCCM, CTCS, neurosurgery, ID  Procedures: Chest wall abscess incision/drainage right heel incision/drainage  Antimicrobials:  Anti-infectives (From admission, onward)   Start     Dose/Rate Route Frequency Ordered Stop   08/10/19 0936  vancomycin (VANCOCIN) 1,000 mg in sodium chloride 0.9 % 1,000 mL irrigation  Status:  Discontinued       As needed 08/10/19 0937 08/10/19 1121   08/10/19 0600  ceFAZolin (ANCEF) IVPB 2g/100 mL premix     2 g 200 mL/hr over 30 Minutes Intravenous To Short Stay 08/10/19 0036 08/10/19 0625   08/09/19 1730  ceFAZolin (ANCEF) IVPB 2g/100 mL premix  Status:  Discontinued     2 g 200 mL/hr over 30 Minutes Intravenous 30 min pre-op 08/09/19 1730 08/10/19 0036   08/06/19 1430  nafcillin injection 2 g  Status:  Discontinued     2 g Intravenous Every 4 hours 08/06/19 1310 08/06/19 1359   08/06/19 1430  nafcillin 2 g in sodium chloride 0.9 % 100 mL IVPB     2 g 200 mL/hr over 30 Minutes Intravenous Every 4 hours 08/06/19 1359     08/03/19 0900  fluconazole (DIFLUCAN) tablet 200 mg     200 mg Oral  Once 08/03/19 0842 08/03/19 0917   08/02/19 2200  ceFAZolin (ANCEF) IVPB 2g/100 mL premix  Status:  Discontinued     2 g 200 mL/hr over 30 Minutes Intravenous Every 8 hours 08/02/19 1534 08/06/19 1310   08/02/19 1600  metroNIDAZOLE (FLAGYL) tablet 500 mg  Status:  Discontinued     500 mg Oral Every 8 hours 08/02/19 1534 08/06/19 1454   08/01/19 1200  vancomycin (VANCOCIN) IVPB 750 mg/150 ml premix  Status:  Discontinued     750 mg 150 mL/hr over 60 Minutes Intravenous Every 8 hours 08/01/19 0224 08/02/19 1533   08/01/19 1130  ceFAZolin (ANCEF) IVPB 2g/100 mL premix     2 g 200 mL/hr over 30 Minutes Intravenous 30 min pre-op 08/01/19 0828 08/01/19 1319   08/01/19 1000  ceFEPIme  (MAXIPIME) 2 g in sodium chloride 0.9 % 100 mL IVPB  Status:  Discontinued     2 g 200 mL/hr over 30 Minutes Intravenous Every 8 hours 08/01/19 0224 08/02/19 1533   08/01/19 0100  ceFEPIme (MAXIPIME) 2 g in sodium chloride 0.9 % 100 mL IVPB     2 g 200 mL/hr over 30 Minutes Intravenous  Once 08/01/19 0056 08/01/19 0335   08/01/19 0100  metroNIDAZOLE (FLAGYL) IVPB 500 mg     500 mg 100 mL/hr over 60 Minutes Intravenous  Once 08/01/19 0056 08/01/19 0335   08/01/19 0100  vancomycin (VANCOCIN) IVPB 1000 mg/200 mL premix     1,000 mg 200 mL/hr over 60 Minutes Intravenous  Once 08/01/19 0056 08/01/19 0335      Subjective:  Patient seen and examined at bedside no acute distress and resting comfortably.  No events overnight.  Tolerating diet.  Denies any chest pain, shortness of breath, fever, nausea, vomiting, urinary complaints.  Admits to having bowel movement.  Otherwise ROS negative   Objective: Vitals:  09/03/19 1557 09/03/19 2035 09/04/19 0735 09/04/19 1112  BP: (!) 138/111 134/83 138/87 137/80  Pulse: 72 73 81 83  Resp: 17 18 18 17   Temp: 99.3 F (37.4 C) 98.7 F (37.1 C) 98.1 F (36.7 C) 98.7 F (37.1 C)  TempSrc: Oral Oral Oral Oral  SpO2: 99% 100% 100% 100%  Weight:      Height:        Intake/Output Summary (Last 24 hours) at 09/04/2019 1346 Last data filed at 09/03/2019 2250 Gross per 24 hour  Intake -  Output 350 ml  Net -350 ml   Filed Weights   08/15/19 0500 08/26/19 0108 08/27/19 0430  Weight: 77.1 kg 78.8 kg 78.5 kg    Examination:   Physical Exam Vitals signs and nursing note reviewed.  Constitutional:      Appearance: Normal appearance.  HENT:     Head: Normocephalic and atraumatic.  Eyes:     Extraocular Movements: Extraocular movements intact.     Conjunctiva/sclera: Conjunctivae normal.  Neck:     Musculoskeletal: Normal range of motion.  Cardiovascular:     Rate and Rhythm: Normal rate and regular rhythm.  Pulmonary:     Effort:  Pulmonary effort is normal.     Breath sounds: Normal breath sounds.  Abdominal:     General: Abdomen is flat.     Palpations: Abdomen is soft.  Musculoskeletal:     Comments: Continues to have limited flexion right shoulder  Neurological:     Mental Status: He is alert. Mental status is at baseline.  Psychiatric:        Mood and Affect: Mood normal.        Behavior: Behavior normal.         Data Reviewed: I have personally reviewed following labs and imaging studies  CBC: Recent Labs  Lab 08/31/19 1730  WBC 4.7  NEUTROABS 3.6  HGB 10.0*  HCT 31.4*  MCV 94.9  PLT 445*   Basic Metabolic Panel: Recent Labs  Lab 08/31/19 0748  NA 141  K 3.7  CL 104  CO2 25  GLUCOSE 115*  BUN 14  CREATININE 0.76  CALCIUM 8.7*   GFR: Estimated Creatinine Clearance: 139 mL/min (by C-G formula based on SCr of 0.76 mg/dL). Liver Function Tests: Recent Labs  Lab 08/31/19 0748  AST 15  ALT 12  ALKPHOS 90  BILITOT 1.2  PROT 6.5  ALBUMIN 2.0*   No results for input(s): LIPASE, AMYLASE in the last 168 hours. No results for input(s): AMMONIA in the last 168 hours. Coagulation Profile: No results for input(s): INR, PROTIME in the last 168 hours. Cardiac Enzymes: No results for input(s): CKTOTAL, CKMB, CKMBINDEX, TROPONINI in the last 168 hours. BNP (last 3 results) No results for input(s): PROBNP in the last 8760 hours. HbA1C: No results for input(s): HGBA1C in the last 72 hours. CBG: Recent Labs  Lab 09/03/19 1144 09/03/19 1559 09/03/19 2151 09/04/19 0631 09/04/19 1108  GLUCAP 138* 89 105* 101* 137*   Lipid Profile: No results for input(s): CHOL, HDL, LDLCALC, TRIG, CHOLHDL, LDLDIRECT in the last 72 hours. Thyroid Function Tests: No results for input(s): TSH, T4TOTAL, FREET4, T3FREE, THYROIDAB in the last 72 hours. Anemia Panel: No results for input(s): VITAMINB12, FOLATE, FERRITIN, TIBC, IRON, RETICCTPCT in the last 72 hours. Sepsis Labs: No results for  input(s): PROCALCITON, LATICACIDVEN in the last 168 hours.  No results found for this or any previous visit (from the past 240 hour(s)).  Radiology Studies: No results found.      Scheduled Meds: . Chlorhexidine Gluconate Cloth  6 each Topical Daily  . insulin aspart  0-15 Units Subcutaneous TID WC  . insulin aspart  0-5 Units Subcutaneous QHS  . magnesium oxide  400 mg Oral BID  . Melatonin  6 mg Oral Once  . metoprolol tartrate  12.5 mg Oral BID  . ondansetron  4 mg Intravenous Once  . Ensure Max Protein  11 oz Oral BID  . sodium chloride flush  10-40 mL Intracatheter Q12H   Continuous Infusions: . sodium chloride 1,000 mL (08/24/19 2247)  . lactated ringers 10 mL/hr at 08/10/19 0845  . nafcillin IV 2 g (09/04/19 1251)     LOS: 34 days    Time spent: 15 mins. More than 50% of that time was spent in counseling and/or coordination of care.      Harold Hedge, DO [facility] Pager [phone]  If 7PM-7AM, please contact night-coverage [email or web address] Password [email or web address] 09/04/2019, 1:46 PM

## 2019-09-04 NOTE — Progress Notes (Signed)
      [address]       [place] [address]             [phone]      25 Days Post-Op Procedure(s) (LRB): WOUND DEBRIDEMENT (N/A) WOUND VAC CHANGE (N/A)   Subjective:  No complaints.  States he feels a little stiff, but has been getting relief from Ibuprofen  Objective: Vital signs in last 24 hours: Temp:  [97.6 F (36.4 C)-99.3 F (37.4 C)] 98.1 F (36.7 C) (10/06 0735) Pulse Rate:  [66-85] 81 (10/06 0735) Resp:  [15-20] 18 (10/06 0735) BP: (132-144)/(83-111) 138/87 (10/06 0735) SpO2:  [99 %-100 %] 100 % (10/06 0735)  Intake/Output from previous day: 10/05 0701 - 10/06 0700 In: -  Out: 350 [Urine:350]  General appearance: alert, cooperative and no distress Heart: regular rate and rhythm Lungs: clear to auscultation bilaterally Wound: clean and dry, few mm deep, no purulence noted  Lab Results: No results for input(s): WBC, HGB, HCT, PLT in the last 72 hours. BMET: No results for input(s): NA, K, CL, CO2, GLUCOSE, BUN, CREATININE, CALCIUM in the last 72 hours.  PT/INR: No results for input(s): LABPROT, INR in the last 72 hours. ABG    Component Value Date/Time   PHART 7.482 (H) 08/01/2019 0909   HCO3 31.0 (H) 08/01/2019 0909   TCO2 32 08/01/2019 0909   O2SAT 97.0 08/01/2019 0909   CBG (last 3)  Recent Labs    09/03/19 1559 09/03/19 2151 09/04/19 0631  GLUCAP 89 105* 101*    Assessment/Plan: S/P Procedure(s) (LRB): WOUND DEBRIDEMENT (N/A) WOUND VAC CHANGE (N/A)  1. S/P I/D Mobile Joint infection, wound vac discontinued 9/29.  Wound looks great, no evidence of infection, continue wet to dry dressing changes, care per primary, continue to follow intermittently.   LOS: 34 days    Ellwood Handler 09/04/2019

## 2019-09-05 DIAGNOSIS — B9561 Methicillin susceptible Staphylococcus aureus infection as the cause of diseases classified elsewhere: Secondary | ICD-10-CM

## 2019-09-05 LAB — GLUCOSE, CAPILLARY
Glucose-Capillary: 113 mg/dL — ABNORMAL HIGH (ref 70–99)
Glucose-Capillary: 102 mg/dL — ABNORMAL HIGH (ref 70–99)
Glucose-Capillary: 163 mg/dL — ABNORMAL HIGH (ref 70–99)
Glucose-Capillary: 154 mg/dL — ABNORMAL HIGH (ref 70–99)

## 2019-09-05 MED ORDER — OXYCODONE HCL 5 MG PO TABS
5.0000 mg | ORAL_TABLET | Freq: Every day | ORAL | Status: AC
  Administered 2019-09-05 – 2019-09-10 (×6): 5 mg via ORAL
  Filled 2019-09-05 (×6): qty 1

## 2019-09-05 MED ORDER — OXYCODONE HCL 5 MG PO TABS
2.5000 mg | ORAL_TABLET | Freq: Every day | ORAL | Status: AC
  Administered 2019-09-11 – 2019-09-20 (×10): 2.5 mg via ORAL
  Filled 2019-09-05 (×10): qty 1

## 2019-09-05 MED ORDER — OXYCODONE HCL 5 MG PO TABS
2.5000 mg | ORAL_TABLET | Freq: Every day | ORAL | Status: DC
  Administered 2019-09-06 – 2019-09-14 (×9): 2.5 mg via ORAL
  Filled 2019-09-05 (×9): qty 1

## 2019-09-05 NOTE — TOC Initial Note (Signed)
Transition of Care [facility]) - Initial/Assessment Note    Patient Details  Name: William [name] MRN: [id number] Date of Birth: 1981/11/16  Transition of [facility]) CM/SW Contact:    Geralynn Ochs, [place] Phone Number: 09/05/2019, 4:26 PM  Clinical Narrative:   CSW met with patient to provide substance abuse resources for patient to develop a plan for staying clean after he leaves the hospital. CSW provided resources for substance abuse counseling programs, methadone programs, and copied a flyer for [facility] for needle exchange. Patient discussed how his substance use has always stemmed from his trauma history and he is already seeing a therapist that he started seeing a couple of weeks ago, doing teletherapy sessions. CSW indicated a few of the substance abuse counseling resources that are providing teletherapy sessions specifically for substance abuse, if the patient felt like he needed that. Per patient, he has been getting a lot out of his therapy sessions already and dealing with the trauma is dealing with his substance abuse because he's only used to escape the trauma from his past. Patient says he has tried methadone before and would never do that again, was not interested in seeking out any methadone clinics.   Patient asked CSW about being discharged home on any opiates, as he is being given a small dose of oxycodone currently, and CSW directed patient to ask MD about that.   CSW also discussed outpatient therapy with the patient, as it is currently recommended and may still be recommended in a few weeks when he's ready to discharge. Patient is agreeable to outpatient therapy, says he lives in [place] and would have transportation to get to the office for sessions. CSW told patient that outpatient therapy would be setup closer to his discharge date, and patient understood.                Expected Discharge Plan: OP Rehab Barriers to Discharge: Continued Medical Work up, Active Substance  Use with PICC Line, Inadequate or no insurance   Patient Goals and CMS Choice Patient states their goals for this hospitalization and ongoing recovery are:: to get well and get back home      Expected Discharge Plan and Services Expected Discharge Plan: OP Rehab       Living arrangements for the past 2 months: Single Family Home                                      Prior Living Arrangements/Services Living arrangements for the past 2 months: Single Family Home Lives with:: Self Patient language and need for interpreter reviewed:: No Do you feel safe going back to the place where you live?: Yes      Need for Family Participation in Patient Care: No (Comment) Care giver support system in place?: Yes (comment)   Criminal Activity/Legal Involvement Pertinent to Current Situation/Hospitalization: No - Comment as needed  Activities of Daily Living Home Assistive Devices/Equipment: None ADL Screening (condition at time of admission) Patient's cognitive ability adequate to safely complete daily activities?: Yes Is the patient deaf or have difficulty hearing?: No Does the patient have difficulty seeing, even when wearing glasses/contacts?: No Does the patient have difficulty concentrating, remembering, or making decisions?: No Patient able to express need for assistance with ADLs?: Yes Does the patient have difficulty dressing or bathing?: No Independently performs ADLs?: Yes (appropriate for developmental age) Does the patient have difficulty walking  or climbing stairs?: Yes Weakness of Legs: Both Weakness of Arms/Hands: None  Permission Sought/Granted                  Emotional Assessment Appearance:: Appears stated age Attitude/Demeanor/Rapport: Engaged Affect (typically observed): Pleasant Orientation: : Oriented to Self, Oriented to Place, Oriented to  Time, Oriented to Situation Alcohol / Substance Use: Illicit Drugs Psych Involvement: Yes  (comment)  Admission diagnosis:  Hyperglycemia [R73.9] Chest wall abscess [L02.213] Sepsis due to undetermined organism, with acute renal failure (HCC) [A41.9, R65.20, N17.9] Osteomyelitis of lumbar spine (HCC) [M46.26] Abscess in epidural space of lumbar spine [G06.1] Patient Active Problem List   Diagnosis Date Noted  . Abscess of right heel 08/07/2019  . Acute osteomyelitis of lumbar spine ([place]) 08/07/2019  . Staphylococcus aureus bacteremia 08/02/2019  . Chest wall abscess 08/02/2019  . Sepsis ([place]) 08/01/2019  . Abscess of paraspinous muscles 08/01/2019  . Osteomyelitis of lumbar spine ([place]) 08/01/2019  . Epidural abscess of spine due to infective embolism 08/01/2019  . Abnormal chest CT 08/01/2019  . Septic embolism ([place]) 08/01/2019  . Community acquired pneumonia 08/01/2019  . IV drug abuse ([place]) 08/01/2019  . Heroin abuse ([place]) 08/01/2019  . Elevated troponin 08/01/2019  . UTI (urinary tract infection) 08/01/2019  . Renal failure 08/01/2019  . Hyperglycemia 08/01/2019  . Suspected endocarditis 08/01/2019   PCP:  Patient, No Pcp Per Pharmacy:   [id number] [place] [id number]: [phone]: [phone]    Social Determinants of Health (SDOH) Interventions    Readmission Risk Interventions No flowsheet data found.

## 2019-09-05 NOTE — Progress Notes (Signed)
Physical Therapy Treatment Patient Details Name: William [name] MRN: [id number] DOB: 1981/03/20 Today's Date: 09/05/2019    History of Present Illness Patient is a 38 y/o male who presents with lower back pain, left shoulder pain, right foot and heel pain. Found to have right heel abscess s/p I&D 9/4, left upper chest wall abscess and mass s/p I&D and wound vac application 9/3, new onset DM, sepsis with MSSA bacteremia due to IV drug use. CT scan- left anterior chest wall soft tissue and intramuscular infection with extension into the thoracic cavity, air in the soft tissues of the right axilla adjacent to the right shoulder, multiple small pulmonary nodules. PMH- heroin IV drug use.    PT Comments    Patient received in bed, pleasant and willing to work with therapy, within window of ibuprofen and pain well controlled this morning. Able to perform bed mobility with Mod(I) and extended time, required only S for functional transfers and gait approximately 1059f with S and no device, cues for improving trunk and shoulder rigidity/allowing for normal arm swing during gait. Progressed exercise program, added standing lumbar extensions and version of standing knee to chest flexion with RW, educated to only perform this activity with family present to stabilize RW for safety, also to avoid this activity when having increased pain for safety/to prevent fall. He was left sitting at EOB with all needs met, family present this morning.    Follow Up Recommendations  Supervision for mobility/OOB;Outpatient PT     Equipment Recommendations  Rolling walker with 5" wheels    Recommendations for Other Services       Precautions / Restrictions Precautions Precautions: Fall Precaution Comments: weakness, chest wound vac pulled 08/24/19, probable SI joint dysfunction Restrictions Weight Bearing Restrictions: No    Mobility  Bed Mobility   Bed Mobility: Rolling;Sidelying to Sit Rolling: Modified  independent (Device/Increase time) Sidelying to sit: Modified independent (Device/Increase time)     Sit to sidelying: Modified independent (Device/Increase time) General bed mobility comments: no physical assist given, increased time  Transfers Overall transfer level: Needs assistance Equipment used: Straight cane;1 person hand held assist Transfers: Sit to/from Stand Sit to Stand: Supervision         General transfer comment: extended time, no physical assist given  Ambulation/Gait Ambulation/Gait assistance: Supervision Gait Distance (Feet): 1000 Feet Assistive device: None Gait Pattern/deviations: Step-through pattern;Decreased stride length Gait velocity: decreased   General Gait Details: rigid trunk and shoulders noted, gait tolerance improved today   Stairs             Wheelchair Mobility    Modified Rankin (Stroke Patients Only)       Balance Overall balance assessment: Needs assistance   Sitting balance-Leahy Scale: Normal     Standing balance support: No upper extremity supported;During functional activity Standing balance-Leahy Scale: Good Standing balance comment: depends on pain, when pain is low balance is good, when pain is high balance is fair                            Cognition Arousal/Alertness: Awake/alert Behavior During Therapy: WFL for tasks assessed/performed Overall Cognitive Status: Within Functional Limits for tasks assessed                                        Exercises      General Comments  Pertinent Vitals/Pain Pain Assessment: No/denies pain Pain Score: 0-No pain Faces Pain Scale: No hurt Pain Intervention(s): Limited activity within patient's tolerance;Monitored during session    Home Living                      Prior Function            PT Goals (current goals can now be found in the care plan section) Acute Rehab PT Goals Patient Stated Goal: go home PT  Goal Formulation: With patient Time For Goal Achievement: 08/27/19 Potential to Achieve Goals: Good Progress towards PT goals: Progressing toward goals    Frequency    Min 3X/week      PT Plan Current plan remains appropriate    Co-evaluation              AM-PAC PT "6 Clicks" Mobility   Outcome Measure  Help needed turning from your back to your side while in a flat bed without using bedrails?: None Help needed moving from lying on your back to sitting on the side of a flat bed without using bedrails?: None Help needed moving to and from a bed to a chair (including a wheelchair)?: A Little Help needed standing up from a chair using your arms (e.g., wheelchair or bedside chair)?: A Little Help needed to walk in hospital room?: A Little Help needed climbing 3-5 steps with a railing? : A Little 6 Click Score: 20    End of Session   Activity Tolerance: Patient tolerated treatment well Patient left: in bed;with call bell/phone within reach;with family/visitor present   PT Visit Diagnosis: Muscle weakness (generalized) (M62.81);Pain;Difficulty in walking, not elsewhere classified (R26.2) Pain - Right/Left: Right Pain - part of body: (SI joint)     Time: 2202-5427 PT Time Calculation (min) (ACUTE ONLY): 24 min  Charges:  $Gait Training: 8-22 mins $Therapeutic Exercise: 8-22 mins                     Deniece Ree PT, DPT, CBIS  Supplemental Physical Therapist [place]    Pager [phone] Acute Rehab Office [phone]

## 2019-09-05 NOTE — Progress Notes (Signed)
PROGRESS NOTE    William [name]  CNO:[id number] DOB: Oct 01, 1981 DOA: 07/31/2019 PCP: Patient, No Pcp Per      Brief Narrative:  William [name] is a 38 y.o. M with IVDU who presented with pain in the low back, left shoulder and right foot, progressiing over few days.  In the ER, imaging showed right heel abscess, left upper chest abscess.        Assessment & Plan:  Sepsis from MSSA bacteremia due to IVDU Chest wall abscess Septic pulmonary emboli No new fever.  Back pain improving.  No new pain complaints, no new joint pains. -Continue nafcillin  Septic bursitis right shoulder  Diabetes New onset Glucoses well controlled -Continue SSI  IVDU -Continue telephonic counseling -Taper opiates over next three weeks  Anemia of chronic disease  Hypertension BP well controlled -Continue metoprolol  Other medications -Continue trazodone     MDM and disposition: The below labs and imaging reports were reviewed and summarized above.  Medication management as above.  The patient was admitted with MSSA bacteremia.  He remains inpatient while he continues IV therapy due to high risk of death or readmission if he were to be discharged prematurely.         DVT prophylaxis: Low risk, Padua 3 Code Status: FULL Family Communication: Mother at bedside    Consultants:   CT surgery  Orthopedics  Infectious disease  Neurosurgery  Procedures:      Antimicrobials:   Nafcillin    Subjective: Ankle pain resolved.  Has some low back pain, which improves with ibuprofen.  He has no fever, shakes, nausea, anxiety, diarrhea.  Objective: Vitals:   09/04/19 1934 09/05/19 0728 09/05/19 1156 09/05/19 1652  BP: (!) 144/93 (!) 141/86 138/88 (!) 143/83  Pulse: 83 87 67 77  Resp: 17 18 18 18   Temp:  98.5 F (36.9 C) 98.6 F (37 C) 98.5 F (36.9 C)  TempSrc: Oral Oral Oral Oral  SpO2: 100% 100% 100% 100%  Weight:      Height:        Intake/Output Summary (Last 24  hours) at 09/05/2019 1824 Last data filed at 09/05/2019 0800 Gross per 24 hour  Intake 500 ml  Output 1575 ml  Net -1075 ml   Filed Weights   08/15/19 0500 08/26/19 0108 08/27/19 0430  Weight: 77.1 kg 78.8 kg 78.5 kg    Examination: General appearance: thin adult male, alert and in no acute distress.   HEENT: Anicteric, conjunctiva pink, lids and lashes normal. No nasal deformity, discharge, epistaxis.  Lips moist.   Skin: Warm and dry.  no jaundice.  No suspicious rashes or lesions. Cardiac: RRR, nl S1-S2, no murmurs appreciated.  Capillary refill is brisk.  JVP normal.  No LE edema.  Radial pulses 2+ and symmetric. Respiratory: Normal respiratory rate and rhythm.  CTAB without rales or wheezes. Abdomen: Abdomen soft.  No TTP. No ascites, distension, hepatosplenomegaly.   MSK: No deformities or effusions. Neuro: Awake and alert.  EOMI, moves all extremities. Speech fluent.    Psych: Sensorium intact and responding to questions, attention normal. Affect normal.  Judgment and insight appear normal.    Data Reviewed: I have personally reviewed following labs and imaging studies:  CBC: Recent Labs  Lab 08/31/19 1730  WBC 4.7  NEUTROABS 3.6  HGB 10.0*  HCT 31.4*  MCV 94.9  PLT 294*   Basic Metabolic Panel: Recent Labs  Lab 08/31/19 0748  NA 141  K 3.7  CL 104  CO2 25  GLUCOSE 115*  BUN 14  CREATININE 0.76  CALCIUM 8.7*   GFR: Estimated Creatinine Clearance: 139 mL/min (by C-G formula based on SCr of 0.76 mg/dL). Liver Function Tests: Recent Labs  Lab 08/31/19 0748  AST 15  ALT 12  ALKPHOS 90  BILITOT 1.2  PROT 6.5  ALBUMIN 2.0*   No results for input(s): LIPASE, AMYLASE in the last 168 hours. No results for input(s): AMMONIA in the last 168 hours. Coagulation Profile: No results for input(s): INR, PROTIME in the last 168 hours. Cardiac Enzymes: No results for input(s): CKTOTAL, CKMB, CKMBINDEX, TROPONINI in the last 168 hours. BNP (last 3 results) No  results for input(s): PROBNP in the last 8760 hours. HbA1C: No results for input(s): HGBA1C in the last 72 hours. CBG: Recent Labs  Lab 09/04/19 1648 09/04/19 2141 09/05/19 0615 09/05/19 1155 09/05/19 1649  GLUCAP 137* 188* 113* 102* 163*   Lipid Profile: No results for input(s): CHOL, HDL, LDLCALC, TRIG, CHOLHDL, LDLDIRECT in the last 72 hours. Thyroid Function Tests: No results for input(s): TSH, T4TOTAL, FREET4, T3FREE, THYROIDAB in the last 72 hours. Anemia Panel: No results for input(s): VITAMINB12, FOLATE, FERRITIN, TIBC, IRON, RETICCTPCT in the last 72 hours. Urine analysis:    Component Value Date/Time   COLORURINE YELLOW 08/09/2019 1845   APPEARANCEUR CLEAR 08/09/2019 1845   LABSPEC 1.029 08/09/2019 1845   PHURINE 5.0 08/09/2019 1845   GLUCOSEU 50 (A) 08/09/2019 1845   HGBUR MODERATE (A) 08/09/2019 1845   BILIRUBINUR NEGATIVE 08/09/2019 1845   KETONESUR NEGATIVE 08/09/2019 1845   PROTEINUR 30 (A) 08/09/2019 1845   NITRITE NEGATIVE 08/09/2019 1845   LEUKOCYTESUR NEGATIVE 08/09/2019 1845   Sepsis Labs: @LABRCNTIP (procalcitonin:4,lacticacidven:4)  )No results found for this or any previous visit (from the past 240 hour(s)).       Radiology Studies: No results found.      Scheduled Meds: . Chlorhexidine Gluconate Cloth  6 each Topical Daily  . insulin aspart  0-15 Units Subcutaneous TID WC  . insulin aspart  0-5 Units Subcutaneous QHS  . magnesium oxide  400 mg Oral BID  . Melatonin  6 mg Oral Once  . metoprolol tartrate  12.5 mg Oral BID  . ondansetron  4 mg Intravenous Once  . oxyCODONE  5 mg Oral QHS   Followed by  . [START ON 09/11/2019] oxyCODONE  2.5 mg Oral QHS  . [START ON 09/06/2019] oxyCODONE  2.5 mg Oral Daily  . Ensure Max Protein  11 oz Oral BID  . sodium chloride flush  10-40 mL Intracatheter Q12H   Continuous Infusions: . sodium chloride 1,000 mL (08/24/19 2247)  . lactated ringers 10 mL/hr at 08/10/19 0845  . nafcillin IV 2 g  (09/05/19 1750)     LOS: 35 days    Time spent: 25 minutes    11/05/19, MD [facility] 09/05/2019, 6:24 PM     Please page through AMION:  [email or web address] Password [email or web address] If 7PM-7AM, please contact night-coverage

## 2019-09-05 NOTE — Progress Notes (Signed)
PT Cancellation Note  Patient Details Name: HANI CAMPUSANO MRN: [id number] DOB: 10-02-1981   Cancelled Treatment:    Reason Eval/Treat Not Completed: Other (comment) patient eating, very motivated to participate in PT and requesting PT come back. Will try to return if time/schedule allow.    Deniece Ree PT, DPT, CBIS  Supplemental Physical Therapist [facility]    Pager [phone] Acute Rehab Office [phone]

## 2019-09-06 LAB — GLUCOSE, CAPILLARY
Glucose-Capillary: 103 mg/dL — ABNORMAL HIGH (ref 70–99)
Glucose-Capillary: 149 mg/dL — ABNORMAL HIGH (ref 70–99)
Glucose-Capillary: 112 mg/dL — ABNORMAL HIGH (ref 70–99)

## 2019-09-06 NOTE — Progress Notes (Signed)
PROGRESS NOTE    [name]  RUE:[id number] DOB: [birth year] DOA: 07/31/2019 PCP: Patient, No Pcp Per      Brief Narrative:  William [name] is a 38 y.o. M with IVDU who presented with pain in the low back, left shoulder and right foot, as well as flu-like symptoms, progressing over a few days.  In the ER, imaging showed right heel abscess, left upper chest abscess and septic emboli.  Blood cultures obtained, started on empiric antibiotics, and admitted for presumptive endocarditis and for chest wall debridement.       Assessment & Plan:  Sepsis from MSSA bacteremia due to IVDU Chest wall abscess Septic pulmonary emboli No fever, back pain resolved, no new pain complaints.  No new joint pains.  He has peeling of his palms.   -Continue nafcillin  Septic bursitis right shoulder  Diabetes Glucose 700 at admission.  No previous diagnosis of diabetes.  No family history of diabetes.  Not obese.  A1c here 7.8%.  Glucoses normal now on minimal SSI. I suspect he will likely NOT need metformin or other anti-glycemic's at discharge. -Continue SSI  IVDU -Continue telephonic counseling -Continue taper opiates over next three weeks  Anemia of chronic disease  Hypertension BP normal -Continue metoprolol  Other medications -Continue trazodone     MDM and disposition: The below labs and imaging reports reviewed and summarized above.  Medication management as above.  The patient was admitted with MSSA bacteremia, septic pulmonary emboli, large chest wall abscess, right shoulder septic arthritis, and right ankle abscess.  These of all been drained, he has been on appropriate antibiotic therapy for 3 weeks, his clinical status is improved remarkably.  He will remain inpatient until he completes IV antibiotic therapy due to the high risk of death readmission if he were to be discharged prematurely          DVT prophylaxis: Low risk, Padua 3 Code Status: FULL Family  Communication: Mother at bedside    Consultants:   CT surgery  Orthopedics  Infectious disease  Neurosurgery  Procedures:   9/2 I&D left chest wall abscess with wound vac by Dr. Tyrone SageGerhardt  9/4 I&D right heel abscess [facility] bedside [facility]  9/11 Excision wound debridemnt, wound vac change by Dr. Donata ClayVan Trigt  9/14 right shoulder bursa aspiration by Charma IgoMichael Jeffery, PAC  Antimicrobials:   Nafcillin   Culture data:   9/1 urine culture -- MSSA   9/2 blood culture x2 -- MSSA in 2/2  9/2 wound culture -- MSSA  9/2 chest wall abscess intraop culture -- MSSA  9/3 blood culture x2 -- NG  9/4 heel abscess culture -- MSSA  9/6 blood culture x2 -- NG  9/10 blood culture x2 -- NG  9/14 aspirate shoulder culture -- NG      Subjective: His palms are peeling.  He had no new fever.  He would like to take a shower.  He is got no new joint pains, chest pain, trouble breathing, cough.        Objective: Vitals:   09/05/19 2256 09/06/19 0500 09/06/19 0925 09/06/19 1024  BP: 134/84   (!) 147/96  Pulse: 62  82 70  Resp: 15   17  Temp: 97.9 F (36.6 C)   (!) 97.5 F (36.4 C)  TempSrc: Oral   Oral  SpO2: 100%   100%  Weight:  77.3 kg    Height:        Intake/Output Summary (Last 24 hours)  at 09/06/2019 1211 Last data filed at 09/06/2019 0024 Gross per 24 hour  Intake 1700 ml  Output -  Net 1700 ml   Filed Weights   08/26/19 0108 08/27/19 0430 09/06/19 0500  Weight: 78.8 kg 78.5 kg 77.3 kg    Examination: General appearance: Adult male, no acute distress, interactive, lying in bed. HEENT: Anicteric, conjunctiva pink, lids and lashes normal. No nasal deformity, discharge, epistaxis.  Lips moist.   Skin: Dry flaky peeling skin of palms. Cardiac: Regular rate and rhythm, no murmurs, JVP normal, no lower extremity edema. Respiratory: Normal respiratory rate and rhythm, lungs clear without rales or wheezes, good air entry. Abdomen: Abdomen soft.  No  TTP. No ascites, distension, hepatosplenomegaly.   MSK: Normal muscle bulk and tone, no deformities or effusions large joints of the upper lower extremities bilaterally. Neuro: Awake alert, extraocular movements intact, moves all extremities with normal strength and coordination, speech fluent.    Psych: Sensorium intact responding to questions, attention normal, affect normal, judgment insight appear normal.    Data Reviewed: I have personally reviewed following labs and imaging studies:  CBC: Recent Labs  Lab 08/31/19 1730  WBC 4.7  NEUTROABS 3.6  HGB 10.0*  HCT 31.4*  MCV 94.9  PLT 445*   Basic Metabolic Panel: Recent Labs  Lab 08/31/19 0748  NA 141  K 3.7  CL 104  CO2 25  GLUCOSE 115*  BUN 14  CREATININE 0.76  CALCIUM 8.7*   GFR: Estimated Creatinine Clearance: 136.9 mL/min (by C-G formula based on SCr of 0.76 mg/dL). Liver Function Tests: Recent Labs  Lab 08/31/19 0748  AST 15  ALT 12  ALKPHOS 90  BILITOT 1.2  PROT 6.5  ALBUMIN 2.0*   No results for input(s): LIPASE, AMYLASE in the last 168 hours. No results for input(s): AMMONIA in the last 168 hours. Coagulation Profile: No results for input(s): INR, PROTIME in the last 168 hours. Cardiac Enzymes: No results for input(s): CKTOTAL, CKMB, CKMBINDEX, TROPONINI in the last 168 hours. BNP (last 3 results) No results for input(s): PROBNP in the last 8760 hours. HbA1C: No results for input(s): HGBA1C in the last 72 hours. CBG: Recent Labs  Lab 09/05/19 0615 09/05/19 1155 09/05/19 1649 09/05/19 2127 09/06/19 0617  GLUCAP 113* 102* 163* 154* 103*   Lipid Profile: No results for input(s): CHOL, HDL, LDLCALC, TRIG, CHOLHDL, LDLDIRECT in the last 72 hours. Thyroid Function Tests: No results for input(s): TSH, T4TOTAL, FREET4, T3FREE, THYROIDAB in the last 72 hours. Anemia Panel: No results for input(s): VITAMINB12, FOLATE, FERRITIN, TIBC, IRON, RETICCTPCT in the last 72 hours. Urine analysis:     Component Value Date/Time   COLORURINE YELLOW 08/09/2019 1845   APPEARANCEUR CLEAR 08/09/2019 1845   LABSPEC 1.029 08/09/2019 1845   PHURINE 5.0 08/09/2019 1845   GLUCOSEU 50 (A) 08/09/2019 1845   HGBUR MODERATE (A) 08/09/2019 1845   BILIRUBINUR NEGATIVE 08/09/2019 1845   KETONESUR NEGATIVE 08/09/2019 1845   PROTEINUR 30 (A) 08/09/2019 1845   NITRITE NEGATIVE 08/09/2019 1845   LEUKOCYTESUR NEGATIVE 08/09/2019 1845   Sepsis Labs: @LABRCNTIP (procalcitonin:4,lacticacidven:4)  )No results found for this or any previous visit (from the past 240 hour(s)).       Radiology Studies: No results found.      Scheduled Meds: . Chlorhexidine Gluconate Cloth  6 each Topical Daily  . insulin aspart  0-15 Units Subcutaneous TID WC  . insulin aspart  0-5 Units Subcutaneous QHS  . magnesium oxide  400 mg Oral BID  .  Melatonin  6 mg Oral Once  . metoprolol tartrate  12.5 mg Oral BID  . ondansetron  4 mg Intravenous Once  . oxyCODONE  5 mg Oral QHS   Followed by  . [START ON 09/11/2019] oxyCODONE  2.5 mg Oral QHS  . oxyCODONE  2.5 mg Oral Daily  . Ensure Max Protein  11 oz Oral BID  . sodium chloride flush  10-40 mL Intracatheter Q12H   Continuous Infusions: . sodium chloride 1,000 mL (08/24/19 2247)  . lactated ringers 10 mL/hr at 08/10/19 0845  . nafcillin IV 2 g (09/06/19 0931)     LOS: 36 days    Time spent: 15 minutes    Edwin Dada, MD [facility] 09/06/2019, 12:11 PM     Please page through [employer]:  [email or web address] Password [email or web address] If 7PM-7AM, please contact night-coverage

## 2019-09-06 NOTE — Progress Notes (Signed)
      [address]       [place] [address]             [phone]       27 Days Post-Op Procedure(s) (LRB): WOUND DEBRIDEMENT (N/A) WOUND VAC CHANGE (N/A)   Subjective:  No new complaints.  Continues to feel well.  Objective: Vital signs in last 24 hours: Temp:  [97.9 F (36.6 C)-98.6 F (37 C)] 97.9 F (36.6 C) (10/07 2256) Pulse Rate:  [62-77] 62 (10/07 2256) Resp:  [15-18] 15 (10/07 2256) BP: (134-143)/(83-88) 134/84 (10/07 2256) SpO2:  [100 %] 100 % (10/07 2256) Weight:  [77.3 kg] 77.3 kg (10/08 0500)  Intake/Output from previous day: 10/07 0701 - 10/08 0700 In: 2020 [P.O.:320; IV Piggyback:1700] Out: -   General appearance: alert, cooperative and no distress Heart: regular rate and rhythm Lungs: clear to auscultation bilaterally Wound: healing well, no evidence of infection  Lab Results: No results for input(s): WBC, HGB, HCT, PLT in the last 72 hours. BMET: No results for input(s): NA, K, CL, CO2, GLUCOSE, BUN, CREATININE, CALCIUM in the last 72 hours.  PT/INR: No results for input(s): LABPROT, INR in the last 72 hours. ABG    Component Value Date/Time   PHART 7.482 (H) 08/01/2019 0909   HCO3 31.0 (H) 08/01/2019 0909   TCO2 32 08/01/2019 0909   O2SAT 97.0 08/01/2019 0909   CBG (last 3)  Recent Labs    09/05/19 1649 09/05/19 2127 09/06/19 0617  GLUCAP 163* 154* 103*    Assessment/Plan: S/P Procedure(s) (LRB): WOUND DEBRIDEMENT (N/A) WOUND VAC CHANGE (N/A)  1. S/P Debridement with wound vac placement, vac care discontinued on 9/29, have been doing wet to dry dressing with good results... wound continues to be healing well, continue dressing care 2. Dispo- care per primary, will sign off, no follow up needed... ok for wound to be washed with soap and water if okay with primary team for patient to shower   LOS: 36 days    Keaisha Sublette 09/06/2019

## 2019-09-07 LAB — BASIC METABOLIC PANEL
Anion gap: 13 (ref 5–15)
BUN: 11 mg/dL (ref 6–20)
CO2: 23 mmol/L (ref 22–32)
Calcium: 8.6 mg/dL — ABNORMAL LOW (ref 8.9–10.3)
Chloride: 104 mmol/L (ref 98–111)
Creatinine, Ser: 0.72 mg/dL (ref 0.61–1.24)
GFR calc Af Amer: >60 mL/min (ref >60)
GFR calc non Af Amer: >60 mL/min (ref >60)
Glucose, Bld: 201 mg/dL — ABNORMAL HIGH (ref 70–99)
Potassium: 3.3 mmol/L — ABNORMAL LOW (ref 3.5–5.1)
Sodium: 140 mmol/L (ref 135–145)

## 2019-09-07 LAB — GLUCOSE, CAPILLARY
Glucose-Capillary: 124 mg/dL — ABNORMAL HIGH (ref 70–99)
Glucose-Capillary: 118 mg/dL — ABNORMAL HIGH (ref 70–99)
Glucose-Capillary: 191 mg/dL — ABNORMAL HIGH (ref 70–99)
Glucose-Capillary: 120 mg/dL — ABNORMAL HIGH (ref 70–99)
Glucose-Capillary: 155 mg/dL — ABNORMAL HIGH (ref 70–99)

## 2019-09-07 LAB — CBC
HCT: 30.6 % — ABNORMAL LOW (ref 39.0–52.0)
Hemoglobin: 10.1 g/dL — ABNORMAL LOW (ref 13.0–17.0)
MCH: 30.1 pg (ref 26.0–34.0)
MCHC: 33 g/dL (ref 30.0–36.0)
MCV: 91.3 fL (ref 80.0–100.0)
Platelets: 373 10*3/uL (ref 150–400)
RBC: 3.35 MIL/uL — ABNORMAL LOW (ref 4.22–5.81)
RDW: 15.2 % (ref 11.5–15.5)
WBC: 4.3 10*3/uL (ref 4.0–10.5)
nRBC: 0 % (ref 0.0–0.2)

## 2019-09-07 NOTE — Progress Notes (Signed)
Physical Therapy Treatment Patient Details Name: William [name] MRN: [id number] DOB: 11-Dec-1980 Today's Date: 09/07/2019    History of Present Illness Patient is a 38 y/o male who presents with lower back pain, left shoulder pain, right foot and heel pain. Found to have right heel abscess s/p I&D 9/4, left upper chest wall abscess and mass s/p I&D and wound vac application 9/3, new onset DM, sepsis with MSSA bacteremia due to IV drug use. CT scan- left anterior chest wall soft tissue and intramuscular infection with extension into the thoracic cavity, air in the soft tissues of the right axilla adjacent to the right shoulder, multiple small pulmonary nodules. PMH- heroin IV drug use.    PT Comments    Pt performed gt training and functional mobility during session.  He pain was well managed and he did not require use of DME this session.  Continued cues for B UE reciprocal armswing.  Pt also performed stair training multiple reps.  Pt tolerated LE exercises and stretching during session utilizing rails on stair trainer for support.  Pt is progressing well and continues to benefit from OPPT follow up.    Follow Up Recommendations  Supervision for mobility/OOB;Outpatient PT     Equipment Recommendations  Rolling walker with 5" wheels    Recommendations for Other Services       Precautions / Restrictions Precautions Precaution Comments: weakness, chest wound vac pulled 08/24/19, probable SI joint dysfunction Restrictions Weight Bearing Restrictions: No    Mobility  Bed Mobility Overal bed mobility: Modified Independent Bed Mobility: Rolling;Sidelying to Sit Rolling: Modified independent (Device/Increase time) Sidelying to sit: Modified independent (Device/Increase time)       General bed mobility comments: no physical assist given, increased time.  Pt even able to control the buttons on bed.  Transfers Overall transfer level: Needs assistance Equipment used: None Transfers: Sit  to/from Stand Sit to Stand: Modified independent (Device/Increase time)         General transfer comment: No assistance need, slow to rise.  Ambulation/Gait Ambulation/Gait assistance: Supervision Gait Distance (Feet): 650 Feet Assistive device: None Gait Pattern/deviations: Step-through pattern;Decreased stride length;Trunk flexed Gait velocity: decreased   General Gait Details: Cues for forward gaze and continued progression for reciprocal armswing.   Stairs Stairs: Yes Stairs assistance: Supervision Stair Management: Two rails;Step to pattern;Forwards Number of Stairs: 14 General stair comments: 6 inch stair and cues for sequencing.   Wheelchair Mobility    Modified Rankin (Stroke Patients Only)       Balance Overall balance assessment: Needs assistance Sitting-balance support: Feet supported;No upper extremity supported Sitting balance-Leahy Scale: Normal       Standing balance-Leahy Scale: Good Standing balance comment: depends on pain, when pain is low balance is good, when pain is high balance is fair                            Cognition Arousal/Alertness: Awake/alert Behavior During Therapy: WFL for tasks assessed/performed Overall Cognitive Status: Within Functional Limits for tasks assessed                                        Exercises Other Exercises Other Exercises: B knee to chest 1x5, standing. Other Exercises: quad stretch 3x15 sec B. Other Exercises: HC stretch 3x15sec B. Other Exercises: Heel raises on stair 1x10 reps.    General Comments  Pertinent Vitals/Pain Pain Assessment: Faces Faces Pain Scale: Hurts a little bit Pain Location: R quad Pain Descriptors / Indicators: Tightness Pain Intervention(s): Monitored during session    Home Living                      Prior Function            PT Goals (current goals can now be found in the care plan section) Acute Rehab PT  Goals Patient Stated Goal: go home PT Goal Formulation: With patient Potential to Achieve Goals: Good Progress towards PT goals: Progressing toward goals    Frequency    Min 3X/week      PT Plan Current plan remains appropriate    Co-evaluation              AM-PAC PT "6 Clicks" Mobility   Outcome Measure  Help needed turning from your back to your side while in a flat bed without using bedrails?: None Help needed moving from lying on your back to sitting on the side of a flat bed without using bedrails?: None Help needed moving to and from a bed to a chair (including a wheelchair)?: A Little Help needed standing up from a chair using your arms (e.g., wheelchair or bedside chair)?: A Little Help needed to walk in hospital room?: A Little Help needed climbing 3-5 steps with a railing? : A Little 6 Click Score: 20    End of Session Equipment Utilized During Treatment: Gait belt Activity Tolerance: Patient tolerated treatment well Patient left: in bed;with call bell/phone within reach;with family/visitor present Nurse Communication: Mobility status PT Visit Diagnosis: Muscle weakness (generalized) (M62.81);Pain;Difficulty in walking, not elsewhere classified (R26.2) Pain - Right/Left: Right Pain - part of body: (SI joint)     Time: 3559-7416 PT Time Calculation (min) (ACUTE ONLY): 31 min  Charges:  $Gait Training: 8-22 mins $Therapeutic Exercise: 8-22 mins                     Joycelyn Rua, PTA Acute Rehabilitation Services Pager [phone] Office [phone]     Angeleah Labrake Artis Delay 09/07/2019, 12:26 PM

## 2019-09-07 NOTE — Progress Notes (Signed)
PROGRESS NOTE    William [name]  WPY:[id number] DOB: December 03, 1980 DOA: 07/31/2019 PCP: Patient, No Pcp Per      Brief Narrative:  William [name] is a 38 y.o. M with IVDU who presented with pain in the low back, left shoulder and right foot, as well as flu-like symptoms, progressing over a few days.  In the ER, imaging showed right heel abscess, left upper chest abscess and septic emboli.  Blood cultures obtained, started on empiric antibiotics, and admitted for presumptive endocarditis and for chest wall debridement.       Assessment & Plan:  Sepsis from MSSA bacteremia due to IVDU Chest wall abscess Septic pulmonary emboli No fever, back pain, new pain complaints, no new joint pains.    -Continue nafcillin -Check weekly CBC/BMP  Septic bursitis right shoulder  Diabetes Glucose 700 at admission.  No previous diagnosis of diabetes.  No family history of diabetes.  Not obese.  A1c here 7.8%.  Glucoses easily controlled with 2-3 units per day. I suspect he will likely NOT need metformin or other anti-glycemic's at discharge. -Continue SSI  IVDU -Continue telephonic counseling -Continue taper opiates over next three weeks  Anemia of chronic disease  Hypertension BP normal -Continue metoprolol  Other medications -Continue trazodone     MDM and disposition: The below labs and imaging reports reviewed and summarized above.  Medication management as above.  The patient was admitted with MSSA bacteremia, septic pulmonary emboli, large chest wall abscess, right shoulder septic arthritis, and right ankle abscess.  These of all been drained, has been appropriate antibiotic therapy for 3/2 weeks, and his clinical status is improving.  He will have to remain inpatient till he completes IV antibiotic therapy          DVT prophylaxis: Low risk, Padua 3 Code Status: FULL Family Communication: Aunt at bedside    Consultants:   CT surgery  Orthopedics  Infectious  disease  Neurosurgery  Procedures:   9/2 I&D left chest wall abscess with wound vac by Dr. Servando Snare  9/4 I&D right heel abscess at bedside by Hilbert Odor, [facility]  9/11 Excision wound debridemnt, wound vac change by Dr. Prescott Gum  9/14 right shoulder bursa aspiration by Silvestre Gunner, PAC  Antimicrobials:   Nafcillin   Culture data:   9/1 urine culture -- MSSA   9/2 blood culture x2 -- MSSA in 2/2  9/2 wound culture -- MSSA  9/2 chest wall abscess intraop culture -- MSSA  9/3 blood culture x2 -- NG  9/4 heel abscess culture -- MSSA  9/6 blood culture x2 -- NG  9/10 blood culture x2 -- NG  9/14 aspirate shoulder culture -- NG      Subjective: Did not sleep well last night.  No new fever.  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  Palm peeling is stable, he thinks is from some cream he put on his feet.  No new fever, joint pain, chest pain, trouble breathing, cough.         Objective: Vitals:   09/07/19 0445 09/07/19 0843 09/07/19 1142 09/07/19 1654  BP:  (!) 138/121 (!) 145/94 (!) 146/95  Pulse:  83 78 77  Resp:  16 16 16   Temp:  97.8 F (36.6 C) 98.6 F (37 C) 99 F (37.2 C)  TempSrc:  Oral Oral Axillary  SpO2:  98% 100% 100%  Weight: 79.5 kg     Height:        Intake/Output Summary (Last 24 hours) at 09/07/2019  1806 Last data filed at 09/07/2019 0845 Gross per 24 hour  Intake 720 ml  Output 500 ml  Net 220 ml   Filed Weights   08/27/19 0430 09/06/19 0500 09/07/19 0445  Weight: 78.5 kg 77.3 kg 79.5 kg    Examination: General appearance: Adult male, lying in bed, no acute distress HEENT: Anicteric, conjunctiva pink, lids and lashes normal. No nasal deformity, discharge, epistaxis.  Lips moist.   Skin: Dry flaky peeling skin of palms. Cardiac: RRR, no murmurs, no lower extremity edema. Respiratory: Normal respiratory rate and rhythm, lungs clear without rales or wheezes. Abdomen: Abdomen soft.  No TTP. No ascites, distension, hepatosplenomegaly.    MSK: Normal muscle bulk and tone, no deformities or effusions large joints of the upper lower extremities bilaterally. Neuro: Awake alert, extraocular movements intact, moves all extremities with normal strength and coordination, speech fluent.    Psych: Sensorium intact responding to questions, attention normal, affect normal, judgment insight appear normal.    Data Reviewed: I have personally reviewed following labs and imaging studies:  CBC: Recent Labs  Lab 09/07/19 1134  WBC 4.3  HGB 10.1*  HCT 30.6*  MCV 91.3  PLT 373   Basic Metabolic Panel: Recent Labs  Lab 09/07/19 1134  NA 140  K 3.3*  CL 104  CO2 23  GLUCOSE 201*  BUN 11  CREATININE 0.72  CALCIUM 8.6*   GFR: Estimated Creatinine Clearance: 140.8 mL/min (by C-G formula based on SCr of 0.72 mg/dL). Liver Function Tests: No results for input(s): AST, ALT, ALKPHOS, BILITOT, PROT, ALBUMIN in the last 168 hours. No results for input(s): LIPASE, AMYLASE in the last 168 hours. No results for input(s): AMMONIA in the last 168 hours. Coagulation Profile: No results for input(s): INR, PROTIME in the last 168 hours. Cardiac Enzymes: No results for input(s): CKTOTAL, CKMB, CKMBINDEX, TROPONINI in the last 168 hours. BNP (last 3 results) No results for input(s): PROBNP in the last 8760 hours. HbA1C: No results for input(s): HGBA1C in the last 72 hours. CBG: Recent Labs  Lab 09/06/19 1611 09/06/19 2104 09/07/19 1030 09/07/19 1145 09/07/19 1651  GLUCAP 112* 124* 118* 191* 120*   Lipid Profile: No results for input(s): CHOL, HDL, LDLCALC, TRIG, CHOLHDL, LDLDIRECT in the last 72 hours. Thyroid Function Tests: No results for input(s): TSH, T4TOTAL, FREET4, T3FREE, THYROIDAB in the last 72 hours. Anemia Panel: No results for input(s): VITAMINB12, FOLATE, FERRITIN, TIBC, IRON, RETICCTPCT in the last 72 hours. Urine analysis:    Component Value Date/Time   COLORURINE YELLOW 08/09/2019 1845   APPEARANCEUR CLEAR  08/09/2019 1845   LABSPEC 1.029 08/09/2019 1845   PHURINE 5.0 08/09/2019 1845   GLUCOSEU 50 (A) 08/09/2019 1845   HGBUR MODERATE (A) 08/09/2019 1845   BILIRUBINUR NEGATIVE 08/09/2019 1845   KETONESUR NEGATIVE 08/09/2019 1845   PROTEINUR 30 (A) 08/09/2019 1845   NITRITE NEGATIVE 08/09/2019 1845   LEUKOCYTESUR NEGATIVE 08/09/2019 1845   Sepsis Labs: @LABRCNTIP (procalcitonin:4,lacticacidven:4)  )No results found for this or any previous visit (from the past 240 hour(s)).       Radiology Studies: No results found.      Scheduled Meds: . Chlorhexidine Gluconate Cloth  6 each Topical Daily  . insulin aspart  0-15 Units Subcutaneous TID WC  . insulin aspart  0-5 Units Subcutaneous QHS  . magnesium oxide  400 mg Oral BID  . Melatonin  6 mg Oral Once  . metoprolol tartrate  12.5 mg Oral BID  . ondansetron  4 mg Intravenous Once  .  oxyCODONE  5 mg Oral QHS   Followed by  . [START ON 09/11/2019] oxyCODONE  2.5 mg Oral QHS  . oxyCODONE  2.5 mg Oral Daily  . Ensure Max Protein  11 oz Oral BID  . sodium chloride flush  10-40 mL Intracatheter Q12H   Continuous Infusions: . sodium chloride 1,000 mL (08/24/19 2247)  . lactated ringers 10 mL/hr at 08/10/19 0845  . nafcillin IV 2 g (09/07/19 1727)     LOS: 37 days    Time spent: 15 minutes   Alberteen Sam, MD [facility] 09/07/2019, 6:06 PM     Please page through AMION:  [email or web address] Password [email or web address] If 7PM-7AM, please contact night-coverage

## 2019-09-08 LAB — GLUCOSE, CAPILLARY
Glucose-Capillary: 119 mg/dL — ABNORMAL HIGH (ref 70–99)
Glucose-Capillary: 101 mg/dL — ABNORMAL HIGH (ref 70–99)
Glucose-Capillary: 149 mg/dL — ABNORMAL HIGH (ref 70–99)
Glucose-Capillary: 121 mg/dL — ABNORMAL HIGH (ref 70–99)

## 2019-09-08 NOTE — Progress Notes (Signed)
PROGRESS NOTE    HUBERT RAATZ  VOP:[id number] DOB: [birth year] DOA: 07/31/2019 PCP: Patient, No Pcp Per      Brief Narrative:  Mr. Sanderfer is a 38 y.o. M with IVDU who presented with pain in the low back, left shoulder and right foot, as well as flu-like symptoms, progressing over a few days.  In the ER, imaging showed right heel abscess, left upper chest abscess and septic emboli.  Blood cultures obtained, started on empiric antibiotics, and admitted for presumptive endocarditis and for chest wall debridement.       Assessment & Plan:  Sepsis from MSSA bacteremia due to IVDU Chest wall abscess Septic pulmonary emboli No fever, back pain, new joint pain.  Creatinine BMP normal from yesterday. -Continue nafcillin -Check weekly CBC/BMP  Septic bursitis right shoulder  Diabetes Glucose 700 at admission.  No previous diagnosis of diabetes.  No family history of diabetes.  Not obese.  A1c here 7.8%.  Glucose controlled here with minimal SSI I suspect he will likely NOT need metformin or other anti-glycemic's at discharge. -Continue SSI  IVDU -Continue telephonic counseling -Continue taper opiates over next three weeks  Anemia of chronic disease  Hypertension BP normal -Continue metoprolol  Other medications -Continue trazodone     MDM and disposition: The below labs imaging reports reviewed and summarized above.  Medication management as above.    The patient was admitted with MSSA bacteremia, septic pulmonary emboli, large chest wall abscess, right shoulder septic arthritis, and right ankle abscess.  These of all been drained, has been appropriate antibiotic therapy for 3/2 weeks, and his clinical status is improving.  He will have to remain inpatient till he completes IV antibiotic therapy          DVT prophylaxis: Low risk, Padua 3 Code Status: FULL Family Communication: Aunt at bedside    Consultants:   CT surgery  Orthopedics  Infectious  disease  Neurosurgery  Procedures:   9/2 I&D left chest wall abscess with wound vac by Dr. Tyrone Sage  9/4 I&D right heel abscess at bedside by Earney Hamburg, [facility]  9/11 Excision wound debridemnt, wound vac change by Dr. Donata Clay  9/14 right shoulder bursa aspiration by Charma Igo, PAC  Antimicrobials:   Nafcillin   Culture data:   9/1 urine culture -- MSSA   9/2 blood culture x2 -- MSSA in 2/2  9/2 wound culture -- MSSA  9/2 chest wall abscess intraop culture -- MSSA  9/3 blood culture x2 -- NG  9/4 heel abscess culture -- MSSA  9/6 blood culture x2 -- NG  9/10 blood culture x2 -- NG  9/14 aspirate shoulder culture -- NG      Subjective: Better last night with cyclobenzaprine.  No no fever.  No new chest, shoulder, ankle, joint, or other pains.      Objective: Vitals:   09/07/19 1654 09/07/19 1944 09/08/19 0004 09/08/19 1212  BP: (!) 146/95 (!) 142/85 122/77 (!) 141/91  Pulse: 77 74 77 70  Resp: 16 18 18    Temp: 99 F (37.2 C) 98.2 F (36.8 C) 98.4 F (36.9 C) (!) 97.4 F (36.3 C)  TempSrc: Axillary Oral Oral Oral  SpO2: 100% 100% 99% 100%  Weight:      Height:        Intake/Output Summary (Last 24 hours) at 09/08/2019 1355 Last data filed at 09/08/2019 1007 Gross per 24 hour  Intake 860 ml  Output 350 ml  Net 510 ml   Filed  Weights   08/27/19 0430 09/06/19 0500 09/07/19 0445  Weight: 78.5 kg 77.3 kg 79.5 kg    Examination: General appearance: Adult male, lying in bed, no acute distress   HEENT:    Skin:   Cardiac: RRR, no murmurs, no lower extremity edema Respiratory: Normal respiratory rate and rhythm, lungs clear without rales or wheezes.   Abdomen:     MSK: Normal muscle bulk and tone, no deformities or effusions or redness of the large joints of the upper lower extremities bilaterally. Neuro: Alert, extraocular movements intact, moves all extremities with normal strength and coordination, speech fluent. Psych: Sensorium  intact responding to questions, attention normal, affect normal, judgment insight appeared normal.    Data Reviewed: I have personally reviewed following labs and imaging studies:  CBC: Recent Labs  Lab 09/07/19 1134  WBC 4.3  HGB 10.1*  HCT 30.6*  MCV 91.3  PLT 998   Basic Metabolic Panel: Recent Labs  Lab 09/07/19 1134  NA 140  K 3.3*  CL 104  CO2 23  GLUCOSE 201*  BUN 11  CREATININE 0.72  CALCIUM 8.6*   GFR: Estimated Creatinine Clearance: 140.8 mL/min (by C-G formula based on SCr of 0.72 mg/dL). Liver Function Tests: No results for input(s): AST, ALT, ALKPHOS, BILITOT, PROT, ALBUMIN in the last 168 hours. No results for input(s): LIPASE, AMYLASE in the last 168 hours. No results for input(s): AMMONIA in the last 168 hours. Coagulation Profile: No results for input(s): INR, PROTIME in the last 168 hours. Cardiac Enzymes: No results for input(s): CKTOTAL, CKMB, CKMBINDEX, TROPONINI in the last 168 hours. BNP (last 3 results) No results for input(s): PROBNP in the last 8760 hours. HbA1C: No results for input(s): HGBA1C in the last 72 hours. CBG: Recent Labs  Lab 09/07/19 1145 09/07/19 1651 09/07/19 2112 09/08/19 0648 09/08/19 1210  GLUCAP 191* 120* 155* 119* 101*   Lipid Profile: No results for input(s): CHOL, HDL, LDLCALC, TRIG, CHOLHDL, LDLDIRECT in the last 72 hours. Thyroid Function Tests: No results for input(s): TSH, T4TOTAL, FREET4, T3FREE, THYROIDAB in the last 72 hours. Anemia Panel: No results for input(s): VITAMINB12, FOLATE, FERRITIN, TIBC, IRON, RETICCTPCT in the last 72 hours. Urine analysis:    Component Value Date/Time   COLORURINE YELLOW 08/09/2019 1845   APPEARANCEUR CLEAR 08/09/2019 1845   LABSPEC 1.029 08/09/2019 1845   PHURINE 5.0 08/09/2019 1845   GLUCOSEU 50 (A) 08/09/2019 1845   HGBUR MODERATE (A) 08/09/2019 1845   BILIRUBINUR NEGATIVE 08/09/2019 1845   KETONESUR NEGATIVE 08/09/2019 1845   PROTEINUR 30 (A) 08/09/2019 1845    NITRITE NEGATIVE 08/09/2019 1845   LEUKOCYTESUR NEGATIVE 08/09/2019 1845   Sepsis Labs: @LABRCNTIP (procalcitonin:4,lacticacidven:4)  )No results found for this or any previous visit (from the past 240 hour(s)).       Radiology Studies: No results found.      Scheduled Meds: . Chlorhexidine Gluconate Cloth  6 each Topical Daily  . insulin aspart  0-15 Units Subcutaneous TID WC  . insulin aspart  0-5 Units Subcutaneous QHS  . magnesium oxide  400 mg Oral BID  . Melatonin  6 mg Oral Once  . metoprolol tartrate  12.5 mg Oral BID  . ondansetron  4 mg Intravenous Once  . oxyCODONE  5 mg Oral QHS   Followed by  . [START ON 09/11/2019] oxyCODONE  2.5 mg Oral QHS  . oxyCODONE  2.5 mg Oral Daily  . Ensure Max Protein  11 oz Oral BID  . sodium chloride flush  10-40 mL Intracatheter Q12H   Continuous Infusions: . sodium chloride 1,000 mL (08/24/19 2247)  . lactated ringers 10 mL/hr at 08/10/19 0845  . nafcillin IV 2 g (09/08/19 1325)     LOS: 38 days    Time spent: 15 minutes  Alberteen Samhristopher P Biannca Scantlin, MD [facility] 09/08/2019, 1:55 PM     Please page through AMION:  [email or web address] Password [email or web address] If 7PM-7AM, please contact night-coverage

## 2019-09-09 ENCOUNTER — Inpatient Hospital Stay (HOSPITAL_COMMUNITY): Payer: BC Managed Care – PPO

## 2019-09-09 DIAGNOSIS — E119 Type 2 diabetes mellitus without complications: Secondary | ICD-10-CM

## 2019-09-09 DIAGNOSIS — F1910 Other psychoactive substance abuse, uncomplicated: Secondary | ICD-10-CM

## 2019-09-09 DIAGNOSIS — L02213 Cutaneous abscess of chest wall: Secondary | ICD-10-CM | POA: Diagnosis not present

## 2019-09-09 DIAGNOSIS — M4626 Osteomyelitis of vertebra, lumbar region: Secondary | ICD-10-CM

## 2019-09-09 DIAGNOSIS — R7881 Bacteremia: Secondary | ICD-10-CM | POA: Diagnosis not present

## 2019-09-09 DIAGNOSIS — A419 Sepsis, unspecified organism: Secondary | ICD-10-CM | POA: Diagnosis not present

## 2019-09-09 DIAGNOSIS — I10 Essential (primary) hypertension: Secondary | ICD-10-CM

## 2019-09-09 LAB — GLUCOSE, CAPILLARY
Glucose-Capillary: 96 mg/dL (ref 70–99)
Glucose-Capillary: 140 mg/dL — ABNORMAL HIGH (ref 70–99)
Glucose-Capillary: 167 mg/dL — ABNORMAL HIGH (ref 70–99)
Glucose-Capillary: 148 mg/dL — ABNORMAL HIGH (ref 70–99)

## 2019-09-09 MED ORDER — GADOBUTROL 1 MMOL/ML IV SOLN
8.0000 mL | Freq: Once | INTRAVENOUS | Status: AC | PRN
  Administered 2019-09-09: 18:00:00 8 mL via INTRAVENOUS

## 2019-09-09 NOTE — Plan of Care (Signed)
  Problem: Activity: Goal: Risk for activity intolerance will decrease Outcome: Adequate for Discharge   

## 2019-09-09 NOTE — Progress Notes (Signed)
PROGRESS NOTE    William [name]  JHE:[id number] DOB: 02-26-1981 DOA: 07/31/2019 PCP: Patient, No Pcp Per      Brief Narrative:  William [name] is a 38 y.o. M with IVDU who presented with pain in the low back, left shoulder and right foot, as well as flu-like symptoms, progressing over a few days.  In the ER, imaging showed right heel abscess, left upper chest abscess and septic emboli.  Blood cultures obtained, started on empiric antibiotics, and admitted for presumptive endocarditis and for chest wall debridement.       Assessment & Plan:  Sepsis from MSSA bacteremia due to IVDU Chest wall abscess Septic pulmonary emboli No new fever, joint pain.  He does have some persistent right SI joint pain, without leukocytosis or fever. -Obtain MRI pelvis -Continue nafcillin -Check weekly CBC/BMP  Septic bursitis right shoulder  Diabetes Glucose 700 at admission.  No previous diagnosis of diabetes.  No family history of diabetes.  Not obese.  A1c here 7.8%.  Glucose controlled here with minimal SSI I suspect he will likely NOT need metformin or other anti-glycemic's at discharge. -Continue SSI  IVDU -Continue telephonic counseling -Continue taper opiates over next three weeks  Anemia of chronic disease  Hypertension BP normal -Continue metoprolol  Other medications -Continue trazodone, Flexeril for insomnia, will need rx at discharge     MDM and disposition: The below labs and imaging reports reviewed and summarized above.  Medication management as above.     The patient was admitted with MSSA bacteremia, septic pulmonary emboli, large chest wall abscess, right shoulder septic arthritis, and right ankle abscess.  These of all been drained, has been appropriate antibiotic therapy for 3/2 weeks, and his clinical status is improving.  He will have to remain inpatient till he completes IV antibiotic therapy          DVT prophylaxis: Low risk, Padua 3 Code Status: FULL  Family Communication: Aunt at bedside    Consultants:   CT surgery  Orthopedics  Infectious disease  Neurosurgery  Procedures:   9/2 I&D left chest wall abscess with wound vac by Dr. Servando Snare  9/4 I&D right heel abscess at bedside by Hilbert Odor, [facility]  9/11 Excision wound debridemnt, wound vac change by Dr. Prescott Gum  9/14 right shoulder bursa aspiration by Silvestre Gunner, PAC  Antimicrobials:   Nafcillin   Culture data:   9/1 urine culture -- MSSA   9/2 blood culture x2 -- MSSA in 2/2  9/2 wound culture -- MSSA  9/2 chest wall abscess intraop culture -- MSSA  9/3 blood culture x2 -- NG  9/4 heel abscess culture -- MSSA  9/6 blood culture x2 -- NG  9/10 blood culture x2 -- NG  9/14 aspirate shoulder culture -- NG      Subjective: Sleep is improved.  No new fever.  His right SI joint pain is worse with movement, improved with ibuprofen or stretching, but it persists for the last several weeks.  No new joint pain.  He thinks it is right humeral head is more prominent.     Objective: Vitals:   09/08/19 2321 09/09/19 0500 09/09/19 0822 09/09/19 1218  BP: 136/86  (!) 132/96 (!) 117/96  Pulse: 71  78 87  Resp: 18  17 16   Temp: (!) 97.5 F (36.4 C)  98.6 F (37 C) 98.6 F (37 C)  TempSrc: Oral     SpO2: 99%  99% 100%  Weight:  80.1 kg  Height:        Intake/Output Summary (Last 24 hours) at 09/09/2019 1328 Last data filed at 09/09/2019 0800 Gross per 24 hour  Intake 1640 ml  Output -  Net 1640 ml   Filed Weights   09/06/19 0500 09/07/19 0445 09/09/19 0500  Weight: 77.3 kg 79.5 kg 80.1 kg    Examination: General appearance: Adult male, lying in bed, no acute distress   HEENT:    Skin:   Cardiac: RRR, no murmurs, no lower extremity edema Respiratory: Normal respiratory rate and rhythm lungs clear without rales or wheezes Abdomen:     MSK: The right shoulder is without redness, effusion.  He has some posterior tenderness, but  no induration.  He indicates pain at the right SI joint. Neuro: Alert and oriented, extraocular movements intact, moves all extremities with normal strength and coordination, gait normal, speech fluent. Psych: Sensorium intact responding to questions, attention normal, affect normal, judgment insight appear normal.    Data Reviewed: I have personally reviewed following labs and imaging studies:  CBC: Recent Labs  Lab 09/07/19 1134  WBC 4.3  HGB 10.1*  HCT 30.6*  MCV 91.3  PLT 373   Basic Metabolic Panel: Recent Labs  Lab 09/07/19 1134  NA 140  K 3.3*  CL 104  CO2 23  GLUCOSE 201*  BUN 11  CREATININE 0.72  CALCIUM 8.6*   GFR: Estimated Creatinine Clearance: 141.8 mL/min (by C-G formula based on SCr of 0.72 mg/dL). Liver Function Tests: No results for input(s): AST, ALT, ALKPHOS, BILITOT, PROT, ALBUMIN in the last 168 hours. No results for input(s): LIPASE, AMYLASE in the last 168 hours. No results for input(s): AMMONIA in the last 168 hours. Coagulation Profile: No results for input(s): INR, PROTIME in the last 168 hours. Cardiac Enzymes: No results for input(s): CKTOTAL, CKMB, CKMBINDEX, TROPONINI in the last 168 hours. BNP (last 3 results) No results for input(s): PROBNP in the last 8760 hours. HbA1C: No results for input(s): HGBA1C in the last 72 hours. CBG: Recent Labs  Lab 09/08/19 1210 09/08/19 1603 09/08/19 2123 09/09/19 0604 09/09/19 1214  GLUCAP 101* 149* 121* 96 140*   Lipid Profile: No results for input(s): CHOL, HDL, LDLCALC, TRIG, CHOLHDL, LDLDIRECT in the last 72 hours. Thyroid Function Tests: No results for input(s): TSH, T4TOTAL, FREET4, T3FREE, THYROIDAB in the last 72 hours. Anemia Panel: No results for input(s): VITAMINB12, FOLATE, FERRITIN, TIBC, IRON, RETICCTPCT in the last 72 hours. Urine analysis:    Component Value Date/Time   COLORURINE YELLOW 08/09/2019 1845   APPEARANCEUR CLEAR 08/09/2019 1845   LABSPEC 1.029 08/09/2019 1845    PHURINE 5.0 08/09/2019 1845   GLUCOSEU 50 (A) 08/09/2019 1845   HGBUR MODERATE (A) 08/09/2019 1845   BILIRUBINUR NEGATIVE 08/09/2019 1845   KETONESUR NEGATIVE 08/09/2019 1845   PROTEINUR 30 (A) 08/09/2019 1845   NITRITE NEGATIVE 08/09/2019 1845   LEUKOCYTESUR NEGATIVE 08/09/2019 1845   Sepsis Labs: @LABRCNTIP (procalcitonin:4,lacticacidven:4)  )No results found for this or any previous visit (from the past 240 hour(s)).       Radiology Studies: No results found.      Scheduled Meds: . Chlorhexidine Gluconate Cloth  6 each Topical Daily  . insulin aspart  0-15 Units Subcutaneous TID WC  . insulin aspart  0-5 Units Subcutaneous QHS  . magnesium oxide  400 mg Oral BID  . Melatonin  6 mg Oral Once  . metoprolol tartrate  12.5 mg Oral BID  . ondansetron  4 mg Intravenous Once  .  oxyCODONE  5 mg Oral QHS   Followed by  . [START ON 09/11/2019] oxyCODONE  2.5 mg Oral QHS  . oxyCODONE  2.5 mg Oral Daily  . Ensure Max Protein  11 oz Oral BID  . sodium chloride flush  10-40 mL Intracatheter Q12H   Continuous Infusions: . sodium chloride 1,000 mL (08/24/19 2247)  . lactated ringers 10 mL/hr at 08/10/19 0845  . nafcillin IV 2 g (09/09/19 1310)     LOS: 39 days    Time spent: 25 minutes  Alberteen Samhristopher P , MD [facility] 09/09/2019, 1:28 PM     Please page through AMION:  [email or web address] Password [email or web address] If 7PM-7AM, please contact night-coverage

## 2019-09-10 LAB — GLUCOSE, CAPILLARY
Glucose-Capillary: 104 mg/dL — ABNORMAL HIGH (ref 70–99)
Glucose-Capillary: 102 mg/dL — ABNORMAL HIGH (ref 70–99)
Glucose-Capillary: 123 mg/dL — ABNORMAL HIGH (ref 70–99)

## 2019-09-10 MED ORDER — ENSURE ENLIVE PO LIQD
237.0000 mL | Freq: Two times a day (BID) | ORAL | Status: DC
  Administered 2019-09-12 – 2019-10-01 (×11): 237 mL via ORAL

## 2019-09-10 NOTE — Progress Notes (Signed)
Physical Therapy Treatment Patient Details Name: William [name] MRN: [id number] DOB: 14-Jul-1981 Today's Date: 09/10/2019    History of Present Illness Patient is a 38 y/o male who presents with lower back pain, left shoulder pain, right foot and heel pain. Found to have right heel abscess s/p I&D 9/4, left upper chest wall abscess and mass s/p I&D and wound vac application 9/3, new onset DM, sepsis with MSSA bacteremia due to IV drug use. CT scan- left anterior chest wall soft tissue and intramuscular infection with extension into the thoracic cavity, air in the soft tissues of the right axilla adjacent to the right shoulder, multiple small pulmonary nodules. PMH- heroin IV drug use.    PT Comments    Patient received up in chair today, reports he is trying to avoid staying in the bed as much as possible now. Able to complete functional transfers with Mod(I), pain well controlled today and tolerated gait approximately 1028f with no device, steady at self selected pace. Continued practicing stairs with forwards ascent and sideways descent to maintain back in neutral position and assist in avoiding exacerbation of pain. Education provided on mechanics for sit to stand from low surfaces, also verbally reviewed exercise program. He was left up in chair with family present, all needs met and questions addressed.     Follow Up Recommendations  Supervision for mobility/OOB;Outpatient PT     Equipment Recommendations  Rolling walker with 5" wheels    Recommendations for Other Services       Precautions / Restrictions Precautions Precautions: Fall Precaution Comments: weakness, chest wound vac pulled 08/24/19, probable SI joint dysfunction Restrictions Weight Bearing Restrictions: No    Mobility  Bed Mobility               General bed mobility comments: OOB in chair  Transfers Overall transfer level: Needs assistance     Sit to Stand: Modified independent (Device/Increase time)         General transfer comment: No assistance need, slow to rise, one hand on cane and other pushing up from [employer] Ambulation/Gait Ambulation/Gait assistance: Supervision Gait Distance (Feet): 1000 Feet Assistive device: None Gait Pattern/deviations: Step-to pattern;Step-through pattern Gait velocity: decreased   General Gait Details: gait pattern continues to improve, tolerated multiple laps around unit without increase in pain   Stairs   Stairs assistance: Supervision Stair Management: One rail Right;Step to pattern;Sideways;Forwards Number of Stairs: 14(x2) General stair comments: forward ascent with R rail, sideways descent with B UEs on rail   Wheelchair Mobility    Modified Rankin (Stroke Patients Only)       Balance Overall balance assessment: Needs assistance Sitting-balance support: Feet supported;No upper extremity supported Sitting balance-Leahy Scale: Normal Sitting balance - Comments: Requires BUE support as position of comfort.   Standing balance support: No upper extremity supported;During functional activity Standing balance-Leahy Scale: Good Standing balance comment: depends on pain, when pain is low balance is good, when pain is high balance is fair                            Cognition Arousal/Alertness: Awake/alert Behavior During Therapy: WFL for tasks assessed/performed Overall Cognitive Status: Within Functional Limits for tasks assessed                                        Exercises  General Comments        Pertinent Vitals/Pain Pain Assessment: No/denies pain Pain Score: 0-No pain Faces Pain Scale: No hurt Pain Intervention(s): Limited activity within patient's tolerance;Monitored during session    Home Living                      Prior Function            PT Goals (current goals can now be found in the care plan section) Acute Rehab PT Goals Patient Stated Goal: go home PT  Goal Formulation: With patient Time For Goal Achievement: 08/27/19 Potential to Achieve Goals: Good Progress towards PT goals: Progressing toward goals    Frequency    Min 3X/week      PT Plan Current plan remains appropriate    Co-evaluation              AM-PAC PT "6 Clicks" Mobility   Outcome Measure  Help needed turning from your back to your side while in a flat bed without using bedrails?: None Help needed moving from lying on your back to sitting on the side of a flat bed without using bedrails?: None Help needed moving to and from a bed to a chair (including a wheelchair)?: A Little Help needed standing up from a chair using your arms (e.g., wheelchair or bedside chair)?: A Little Help needed to walk in hospital room?: A Little Help needed climbing 3-5 steps with a railing? : A Little 6 Click Score: 20    End of Session   Activity Tolerance: Patient tolerated treatment well Patient left: in chair;with call bell/phone within reach;with family/visitor present Nurse Communication: Mobility status PT Visit Diagnosis: Muscle weakness (generalized) (M62.81);Pain;Difficulty in walking, not elsewhere classified (R26.2) Pain - Right/Left: Right Pain - part of body: (SIJ)     Time: 6886-4847 PT Time Calculation (min) (ACUTE ONLY): 25 min  Charges:  $Gait Training: 23-37 mins                     Deniece Ree PT, DPT, CBIS  Supplemental Physical Therapist [place]    Pager [phone] Acute Rehab Office [phone]

## 2019-09-10 NOTE — Progress Notes (Signed)
PROGRESS NOTE    William [name]  FSE:[id number] DOB: 03/25/1981 DOA: 07/31/2019 PCP: Patient, No Pcp Per      Brief Narrative:  William [name] is a 38 y.o. M with IVDU who presented with pain in the low back, left shoulder and right foot, as well as flu-like symptoms, progressing over a few days.  In the ER, imaging showed right heel abscess, left upper chest abscess and septic emboli.  Blood cultures obtained, started on empiric antibiotics, and admitted for presumptive endocarditis and for chest wall debridement.       Assessment & Plan:  Sepsis from MSSA bacteremia due to IVDU Lumbar osteomyelitis/discitis L2-4 Sacral osteomyelitis Epidural abscess No new fever.  Obtained MRI yesterday due to persistent low back, RIGHT "SI joint" pain.  This shows new septic arthritis/osteo at right L4-5 (there had previously been a 2cm abscess adjacent, this is now <1cm), also progressive osteo/discitis at L5-S1 and persistent Osteo of LEFT sacral ala.  Discussed with ID, their preference would be to add on oral therapy after completion of IV therapy.  Discussed with Neurosurgery, they recommend no surgery unless neurologic symptoms are presents (which they are not).   -Continue nafcillin -Check weekly CBC/BMP -Will discuss aspiration with IR -Weekly CRP/ESR   Abscess right ankle  Septic bursitis right shoulder Right shoulder pain and ROM severe.  -Will consult to Orthopedics again  Chest wall abscess s/p operative I&D with wound vac application 9/2 and 4/35 Septic pulmonary emboli Resolving well -Consult to CT surgery, appreciate cares  Diabetes Glucose 700 at admission.  No previous diagnosis of diabetes.  No family history of diabetes.  Not obese.  A1c here 7.8%.  Glucose controlled here with minimal SSI I suspect he will likely NOT need metformin or other anti-glycemic's at discharge. -Continue SSI  IVDU -Continue telephonic counseling -Continue taper opiates over next three  weeks  Anemia of chronic disease  Hypertension BP normal -Continue metoprolol  Other medications -Continue trazodone, Flexeril for insomnia, will need rx at discharge     MDM and disposition: The below labs and imaging reports reviewed and summarized above.  Medication management as above.   The patient was admitted with MSSA bacteremia, septic pulmonary emboli, large chest wall abscess, right shoulder septic arthritis, and right ankle abscess.  These of all been drained, has been appropriate antibiotic therapy for 3/2 weeks, and his clinical status is improving.  He will have to remain inpatient till he completes IV antibiotic therapy          DVT prophylaxis: Low risk, Padua 3 Code Status: FULL Family Communication: Mother    Consultants:   CT surgery  Orthopedics  Infectious disease  Neurosurgery  Procedures:   9/2 I&D left chest wall abscess with wound vac by Dr. Servando Snare  9/4 I&D right heel abscess at bedside by Hilbert Odor, [facility]  9/11 Excision wound debridemnt, wound vac change by Dr. Prescott Gum  9/14 right shoulder bursa aspiration by Silvestre Gunner, PAC  Antimicrobials:   Nafcillin   Culture data:   9/1 urine culture -- MSSA   9/2 blood culture x2 -- MSSA in 2/2  9/2 wound culture -- MSSA  9/2 chest wall abscess intraop culture -- MSSA  9/3 blood culture x2 -- NG  9/4 heel abscess culture -- MSSA  9/6 blood culture x2 -- NG  9/10 blood culture x2 -- NG  9/14 aspirate shoulder culture -- NG      Subjective: His right buttock pain is present.  No leg weakness or pain.  No new fever, joint redness or swelling.        Objective: Vitals:   09/09/19 2353 09/10/19 0819 09/10/19 1204 09/10/19 1658  BP: 129/77 (!) 150/100 131/86 (!) 127/95  Pulse: 66 93 70 69  Resp: _0 Temp: 97.8 F (36.6 C) 97.8 F (36.6 C) 98 F (36.7 C) 98.1 F (36.7 C)  TempSrc: Oral Oral Oral Oral  SpO2: 100% 99% 100% 100%  Weight:       Height:        Intake/Output Summary (Last 24 hours) at 09/10/2019 1818 Last data filed at 09/10/2019 0900 Gross per 24 hour  Intake 220 ml  Output 350 ml  Net -130 ml   Filed Weights   09/06/19 0500 09/07/19 0445 09/09/19 0500  Weight: 77.3 kg 79.5 kg 80.1 kg    Examination: General appearance: Adult male, ambulating in the hall, no acute distress, HEENT:    Skin:   Cardiac: RRR, no murmurs, no lower extremity edema Respiratory: Normal respiratory rate and rhythm, lungs clear without rales or wheezes Abdomen:     MSK: Right shoulder with reduced range of motion, no effusion or redness.  Leg strength normal, sensation intact in the lower extremities. Neuro: Alert and oriented, extraocular movements intact, moves all extremities with normal strength and coordination, gait normal, speech fluent Psych: Sensorium intact responding questions, attention normal, affect normal, judgment insight appear normal      Data Reviewed: I have personally reviewed following labs and imaging studies:  CBC: Recent Labs  Lab 09/07/19 1134  WBC 4.3  HGB 10.1*  HCT 30.6*  MCV 91.3  PLT 947   Basic Metabolic Panel: Recent Labs  Lab 09/07/19 1134  NA 140  K 3.3*  CL 104  CO2 23  GLUCOSE 201*  BUN 11  CREATININE 0.72  CALCIUM 8.6*   GFR: Estimated Creatinine Clearance: 141.8 mL/min (by C-G formula based on SCr of 0.72 mg/dL). Liver Function Tests: No results for input(s): AST, ALT, ALKPHOS, BILITOT, PROT, ALBUMIN in the last 168 hours. No results for input(s): LIPASE, AMYLASE in the last 168 hours. No results for input(s): AMMONIA in the last 168 hours. Coagulation Profile: No results for input(s): INR, PROTIME in the last 168 hours. Cardiac Enzymes: No results for input(s): CKTOTAL, CKMB, CKMBINDEX, TROPONINI in the last 168 hours. BNP (last 3 results) No results for input(s): PROBNP in the last 8760 hours. HbA1C: No results for input(s): HGBA1C in the last 72 hours.  CBG: Recent Labs  Lab 09/09/19 1214 09/09/19 1614 09/09/19 2121 09/10/19 0638 09/10/19 1655  GLUCAP 140* 167* 148* 104* 102*   Lipid Profile: No results for input(s): CHOL, HDL, LDLCALC, TRIG, CHOLHDL, LDLDIRECT in the last 72 hours. Thyroid Function Tests: No results for input(s): TSH, T4TOTAL, FREET4, T3FREE, THYROIDAB in the last 72 hours. Anemia Panel: No results for input(s): VITAMINB12, FOLATE, FERRITIN, TIBC, IRON, RETICCTPCT in the last 72 hours. Urine analysis:    Component Value Date/Time   COLORURINE YELLOW 08/09/2019 1845   APPEARANCEUR CLEAR 08/09/2019 1845   LABSPEC 1.029 08/09/2019 1845   PHURINE 5.0 08/09/2019 1845   GLUCOSEU 50 (A) 08/09/2019 1845   HGBUR MODERATE (A) 08/09/2019 1845   BILIRUBINUR NEGATIVE 08/09/2019 1845   KETONESUR NEGATIVE 08/09/2019 1845   PROTEINUR 30 (A) 08/09/2019 1845   NITRITE NEGATIVE 08/09/2019 1845   LEUKOCYTESUR NEGATIVE 08/09/2019 1845   Sepsis Labs: _1 (procalcitonin:4,lacticacidven:4)  )No results found for this or any previous visit (  from the past 240 hour(s)).       Radiology Studies: Mr Pelvis W Wo Contrast  Result Date: 09/09/2019 CLINICAL DATA:  Right groin and sacroiliac joint pain. History of IV drug use with osteomyelitis discitis and epidural abscess. EXAM: MRI PELVIS WITHOUT AND WITH CONTRAST TECHNIQUE: Multiplanar multisequence MR imaging of the pelvis was performed both before and after administration of intravenous contrast. CONTRAST:  45m GADAVIST GADOBUTROL 1 MMOL/ML IV SOLN COMPARISON:  MRI lumbar spine dated August 01, 2019. FINDINGS: Bones: L5-S1 osteomyelitis discitis again noted with progressive disc height loss and endplate irregularity. Osteomyelitis also involves the left sacral ala, similar to prior MRI. Epidural abscess in the lower lumbar spine has resolved with residual inflammatory changes and phlegmon in the right lateral epidural space at L4-L5 and L5-S1, extending into the right  L5-S1 and S1 neural foramina. New septic arthritis and osteomyelitis of the right L4-L5 facet joint with continued adjacent paravertebral inflammatory changes and interval decrease in size of the now small 0.7 x 0.7 x 3.1 cm abscess communicating with the joint space, previously 2.3 x 2.2 x 4.5 cm. The sacroiliac joints and symphysis pubis are unremarkable. No acute fracture or dislocation. Articular cartilage and labrum Articular cartilage: No focal chondral defect or subchondral signal abnormality identified. Labrum: Grossly intact, although evaluation is limited due to lack of intra-articular fluid. No paralabral abnormality. Joint or bursal effusion Joint effusion: No significant hip joint effusion. Bursae: No focal periarticular fluid collection. Muscles and tendons Muscles and tendons: The visualized gluteus, hamstring and iliopsoas tendons appear normal. No additional muscle edema. No muscle atrophy. Other findings Miscellaneous: The visualized internal pelvic contents appear unremarkable. IMPRESSION: 1. New septic arthritis and osteomyelitis of the right L4-L5 facet joint with continued adjacent paravertebral inflammatory changes, but interval decrease in size of the now small 0.7 x 0.7 x 3.1 cm abscess communicating with the joint space, previously 2.3 x 2.2 x 4.5 cm. 2. Progressive osteomyelitis discitis at L5-S1 with increased disc height loss and endplate irregularity. Unchanged extension of osteomyelitis into the left sacral ala. The sacroiliac joints are unremarkable. 3. Resolved epidural abscess in the visualized lower lumbar spine with residual inflammatory changes and phlegmon in the right lateral epidural space at L4-L5 and L5-S1, extending into the right L5-S1 and S1 neural foramina. Electronically Signed   By: WTitus DubinM.D.   On: 09/09/2019 20:15        Scheduled Meds: . [START ON 09/11/2019] feeding supplement (ENSURE ENLIVE)  237 mL Oral BID BM  . insulin aspart  0-15 Units  Subcutaneous TID WC  . insulin aspart  0-5 Units Subcutaneous QHS  . magnesium oxide  400 mg Oral BID  . Melatonin  6 mg Oral Once  . metoprolol tartrate  12.5 mg Oral BID  . ondansetron  4 mg Intravenous Once  . oxyCODONE  5 mg Oral QHS   Followed by  . [START ON 09/11/2019] oxyCODONE  2.5 mg Oral QHS  . oxyCODONE  2.5 mg Oral Daily  . sodium chloride flush  10-40 mL Intracatheter Q12H   Continuous Infusions: . sodium chloride 1,000 mL (08/24/19 2247)  . lactated ringers 10 mL/hr at 08/10/19 0845  . nafcillin IV 2 g (09/10/19 1803)     LOS: 40 days    Time spent: 25 minutes  CEdwin Dada MD [facility] 09/10/2019, 6:18 PM     Please page through [place]  [email or web address] Password [email or web address] If 7PM-7AM, please contact night-coverage

## 2019-09-10 NOTE — Progress Notes (Signed)
Occupational Therapy Treatment Patient Details Name: William [name] MRN: [id number] DOB: [birth year] Today's Date: 09/10/2019    History of present illness Patient is a 38 y/o male who presents with lower back pain, left shoulder pain, right foot and heel pain. Found to have right heel abscess s/p I&D 9/4, left upper chest wall abscess and mass s/p I&D and wound vac application 9/3, new onset DM, sepsis with MSSA bacteremia due to IV drug use. CT scan- left anterior chest wall soft tissue and intramuscular infection with extension into the thoracic cavity, air in the soft tissues of the right axilla adjacent to the right shoulder, multiple small pulmonary nodules. PMH- heroin IV drug use.   OT comments  Pt progressing toward established goals. 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 Pt's current work station does not support proper posture or ease of use of RUE due to limited shoulder ROM. Educated pt on proper workstation setup. Educated pt on and pt completed additional UE exercises. Pt will continue to benefit from skilled OT services to maximize safety and independence with ADL/IADL and functional mobility. Will continue to follow acutely and progress as tolerated.    Follow Up Recommendations  Outpatient OT    Equipment Recommendations  None recommended by OT    Recommendations for Other Services      Precautions / Restrictions Precautions Precautions: Fall Precaution Comments: weakness, chest wound vac pulled 08/24/19, probable SI joint dysfunction Restrictions Weight Bearing Restrictions: No       Mobility Bed Mobility               General bed mobility comments: OOB in chair  Transfers Overall transfer level: Needs assistance     Sit to Stand: Modified independent (Device/Increase time)         General transfer comment: No assistance need, slow to rise, one hand on cane and other pushing up from dresser    Balance Overall balance  assessment: Needs assistance Sitting-balance support: Feet supported;No upper extremity supported Sitting balance-Leahy Scale: Normal Sitting balance - Comments: able to sit on edge of recliner during UE HEP   Standing balance support: No upper extremity supported;During functional activity Standing balance-Leahy Scale: Good Standing balance comment: depends on pain, when pain is low balance is good, when pain is high balance is fair                           ADL either performed or assessed with clinical judgement   ADL Overall ADL's : Needs assistance/impaired     Grooming: Minimal assistance;Sitting Grooming Details (indicate cue type and reason): to reach head comfortably             Lower Body Dressing: Min guard;Sitting/lateral leans   Toilet Transfer: Min guard             General ADL Comments: pt's workstation not suitable for good posture/limited shoulder ROM; 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      Cognition Arousal/Alertness: Awake/alert Behavior During Therapy: WFL for tasks assessed/performed Overall Cognitive Status: Within Functional Limits for tasks assessed                                          Exercises Exercises: Other  exercises Other Exercises Other Exercises: scapular retraction 10x hold 15 seconds Other Exercises: shoulder flexion to 90* with dowel 10x Other Exercises: R shoulder pendulum 5x Left circle, 5x Right circle   Shoulder Instructions       General Comments      Pertinent Vitals/ Pain       Pain Assessment: Faces Pain Score: 0-No pain Faces Pain Scale: Hurts a little bit Pain Location: R shoulder with exercises Pain Descriptors / Indicators: Grimacing Pain Intervention(s): Limited activity within patient's tolerance;Monitored during session;Ice applied  Home Living                                          Prior  Functioning/Environment              Frequency  Min 2X/week        Progress Toward Goals  OT Goals(current goals can now be found in the care plan section)  Progress towards OT goals: Progressing toward goals  Acute Rehab OT Goals Patient Stated Goal: go home OT Goal Formulation: With patient Time For Goal Achievement: 09/14/19 Potential to Achieve Goals: Good ADL Goals Pt/caregiver will Perform Home Exercise Program: Right Upper extremity;Increased strength;Increased ROM;With written HEP provided Additional ADL Goal #1: Pt will increase to independence with OOB ADL with RUE pain <2/10.  Plan Discharge plan remains appropriate    Co-evaluation                 AM-PAC OT "6 Clicks" Daily Activity     Outcome Measure   Help from another person eating meals?: None Help from another person taking care of personal grooming?: None Help from another person toileting, which includes using toliet, bedpan, or urinal?: None Help from another person bathing (including washing, rinsing, drying)?: A Little Help from another person to put on and taking off regular upper body clothing?: None Help from another person to put on and taking off regular lower body clothing?: A Little 6 Click Score: 22    End of Session    OT Visit Diagnosis: Unsteadiness on feet (R26.81);Muscle weakness (generalized) (M62.81)   Activity Tolerance Patient tolerated treatment well   Patient Left in chair;with call bell/phone within reach   Nurse Communication Mobility status        Time: 1255-1330 OT Time Calculation (min): 35 min  Charges: OT General Charges $OT Visit: 1 Visit OT Treatments $Self Care/Home Management : 8-22 mins $Therapeutic Exercise: 8-22 mins  Dorinda Hill OTR/L Acute Rehabilitation Services Office: [place] 09/10/2019, 1:59 PM

## 2019-09-10 NOTE — Progress Notes (Signed)
Nutrition Follow-up  DOCUMENTATION CODES:   Not applicable  INTERVENTION:  Provide Ensure Enlive po BID, each supplement provides 350 kcal and 20 grams of protein  Encourage adequate PO intake.   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 Pt with sepsis present on admission with MSSA bacteremia due to IV drug use. Septic pulmonary emboli, Left chest wall abscess, Right heel abscess, Extensive right posterolateral epidural abscess L2-L3, Osteomyelitis of the L5-S1, Posterior epidural abscess L5-S1 S2. Status post incision and drainage and debridement left chest wall on wound VAC9/3. S/p R heel I&D 9/4.9/11 wound debridement and wound VAC change. VAC discontinued 9/25.Pt with septic bursitis right shoulder s/p 9/14 right shoulder bursa aspiration. Currently on nafcillin which will be continued through 10/28.  Meal completion has been 100%. Appetite and intake has been good. Pt currently has Ensure Max with varied consumption. Pt prefers Ensure Enlive instead. RD to modify orders. Pt encouraged to eat his food at meals and to drink his supplements.    Labs and medications reviewed.   Diet Order:   Diet Order            Diet regular Room service appropriate? Yes; Fluid consistency: Thin  Diet effective now              EDUCATION NEEDS:   Education needs have been addressed  Skin:  Skin Assessment: Skin Integrity Issues: Skin Integrity Issues:: Incisions Wound Vac:  N/A Incisions: L chest  Last BM:  10/11  Height:   Ht Readings from Last 1 Encounters:  08/01/19 _0  (1.88 m)    Weight:   Wt Readings from Last 1 Encounters:  09/09/19 80.1 kg    Ideal Body Weight:  86.36 kg  BMI:  Body mass index is 22.67 kg/m.  Estimated Nutritional Needs:   Kcal:  2150-2350  Protein:  105-120 grams  Fluid:  >/= 2.1 L/day    Corrin Parker, MS, RD, LDN Pager # [phone] After hours/ weekend pager # [phone]

## 2019-09-11 ENCOUNTER — Inpatient Hospital Stay (HOSPITAL_COMMUNITY): Payer: BC Managed Care – PPO

## 2019-09-11 DIAGNOSIS — M25511 Pain in right shoulder: Secondary | ICD-10-CM | POA: Diagnosis not present

## 2019-09-11 DIAGNOSIS — B9561 Methicillin susceptible Staphylococcus aureus infection as the cause of diseases classified elsewhere: Secondary | ICD-10-CM | POA: Diagnosis not present

## 2019-09-11 DIAGNOSIS — R7881 Bacteremia: Secondary | ICD-10-CM | POA: Diagnosis not present

## 2019-09-11 DIAGNOSIS — M75111 Incomplete rotator cuff tear or rupture of right shoulder, not specified as traumatic: Secondary | ICD-10-CM | POA: Diagnosis not present

## 2019-09-11 DIAGNOSIS — A419 Sepsis, unspecified organism: Secondary | ICD-10-CM | POA: Diagnosis not present

## 2019-09-11 LAB — GLUCOSE, CAPILLARY
Glucose-Capillary: 116 mg/dL — ABNORMAL HIGH (ref 70–99)
Glucose-Capillary: 126 mg/dL — ABNORMAL HIGH (ref 70–99)
Glucose-Capillary: 205 mg/dL — ABNORMAL HIGH (ref 70–99)

## 2019-09-11 MED ORDER — GADOBUTROL 1 MMOL/ML IV SOLN
8.0000 mL | Freq: Once | INTRAVENOUS | Status: AC | PRN
  Administered 2019-09-11: 8 mL via INTRAVENOUS

## 2019-09-11 NOTE — Progress Notes (Signed)
PROGRESS NOTE    William [name]  TML:[id number] DOB: 08-15-1981 DOA: 07/31/2019 PCP: Patient, No Pcp Per      Brief Narrative:  Mr. William [name] is a 38 y.o. M with IVDU who presented with pain in the low back, left shoulder and right foot, as well as flu-like symptoms, progressing over a few days.  In the ER, imaging showed right heel abscess, left upper chest abscess and septic emboli.  Blood cultures obtained, started on empiric antibiotics, and admitted for presumptive endocarditis and for chest wall debridement.       Assessment & Plan:  Sepsis from MSSA bacteremia due to IVDU Lumbar osteomyelitis/discitis L2-4 Sacral osteomyelitis Epidural abscess No new fever.    Obtained repeat MRI pelvis 10/11 due to persistent low back, RIGHT "SI joint" pain.  This shows new septic arthritis/osteo at right L4-5 (there had previously been a 2cm abscess adjacent, this is now <1cm), also progressive osteo/discitis at L5-S1 and persistent Osteo of LEFT sacral ala.  Discussed with ID, their preference would be to add on oral therapy after completion of IV therapy.  Discussed with Neurosurgery, they recommend no surgery unless neurologic symptoms are presents (which they are not).   I have requested an opinion from IR re: aspiration to rule out another microbe, and to clear the fluid collection.  -Continue nafcillin -Obtain weekly CBC/BMP, CRP ESR    Abscess right ankle  Septic bursitis right shoulder Right shoulder pain and ROM limited.  Lacks probably 90 degrees right shoulder abduction, has pain with abduction.  -Will consult to Orthopedics again, appreciate expert cares  Chest wall abscess s/p operative I&D with wound vac application 9/2 and 6/81 Septic pulmonary emboli Resolving well -Consult to CT surgery, appreciate cares  Diabetes Glucose 700 at admission.  No previous diagnosis of diabetes.  No family history of diabetes.  Not obese.  A1c here 7.8%.  Glucose controlled here with  minimal SSI I suspect he will likely NOT need metformin or other anti-glycemic's at discharge. -Continue SSI  IVDU -Continue telephonic counseling -Continue taper opiates over next three weeks  Anemia of chronic disease  Hypertension BP normal -Continue metoprolol  Other medications -Continue trazodone, Flexeril for insomnia, will need rx at discharge     MDM and disposition: The below labs and imaging reports reviewed and summarized above.  Medication management as above.   The patient was admitted with MSSA bacteremia, septic pulmonary emboli, large chest wall abscess, right shoulder septic arthritis, and right ankle abscess.  These of all been drained, has been appropriate antibiotic therapy for 3/2 weeks, and his clinical status is improving.  He will have to remain inpatient till he completes IV antibiotic therapy          DVT prophylaxis: Low risk, Pad[place] 3 Code Status: FULL Family Communication: Mother    Consultants:   CT surgery  Orthopedics  Infectious disease  Neurosurgery  Procedures:   9/2 MRI lumbar spine  9/2 I&D left chest wall abscess with wound vac by Dr. Servando Snare  9/4 I&D right heel abscess at bedside by Hilbert Odor, [facility]  9/11 Excision wound debridemnt, wound vac change by Dr. Prescott Gum  9/14 right shoulder bursa aspiration by Silvestre Gunner, [facility]  10/12 MRI pelvis   10/13 MRI RIGHT shoulder  Antimicrobials:   Nafcillin   Culture data:   9/1 urine culture -- MSSA   9/2 blood culture x2 -- MSSA in 2/2  9/2 wound culture -- MSSA  9/2 chest wall abscess intraop  culture -- MSSA  9/3 blood culture x2 -- NG  9/4 heel abscess culture -- MSSA  9/6 blood culture x2 -- NG  9/10 blood culture x2 -- NG  9/14 aspirate shoulder culture -- NG      Subjective: Right buttocks pain still present, but no change.  No fever, malaise.  No weakness that is new, no redness and swelling of any joints.  He has chronic right  shoulder pain.     Objective: Vitals:   09/11/19 0800 09/11/19 0940 09/11/19 1212 09/11/19 1708  BP: (!) 152/113 (!) 143/94 133/85 129/85  Pulse: 91 78 68 82  Resp: _0 Temp: 98.2 F (36.8 C)  98.2 F (36.8 C) 97.9 F (36.6 C)  TempSrc: Oral  Oral Oral  SpO2: 100%  100% 100%  Weight:      Height:        Intake/Output Summary (Last 24 hours) at 09/11/2019 1849 Last data filed at 09/11/2019 1700 Gross per 24 hour  Intake 650 ml  Output 1600 ml  Net -950 ml   Filed Weights   09/06/19 0500 09/07/19 0445 09/09/19 0500  Weight: 77.3 kg 79.5 kg 80.1 kg    Examination: General appearance: Adult male, sitting in chair, no acute distress. HEENT:    Skin:   Cardiac: RRR, no murmurs, no lower extremity edema. Respiratory: Normal respiratory rate and rhythm, lungs clear without rales or wheezes. Abdomen:     MSK: Right shoulder with reduced range of motion, no effusion or redness.  Gait normal. Neuro: Alert and oriented, extraocular movements intact, moves all extremities with normal strength and coordination, gait normal.  Speech fluent. Psych: Sensorium intact responding to questions, attention normal, affect normal, judgment insight appeared normal.      Data Reviewed: I have personally reviewed following labs and imaging studies:  CBC: Recent Labs  Lab 09/07/19 1134  WBC 4.3  HGB 10.1*  HCT 30.6*  MCV 91.3  PLT 154   Basic Metabolic Panel: Recent Labs  Lab 09/07/19 1134  NA 140  K 3.3*  CL 104  CO2 23  GLUCOSE 201*  BUN 11  CREATININE 0.72  CALCIUM 8.6*   GFR: Estimated Creatinine Clearance: 141.8 mL/min (by C-G formula based on SCr of 0.72 mg/dL). Liver Function Tests: No results for input(s): AST, ALT, ALKPHOS, BILITOT, PROT, ALBUMIN in the last 168 hours. No results for input(s): LIPASE, AMYLASE in the last 168 hours. No results for input(s): AMMONIA in the last 168 hours. Coagulation Profile: No results for input(s): INR, PROTIME in  the last 168 hours. Cardiac Enzymes: No results for input(s): CKTOTAL, CKMB, CKMBINDEX, TROPONINI in the last 168 hours. BNP (last 3 results) No results for input(s): PROBNP in the last 8760 hours. HbA1C: No results for input(s): HGBA1C in the last 72 hours. CBG: Recent Labs  Lab 09/10/19 0638 09/10/19 1655 09/10/19 2110 09/11/19 0655 09/11/19 1705  GLUCAP 104* 102* 123* 116* 126*   Lipid Profile: No results for input(s): CHOL, HDL, LDLCALC, TRIG, CHOLHDL, LDLDIRECT in the last 72 hours. Thyroid Function Tests: No results for input(s): TSH, T4TOTAL, FREET4, T3FREE, THYROIDAB in the last 72 hours. Anemia Panel: No results for input(s): VITAMINB12, FOLATE, FERRITIN, TIBC, IRON, RETICCTPCT in the last 72 hours. Urine analysis:    Component Value Date/Time   COLORURINE YELLOW 08/09/2019 1845   APPEARANCEUR CLEAR 08/09/2019 1845   LABSPEC 1.029 08/09/2019 1845   PHURINE 5.0 08/09/2019 1845   GLUCOSEU 50 (A) 08/09/2019 1845  HGBUR MODERATE (A) 08/09/2019 1845   BILIRUBINUR NEGATIVE 08/09/2019 1845   KETONESUR NEGATIVE 08/09/2019 1845   PROTEINUR 30 (A) 08/09/2019 1845   NITRITE NEGATIVE 08/09/2019 1845   LEUKOCYTESUR NEGATIVE 08/09/2019 1845   Sepsis Labs: _0 (procalcitonin:4,lacticacidven:4)  )No results found for this or any previous visit (from the past 240 hour(s)).       Radiology Studies: No results found.      Scheduled Meds: . feeding supplement (ENSURE ENLIVE)  237 mL Oral BID BM  . insulin aspart  0-15 Units Subcutaneous TID WC  . insulin aspart  0-5 Units Subcutaneous QHS  . magnesium oxide  400 mg Oral BID  . Melatonin  6 mg Oral Once  . metoprolol tartrate  12.5 mg Oral BID  . ondansetron  4 mg Intravenous Once  . oxyCODONE  2.5 mg Oral QHS  . oxyCODONE  2.5 mg Oral Daily  . sodium chloride flush  10-40 mL Intracatheter Q12H   Continuous Infusions: . sodium chloride 1,000 mL (08/24/19 2247)  . lactated ringers 10 mL/hr at 08/10/19  0845  . nafcillin IV 2 g (09/11/19 1409)     LOS: 41 days    Time spent: 15 minutes  Edwin Dada, MD [facility] 09/11/2019, 6:49 PM     Please page through [place]:  [email or web address] Password [email or web address] If 7PM-7AM, please contact night-coverage

## 2019-09-11 NOTE — Progress Notes (Signed)
Patient ID: William [name], male   DOB: Mar 30, 1981, 38 y.o.   MRN: [id number]   LOS: 41 days   Subjective: Asked to reevaluate as pt is still about 2 weeks away from discharge and still having significant right shoulder issues. He doesn't describe much pain except with flexion and abduction through about 10 degrees of motion at about 70-80 degrees.    Objective: Vital signs in last 24 hours: Temp:  [98 F (36.7 C)-98.3 F (36.8 C)] 98.2 F (36.8 C) (10/13 0800) Pulse Rate:  [69-91] 91 (10/13 0800) Resp:  [16-18] 18 (10/13 0800) BP: (127-152)/(86-113) 152/113 (10/13 0800) SpO2:  [100 %] 100 % (10/13 0800) Last BM Date: 09/11/19   Physical Exam General appearance: alert and no distress  RUE: AROM flexion/abduction limited to 60 degrees, entire shoulder girdle moves as unit during motion   Assessment/Plan: Right shoulder pain -- I suspect this is adhesive capsulitis though still could be rotator cuff or other pathology. He probably needs an outpatient PT program with more modalities as their disposal. Once his infectious issues clear he would benefit from steroid injection but I am loathe to do that at present. Will get MRI to get a better sense of pathology.    Lisette Abu, PA-C Orthopedic Surgery [phone] 09/11/2019

## 2019-09-12 DIAGNOSIS — R7881 Bacteremia: Secondary | ICD-10-CM

## 2019-09-12 DIAGNOSIS — M4620 Osteomyelitis of vertebra, site unspecified: Secondary | ICD-10-CM

## 2019-09-12 DIAGNOSIS — F1990 Other psychoactive substance use, unspecified, uncomplicated: Secondary | ICD-10-CM

## 2019-09-12 DIAGNOSIS — Z938 Other artificial opening status: Secondary | ICD-10-CM

## 2019-09-12 DIAGNOSIS — L02413 Cutaneous abscess of right upper limb: Secondary | ICD-10-CM

## 2019-09-12 DIAGNOSIS — M86021 Acute hematogenous osteomyelitis, right humerus: Secondary | ICD-10-CM

## 2019-09-12 DIAGNOSIS — M7551 Bursitis of right shoulder: Secondary | ICD-10-CM

## 2019-09-12 LAB — GLUCOSE, CAPILLARY
Glucose-Capillary: 224 mg/dL — ABNORMAL HIGH (ref 70–99)
Glucose-Capillary: 167 mg/dL — ABNORMAL HIGH (ref 70–99)
Glucose-Capillary: 103 mg/dL — ABNORMAL HIGH (ref 70–99)
Glucose-Capillary: 197 mg/dL — ABNORMAL HIGH (ref 70–99)
Glucose-Capillary: 102 mg/dL — ABNORMAL HIGH (ref 70–99)
Glucose-Capillary: 181 mg/dL — ABNORMAL HIGH (ref 70–99)

## 2019-09-12 LAB — BASIC METABOLIC PANEL
Anion gap: 12 (ref 5–15)
BUN: 11 mg/dL (ref 6–20)
CO2: 27 mmol/L (ref 22–32)
Calcium: 9.1 mg/dL (ref 8.9–10.3)
Chloride: 102 mmol/L (ref 98–111)
Creatinine, Ser: 0.82 mg/dL (ref 0.61–1.24)
GFR calc Af Amer: >60 mL/min (ref >60)
GFR calc non Af Amer: >60 mL/min (ref >60)
Glucose, Bld: 101 mg/dL — ABNORMAL HIGH (ref 70–99)
Potassium: 3.1 mmol/L — ABNORMAL LOW (ref 3.5–5.1)
Sodium: 141 mmol/L (ref 135–145)

## 2019-09-12 LAB — C-REACTIVE PROTEIN: CRP: 5.4 mg/dL — ABNORMAL HIGH (ref <1.0)

## 2019-09-12 MED ORDER — ENSURE PRE-SURGERY PO LIQD
296.0000 mL | Freq: Once | ORAL | Status: AC
  Administered 2019-09-12: 22:00:00 296 mL via ORAL
  Filled 2019-09-12: qty 296

## 2019-09-12 MED ORDER — CHLORHEXIDINE GLUCONATE 4 % EX LIQD
60.0000 mL | Freq: Once | CUTANEOUS | Status: AC
  Administered 2019-09-12: 4 via TOPICAL
  Filled 2019-09-12: qty 60

## 2019-09-12 MED ORDER — CEFAZOLIN SODIUM-DEXTROSE 2-4 GM/100ML-% IV SOLN
2.0000 g | Freq: Three times a day (TID) | INTRAVENOUS | Status: DC
  Administered 2019-09-12 – 2019-10-02 (×58): 2 g via INTRAVENOUS
  Filled 2019-09-12 (×66): qty 100

## 2019-09-12 MED ORDER — POVIDONE-IODINE 10 % EX SWAB: 2.0000 "application " | Freq: Once | CUTANEOUS | Status: DC

## 2019-09-12 NOTE — Progress Notes (Signed)
Chaplain continues to follow this long term patient to support their continued healing.  The patient expressed gratitude for the support prior to surgery.  The chaplain will continue to follow this patient as long as they remain on the unit.  Brion Aliment Chaplain Resident For questions concerning this note please contact me by pager [phone]

## 2019-09-12 NOTE — Progress Notes (Signed)
Physical Therapy Treatment Patient Details Name: William [name] MRN: [id number] DOB: 1981-09-16 Today's Date: 09/12/2019    History of Present Illness Patient is a 38 y/o male who presents with lower back pain, left shoulder pain, right foot and heel pain. Found to have right heel abscess s/p I&D 9/4, left upper chest wall abscess and mass s/p I&D and wound vac application 9/3, new onset DM, sepsis with MSSA bacteremia due to IV drug use. CT scan- left anterior chest wall soft tissue and intramuscular infection with extension into the thoracic cavity, air in the soft tissues of the right axilla adjacent to the right shoulder, multiple small pulmonary nodules. PMH- heroin IV drug use.    PT Comments    Patient received up in chair, pleasant and willing to participate in therapy today, reports he got some bad news in that the infection in his shoulder is worse than they thought and he will likely have his admission extended due to this finding, states there may be some concern about rotator cuff tear as well. Education provided about potential presence of tear not necessarily being an indication for surgery as many tears are asymptomatic and do not limit function; did encourage him to speak openly to MD and OT about new information regarding infection in shoulder in terms of appropriateness of continuing active exercise in light of extent of infection. Continued gait training approximately 1053f with no device and distant S, gait continues to normalize with ongoing activity. Introduced progressive strengthening program in standing including mini-squats, heel/toe raises, and side stepping at rail in hallway with cues for form and appropriate range to avoid aggravation of pain. He was left up in the chair with all needs met and questions addressed this morning.     Follow Up Recommendations  Supervision for mobility/OOB;Outpatient PT     Equipment Recommendations  None recommended by PT     Recommendations for Other Services       Precautions / Restrictions Precautions Precautions: Fall Precaution Comments: weakness, chest wound vac pulled 08/24/19, probable SI joint dysfunction Restrictions Weight Bearing Restrictions: No    Mobility  Bed Mobility               General bed mobility comments: OOB in chair  Transfers Overall transfer level: Needs assistance Equipment used: None Transfers: Sit to/from Stand Sit to Stand: Modified independent (Device/Increase time)         General transfer comment: one hand pushing up from cane, other from chair  Ambulation/Gait Ambulation/Gait assistance: Supervision Gait Distance (Feet): 1000 Feet Assistive device: None Gait Pattern/deviations: Step-to pattern;Step-through pattern Gait velocity: decreased   General Gait Details: gait pattern continues to improve, tolerated multiple laps around unit without increase in pain   Stairs             Wheelchair Mobility    Modified Rankin (Stroke Patients Only)       Balance Overall balance assessment: Needs assistance Sitting-balance support: Feet supported;No upper extremity supported Sitting balance-Leahy Scale: Normal     Standing balance support: No upper extremity supported;During functional activity Standing balance-Leahy Scale: Good Standing balance comment: depends on pain, when pain is low balance is good, when pain is high balance is fair                            Cognition Arousal/Alertness: Awake/alert Behavior During Therapy: WFL for tasks assessed/performed Overall Cognitive Status: Within Functional Limits for tasks assessed  Exercises Other Exercises Other Exercises: mini-squats 1x10 within pain limitations Other Exercises: heel and toe raises off edge of step 1x10 Other Exercises: side stepping in hallway 44fx4 laps for hip abductor strength    General Comments         Pertinent Vitals/Pain Pain Assessment: Faces Faces Pain Scale: Hurts a little bit Pain Location: back with transition movements Pain Descriptors / Indicators: Grimacing;Discomfort Pain Intervention(s): Limited activity within patient's tolerance;Monitored during session    Home Living                      Prior Function            PT Goals (current goals can now be found in the care plan section) Acute Rehab PT Goals Patient Stated Goal: go home PT Goal Formulation: With patient Time For Goal Achievement: 09/17/19 Potential to Achieve Goals: Good Progress towards PT goals: Progressing toward goals    Frequency    Min 2X/week      PT Plan Frequency needs to be updated;Equipment recommendations need to be updated    Co-evaluation              AM-PAC PT "6 Clicks" Mobility   Outcome Measure  Help needed turning from your back to your side while in a flat bed without using bedrails?: None Help needed moving from lying on your back to sitting on the side of a flat bed without using bedrails?: None Help needed moving to and from a bed to a chair (including a wheelchair)?: None Help needed standing up from a chair using your arms (e.g., wheelchair or bedside chair)?: A Little Help needed to walk in hospital room?: A Little Help needed climbing 3-5 steps with a railing? : A Little 6 Click Score: 21    End of Session   Activity Tolerance: Patient tolerated treatment well Patient left: in chair;with call bell/phone within reach;with family/visitor present   PT Visit Diagnosis: Muscle weakness (generalized) (M62.81);Pain;Difficulty in walking, not elsewhere classified (R26.2) Pain - Right/Left: Right Pain - part of body: (SIJ)     Time: 137[address]-2876PT Time Calculation (min) (ACUTE ONLY): 25 min  Charges:  $Gait Training: 8-22 mins $Therapeutic Exercise: 8-22 mins                     KDeniece ReePT, DPT, CBIS  Supplemental Physical  Therapist [place]   Pager [phone] Rehab Office [phone]

## 2019-09-12 NOTE — Progress Notes (Signed)
PROGRESS NOTE    CEVIN RUBINSTEIN  IWL:[id number] DOB: 1981/03/09 DOA: 07/31/2019 PCP: Patient, No Pcp Per      Brief Narrative:  Mr. Delauder is a 38 y.o. M with IVDU who presented with pain in the low back, left shoulder and right foot, as well as flu-like symptoms, progressing over a few days.  In the ER, imaging showed right heel abscess, left upper chest abscess and septic emboli.  Blood cultures obtained, started on empiric antibiotics, and admitted for presumptive endocarditis and for chest wall debridement.     Interim history: 9/2 patient admitted from ER to ICU, underwent debridement of chest wall abscess in the OR, placement of wound vac. MRI lumbar spine showed paraspinal abscess (2x2x4), epidural abscesses, sacral osteomyleitis and lumbar vertebral osteomyelitis/discitis Urine, blood and wound cultures from admission all growing MSSA but thankfully repeat blood cultures 9/3, 9/6, and 9/10 all without growth Ankle abscess I&D'd on 9/4 Transitioned to nafcillin on 9/7 Right shoulder bursa aspirated on 9/11  Patient defervesced and continued on nafcillin MRI pelvis and MRI right shoulder obtained on 10/11-13 showed progression of lumbar/sacral osteo, shrinkage but persistence of paraspinal abscess and newly noted osteomyelitis with abscess of the right humeral head        Assessment & Plan:  Sepsis from MSSA bacteremia due to IVDU Lumbar osteomyelitis/discitis L2-4 Sacral osteomyelitis Paraspinal and epidural abscess No fever.  Checked with IR yesterday, paraspinal abscess is too small to aspirate, further it is improving with IV nafcillin. -Reconsult ID -Continue nafcillin for now, defer adjustments to ID -Obtain weekly CBC/BMP, CRP ESR    Abscess right ankle  Resolved  Septic bursitis right shoulder Osteomyelitis with abscess of right proximal humerus Subacromial/subdeltoid septic bursitis Persistent right shoulder pain.  Unfortunately, MRI yesterday showed  extensive right humeral head osteomyelitis, a humeral abscess and septic bursitis again. -Reconsult ID and Orthopedics, appreciate expert cares   Chest wall abscess s/p operative I&D with wound vac application 9/2 and 1/74 Septic pulmonary emboli Resolving well  Diabetes Glucose 700 at admission.  No previous diagnosis of diabetes.  No family history of diabetes.  Not obese.  A1c here 7.8%.  Glucose controlled here with minimal SSI I suspect he will likely NOT need metformin or other anti-glycemic's at discharge. -Continue SSI  IVDU -Continue telephonic psychology counseling -Had written for oxycodone taper to finish 1 week before discharge, if patient goes to surgery for shoulder, may need to adjust this  Anemia of chronic disease -Weekly CBC  Hypertension BP controlled -Continue metoprolol, will need rx at discharge  Other medications -Continue trazodone, Flexeril for insomnia, will need rx at discharge     MDM and disposition: The below labs and imaging reports reviewed and summarized above.  Medication management as above.   The patient was admitted with MSSA bacteremia, septic pulmonary emboli, large chest wall abscess, right shoulder septic arthritis, and right ankle abscess.    Unfortunately, we have found now new foci of infection that will require adjustment of antibiotics and possibly orthopedic surgery.  Defer duration of antibiotics to ID, appreciate their and Orthopedics        DVT prophylaxis: SCDs for now, start Lovenox post-op Code Status: FULL Family Communication: Mother    Consultants:   CT surgery  Orthopedics  Infectious disease  Neurosurgery  Procedures:   9/2 MRI lumbar spine  9/2 I&D left chest wall abscess with wound vac by Dr. Servando Snare  9/4 I&D right heel abscess at bedside by Hilbert Odor, [facility]  9/11 Excision wound debridemnt, wound vac change by Dr. Prescott Gum  9/14 right shoulder bursa aspiration by Silvestre Gunner,  [facility]  10/12 MRI pelvis   10/13 MRI RIGHT shoulder  Antimicrobials:   Nafcillin   Culture data:   9/1 urine culture -- MSSA   9/2 blood culture x2 -- MSSA in 2/2  9/2 wound culture -- MSSA  9/2 chest wall abscess intraop culture -- MSSA  9/3 blood culture x2 -- NG  9/4 heel abscess culture -- MSSA  9/6 blood culture x2 -- NG  9/10 blood culture x2 -- NG  9/14 aspirate shoulder culture -- NG      Subjective: No fever, no malaise, no weakness.  Right shoulder pain and limited range of motion no change, right buttock pain with sitting, no change.  No other joints that are red or swollen.     Objective: Vitals:   09/11/19 2253 09/12/19 0839 09/12/19 1025 09/12/19 1152  BP:  (!) 155/98 (!) 145/97 118/78  Pulse:  (!) 106 79 75  Resp: '16 17  16  ' Temp: 97.6 F (36.4 C) 97.9 F (36.6 C)  (!) 97.4 F (36.3 C)  TempSrc: Oral Oral  Oral  SpO2: 99% 100%  100%  Weight:      Height:        Intake/Output Summary (Last 24 hours) at 09/12/2019 1738 Last data filed at 09/12/2019 1518 Gross per 24 hour  Intake 561.47 ml  Output 1450 ml  Net -888.53 ml   Filed Weights   09/06/19 0500 2019/10/04 0445 09/09/19 0500  Weight: 77.3 kg 79.5 kg 80.1 kg    Examination: General appearance: Thin adult male, sitting in chair, no acute distress. HEENT:    Skin:   Cardiac: RRR, no murmurs, no lower extremity edema. Respiratory: Normal respiratory rate and rhythm, lungs clear without rales or wheezes. Abdomen:     MSK: Right shoulder with reduced range of motion, no effusion or redness, gait normal. Neuro: Alert and oriented, extraocular movements intact, moves all extremities with normal strength and coordination, gait normal.  Speech fluent. Psych: Sensorium intact responding to questions, attention normal, affect normal, judgment insight appear normal.      Data Reviewed: I have personally reviewed following labs and imaging studies:  CBC: Recent Labs  Lab  2019-10-04 1134  WBC 4.3  HGB 10.1*  HCT 30.6*  MCV 91.3  PLT 161   Basic Metabolic Panel: Recent Labs  Lab 2019-10-04 1134 09/12/19 1106  NA 140 141  K 3.3* 3.1*  CL 104 102  CO2 23 27  GLUCOSE 201* 101*  BUN 11 11  CREATININE 0.72 0.82  CALCIUM 8.6* 9.1   GFR: Estimated Creatinine Clearance: 138.4 mL/min (by C-G formula based on SCr of 0.82 mg/dL). Liver Function Tests: No results for input(s): AST, ALT, ALKPHOS, BILITOT, PROT, ALBUMIN in the last 168 hours. No results for input(s): LIPASE, AMYLASE in the last 168 hours. No results for input(s): AMMONIA in the last 168 hours. Coagulation Profile: No results for input(s): INR, PROTIME in the last 168 hours. Cardiac Enzymes: No results for input(s): CKTOTAL, CKMB, CKMBINDEX, TROPONINI in the last 168 hours. BNP (last 3 results) No results for input(s): PROBNP in the last 8760 hours. HbA1C: No results for input(s): HGBA1C in the last 72 hours. CBG: Recent Labs  Lab 09/11/19 1705 09/11/19 2058 09/12/19 0618 09/12/19 1149 09/12/19 1715  GLUCAP 126* 205* 103* 197* 102*   Lipid Profile: No results for input(s): CHOL, HDL, LDLCALC, TRIG,  CHOLHDL, LDLDIRECT in the last 72 hours. Thyroid Function Tests: No results for input(s): TSH, T4TOTAL, FREET4, T3FREE, THYROIDAB in the last 72 hours. Anemia Panel: No results for input(s): VITAMINB12, FOLATE, FERRITIN, TIBC, IRON, RETICCTPCT in the last 72 hours. Urine analysis:    Component Value Date/Time   COLORURINE YELLOW 08/09/2019 1845   APPEARANCEUR CLEAR 08/09/2019 1845   LABSPEC 1.029 08/09/2019 1845   PHURINE 5.0 08/09/2019 1845   GLUCOSEU 50 (A) 08/09/2019 1845   HGBUR MODERATE (A) 08/09/2019 1845   BILIRUBINUR NEGATIVE 08/09/2019 1845   KETONESUR NEGATIVE 08/09/2019 1845   PROTEINUR 30 (A) 08/09/2019 1845   NITRITE NEGATIVE 08/09/2019 1845   LEUKOCYTESUR NEGATIVE 08/09/2019 1845   Sepsis Labs: '@LABRCNTIP' (procalcitonin:4,lacticacidven:4)  )No results found for  this or any previous visit (from the past 240 hour(s)).       Radiology Studies: Mr Shoulder Right W Wo Contrast  Result Date: 09/11/2019 CLINICAL DATA:  Right shoulder pain. Sepsis. Staph infection of the chest wall. EXAM: MRI OF THE RIGHT SHOULDER WITHOUT AND WITH CONTRAST TECHNIQUE: Multiplanar, multisequence MR imaging of the right shoulder was performed before and after the administration of intravenous contrast. CONTRAST:  1m GADAVIST GADOBUTROL 1 MMOL/ML IV SOLN COMPARISON:  CT scan dated 08/09/2019 FINDINGS: Rotator cuff: There is a deep partial-thickness bursal surface tear of the distal supraspinatus tendon 3 cm in width and a maximum of 13 mm in length. The remainder of the rotator cuff is intact. Muscles:  No atrophy. Biceps long head:  Properly located and intact. Acromioclavicular Joint: Normal AC joint. Type 2 acromion. Prominent inhomogeneous fluid in the subacromial and subdeltoid bursae with intense enhancement of the margins of the bursa after contrast administration. Glenohumeral Joint: No glenohumeral joint effusion or chondral defect. Labrum:  Intact. Bones: Extensive edema and abnormal enhancement of the majority of the humeral head and in the proximal shaft with a rim enhancing fluid collection in the proximal humeral shaft. The finding is consistent with extensive osteomyelitis of the proximal humerus. Other: After contrast administration there is enhancement along the periphery of the supraspinatus and infraspinatus muscles, probably representing infection spread along the fascia. IMPRESSION: 1. Septic subacromial/subdeltoid bursitis with a deep partial-thickness bursal surface tear of the distal supraspinatus tendon. 2. Extensive osteomyelitis of the proximal right humerus with an abscess in the medullary cavity proximal humeral shaft. 3. At this point there is no evidence of septic arthritis of the glenohumeral joint or of the [facility]. Electronically Signed   By: JLorriane ShireM.D.   On: 09/11/2019 20:58        Scheduled Meds:  feeding supplement (ENSURE ENLIVE)  237 mL Oral BID BM   insulin aspart  0-15 Units Subcutaneous TID WC   insulin aspart  0-5 Units Subcutaneous QHS   magnesium oxide  400 mg Oral BID   Melatonin  6 mg Oral Once   metoprolol tartrate  12.5 mg Oral BID   ondansetron  4 mg Intravenous Once   oxyCODONE  2.5 mg Oral QHS   oxyCODONE  2.5 mg Oral Daily   sodium chloride flush  10-40 mL Intracatheter Q12H   Continuous Infusions:  sodium chloride 1,000 mL (08/24/19 2247)    ceFAZolin (ANCEF) IV 200 mL/hr at 09/12/19 1518   lactated ringers 10 mL/hr at 08/10/19 0845     LOS: 42 days    Time spent: 25 minutes  CEdwin Dada MD [facility] 09/12/2019, 5:38 PM     Please page through [email or web address]  [email or web address]  Password [email or web address] If 7PM-7AM, please contact night-coverage

## 2019-09-12 NOTE — Plan of Care (Signed)
  Problem: Clinical Measurements: Goal: Ability to maintain clinical measurements within normal limits will improve Outcome: Progressing   Problem: Clinical Measurements: Goal: Ability to maintain clinical measurements within normal limits will improve Outcome: Progressing   Problem: Nutrition: Goal: Adequate nutrition will be maintained Outcome: Progressing   Problem: Health Behavior/Discharge Planning: Goal: Ability to manage health-related needs will improve Outcome: Progressing

## 2019-09-12 NOTE — Consult Note (Addendum)
[facility]    Date of Admission:  07/31/2019     Total days of antibiotics 37            Reason for Consult: Antibiotic regimen    Referring Provider: Joen Laura, MD Primary Care Provider: None  Assessment: In summary, William [name] is a 38 year old gentleman with past medical history significant for IV drug abuse who presented with disseminated MSSA infection. He has several abscesses located in the chest wall (s/p I&D w/ wound vac), lumbar paraspinal and epidural abcesses, sacral osteomyelitis and lumbar vertebral osteomyelitis/discitisosteomyelitis and most recently R humerus osteomyelitis.    Plan:    [facility] Staphylococcus aureus bacteremia   Staphylococcus aureus bacteremia (SAB) is associated with a high rate of complications and mortality.  Specific aspects of clinical management are critical to optimizing the outcome of patients with SAB.  Therefore, the [facility] Antimicrobial Management Team [facility]) has initiated an intervention aimed at improving the management of SAB at [facility].  To do so, Infectious Diseases physicians are providing an evidence-based consult for the management of all patients with SAB.     Yes No Comments  Perform follow-up blood cultures (even if the patient is afebrile) to ensure clearance of bacteremia  Repeat blood cultures on 9/3, 9/6 and 9/10 have been negative [x]  []    Remove vascular catheter and obtain follow-up blood cultures after the removal of the catheter  Repeat blood cultures on 9/3, 9/6 and 9/10 have been negative [x]  []    Perform echocardiography to evaluate for endocarditis (transthoracic ECHO is 40-50% sensitive, TEE is > 90% sensitive)  TEE completed on 9/02 demonstrated no evidence of valve vegetations suggesting endocarditis   [x]  []  Please keep in mind, that neither test can definitively EXCLUDE endocarditis, and that should clinical suspicion remain high for  endocarditis the patient should then still be treated with an "endocarditis" duration of therapy = 6 weeks  Consult electrophysiologist to evaluate implanted cardiac device (pacemaker, ICD) []  [x]    Ensure source control [x]  []  Have all abscesses been drained effectively? Have deep seeded infections (septic joints or osteomyelitis) had appropriate surgical debridement?  Investigate for metastatic sites of infection [x]  []  Does the patient have ANY symptom or physical exam finding that would suggest a deeper infection (back or neck pain that may be suggestive of vertebral osteomyelitis or epidural abscess, muscle pain that could be a symptom of pyomyositis)?  Keep in mind that for deep seeded infections MRI imaging with contrast is preferred rather than other often insensitive tests such as plain x-rays, especially early in a patient's presentation.  Change antibiotic therapy to __________________  []  []  Beta-lactam antibiotics are preferred for MSSA due to higher cure rates.   If on Vancomycin, goal trough should be 15 - 20 mcg/mL  Estimated duration of IV antibiotic therapy:  D/c Naficillin. Start Cefazolin today. Discharging with PICC line not an option given pt's hx of IV drug use, so patient will likely require 4-6w of additional IV abx []  []  Consult case management for probably prolonged outpatient IV antibiotic therapy   Principal Problem:   Staphylococcus aureus bacteremia Active Problems:   Sepsis (HCC)   Abscess of paraspinous muscles   Osteomyelitis of lumbar spine (HCC)   Epidural abscess of spine due to infective embolism   Abnormal chest CT   Septic embolism (HCC)   Community acquired pneumonia   IV drug abuse (HCC)   Heroin abuse (HCC)  Elevated troponin   UTI (urinary tract infection)   Renal failure   Hyperglycemia   Suspected endocarditis   Chest wall abscess   Abscess of right heel   Acute osteomyelitis of lumbar spine (HCC)   feeding supplement (ENSURE ENLIVE)   237 mL Oral BID BM   insulin aspart  0-15 Units Subcutaneous TID WC   insulin aspart  0-5 Units Subcutaneous QHS   magnesium oxide  400 mg Oral BID   Melatonin  6 mg Oral Once   metoprolol tartrate  12.5 mg Oral BID   ondansetron  4 mg Intravenous Once   oxyCODONE  2.5 mg Oral QHS   oxyCODONE  2.5 mg Oral Daily   sodium chloride flush  10-40 mL Intracatheter Q12H   HPI: William [name] is a 38 y.o. male with a past medical history significant for IV drug use who presented initially with lower back, L shoulder, right foot pain as well as flu like symptoms that was progressing over several days prior to admission. In the ED, imaging which revealed right heel abscess and left upper chest abscess. On 9/2, the patient went to the ICU and underwent debridement of the chest wall abscess and had a wound VAC placed. MRI of the lumbar spine showed paraspinal abscess, epidural abscess, sacral osteomyelitis and lumbar vertebral osteomyelitis/discitis. The patient had positive urine and blood cultures that grew MSSA. His repeat blood cultures on 9/3, 9/6 and 9/10 have been negative.    Of note, a repeat MRI of the pelvis was done on 10/11 due to persistent lower back pain, which showed new septic arthritis/osteomyelitiis at the R L4-5 & L5-S1. MRI of the R shoulder was also obtained 10/13 for new shoulder pain, which demonstrated extensive osteomyelitis of the R humerus w/ abscess in medullary cavity as well as subacromial bursitis and partial supraspinatus tear.  Review of Systems: All systems have been reviewed and are otherwise negative unless mentioned in the HPI.  Past Medical History:  Diagnosis Date   Staph infection 07/2019   CHEST WALL   Social History   Tobacco Use   Smoking status: Never Smoker   Smokeless tobacco: Never Used  Substance Use Topics   Alcohol use: Not Currently   Drug use: Yes    Comment: opiates    History reviewed. No pertinent family history. No Known  Allergies  OBJECTIVE: Blood pressure (!) 145/97, pulse 79, temperature 97.9 F (36.6 C), temperature source Oral, resp. rate 17, height 6\' 2"  (1.88 m), weight 80.1 kg, SpO2 100 %.  Physical Exam Vitals signs reviewed.  Constitutional:      General: He is not in acute distress.    Appearance: Normal appearance. He is not ill-appearing, toxic-appearing or diaphoretic.  HENT:     Head: Normocephalic and atraumatic.  Eyes:     General:        Right eye: No discharge.        Left eye: No discharge.     Extraocular Movements: Extraocular movements intact.  Cardiovascular:     Rate and Rhythm: Normal rate and regular rhythm.     Pulses: Normal pulses.     Heart sounds: Normal heart sounds. No murmur. No gallop.   Pulmonary:     Effort: Pulmonary effort is normal. No respiratory distress.     Breath sounds: Normal breath sounds. No wheezing or rales.  Abdominal:     General: Abdomen is flat.     Palpations: Abdomen is soft.  Musculoskeletal:  Comments: R shoulder pain   Neurological:     General: No focal deficit present.     Mental Status: He is alert and oriented to person, place, and time.  Psychiatric:        Mood and Affect: Mood normal.    Lab Results Lab Results  Component Value Date   WBC 4.3 09/07/2019   HGB 10.1 (L) 09/07/2019   HCT 30.6 (L) 09/07/2019   MCV 91.3 09/07/2019   PLT 373 09/07/2019    Lab Results  Component Value Date   CREATININE 0.72 09/07/2019   BUN 11 09/07/2019   NA 140 09/07/2019   K 3.3 (L) 09/07/2019   CL 104 09/07/2019   CO2 23 09/07/2019    Lab Results  Component Value Date   ALT 12 08/31/2019   AST 15 08/31/2019   ALKPHOS 90 08/31/2019   BILITOT 1.2 08/31/2019    Microbiology: No results found for this or any previous visit (from the past 240 hour(s)).  Earlene Plater, MD Internal Medicine, PGY1 Pager: [phone]  09/12/2019,11:22 AM

## 2019-09-13 ENCOUNTER — Encounter (HOSPITAL_COMMUNITY): Payer: Self-pay | Admitting: Orthopedic Surgery

## 2019-09-13 ENCOUNTER — Inpatient Hospital Stay (HOSPITAL_COMMUNITY): Payer: BC Managed Care – PPO | Admitting: Certified Registered Nurse Anesthetist

## 2019-09-13 ENCOUNTER — Inpatient Hospital Stay (HOSPITAL_COMMUNITY): Payer: BC Managed Care – PPO

## 2019-09-13 ENCOUNTER — Encounter (HOSPITAL_COMMUNITY)
Admission: EM | Disposition: A | Discharge status: Home / Self Care | Payer: Self-pay | Source: Home / Self Care | Attending: Internal Medicine

## 2019-09-13 DIAGNOSIS — M868X2 Other osteomyelitis, upper arm: Secondary | ICD-10-CM | POA: Diagnosis not present

## 2019-09-13 DIAGNOSIS — M7551 Bursitis of right shoulder: Secondary | ICD-10-CM | POA: Diagnosis not present

## 2019-09-13 DIAGNOSIS — G061 Intraspinal abscess and granuloma: Secondary | ICD-10-CM | POA: Diagnosis not present

## 2019-09-13 DIAGNOSIS — R7881 Bacteremia: Secondary | ICD-10-CM | POA: Diagnosis not present

## 2019-09-13 DIAGNOSIS — M86021 Acute hematogenous osteomyelitis, right humerus: Secondary | ICD-10-CM

## 2019-09-13 DIAGNOSIS — J188 Other pneumonia, unspecified organism: Secondary | ICD-10-CM | POA: Diagnosis not present

## 2019-09-13 DIAGNOSIS — M86121 Other acute osteomyelitis, right humerus: Secondary | ICD-10-CM | POA: Diagnosis not present

## 2019-09-13 DIAGNOSIS — L02213 Cutaneous abscess of chest wall: Secondary | ICD-10-CM | POA: Diagnosis not present

## 2019-09-13 DIAGNOSIS — A4101 Sepsis due to Methicillin susceptible Staphylococcus aureus: Secondary | ICD-10-CM | POA: Diagnosis not present

## 2019-09-13 DIAGNOSIS — B9561 Methicillin susceptible Staphylococcus aureus infection as the cause of diseases classified elsewhere: Secondary | ICD-10-CM | POA: Diagnosis not present

## 2019-09-13 DIAGNOSIS — R739 Hyperglycemia, unspecified: Secondary | ICD-10-CM | POA: Diagnosis not present

## 2019-09-13 DIAGNOSIS — M4628 Osteomyelitis of vertebra, sacral and sacrococcygeal region: Secondary | ICD-10-CM

## 2019-09-13 HISTORY — PX: I&D EXTREMITY: SHX5045

## 2019-09-13 LAB — CBC
HCT: 29.3 % — ABNORMAL LOW (ref 39.0–52.0)
Hemoglobin: 9.8 g/dL — ABNORMAL LOW (ref 13.0–17.0)
MCH: 30.6 pg (ref 26.0–34.0)
MCHC: 33.4 g/dL (ref 30.0–36.0)
MCV: 91.6 fL (ref 80.0–100.0)
Platelets: 257 10*3/uL (ref 150–400)
RBC: 3.2 MIL/uL — ABNORMAL LOW (ref 4.22–5.81)
RDW: 15.4 % (ref 11.5–15.5)
WBC: 3.7 10*3/uL — ABNORMAL LOW (ref 4.0–10.5)
nRBC: 0 % (ref 0.0–0.2)

## 2019-09-13 LAB — BASIC METABOLIC PANEL
Anion gap: 10 (ref 5–15)
BUN: 16 mg/dL (ref 6–20)
CO2: 25 mmol/L (ref 22–32)
Calcium: 8.8 mg/dL — ABNORMAL LOW (ref 8.9–10.3)
Chloride: 105 mmol/L (ref 98–111)
Creatinine, Ser: 0.72 mg/dL (ref 0.61–1.24)
GFR calc Af Amer: >60 mL/min (ref >60)
GFR calc non Af Amer: >60 mL/min (ref >60)
Glucose, Bld: 103 mg/dL — ABNORMAL HIGH (ref 70–99)
Potassium: 3.3 mmol/L — ABNORMAL LOW (ref 3.5–5.1)
Sodium: 140 mmol/L (ref 135–145)

## 2019-09-13 LAB — RPR: RPR Ser Ql: NONREACTIVE

## 2019-09-13 LAB — GLUCOSE, CAPILLARY
Glucose-Capillary: 95 mg/dL (ref 70–99)
Glucose-Capillary: 90 mg/dL (ref 70–99)
Glucose-Capillary: 315 mg/dL — ABNORMAL HIGH (ref 70–99)

## 2019-09-13 LAB — SEDIMENTATION RATE: Sed Rate: 63 mm/hr — ABNORMAL HIGH (ref 0–16)

## 2019-09-13 SURGERY — IRRIGATION AND DEBRIDEMENT EXTREMITY
Anesthesia: General | Site: Shoulder | Laterality: Right

## 2019-09-13 MED ORDER — LACTATED RINGERS IV SOLN
INTRAVENOUS | Status: DC
  Administered 2019-09-13 (×2): via INTRAVENOUS

## 2019-09-13 MED ORDER — SUGAMMADEX SODIUM 200 MG/2ML IV SOLN
INTRAVENOUS | Status: DC | PRN
  Administered 2019-09-13: 300 mg via INTRAVENOUS

## 2019-09-13 MED ORDER — OXYCODONE HCL 5 MG PO TABS
ORAL_TABLET | ORAL | Status: AC
  Administered 2019-09-14: 2.5 mg via ORAL
  Filled 2019-09-13: qty 1

## 2019-09-13 MED ORDER — ROCURONIUM BROMIDE 10 MG/ML (PF) SYRINGE
PREFILLED_SYRINGE | INTRAVENOUS | Status: AC
  Filled 2019-09-13: qty 10

## 2019-09-13 MED ORDER — SODIUM CHLORIDE 0.9 % IR SOLN
Status: DC | PRN
  Administered 2019-09-13: 3000 mL

## 2019-09-13 MED ORDER — ROCURONIUM BROMIDE 10 MG/ML (PF) SYRINGE
PREFILLED_SYRINGE | INTRAVENOUS | Status: DC | PRN
  Administered 2019-09-13: 100 mg via INTRAVENOUS

## 2019-09-13 MED ORDER — ACETAMINOPHEN 10 MG/ML IV SOLN
INTRAVENOUS | Status: AC
  Filled 2019-09-13: qty 100

## 2019-09-13 MED ORDER — GENTAMICIN SULFATE 40 MG/ML IJ SOLN
INTRAMUSCULAR | Status: AC
  Filled 2019-09-13: qty 2

## 2019-09-13 MED ORDER — SUCCINYLCHOLINE CHLORIDE 200 MG/10ML IV SOSY
PREFILLED_SYRINGE | INTRAVENOUS | Status: AC
  Filled 2019-09-13: qty 10

## 2019-09-13 MED ORDER — FENTANYL CITRATE (PF) 250 MCG/5ML IJ SOLN
INTRAMUSCULAR | Status: AC
  Filled 2019-09-13: qty 5

## 2019-09-13 MED ORDER — LIDOCAINE 2% (20 MG/ML) 5 ML SYRINGE
INTRAMUSCULAR | Status: AC
  Filled 2019-09-13: qty 5

## 2019-09-13 MED ORDER — VANCOMYCIN HCL 1000 MG IV SOLR
INTRAVENOUS | Status: DC | PRN
  Administered 2019-09-13: 1000 mg

## 2019-09-13 MED ORDER — PHENYLEPHRINE 40 MCG/ML (10ML) SYRINGE FOR IV PUSH (FOR BLOOD PRESSURE SUPPORT)
PREFILLED_SYRINGE | INTRAVENOUS | Status: DC | PRN
  Administered 2019-09-13: 120 ug via INTRAVENOUS

## 2019-09-13 MED ORDER — PHENYLEPHRINE 40 MCG/ML (10ML) SYRINGE FOR IV PUSH (FOR BLOOD PRESSURE SUPPORT)
PREFILLED_SYRINGE | INTRAVENOUS | Status: AC
  Filled 2019-09-13: qty 10

## 2019-09-13 MED ORDER — CEFAZOLIN SODIUM-DEXTROSE 2-4 GM/100ML-% IV SOLN
2.0000 g | INTRAVENOUS | Status: AC
  Administered 2019-09-13: 2 g via INTRAVENOUS
  Filled 2019-09-13: qty 100

## 2019-09-13 MED ORDER — 0.9 % SODIUM CHLORIDE (POUR BTL) OPTIME
TOPICAL | Status: DC | PRN
  Administered 2019-09-13: 1000 mL

## 2019-09-13 MED ORDER — PROMETHAZINE HCL 25 MG/ML IJ SOLN: 6.2500 mg | INTRAMUSCULAR | Status: DC | PRN

## 2019-09-13 MED ORDER — LIDOCAINE 2% (20 MG/ML) 5 ML SYRINGE
INTRAMUSCULAR | Status: DC | PRN
  Administered 2019-09-13: 100 mg via INTRAVENOUS

## 2019-09-13 MED ORDER — ONDANSETRON HCL 4 MG/2ML IJ SOLN
INTRAMUSCULAR | Status: AC
  Filled 2019-09-13: qty 2

## 2019-09-13 MED ORDER — BUPIVACAINE HCL (PF) 0.5 % IJ SOLN
INTRAMUSCULAR | Status: AC
  Filled 2019-09-13: qty 30

## 2019-09-13 MED ORDER — PROPOFOL 10 MG/ML IV BOLUS
INTRAVENOUS | Status: AC
  Filled 2019-09-13: qty 20

## 2019-09-13 MED ORDER — GADOBUTROL 1 MMOL/ML IV SOLN
7.5000 mL | Freq: Once | INTRAVENOUS | Status: AC | PRN
  Administered 2019-09-13: 7.5 mL via INTRAVENOUS

## 2019-09-13 MED ORDER — SODIUM CHLORIDE 0.9 % IV SOLN
INTRAVENOUS | Status: DC | PRN
  Administered 2019-09-13: 500 mL

## 2019-09-13 MED ORDER — KETOROLAC TROMETHAMINE 30 MG/ML IJ SOLN
30.0000 mg | Freq: Once | INTRAMUSCULAR | Status: AC
  Administered 2019-09-13: 20:00:00 30 mg via INTRAVENOUS

## 2019-09-13 MED ORDER — DEXAMETHASONE SODIUM PHOSPHATE 10 MG/ML IJ SOLN
INTRAMUSCULAR | Status: DC | PRN
  Administered 2019-09-13: 5 mg via INTRAVENOUS

## 2019-09-13 MED ORDER — MIDAZOLAM HCL 2 MG/2ML IJ SOLN
INTRAMUSCULAR | Status: DC | PRN
  Administered 2019-09-13: 2 mg via INTRAVENOUS

## 2019-09-13 MED ORDER — SODIUM CHLORIDE 0.9 % IV SOLN
INTRAVENOUS | Status: AC
  Filled 2019-09-13: qty 500000

## 2019-09-13 MED ORDER — MIDAZOLAM HCL 2 MG/2ML IJ SOLN
INTRAMUSCULAR | Status: AC
  Filled 2019-09-13: qty 2

## 2019-09-13 MED ORDER — KETOROLAC TROMETHAMINE 30 MG/ML IJ SOLN
INTRAMUSCULAR | Status: AC
  Filled 2019-09-13: qty 1

## 2019-09-13 MED ORDER — POTASSIUM CHLORIDE CRYS ER 20 MEQ PO TBCR
40.0000 meq | EXTENDED_RELEASE_TABLET | Freq: Once | ORAL | Status: DC
  Filled 2019-09-13: qty 2

## 2019-09-13 MED ORDER — OXYCODONE HCL 5 MG PO TABS
5.0000 mg | ORAL_TABLET | Freq: Once | ORAL | Status: AC | PRN
  Administered 2019-09-13: 5 mg via ORAL

## 2019-09-13 MED ORDER — GENTAMICIN SULFATE 40 MG/ML IJ SOLN
INTRAMUSCULAR | Status: DC | PRN
  Administered 2019-09-13: 80 mg

## 2019-09-13 MED ORDER — BUPIVACAINE HCL (PF) 0.5 % IJ SOLN
INTRAMUSCULAR | Status: DC | PRN
  Administered 2019-09-13: 10 mL

## 2019-09-13 MED ORDER — DEXAMETHASONE SODIUM PHOSPHATE 10 MG/ML IJ SOLN
INTRAMUSCULAR | Status: AC
  Filled 2019-09-13: qty 2

## 2019-09-13 MED ORDER — PROPOFOL 10 MG/ML IV BOLUS
INTRAVENOUS | Status: DC | PRN
  Administered 2019-09-13: 160 mg via INTRAVENOUS

## 2019-09-13 MED ORDER — VANCOMYCIN HCL 1000 MG IV SOLR
INTRAVENOUS | Status: AC
  Filled 2019-09-13: qty 1000

## 2019-09-13 MED ORDER — OXYCODONE HCL 5 MG/5ML PO SOLN: 5.0000 mg | Freq: Once | ORAL | Status: AC | PRN

## 2019-09-13 MED ORDER — FENTANYL CITRATE (PF) 100 MCG/2ML IJ SOLN
INTRAMUSCULAR | Status: DC | PRN
  Administered 2019-09-13 (×3): 50 ug via INTRAVENOUS
  Administered 2019-09-13 (×2): 100 ug via INTRAVENOUS
  Administered 2019-09-13: 50 ug via INTRAVENOUS

## 2019-09-13 MED ORDER — ACETAMINOPHEN 10 MG/ML IV SOLN
1000.0000 mg | Freq: Once | INTRAVENOUS | Status: DC | PRN
  Administered 2019-09-13: 1000 mg via INTRAVENOUS

## 2019-09-13 MED ORDER — HYDROMORPHONE HCL 1 MG/ML IJ SOLN: 0.2500 mg | INTRAMUSCULAR | Status: DC | PRN

## 2019-09-13 MED ORDER — EPHEDRINE 5 MG/ML INJ
INTRAVENOUS | Status: AC
  Filled 2019-09-13: qty 10

## 2019-09-13 MED ORDER — ONDANSETRON HCL 4 MG/2ML IJ SOLN
INTRAMUSCULAR | Status: DC | PRN
  Administered 2019-09-13: 4 mg via INTRAVENOUS

## 2019-09-13 SURGICAL SUPPLY — 50 items
BNDG CONFORM 2 STRL LF (GAUZE/BANDAGES/DRESSINGS) IMPLANT
BNDG ELASTIC 4X5.8 VLCR STR LF (GAUZE/BANDAGES/DRESSINGS) ×3 IMPLANT
BNDG GAUZE ELAST 4 BULKY (GAUZE/BANDAGES/DRESSINGS) ×9 IMPLANT
CORD BIPOLAR FORCEPS 12FT (ELECTRODE) ×3 IMPLANT
COVER SURGICAL LIGHT HANDLE (MISCELLANEOUS) ×3 IMPLANT
COVER WAND RF STERILE (DRAPES) ×3 IMPLANT
CUFF TOURN SGL QUICK 18X4 (TOURNIQUET CUFF) ×3 IMPLANT
CUFF TOURN SGL QUICK 24 (TOURNIQUET CUFF)
CUFF TRNQT CYL 24X4X16.5-23 (TOURNIQUET CUFF) IMPLANT
DRSG ADAPTIC 3X8 NADH LF (GAUZE/BANDAGES/DRESSINGS) ×3 IMPLANT
DRSG PAD ABDOMINAL 8X10 ST (GAUZE/BANDAGES/DRESSINGS) ×4 IMPLANT
DRSG XEROFORM 1X8 (GAUZE/BANDAGES/DRESSINGS) ×2 IMPLANT
EVACUATOR 1/8 PVC DRAIN (DRAIN) ×3 IMPLANT
GAUZE SPONGE 4X4 12PLY STRL (GAUZE/BANDAGES/DRESSINGS) ×3 IMPLANT
GAUZE XEROFORM 1X8 LF (GAUZE/BANDAGES/DRESSINGS) ×3 IMPLANT
GLOVE BIO SURGEON STRL SZ7.5 (GLOVE) ×3 IMPLANT
GLOVE BIOGEL PI IND STRL 8 (GLOVE) ×1 IMPLANT
GLOVE BIOGEL PI INDICATOR 8 (GLOVE) ×2
GOWN STRL REUS W/ TWL LRG LVL3 (GOWN DISPOSABLE) ×1 IMPLANT
GOWN STRL REUS W/ TWL XL LVL3 (GOWN DISPOSABLE) ×2 IMPLANT
GOWN STRL REUS W/TWL LRG LVL3 (GOWN DISPOSABLE) ×3
GOWN STRL REUS W/TWL XL LVL3 (GOWN DISPOSABLE) ×6
KIT BASIN OR (CUSTOM PROCEDURE TRAY) ×3 IMPLANT
KIT STIMULAN RAPID CURE  10CC (Orthopedic Implant) ×2 IMPLANT
KIT STIMULAN RAPID CURE 10CC (Orthopedic Implant) ×1 IMPLANT
KIT TURNOVER KIT B (KITS) ×3 IMPLANT
MANIFOLD NEPTUNE II (INSTRUMENTS) ×3 IMPLANT
NDL HYPO 25GX1X1/2 BEV (NEEDLE) IMPLANT
NEEDLE HYPO 25GX1X1/2 BEV (NEEDLE) IMPLANT
NS IRRIG 1000ML POUR BTL (IV SOLUTION) ×3 IMPLANT
PACK ORTHO EXTREMITY (CUSTOM PROCEDURE TRAY) ×3 IMPLANT
PAD ARMBOARD 7.5X6 YLW CONV (MISCELLANEOUS) ×3 IMPLANT
PAD CAST 4YDX4 CTTN HI CHSV (CAST SUPPLIES) ×1 IMPLANT
PADDING CAST COTTON 4X4 STRL (CAST SUPPLIES) ×3
SET CYSTO W/LG BORE CLAMP LF (SET/KITS/TRAYS/PACK) ×3 IMPLANT
SLING ARM IMMOBILIZER LRG (SOFTGOODS) ×2 IMPLANT
SOL PREP POV-IOD 4OZ 10% (MISCELLANEOUS) ×6 IMPLANT
SPONGE LAP 4X18 RFD (DISPOSABLE) ×3 IMPLANT
SUT PDS AB 0 CT1 27 (SUTURE) ×2 IMPLANT
SUT PDS AB 2-0 CT1 27 (SUTURE) ×2 IMPLANT
SWAB CULTURE ESWAB REG 1ML (MISCELLANEOUS) IMPLANT
SYR CONTROL 10ML LL (SYRINGE) IMPLANT
TAPE CLOTH SURG 6X10 WHT LF (GAUZE/BANDAGES/DRESSINGS) ×3 IMPLANT
TOWEL GREEN STERILE (TOWEL DISPOSABLE) ×3 IMPLANT
TOWEL GREEN STERILE FF (TOWEL DISPOSABLE) ×3 IMPLANT
TUBE CONNECTING 12'X1/4 (SUCTIONS) ×1
TUBE CONNECTING 12X1/4 (SUCTIONS) ×2 IMPLANT
UNDERPAD 30X30 (UNDERPADS AND DIAPERS) ×3 IMPLANT
WATER STERILE IRR 1000ML POUR (IV SOLUTION) ×3 IMPLANT
YANKAUER SUCT BULB TIP NO VENT (SUCTIONS) ×3 IMPLANT

## 2019-09-13 NOTE — Consult Note (Signed)
Subjective: Pt seen at the bedside on rounds this AM. No complaints at this time. Pt knows he is scheduled for surgery this afternoon.  Antibiotics:  Anti-infectives (From admission, onward)   Start     Dose/Rate Route Frequency Ordered Stop   09/13/19 0945  ceFAZolin (ANCEF) IVPB 2g/100 mL premix     2 g 200 mL/hr over 30 Minutes Intravenous On call to O.R. 09/13/19 0938 09/14/19 0559   09/12/19 1500  ceFAZolin (ANCEF) IVPB 2g/100 mL premix     2 g 200 mL/hr over 30 Minutes Intravenous Every 8 hours 09/12/19 1416     08/10/19 0936  vancomycin (VANCOCIN) 1,000 mg in sodium chloride 0.9 % 1,000 mL irrigation  Status:  Discontinued       As needed 08/10/19 0937 08/10/19 1121   08/10/19 0600  ceFAZolin (ANCEF) IVPB 2g/100 mL premix     2 g 200 mL/hr over 30 Minutes Intravenous To Short Stay 08/10/19 0036 08/10/19 0625   08/09/19 1730  ceFAZolin (ANCEF) IVPB 2g/100 mL premix  Status:  Discontinued     2 g 200 mL/hr over 30 Minutes Intravenous 30 min pre-op 08/09/19 1730 08/10/19 0036   08/06/19 1430  nafcillin injection 2 g  Status:  Discontinued     2 g Intravenous Every 4 hours 08/06/19 1310 08/06/19 1359   08/06/19 1430  nafcillin 2 g in sodium chloride 0.9 % 100 mL IVPB  Status:  Discontinued     2 g 200 mL/hr over 30 Minutes Intravenous Every 4 hours 08/06/19 1359 09/12/19 1416   08/03/19 0900  fluconazole (DIFLUCAN) tablet 200 mg     200 mg Oral  Once 08/03/19 0842 08/03/19 0917   08/02/19 2200  ceFAZolin (ANCEF) IVPB 2g/100 mL premix  Status:  Discontinued     2 g 200 mL/hr over 30 Minutes Intravenous Every 8 hours 08/02/19 1534 08/06/19 1310   08/02/19 1600  metroNIDAZOLE (FLAGYL) tablet 500 mg  Status:  Discontinued     500 mg Oral Every 8 hours 08/02/19 1534 08/06/19 1454   08/01/19 1200  vancomycin (VANCOCIN) IVPB 750 mg/150 ml premix  Status:  Discontinued     750 mg 150 mL/hr over 60 Minutes Intravenous Every 8 hours 08/01/19 0224 08/02/19 1533   08/01/19  1130  ceFAZolin (ANCEF) IVPB 2g/100 mL premix     2 g 200 mL/hr over 30 Minutes Intravenous 30 min pre-op 08/01/19 0828 08/01/19 1319   08/01/19 1000  ceFEPIme (MAXIPIME) 2 g in sodium chloride 0.9 % 100 mL IVPB  Status:  Discontinued     2 g 200 mL/hr over 30 Minutes Intravenous Every 8 hours 08/01/19 0224 08/02/19 1533   08/01/19 0100  ceFEPIme (MAXIPIME) 2 g in sodium chloride 0.9 % 100 mL IVPB     2 g 200 mL/hr over 30 Minutes Intravenous  Once 08/01/19 0056 08/01/19 0335   08/01/19 0100  metroNIDAZOLE (FLAGYL) IVPB 500 mg     500 mg 100 mL/hr over 60 Minutes Intravenous  Once 08/01/19 0056 08/01/19 0335   08/01/19 0100  vancomycin (VANCOCIN) IVPB 1000 mg/200 mL premix     1,000 mg 200 mL/hr over 60 Minutes Intravenous  Once 08/01/19 0056 08/01/19 0335     Medications: Scheduled Meds: . feeding supplement (ENSURE ENLIVE)  237 mL Oral BID BM  . insulin aspart  0-15 Units Subcutaneous TID WC  . insulin aspart  0-5 Units Subcutaneous QHS  . magnesium oxide  400  mg Oral BID  . Melatonin  6 mg Oral Once  . metoprolol tartrate  12.5 mg Oral BID  . ondansetron  4 mg Intravenous Once  . oxyCODONE  2.5 mg Oral QHS  . oxyCODONE  2.5 mg Oral Daily  . potassium chloride  40 mEq Oral Once  . povidone-iodine  2 application Topical Once  . sodium chloride flush  10-40 mL Intracatheter Q12H   Continuous Infusions: . sodium chloride 1,000 mL (08/24/19 2247)  .  ceFAZolin (ANCEF) IV    .  ceFAZolin (ANCEF) IV 2 g (09/12/19 2222)  . lactated ringers 10 mL/hr at 08/10/19 0845   PRN Meds:.sodium chloride, acetaminophen, calcium carbonate, cyclobenzaprine, ibuprofen, ondansetron (ZOFRAN) IV, promethazine, sodium chloride flush, traZODone   Objective: Weight change:   Intake/Output Summary (Last 24 hours) at 09/13/2019 1042 Last data filed at 09/13/2019 0753 Gross per 24 hour  Intake 548.47 ml  Output 125 ml  Net 423.47 ml   Blood pressure 126/78, pulse 69, temperature 98.2 F (36.8  C), temperature source Oral, resp. rate 15, height  (1.88 m), weight 80.1 kg, SpO2 99 %. Temp:  [97.4 F (36.3 C)-98.2 F (36.8 C)] 98.2 F (36.8 C) (10/15 0751) Pulse Rate:  [66-79] 69 (10/15 0751) Resp:  [15-18] 15 (10/15 0751) BP: (118-135)/(78-88) 126/78 (10/15 0751) SpO2:  [99 %-100 %] 99 % (10/15 0751)  Physical Exam: General: Laying in bed comfortably  HEENT: NCAT, EOMI PULM: Normal WOB Neuro: alert and oriented, no focal deficits  CBC:    Component Value Date/Time   WBC 3.7 (L) 09/13/2019 0922   RBC 3.20 (L) 09/13/2019 0922   HGB 9.8 (L) 09/13/2019 0922   HCT 29.3 (L) 09/13/2019 0922   PLT 257 09/13/2019 0922   MCV 91.6 09/13/2019 0922   MCH 30.6 09/13/2019 0922   MCHC 33.4 09/13/2019 0922   RDW 15.4 09/13/2019 0922   LYMPHSABS 0.7 08/31/2019 1730   MONOABS 0.3 08/31/2019 1730   EOSABS 0.1 08/31/2019 1730   BASOSABS 0.0 08/31/2019 1730   BMET Recent Labs    09/12/19 1106 09/13/19 0922  NA 141 140  K 3.1* 3.3*  CL 102 105  CO2 27 25  GLUCOSE 101* 103*  BUN 11 16  CREATININE 0.82 0.72  CALCIUM 9.1 8.8*   Sedimentation Rate No results for input(s): ESRSEDRATE in the last 72 hours. C-Reactive Protein Recent Labs    09/12/19 1106  CRP 5.4*   Micro Results: No results found for this or any previous visit (from the past 720 hour(s)).  Studies/Results: Mr Humerus Right W Wo Contrast  Result Date: 09/13/2019 CLINICAL DATA:  Osteomyelitis of the humerus. EXAM: MRI OF THE RIGHT HUMERUS WITHOUT AND WITH CONTRAST TECHNIQUE: Multiplanar, multisequence MR imaging of the right humerus was performed before and after the administration of intravenous contrast. CONTRAST:  7.23mL GADAVIST GADOBUTROL 1 MMOL/ML IV SOLN COMPARISON:  Right shoulder dated 09/11/2019 FINDINGS: Bones/Joint/Cartilage There is osteomyelitis from the humeral head to the distal humeral metaphysis with an intramedullary abscess extending over a 14 cm segment of the shaft. Muscles and  Tendons There is edema and abnormal enhancement of the deltoid muscle overlying the septic bursitis described on the prior shoulder MRI. Soft tissues Septic subacromial/subdeltoid bursitis noted in the right shoulder as described on the shoulder MRI of 09/11/2019. This is essentially unchanged. IMPRESSION: Extensive osteomyelitis of the right humerus extending from the humeral head into the distal humeral metaphysis with an intramedullary abscess as described above. Septic subacromial/subdeltoid bursitis of  the right shoulder. Electronically Signed   By: Francene Boyers M.D.   On: 09/13/2019 07:58   Mr Shoulder Right W Wo Contrast  Result Date: 09/11/2019 CLINICAL DATA:  Right shoulder pain. Sepsis. Staph infection of the chest wall. EXAM: MRI OF THE RIGHT SHOULDER WITHOUT AND WITH CONTRAST TECHNIQUE: Multiplanar, multisequence MR imaging of the right shoulder was performed before and after the administration of intravenous contrast. CONTRAST:  75mL GADAVIST GADOBUTROL 1 MMOL/ML IV SOLN COMPARISON:  CT scan dated 08/09/2019 FINDINGS: Rotator cuff: There is a deep partial-thickness bursal surface tear of the distal supraspinatus tendon 3 cm in width and a maximum of 13 mm in length. The remainder of the rotator cuff is intact. Muscles:  No atrophy. Biceps long head:  Properly located and intact. Acromioclavicular Joint: Normal AC joint. Type 2 acromion. Prominent inhomogeneous fluid in the subacromial and subdeltoid bursae with intense enhancement of the margins of the bursa after contrast administration. Glenohumeral Joint: No glenohumeral joint effusion or chondral defect. Labrum:  Intact. Bones: Extensive edema and abnormal enhancement of the majority of the humeral head and in the proximal shaft with a rim enhancing fluid collection in the proximal humeral shaft. The finding is consistent with extensive osteomyelitis of the proximal humerus. Other: After contrast administration there is enhancement along the  periphery of the supraspinatus and infraspinatus muscles, probably representing infection spread along the fascia. IMPRESSION: 1. Septic subacromial/subdeltoid bursitis with a deep partial-thickness bursal surface tear of the distal supraspinatus tendon. 2. Extensive osteomyelitis of the proximal right humerus with an abscess in the medullary cavity proximal humeral shaft. 3. At this point there is no evidence of septic arthritis of the glenohumeral joint or of the [facility] joint. Electronically Signed   By: Francene Boyers M.D.   On: 09/11/2019 20:58   Assessment/Plan:  Principal Problem:   Staphylococcus aureus bacteremia Active Problems:   Sepsis (HCC)   Abscess of paraspinous muscles   Osteomyelitis of lumbar spine (HCC)   Abscess in epidural space of lumbar spine   Abnormal chest CT   Septic embolism (HCC)   Community acquired pneumonia   IV drug abuse (HCC)   Heroin abuse (HCC)   Elevated troponin   UTI (urinary tract infection)   Renal failure   Hyperglycemia   Suspected endocarditis   Chest wall abscess   Abscess of right heel   Acute osteomyelitis of lumbar spine (HCC)   Paraspinal abscess (HCC)   S/P chest tube placement (HCC)   Acute hematogenous osteomyelitis, right humerus (HCC)  In summary, Mr. Guaman is a 38 year old gentleman with past medical history significant for IV drug abuse who presented with disseminated MSSA infection. He has several abscesses located in the chest wall (s/p I&D w/ wound vac), lumbar paraspinal and epidural abcesses, sacral osteomyelitis and lumbar vertebral osteomyelitis/discitisosteomyelitis and most recently R humerus osteomyelitis.      [place] [facility] Staphylococcus aureus bacteremia   Staphylococcus aureus bacteremia (SAB) is associated with a high rate of complications and mortality.  Specific aspects of clinical management are critical to optimizing the outcome of patients with SAB.  Therefore, the [facility]  Antimicrobial Management Team [facility]) has initiated an intervention aimed at improving the management of SAB at [facility].  To do so, Infectious Diseases physicians are providing an evidence-based consult for the management of all patients with SAB.     Yes No Comments  Perform follow-up blood cultures (even if the patient is afebrile) to ensure clearance of bacteremia  Repeat blood cultures on 9/3, 9/6 and 9/10 have been negative  [x]  []    Remove vascular catheter and obtain follow-up blood cultures after the removal of the catheter [x]  []    Perform echocardiography to evaluate for endocarditis (transthoracic ECHO is 40-50% sensitive, TEE is > 90% sensitive)  Repeat blood cultures on 9/3, 9/6 and 9/10 have been negative [x]  []  Please keep in mind, that neither test can definitively EXCLUDE endocarditis, and that should clinical suspicion remain high for endocarditis the patient should then still be treated with an "endocarditis" duration of therapy = 6 weeks  Consult electrophysiologist to evaluate implanted cardiac device (pacemaker, ICD) []  []    Ensure source control TEE completed on 9/02 demonstrated no evidence of valve vegetations suggesting endocarditis   [x]  []  Have all abscesses been drained effectively? Have deep seeded infections (septic joints or osteomyelitis) had appropriate surgical debridement?  Investigate for "metastatic" sites of infection [x]  []  Does the patient have ANY symptom or physical exam finding that would suggest a deeper infection (back or neck pain that may be suggestive of vertebral osteomyelitis or epidural abscess, muscle pain that could be a symptom of pyomyositis)?  Keep in mind that for deep seeded infections MRI imaging with contrast is preferred rather than other often insensitive tests such as plain x-rays, especially early in a patient's presentation.  Change antibiotic therapy to  []  []  Beta-lactam antibiotics are preferred for MSSA due to higher cure  rates.   If on Vancomycin, goal trough should be 15 - 20 mcg/mL  Estimated duration of IV antibiotic therapy:    Continue IV cefazolin for 6 weeks is the recommendation. Pt is not thrilled about this idea, but is willing to stay for 3 weeks for IV abx then switch to oral for 3 weeks. We will continue discussing with the patient. [x]  []  Consult case management for probably prolonged outpatient IV antibiotic therapy    LOS: 43 days   Earlene Plater 09/13/2019, 10:42 AM

## 2019-09-13 NOTE — Progress Notes (Signed)
Patient refused most of the treatments since 0245am and asked not to be disturbed til 0800 am.

## 2019-09-13 NOTE — Progress Notes (Signed)
   Ortho Hand Progress Note  Subjective: Patient has had continued right shoulder pain.  He is remained on IV antibiotics for his other multiple infections his body.  Recent MRI of the shoulder showed continued subacromial bursitis as well as significant osteomyelitis of the right proximal humerus.  Humerus MRI was then obtained which showed extension of the abscess down the intramedullary canal of the humerus.  He states his pain has been controlled and his main complaint with the shoulder stiffness.  He denies numbness or tingling.  He has no other concerns today.   Objective: Vital signs in last 24 hours: Temp:  [97.8 F (36.6 C)-98.8 F (37.1 C)] 98.8 F (37.1 C) (10/15 1131) Pulse Rate:  [66-79] 71 (10/15 1131) Resp:  [15-20] 20 (10/15 1131) BP: (123-135)/(78-94) 123/94 (10/15 1131) SpO2:  [99 %-100 %] 100 % (10/15 1131) Weight:  [80.1 kg] 80.1 kg (10/15 1538)  Intake/Output from previous day: 10/14 0701 - 10/15 0700 In: 548.5 [P.O.:237; IV Piggyback:311.5] Out: -  Intake/Output this shift: Total I/O In: -  Out: 125 [Urine:125]  Recent Labs    09/13/19 0922  HGB 9.8*   Recent Labs    09/13/19 0922  WBC 3.7*  RBC 3.20*  HCT 29.3*  PLT 257   Recent Labs    09/12/19 1106 09/13/19 0922  NA 141 140  K 3.1* 3.3*  CL 102 105  CO2 27 25  BUN 11 16  CREATININE 0.82 0.72  GLUCOSE 101* 103*  CALCIUM 9.1 8.8*   No results for input(s): LABPT, INR in the last 72 hours.  Aaox3 nad Resp nonlabored RRR Examination of the right shoulder shows a grossly normal-appearing shoulder.  There is no significant swelling, erythema, lymphadenopathy or signs infection.  Patient has continued tenderness palpation anterior and posterior leg about the shoulder.  He has pain with attempted shoulder range of motion.  There is no palpable abscess or fluid collection.  His fingertips are warm well perfused with brisk capillary refill.  His sensation is intact light touch throughout the  digits.   Assessment/Plan: Right proximal humerus and humeral shaft osteomyelitis with intramedullary abscess  Plan proceed to surgery today for irrigation debridement of the right shoulder as well as debridement of the humeral canal.  The risks of surgery were discussed with him which include but are not limited to continued infection, bleeding, damage return to tract was included blood vessels and nerves, pain, stiffness, continued infection and need for additional procedures.  Informed consent was obtained at that time.  The patient's right shoulder was marked. We will plan on continuing inpatient care following procedure.  All of his questions were answered and he is happy with this plan   Avanell Shackleton III 09/13/2019, 5:20 PM  [phone]

## 2019-09-13 NOTE — Anesthesia Preprocedure Evaluation (Signed)
Anesthesia Evaluation  Patient identified by MRN, date of birth, ID band Patient awake    Reviewed: Allergy & Precautions, NPO status , Patient's Chart, lab work & pertinent test results  Airway Mallampati: I  TM Distance: >3 FB Neck ROM: Full    Dental   Pulmonary    Pulmonary exam normal        Cardiovascular Normal cardiovascular exam     Neuro/Psych    GI/Hepatic   Endo/Other    Renal/GU      Musculoskeletal   Abdominal   Peds  Hematology   Anesthesia Other Findings   Reproductive/Obstetrics                             Anesthesia Physical Anesthesia Plan  ASA: III  Anesthesia Plan: General   Post-op Pain Management:    Induction: Intravenous  PONV Risk Score and Plan: 2 and Ondansetron and Treatment may vary due to age or medical condition  Airway Management Planned: LMA  Additional Equipment:   Intra-op Plan:   Post-operative Plan: Extubation in OR  Informed Consent: I have reviewed the patients History and Physical, chart, labs and discussed the procedure including the risks, benefits and alternatives for the proposed anesthesia with the patient or authorized representative who has indicated his/her understanding and acceptance.       Plan Discussed with: CRNA and Surgeon  Anesthesia Plan Comments:         Anesthesia Quick Evaluation

## 2019-09-13 NOTE — Transfer of Care (Signed)
Immediate Anesthesia Transfer of Care Note  Patient: William [name]  Procedure(s) Performed: IRRIGATION AND DEBRIDEMENT EXTREMITY (Right Shoulder)  Patient Location: PACU  Anesthesia Type:General  Level of Consciousness: awake, alert  and oriented  Airway & Oxygen Therapy: Patient Spontanous Breathing  Post-op Assessment: Report given to RN and Post -op Vital signs reviewed and stable  Post vital signs: Reviewed and stable  Last Vitals:  Vitals Value Taken Time  BP 150/94 09/13/19 1928  Temp    Pulse 87 09/13/19 1931  Resp 13 09/13/19 1931  SpO2 100 % 09/13/19 1931  Vitals shown include unvalidated device data.  Last Pain:  Vitals:   09/13/19 1131  TempSrc: Oral  PainSc:       Patients Stated Pain Goal: 2 (01/21/35 1224)  Complications: No apparent anesthesia complications

## 2019-09-13 NOTE — Progress Notes (Signed)
PROGRESS NOTE  William [name] OVZ:[id number] DOB: 07/07/1981 DOA: 07/31/2019 PCP: Patient, No Pcp Per  HPI/Recap of past 24 hours: William [name] is a 38 y.o. M with IVDU who presented with pain in the low back, left shoulder and right foot, as well as flu-like symptoms, progressing over a few days.  In the ER, imaging showed right heel abscess, left upper chest abscess and septic emboli.  Blood cultures obtained, started on empiric antibiotics, and admitted for presumptive endocarditis and for chest wall debridement.     Interim history: 9/2 patient admitted from ER to ICU, underwent debridement of chest wall abscess in the OR, placement of wound vac. MRI lumbar spine showed paraspinal abscess (2x2x4), epidural abscesses, sacral osteomyleitis and lumbar vertebral osteomyelitis/discitis Urine, blood and wound cultures from admission all growing MSSA but thankfully repeat blood cultures 9/3, 9/6, and 9/10 all without growth Ankle abscess I&D'd on 9/4 Transitioned to nafcillin on 9/7 Right shoulder bursa aspirated on 9/11  Patient defervesced and continued on nafcillin MRI pelvis and MRI right shoulder obtained on 10/11-13 showed progression of lumbar/sacral osteo, shrinkage but persistence of paraspinal abscess and newly noted osteomyelitis with abscess of the right humeral head.  09/13/19: Patient was seen and examined at his bedside this morning.  He has no new complaints.  Denies right shoulder pain at the time of this visit.  Plan to be taken to the OR this afternoon for right humeral osteomyelitis.     Assessment/Plan: Principal Problem:   Staphylococcus aureus bacteremia Active Problems:   Sepsis ([place])   Abscess of paraspinous muscles   Osteomyelitis of lumbar spine (HCC)   Abscess in epidural space of lumbar spine   Abnormal chest CT   Septic embolism (HCC)   Community acquired pneumonia   IV drug abuse ([place])   Heroin abuse ([place])   Elevated troponin   UTI (urinary  tract infection)   Renal failure   Hyperglycemia   Suspected endocarditis   Chest wall abscess   Abscess of right heel   Acute osteomyelitis of lumbar spine (HCC)   Paraspinal abscess (HCC)   S/P chest tube placement (HCC)   Acute hematogenous osteomyelitis, right humerus (HCC)  Sepsis from MSSA bacteremia due to IVDU Lumbar osteomyelitis/discitis L2-4 Sacral osteomyelitis Paraspinal and epidural abscess No fever.  Checked with IR on 09/11/2019, paraspinal abscess is too small to aspirate, further it is improving with IV nafcillin. -Seen by infectious disease, following -Continue nafcillin for now, defer adjustments to ID -Obtain weekly CBC/BMP, CRP ESR  -Need additional 6 weeks of antibiotics TTE done on 08/01/2019 no report of vegetation  Newly diagnosed right humeral osteomyelitis with abscess of right proximal humerus/ Subacromial/subdeltoid septic bursitis in the setting of bacteremia MRI done on 09/11/2019 showed extensive right humeral head osteomyelitis, a humeral abscess and septic bursitis again. -Reconsult ID and Orthopedics, appreciate expert cares  Plan to be taken to the OR this afternoon Continue IV antibiotics Pain management as needed Abscess right ankle  Resolved  Chest wall abscess s/p operative I&D with wound vac application 9/2 and 4/12 Septic pulmonary emboli Resolving well  Newly diagnosed type II diabetes with hyperglycemia Glucose 700 at admission.  No previous diagnosis of diabetes.  No family history of diabetes.  Not obese.  A1c here 7.8%. Continue insulin sliding scale  IV drug abuse -Continue telephonic psychology counseling as needed Hepatitis C screening nonreactive, HIV nonreactive on 08/01/2019  Anemia of chronic disease -Weekly CBC  Hypertension BP controlled -Continue metoprolol,  will need rx at discharge  Other medications -Continue trazodone, Flexeril for insomnia, will need rx at discharge    MDM and disposition: The  below labs and imaging reports reviewed and summarized above.  Medication management as above.   The patient was admitted with MSSA bacteremia, septic pulmonary emboli, large chest wall abscess, right shoulder septic arthritis, and right ankle abscess.    Unfortunately, we have found now new foci of infection that will require adjustment of antibiotics and possibly orthopedic surgery.  Defer duration of antibiotics to ID, appreciate their and Orthopedics        DVT prophylaxis: SCDs for now, start Lovenox post-op Code Status: FULL Family Communication: Mother    Consultants:   CT surgery  Orthopedics  Infectious disease  Neurosurgery  Procedures:   9/2 MRI lumbar spine  9/2 I&D left chest wall abscess with wound vac by Dr. Servando Snare  9/4 I&D right heel abscess at bedside by Hilbert Odor, [facility]  9/11 Excision wound debridemnt, wound vac change by Dr. Prescott Gum  9/14 right shoulder bursa aspiration by Silvestre Gunner, PAC  10/12 MRI pelvis   10/13 MRI RIGHT shoulder  Antimicrobials:   Nafcillin   Culture data:   9/1 urine culture -- MSSA   9/2 blood culture x2 -- MSSA in 2/2  9/2 wound culture -- MSSA  9/2 chest wall abscess intraop culture -- MSSA  9/3 blood culture x2 -- NG  9/4 heel abscess culture -- MSSA  9/6 blood culture x2 -- NG  9/10 blood culture x2 -- NG  9/14 aspirate shoulder culture -- NG     Objective: Vitals:   09/12/19 2100 09/12/19 2323 09/13/19 0751 09/13/19 1131  BP: 135/85 129/88 126/78 (!) 123/94  Pulse: 79 66 69 71  Resp: _0 Temp: 97.8 F (36.6 C) 97.8 F (36.6 C) 98.2 F (36.8 C) 98.8 F (37.1 C)  TempSrc: Oral Oral Oral Oral  SpO2: 99% 100% 99% 100%  Weight:      Height:        Intake/Output Summary (Last 24 hours) at 09/13/2019 1436 Last data filed at 09/13/2019 0753 Gross per 24 hour  Intake 548.47 ml  Output 125 ml  Net 423.47 ml   Filed Weights   09/06/19 0500 09/07/19  0445 09/09/19 0500  Weight: 77.3 kg 79.5 kg 80.1 kg    Exam:   General: 38 y.o. year-old male well developed well nourished in no acute distress.  Alert and oriented x3.  Cardiovascular: Regular rate and rhythm with no rubs or gallops.  No thyromegaly or JVD noted.    Respiratory: Clear to auscultation with no wheezes or rales. Good inspiratory effort.  Abdomen: Soft nontender nondistended with normal bowel sounds x4 quadrants.  Musculoskeletal: Right shoulder joint mildly edematous.  2/4 pulses in all 4 extremities.  Psychiatry: Mood is appropriate for condition and setting   Data Reviewed: CBC: Recent Labs  Lab 09/07/19 1134 09/13/19 0922  WBC 4.3 3.7*  HGB 10.1* 9.8*  HCT 30.6* 29.3*  MCV 91.3 91.6  PLT 373 500   Basic Metabolic Panel: Recent Labs  Lab 09/07/19 1134 09/12/19 1106 09/13/19 0922  NA 140 141 140  K 3.3* 3.1* 3.3*  CL 104 102 105  CO2 _1 GLUCOSE 201* 101* 103*  BUN _2 CREATININE 0.72 0.82 0.72  CALCIUM 8.6* 9.1 8.8*   GFR: Estimated Creatinine Clearance: 141.8 mL/min (by C-G formula based on SCr of 0.72 mg/dL).  Liver Function Tests: No results for input(s): AST, ALT, ALKPHOS, BILITOT, PROT, ALBUMIN in the last 168 hours. No results for input(s): LIPASE, AMYLASE in the last 168 hours. No results for input(s): AMMONIA in the last 168 hours. Coagulation Profile: No results for input(s): INR, PROTIME in the last 168 hours. Cardiac Enzymes: No results for input(s): CKTOTAL, CKMB, CKMBINDEX, TROPONINI in the last 168 hours. BNP (last 3 results) No results for input(s): PROBNP in the last 8760 hours. HbA1C: No results for input(s): HGBA1C in the last 72 hours. CBG: Recent Labs  Lab 09/12/19 0618 09/12/19 1149 09/12/19 1715 09/12/19 2206 09/13/19 1127  GLUCAP 103* 197* 102* 181* 95   Lipid Profile: No results for input(s): CHOL, HDL, LDLCALC, TRIG, CHOLHDL, LDLDIRECT in the last 72 hours. Thyroid Function Tests: No  results for input(s): TSH, T4TOTAL, FREET4, T3FREE, THYROIDAB in the last 72 hours. Anemia Panel: No results for input(s): VITAMINB12, FOLATE, FERRITIN, TIBC, IRON, RETICCTPCT in the last 72 hours. Urine analysis:    Component Value Date/Time   COLORURINE YELLOW 08/09/2019 1845   APPEARANCEUR CLEAR 08/09/2019 1845   LABSPEC 1.029 08/09/2019 1845   PHURINE 5.0 08/09/2019 1845   GLUCOSEU 50 (A) 08/09/2019 1845   HGBUR MODERATE (A) 08/09/2019 1845   BILIRUBINUR NEGATIVE 08/09/2019 1845   KETONESUR NEGATIVE 08/09/2019 1845   PROTEINUR 30 (A) 08/09/2019 1845   NITRITE NEGATIVE 08/09/2019 1845   LEUKOCYTESUR NEGATIVE 08/09/2019 1845   Sepsis Labs: _0 (procalcitonin:4,lacticidven:4)  )No results found for this or any previous visit (from the past 240 hour(s)).    Studies: Mr Humerus Right W Wo Contrast  Result Date: 09/13/2019 CLINICAL DATA:  Osteomyelitis of the humerus. EXAM: MRI OF THE RIGHT HUMERUS WITHOUT AND WITH CONTRAST TECHNIQUE: Multiplanar, multisequence MR imaging of the right humerus was performed before and after the administration of intravenous contrast. CONTRAST:  7.31m GADAVIST GADOBUTROL 1 MMOL/ML IV SOLN COMPARISON:  Right shoulder dated 09/11/2019 FINDINGS: Bones/Joint/Cartilage There is osteomyelitis from the humeral head to the distal humeral metaphysis with an intramedullary abscess extending over a 14 cm segment of the shaft. Muscles and Tendons There is edema and abnormal enhancement of the deltoid muscle overlying the septic bursitis described on the prior shoulder MRI. Soft tissues Septic subacromial/subdeltoid bursitis noted in the right shoulder as described on the shoulder MRI of 09/11/2019. This is essentially unchanged. IMPRESSION: Extensive osteomyelitis of the right humerus extending from the humeral head into the distal humeral metaphysis with an intramedullary abscess as described above. Septic subacromial/subdeltoid bursitis of the right shoulder.  Electronically Signed   By: JLorriane ShireM.D.   On: 09/13/2019 07:58    Scheduled Meds:  feeding supplement (ENSURE ENLIVE)  237 mL Oral BID BM   insulin aspart  0-15 Units Subcutaneous TID WC   insulin aspart  0-5 Units Subcutaneous QHS   magnesium oxide  400 mg Oral BID   Melatonin  6 mg Oral Once   metoprolol tartrate  12.5 mg Oral BID   ondansetron  4 mg Intravenous Once   oxyCODONE  2.5 mg Oral QHS   oxyCODONE  2.5 mg Oral Daily   potassium chloride  40 mEq Oral Once   povidone-iodine  2 application Topical Once   sodium chloride flush  10-40 mL Intracatheter Q12H    Continuous Infusions:  sodium chloride 1,000 mL (08/24/19 2247)    ceFAZolin (ANCEF) IV      ceFAZolin (ANCEF) IV 2 g (09/13/19 1330)   lactated ringers 10 mL/hr at 08/10/19 0845  LOS: 74 days     Kayleen Memos, MD [facility] Pager [phone]  If 7PM-7AM, please contact night-coverage [email or web address] Password [email or web address] 09/13/2019, 2:36 PM

## 2019-09-13 NOTE — Progress Notes (Signed)
VAST consulted to assess midline that wouldn't flush. Took dressing down, changed cap, and pulled line back about 1.5cm where kink was visualized; able to unkink and flush line. Pt tasted saline and no swelling noted. Educated pt to notify unit RN if any burning or swelling noted with infusion. Pt verbalized understanding. Dressing, Biopatch, and Statlock changed after cleaning site per protocol. Unit RN notified of care.

## 2019-09-13 NOTE — Anesthesia Procedure Notes (Signed)
Procedure Name: Intubation Date/Time: 09/13/2019 5:46 PM Performed by: Barrington Ellison, CRNA Pre-anesthesia Checklist: Patient identified, Emergency Drugs available, Suction available and Patient being monitored Patient Re-evaluated:Patient Re-evaluated prior to induction Oxygen Delivery Method: Circle System Utilized Preoxygenation: Pre-oxygenation with 100% oxygen Induction Type: IV induction Ventilation: Mask ventilation without difficulty Laryngoscope Size: Mac and 4 Grade View: Grade II Tube type: Oral Tube size: 7.5 mm Number of attempts: 1 Airway Equipment and Method: Stylet and Oral airway Placement Confirmation: ETT inserted through vocal cords under direct vision,  positive ETCO2 and breath sounds checked- equal and bilateral Secured at: 21 cm Tube secured with: Tape Dental Injury: Teeth and Oropharynx as per pre-operative assessment

## 2019-09-13 NOTE — Anesthesia Postprocedure Evaluation (Signed)
Anesthesia Post Note  Patient: William [name]  Procedure(s) Performed: IRRIGATION AND DEBRIDEMENT EXTREMITY (Right Shoulder)     Patient location during evaluation: PACU Anesthesia Type: General Level of consciousness: awake and alert Pain management: pain level controlled Vital Signs Assessment: post-procedure vital signs reviewed and stable Respiratory status: spontaneous breathing, nonlabored ventilation, respiratory function stable and patient connected to nasal cannula oxygen Cardiovascular status: blood pressure returned to baseline and stable Postop Assessment: no apparent nausea or vomiting Anesthetic complications: no    Last Vitals:  Vitals:   09/13/19 1945 09/13/19 2000  BP: (!) 165/103 (!) 162/97  Pulse: 83 84  Resp: 20 19  Temp:    SpO2: 100% 100%    Last Pain:  Vitals:   09/13/19 2000  TempSrc:   PainSc: 8                  Ryan P Ellender

## 2019-09-13 NOTE — Progress Notes (Signed)
Patient arrived in the unit at 2020 pm from PACU after procedure, alert and oriented, seemed in pain and verbalized of feeling pain on surgical site 7/10, but already received prn med for pain still working on it, surgical site dressing is cleaned, dry, and intact, neuro check is intact, VTs are WNL with compared to previous, situated in a bed comfortably; and will continue to monitor closely.

## 2019-09-13 NOTE — Progress Notes (Signed)
  NEUROSURGERY PROGRESS NOTE   No issues overnight.  Scheduled for I&D later today. No concerns this am Endorses occassional lower back stiffness Denies radicular symptoms Ambulating in halls multiple times per day  EXAM:  BP (!) 123/94 (BP Location: Left Arm)   Pulse 71   Temp 98.8 F (37.1 C) (Oral)   Resp 20   Ht 6\' 2"  (1.88 m)   Wt 80.1 kg   SpO2 100%   BMI 22.67 kg/m   Awake, alert, oriented  Speech fluent, appropriate  CN grossly intact  5/5 BUE/BLE   IMPRESSION/PLAN 38 y.o. male with lumbar discitis osteomyelitis, epidural abscess, lumbar facet arthritis. MRI pelvis from 10/11 was reviewed. There is new septic arthropathy right L4-5 and progressive osteo/disciitis at L5-S1. Epidural abscess appears grossly resolved in lower lumbar spine.  He remains neurologically intact. Will repeat MRI L spine to assess upper lumbar spine due to prior mod central canal stenosis to ensure improvement in epidural component there as well. - No plan for NS intervention at present.  Ferne Reus, PA-C [facility] [employer]

## 2019-09-13 NOTE — Op Note (Signed)
PREOPERATIVE DIAGNOSIS:  1.  Right shoulder septic subdeltoid and acromial bursitis 2.  Right proximal humerus and humeral shaft osteomyelitis  POSTOPERATIVE DIAGNOSIS: Same  ATTENDING PHYSICIAN: Gasper Lloyd. Roney Mans, III, MD who was present and scrubbed for the entire case   ASSISTANT SURGEON: None.   ANESTHESIA: General  SURGICAL PROCEDURES:  1. Right shoulder subdeltoid and subacromial bursa radical excision 2. Saucerization of right proximal humerus 3.  Saucerization of right humeral shaft intramedullary canal  SURGICAL INDICATIONS: Patient is a 38 year old male who has been in inpatient for an extended period at this point.  He had a prior septic subdeltoid and acromial bursitis which was treated initially with IV antibiotics.  He had continued pain and dysfunction to the right shoulder and a subsequent MRI was obtained of the shoulder and humerus which showed extensive osteomyelitis of the proximal humerus which had a separate and extensive abscess within the intramedullary canal.  After discussing treatment options with the patient he did wish to proceed with operative intervention today.  FINDING: There is a significantly thickened subdeltoid and subacromial bursa which had thin purulent fluid as well as thick, infectious material within the substance.  Successful, radical excision of the subdeltoid and subacromial bursa was achieved.  There was a breach in the proximal cortex at the greater tuberosity with a partial width, partial-thickness tear of the supraspinatus tendon.  The vast majority the tendon was intact.  Through this breaching the cortex the intramedullary canal and proximal humeral head were saucerized successfully.  DESCRIPTION OF PROCEDURE: Patient was identified in the preop holding area where the risk benefits and alternatives of the procedure were discussed with patient.  These include but not limited to infection, bleeding, damage to surrounding structures including  blood vessels and nerves, pain, stiffness, fracture, recurrence and need for additional procedures.  Informed consent was obtained at that time and the patient's right arm was marked with surgery marking pen.  He was then brought back to the operative suite where a timeout was performed identifying the correct patient operative site.  He was positioned in the beachchair position and induced under general anesthesia.  The right upper extremity was then prepped and draped in usual sterile fashion.  A standard deltopectoral approach was utilized with longitudinal incision made starting at the clavicle and extending distally over the coracoid.  Electrocautery was used to dissect through the subcutaneous tissues.  The cephalic vein was identified and mobilized laterally developing the deltopectoral interval.  The subdeltoid space was established and retractors were placed.  There was a significantly thickened bursa over top of the humeral head.  This was opened and there was expression of thick chalky material as well as thin purulent fluid.  This was sent for cultures both aerobic and anaerobic.  Pickups, scissors as well as curette and rongeur were used to extensively debride and remove this infected bursa.  This includes within the subdeltoid and subacromial spaces.  Upon completing the bursectomy there was found to be a breach in the proximal cortex at the greater tuberosity.  There was a partial detachment of the supraspinatus in the area of the cortical breach.  The overall majority of the tendon was still intact throughout the remaining portion of the greater tuberosity.  This area was debrided using a rongeur and curette removing bone which was infected appearing and was sent for bone biopsy.  At this point the Synthes humeral nail reaming set was used to enter the humeral canal.  The canal was reamed gently  throughout its extent.  Further purulent material was expressed throughout reaming which was also  sent for cultures of both aerobic and anaerobic.  Reaming was complete when there was no further purulent material expressed.  At this point antibiotic impregnated normal saline was flushed through the intramedullary canal as well as into the subdeltoid and subacromial space.  3 L of normal saline were then used to cleanse the wound using pulse lavage.  Stimulan beads with vancomycin and gentamicin were then impacted into the humeral canal and proximal humerus.  Excess beads were placed into the subdeltoid and subacromial space.  A drain was placed into the subdeltoid space.  The deltopectoral fascia was then closed with a running 0 PDS suture.  The skin was closed with deep 2-0 PDS suture followed by staples.  Xeroform, 4 x 4's, ABD and Medipore tape were then applied to the shoulder.  The arm was placed into a shoulder immobilizer.  The patient was awoken from his anesthesia and explained in the operating with [facility].  He tolerated procedure well there were no complications.  ESTIMATED BLOOD LOSS: 200 mL  TOURNIQUET TIME: None  SPECIMENS: Aerobic and anaerobic cultures x2, proximal humerus bone biopsy  POSTOPERATIVE PLAN: The patient will continue his inpatient care.  We will follow up his cultures and continue with his IV antibiotics as recommended by infectious disease.  I would like him to rest in a shoulder immobilizer but he is okay to start some gentle early shoulder range of motion exercises with therapy as his pain improves.  I would like him to remain nonweightbearing to the right upper extremity  IMPLANTS: Stimulan beads impregnated with vancomycin and gentamicin

## 2019-09-14 ENCOUNTER — Encounter (HOSPITAL_COMMUNITY): Payer: Self-pay | Admitting: Orthopaedic Surgery

## 2019-09-14 DIAGNOSIS — M869 Osteomyelitis, unspecified: Secondary | ICD-10-CM

## 2019-09-14 LAB — GLUCOSE, CAPILLARY
Glucose-Capillary: 239 mg/dL — ABNORMAL HIGH (ref 70–99)
Glucose-Capillary: 141 mg/dL — ABNORMAL HIGH (ref 70–99)

## 2019-09-14 MED ORDER — POTASSIUM CHLORIDE CRYS ER 20 MEQ PO TBCR
40.0000 meq | EXTENDED_RELEASE_TABLET | Freq: Once | ORAL | Status: AC
  Administered 2019-09-14: 40 meq via ORAL
  Filled 2019-09-14: qty 2

## 2019-09-14 MED ORDER — INSULIN ASPART 100 UNIT/ML ~~LOC~~ SOLN
0.0000 [IU] | Freq: Every day | SUBCUTANEOUS | Status: DC
  Administered 2019-09-25 – 2019-10-01 (×3): 2 [IU] via SUBCUTANEOUS

## 2019-09-14 MED ORDER — INSULIN ASPART 100 UNIT/ML ~~LOC~~ SOLN
0.0000 [IU] | Freq: Three times a day (TID) | SUBCUTANEOUS | Status: DC
  Administered 2019-09-14: 4 [IU] via SUBCUTANEOUS
  Administered 2019-09-15: 13:00:00 3 [IU] via SUBCUTANEOUS
  Administered 2019-09-15: 18:00:00 4 [IU] via SUBCUTANEOUS
  Administered 2019-09-16: 7 [IU] via SUBCUTANEOUS
  Administered 2019-09-16: 3 [IU] via SUBCUTANEOUS
  Administered 2019-09-17 (×2): 4 [IU] via SUBCUTANEOUS
  Administered 2019-09-17: 3 [IU] via SUBCUTANEOUS
  Administered 2019-09-18: 13:00:00 7 [IU] via SUBCUTANEOUS
  Administered 2019-09-19: 4 [IU] via SUBCUTANEOUS
  Administered 2019-09-19 – 2019-09-20 (×2): 3 [IU] via SUBCUTANEOUS
  Administered 2019-09-21 (×2): 4 [IU] via SUBCUTANEOUS
  Administered 2019-09-21: 3 [IU] via SUBCUTANEOUS
  Administered 2019-09-22: 4 [IU] via SUBCUTANEOUS
  Administered 2019-09-25: 3 [IU] via SUBCUTANEOUS
  Administered 2019-09-27 (×2): 4 [IU] via SUBCUTANEOUS
  Administered 2019-09-28 (×2): 3 [IU] via SUBCUTANEOUS
  Administered 2019-09-29: 7 [IU] via SUBCUTANEOUS
  Administered 2019-09-30: 3 [IU] via SUBCUTANEOUS
  Administered 2019-10-01: 4 [IU] via SUBCUTANEOUS
  Administered 2019-10-02 (×2): 3 [IU] via SUBCUTANEOUS

## 2019-09-14 MED ORDER — OXYCODONE HCL 5 MG PO TABS
10.0000 mg | ORAL_TABLET | ORAL | Status: DC | PRN
  Administered 2019-09-14 – 2019-09-17 (×6): 10 mg via ORAL
  Filled 2019-09-14 (×6): qty 2

## 2019-09-14 NOTE — Consult Note (Addendum)
Subjective: Pt seen at the bedside on rounds this afternoon. Post op day 1. Pt says he has some shoulder pain but overall his pain is well controlled. No other complaints at this time.  Antibiotics:  Anti-infectives (From admission, onward)   Start     Dose/Rate Route Frequency Ordered Stop   09/13/19 1813  gentamicin (GARAMYCIN) injection  Status:  Discontinued       As needed 09/13/19 1814 09/13/19 1923   09/13/19 1811  vancomycin (VANCOCIN) powder  Status:  Discontinued       As needed 09/13/19 1812 09/13/19 1923   09/13/19 1732  polymyxin B 500,000 Units, bacitracin 50,000 Units in sodium chloride 0.9 % 500 mL irrigation  Status:  Discontinued       As needed 09/13/19 1733 09/13/19 1923   09/13/19 0945  ceFAZolin (ANCEF) IVPB 2g/100 mL premix     2 g 200 mL/hr over 30 Minutes Intravenous On call to O.R. 09/13/19 0938 09/13/19 1800   09/12/19 1500  ceFAZolin (ANCEF) IVPB 2g/100 mL premix     2 g 200 mL/hr over 30 Minutes Intravenous Every 8 hours 09/12/19 1416     08/10/19 0936  vancomycin (VANCOCIN) 1,000 mg in sodium chloride 0.9 % 1,000 mL irrigation  Status:  Discontinued       As needed 08/10/19 0937 08/10/19 1121   08/10/19 0600  ceFAZolin (ANCEF) IVPB 2g/100 mL premix     2 g 200 mL/hr over 30 Minutes Intravenous To Short Stay 08/10/19 0036 08/10/19 0625   08/09/19 1730  ceFAZolin (ANCEF) IVPB 2g/100 mL premix  Status:  Discontinued     2 g 200 mL/hr over 30 Minutes Intravenous 30 min pre-op 08/09/19 1730 08/10/19 0036   08/06/19 1430  nafcillin injection 2 g  Status:  Discontinued     2 g Intravenous Every 4 hours 08/06/19 1310 08/06/19 1359   08/06/19 1430  nafcillin 2 g in sodium chloride 0.9 % 100 mL IVPB  Status:  Discontinued     2 g 200 mL/hr over 30 Minutes Intravenous Every 4 hours 08/06/19 1359 09/12/19 1416   08/03/19 0900  fluconazole (DIFLUCAN) tablet 200 mg     200 mg Oral  Once 08/03/19 0842 08/03/19 0917   08/02/19 2200  ceFAZolin (ANCEF) IVPB 2g/100  mL premix  Status:  Discontinued     2 g 200 mL/hr over 30 Minutes Intravenous Every 8 hours 08/02/19 1534 08/06/19 1310   08/02/19 1600  metroNIDAZOLE (FLAGYL) tablet 500 mg  Status:  Discontinued     500 mg Oral Every 8 hours 08/02/19 1534 08/06/19 1454   08/01/19 1200  vancomycin (VANCOCIN) IVPB 750 mg/150 ml premix  Status:  Discontinued     750 mg 150 mL/hr over 60 Minutes Intravenous Every 8 hours 08/01/19 0224 08/02/19 1533   08/01/19 1130  ceFAZolin (ANCEF) IVPB 2g/100 mL premix     2 g 200 mL/hr over 30 Minutes Intravenous 30 min pre-op 08/01/19 0828 08/01/19 1319   08/01/19 1000  ceFEPIme (MAXIPIME) 2 g in sodium chloride 0.9 % 100 mL IVPB  Status:  Discontinued     2 g 200 mL/hr over 30 Minutes Intravenous Every 8 hours 08/01/19 0224 08/02/19 1533   08/01/19 0100  ceFEPIme (MAXIPIME) 2 g in sodium chloride 0.9 % 100 mL IVPB     2 g 200 mL/hr over 30 Minutes Intravenous  Once 08/01/19 0056 08/01/19 0335   08/01/19 0100  metroNIDAZOLE (FLAGYL) IVPB 500 mg  500 mg 100 mL/hr over 60 Minutes Intravenous  Once 08/01/19 0056 08/01/19 0335   08/01/19 0100  vancomycin (VANCOCIN) IVPB 1000 mg/200 mL premix     1,000 mg 200 mL/hr over 60 Minutes Intravenous  Once 08/01/19 0056 08/01/19 0335     Medications: Scheduled Meds: . feeding supplement (ENSURE ENLIVE)  237 mL Oral BID BM  . insulin aspart  0-15 Units Subcutaneous TID WC  . insulin aspart  0-5 Units Subcutaneous QHS  . magnesium oxide  400 mg Oral BID  . Melatonin  6 mg Oral Once  . metoprolol tartrate  12.5 mg Oral BID  . ondansetron  4 mg Intravenous Once  . oxyCODONE  2.5 mg Oral QHS  . oxyCODONE  2.5 mg Oral Daily  . potassium chloride  40 mEq Oral Once  . sodium chloride flush  10-40 mL Intracatheter Q12H   Continuous Infusions: . sodium chloride 1,000 mL (08/24/19 2247)  .  ceFAZolin (ANCEF) IV 2 g (09/14/19 0557)  . lactated ringers 10 mL/hr at 08/10/19 0845   PRN Meds:.sodium chloride, acetaminophen,  calcium carbonate, cyclobenzaprine, ibuprofen, ondansetron (ZOFRAN) IV, promethazine, sodium chloride flush, traZODone   Objective: Weight change:   Intake/Output Summary (Last 24 hours) at 09/14/2019 1128 Last data filed at 09/14/2019 0640 Gross per 24 hour  Intake 1520 ml  Output 1150 ml  Net 370 ml   Blood pressure (!) 151/79, pulse 100, temperature 97.7 F (36.5 C), temperature source Oral, resp. rate 20, height 6' 2.02" (1.88 m), weight 80.1 kg, SpO2 99 %. Temp:  [97.4 F (36.3 C)-98.8 F (37.1 C)] 97.7 F (36.5 C) (10/15 2030) Pulse Rate:  [71-100] 100 (10/15 2216) Resp:  [14-20] 20 (10/15 2030) BP: (123-165)/(79-103) 151/79 (10/15 2216) SpO2:  [99 %-100 %] 99 % (10/15 2030) Weight:  [80.1 kg] 80.1 kg (10/15 1538)  Physical Exam: General: Resting in bed comfortably  HEENT: NCAT, EOMI PULM: Normal WOB MSK: R shoulder in sling with padding.  Neuro: Alert and oriented   CBC:    Component Value Date/Time   WBC 3.7 (L) 09/13/2019 0922   RBC 3.20 (L) 09/13/2019 0922   HGB 9.8 (L) 09/13/2019 0922   HCT 29.3 (L) 09/13/2019 0922   PLT 257 09/13/2019 0922   MCV 91.6 09/13/2019 0922   MCH 30.6 09/13/2019 0922   MCHC 33.4 09/13/2019 0922   RDW 15.4 09/13/2019 0922   LYMPHSABS 0.7 08/31/2019 1730   MONOABS 0.3 08/31/2019 1730   EOSABS 0.1 08/31/2019 1730   BASOSABS 0.0 08/31/2019 1730   BMET Recent Labs    09/12/19 1106 09/13/19 0922  NA 141 140  K 3.1* 3.3*  CL 102 105  CO2 27 25  GLUCOSE 101* 103*  BUN 11 16  CREATININE 0.82 0.72  CALCIUM 9.1 8.8*   Sedimentation Rate Recent Labs    09/13/19 0922  ESRSEDRATE 63*   C-Reactive Protein Recent Labs    09/12/19 1106  CRP 5.4*   Micro Results: Recent Results (from the past 720 hour(s))  Aerobic/Anaerobic Culture (surgical/deep wound)     Status: None (Preliminary result)   Collection Time: 09/13/19  6:17 PM   Specimen: Soft Tissue, Other  Result Value Ref Range Status   Specimen Description TISSUE   Final   Special Requests RIGHT SUBACROMIAL BURSA ID A  Final   Gram Stain   Final    FEW WBC PRESENT, PREDOMINANTLY PMN NO ORGANISMS SEEN Performed at [facility], [address], [place], [place] [address]  Culture PENDING  Incomplete   Report Status PENDING  Incomplete  Aerobic/Anaerobic Culture (surgical/deep wound)     Status: None (Preliminary result)   Collection Time: 09/13/19  6:34 PM   Specimen: Soft Tissue, Other  Result Value Ref Range Status   Specimen Description ABSCESS  Final   Special Requests RIGHT HUMERAL INTRAMEDULLARY CANAL ID B  Final   Gram Stain   Final    RARE WBC PRESENT, PREDOMINANTLY PMN NO ORGANISMS SEEN Performed at [facility], [place] [address] [place], [place] [address]    Culture PENDING  Incomplete   Report Status PENDING  Incomplete  Aerobic/Anaerobic Culture (surgical/deep wound)     Status: None (Preliminary result)   Collection Time: 09/13/19  6:47 PM   Specimen: Bone; Tissue  Result Value Ref Range Status   Specimen Description BONE  Final   Special Requests HUMERAL HEAD ID C  Final   Gram Stain   Final    RARE WBC PRESENT, PREDOMINANTLY PMN NO ORGANISMS SEEN Performed at [facility], [place] [address], [place], [place] [address]    Culture PENDING  Incomplete   Report Status PENDING  Incomplete   Studies/Results: Mr Humerus Right W Wo Contrast  Result Date: 09/13/2019 CLINICAL DATA:  Osteomyelitis of the humerus. EXAM: MRI OF THE RIGHT HUMERUS WITHOUT AND WITH CONTRAST TECHNIQUE: Multiplanar, multisequence MR imaging of the right humerus was performed before and after the administration of intravenous contrast. CONTRAST:  7.78mL GADAVIST GADOBUTROL 1 MMOL/ML IV SOLN COMPARISON:  Right shoulder dated 09/11/2019 FINDINGS: Bones/Joint/Cartilage There is osteomyelitis from the humeral head to the distal humeral metaphysis with an intramedullary abscess extending over a 14 cm segment of the shaft. Muscles and Tendons There is  edema and abnormal enhancement of the deltoid muscle overlying the septic bursitis described on the prior shoulder MRI. Soft tissues Septic subacromial/subdeltoid bursitis noted in the right shoulder as described on the shoulder MRI of 09/11/2019. This is essentially unchanged. IMPRESSION: Extensive osteomyelitis of the right humerus extending from the humeral head into the distal humeral metaphysis with an intramedullary abscess as described above. Septic subacromial/subdeltoid bursitis of the right shoulder. Electronically Signed   By: Lorriane Shire M.D.   On: 09/13/2019 07:58   Assessment/Plan:  Principal Problem:   Staphylococcus aureus bacteremia Active Problems:   Sepsis ([place])   Abscess of paraspinous muscles   Osteomyelitis of lumbar spine (HCC)   Abscess in epidural space of lumbar spine   Abnormal chest CT   Septic embolism (HCC)   Community acquired pneumonia   IV drug abuse ([place])   Heroin abuse ([place])   Elevated troponin   UTI (urinary tract infection)   Renal failure   Hyperglycemia   Suspected endocarditis   Chest wall abscess   Abscess of right heel   Acute osteomyelitis of lumbar spine (HCC)   Paraspinal abscess (HCC)   S/P chest tube placement (HCC)   Acute hematogenous osteomyelitis, right humerus ([place])  In summary, Mr. Zeiner is a 38 year old gentleman with past medical history significant for IV drug abuse who presented with disseminated MSSA infection. He has several abscesses located in the chest wall (s/p I&D w/ wound vac), lumbar paraspinal and epidural abcesses, sacral osteomyelitis and lumbar vertebral osteomyelitis/discitisosteomyelitis and most recently R humerus osteomyelitis.      [place] [facility] Staphylococcus aureus bacteremia   Staphylococcus aureus bacteremia (SAB) is associated with a high rate of complications and mortality.  Specific aspects of clinical management are  critical to optimizing the outcome of patients with SAB.   Therefore, the [facility] Antimicrobial Management Team [facility]) has initiated an intervention aimed at improving the management of SAB at [facility].  To do so, Infectious Diseases physicians are providing an evidence-based consult for the management of all patients with SAB.     Yes No Comments  Perform follow-up blood cultures (even if the patient is afebrile) to ensure clearance of bacteremia  Repeat blood cultures on 9/3, 9/6 and 9/10 have been negative  [x]  []    Remove vascular catheter and obtain follow-up blood cultures after the removal of the catheter [x]  []    Perform echocardiography to evaluate for endocarditis (transthoracic ECHO is 40-50% sensitive, TEE is > 90% sensitive)  Repeat blood cultures on 9/3, 9/6 and 9/10 have been negative [x]  []  Please keep in mind, that neither test can definitively EXCLUDE endocarditis, and that should clinical suspicion remain high for endocarditis the patient should then still be treated with an "endocarditis" duration of therapy = 6 weeks  Consult electrophysiologist to evaluate implanted cardiac device (pacemaker, ICD) []  []    Ensure source control TEE completed on 9/02 demonstrated no evidence of valve vegetations suggesting endocarditis   [x]  []  Have all abscesses been drained effectively? Have deep seeded infections (septic joints or osteomyelitis) had appropriate surgical debridement?  Investigate for "metastatic" sites of infection [x]  []  Does the patient have ANY symptom or physical exam finding that would suggest a deeper infection (back or neck pain that may be suggestive of vertebral osteomyelitis or epidural abscess, muscle pain that could be a symptom of pyomyositis)?  Keep in mind that for deep seeded infections MRI imaging with contrast is preferred rather than other often insensitive tests such as plain x-rays, especially early in a patient's presentation.  Change antibiotic therapy to  []  []  Beta-lactam antibiotics are preferred for  MSSA due to higher cure rates.   If on Vancomycin, goal trough should be 15 - 20 mcg/mL  Estimated duration of IV antibiotic therapy:    Continue IV cefazolin for 6 weeks is the recommendation. Pt is not thrilled about this idea, but is willing to stay for 3 weeks for IV abx then switch to oral for 3 weeks. We will continue discussing with the patient while he is here. [x]  []  Consult case management for probably prolonged outpatient IV antibiotic therapy    LOS: 44 days   Kirt BoysAndrew Kamali Sakata, MD Internal Medicine, PGY1 Pager: [phone]  09/14/2019,3:08 PM

## 2019-09-14 NOTE — Progress Notes (Signed)
PROGRESS NOTE  William [name] ERD:[id number] DOB: 10/05/1981 DOA: 07/31/2019 PCP: Patient, No Pcp Per  HPI/Recap of past 24 hours: William [name] is a 38 y.o. M with IVDU who presented with pain in the low back, left shoulder and right foot, as well as flu-like symptoms, progressing over a few days.  In the ER, imaging showed right heel abscess, left upper chest abscess and septic emboli.  Blood cultures obtained, started on empiric antibiotics, and admitted for presumptive endocarditis and for chest wall debridement.     Interim history: 9/2 patient admitted from ER to ICU, underwent debridement of chest wall abscess in the OR, placement of wound vac. MRI lumbar spine showed paraspinal abscess (2x2x4), epidural abscesses, sacral osteomyleitis and lumbar vertebral osteomyelitis/discitis Urine, blood and wound cultures from admission all growing MSSA but thankfully repeat blood cultures 9/3, 9/6, and 9/10 all without growth Ankle abscess I&D'd on 9/4 Transitioned to nafcillin on 9/7 Right shoulder bursa aspirated on 9/11  Patient defervesced and continued on nafcillin MRI pelvis and MRI right shoulder obtained on 10/11-13 showed progression of lumbar/sacral osteo, shrinkage but persistence of paraspinal abscess and newly noted osteomyelitis with abscess of the right humeral head.  09/13/19: Patient was seen and examined at his bedside this morning.  He has no new complaints.  Denies right shoulder pain at the time of this visit.  Plan to be taken to the OR this afternoon for right humeral osteomyelitis.   09/14/19: POD# 1 post I&D R shoulder. Patient seen and examined at bedside this morning.  No acute events overnight.  This morning he has no complaints.  Denies any pain.  Early afternoon received call from bedside RN regarding patient wanting more pain medications.  Oxycodone started 10 mg as needed for moderate to severe pain every 4 hours.    Assessment/Plan: Principal Problem:   Staphylococcus aureus bacteremia Active Problems:   Sepsis ([place])   Abscess of paraspinous muscles   Osteomyelitis of lumbar spine (HCC)   Abscess in epidural space of lumbar spine   Abnormal chest CT   Septic embolism (HCC)   Community acquired pneumonia   IV drug abuse ([place])   Heroin abuse ([place])   Elevated troponin   UTI (urinary tract infection)   Renal failure   Hyperglycemia   Suspected endocarditis   Chest wall abscess   Abscess of right heel   Acute osteomyelitis of lumbar spine (HCC)   Paraspinal abscess (HCC)   S/P chest tube placement (HCC)   Acute hematogenous osteomyelitis, right humerus (HCC)  Sepsis from MSSA bacteremia due to IVDA Lumbar osteomyelitis/discitis L2-4 Sacral osteomyelitis Paraspinal and epidural abscess Checked with IR on 09/11/2019, paraspinal abscess is too small to aspirate, further it is improving with IV nafcillin. -Seen by infectious disease, following -Continue nafcillin for now, defer adjustments to ID -Obtain weekly CBC/BMP, CRP ESR  -Need additional 6 weeks of antibiotics TTE done on 08/01/2019 no report of vegetation  POD #1 post I&D of newly diagnosed right humeral osteomyelitis with abscess of right proximal humerus/ Subacromial/subdeltoid septic bursitis in the setting of bacteremia MRI done on 09/11/2019 showed extensive right humeral head osteomyelitis, a humeral abscess and septic bursitis again. -Reconsult ID and Orthopedics, appreciate expert cares  Plan to be taken to the OR this afternoon Continue IV antibiotics Continue to optimize pain control Oxycodone 10 mg every 4 hours as needed for moderate to severe pain Control type 2 diabetes with hyperglycemia  Abscess right ankle  Resolved  Chest wall  abscess s/p operative I&D with wound vac application 9/2 and 4/97 Septic pulmonary emboli Resolving well  Newly diagnosed type II diabetes with hyperglycemia Glucose 700 at admission.  No previous diagnosis of diabetes.  No  family history of diabetes.  Not obese.  A1c here 7.8%. Increase sliding scale to resistant  IV drug abuse -Continue telephonic psychology counseling as needed Hepatitis C screening nonreactive, HIV nonreactive on 08/01/2019  Anemia of chronic disease -Weekly CBC  Hypertension BP controlled -Continue metoprolol, will need rx at discharge  Other medications -Continue trazodone, Flexeril for insomnia, will need rx at discharge    MDM and disposition: The below labs and imaging reports reviewed and summarized above.  Medication management as above.   The patient was admitted with MSSA bacteremia, septic pulmonary emboli, large chest wall abscess, right shoulder septic arthritis, and right ankle abscess.    Unfortunately, we have found now new foci of infection that will require adjustment of antibiotics and possibly orthopedic surgery.  Defer duration of antibiotics to ID, appreciate their and Orthopedics        DVT prophylaxis: SCDs for now, start Lovenox post-op Code Status: FULL Family Communication: Mother    Consultants:   CT surgery  Orthopedics  Infectious disease  Neurosurgery  Procedures:   9/2 MRI lumbar spine  9/2 I&D left chest wall abscess with wound vac by Dr. Servando Snare  9/4 I&D right heel abscess at bedside by Hilbert Odor, [facility]  9/11 Excision wound debridemnt, wound vac change by Dr. Prescott Gum  9/14 right shoulder bursa aspiration by Silvestre Gunner, PAC  10/12 MRI pelvis   10/13 MRI RIGHT shoulder  Antimicrobials:   Nafcillin   Culture data:   9/1 urine culture -- MSSA   9/2 blood culture x2 -- MSSA in 2/2  9/2 wound culture -- MSSA  9/2 chest wall abscess intraop culture -- MSSA  9/3 blood culture x2 -- NG  9/4 heel abscess culture -- MSSA  9/6 blood culture x2 -- NG  9/10 blood culture x2 -- NG  9/14 aspirate shoulder culture -- NG     Objective: Vitals:   09/13/19 2000 09/13/19 2030  09/13/19 2216 09/14/19 1235  BP: (!) 162/97 (!) 151/79 (!) 151/79 (!) 144/85  Pulse: 84 100 100 85  Resp: 19 20    Temp:  97.7 F (36.5 C)  98.5 F (36.9 C)  TempSrc:  Oral  Oral  SpO2: 100% 99%  99%  Weight:      Height:        Intake/Output Summary (Last 24 hours) at 09/14/2019 1416 Last data filed at 09/14/2019 0640 Gross per 24 hour  Intake 1520 ml  Output 1150 ml  Net 370 ml   Filed Weights   09/07/19 0445 09/09/19 0500 09/13/19 1538  Weight: 79.5 kg 80.1 kg 80.1 kg    Exam:  . General: 38 y.o. year-old male well-developed well-nourished no acute distress.  Somnolent but easily arousable to voices.  . Cardiovascular: Regular rate and rhythm no rubs or gallops no JVD or thyromegaly . Respiratory: Clear to station no wheezes or rales.  Poor inspiratory effort. . Abdomen: Soft nontender nondistended normal bowel sounds present.   . Musculoskeletal: Right shoulder in surgical dressing.   Marland Kitchen Psychiatry: Mood is appropriate for condition and setting.   Data Reviewed: CBC: Recent Labs  Lab 09/13/19 0922  WBC 3.7*  HGB 9.8*  HCT 29.3*  MCV 91.6  PLT 026   Basic Metabolic Panel: Recent Labs  Lab  09/12/19 1106 09/13/19 0922  NA 141 140  K 3.1* 3.3*  CL 102 105  CO2 27 25  GLUCOSE 101* 103*  BUN 11 16  CREATININE 0.82 0.72  CALCIUM 9.1 8.8*   GFR: Estimated Creatinine Clearance: 141.8 mL/min (by C-G formula based on SCr of 0.72 mg/dL). Liver Function Tests: No results for input(s): AST, ALT, ALKPHOS, BILITOT, PROT, ALBUMIN in the last 168 hours. No results for input(s): LIPASE, AMYLASE in the last 168 hours. No results for input(s): AMMONIA in the last 168 hours. Coagulation Profile: No results for input(s): INR, PROTIME in the last 168 hours. Cardiac Enzymes: No results for input(s): CKTOTAL, CKMB, CKMBINDEX, TROPONINI in the last 168 hours. BNP (last 3 results) No results for input(s): PROBNP in the last 8760 hours. HbA1C: No results for input(s):  HGBA1C in the last 72 hours. CBG: Recent Labs  Lab 09/12/19 2206 09/13/19 1127 09/13/19 1555 09/13/19 2211 09/14/19 1233  GLUCAP 181* 95 90 315* 239*   Lipid Profile: No results for input(s): CHOL, HDL, LDLCALC, TRIG, CHOLHDL, LDLDIRECT in the last 72 hours. Thyroid Function Tests: No results for input(s): TSH, T4TOTAL, FREET4, T3FREE, THYROIDAB in the last 72 hours. Anemia Panel: No results for input(s): VITAMINB12, FOLATE, FERRITIN, TIBC, IRON, RETICCTPCT in the last 72 hours. Urine analysis:    Component Value Date/Time   COLORURINE YELLOW 08/09/2019 1845   APPEARANCEUR CLEAR 08/09/2019 1845   LABSPEC 1.029 08/09/2019 1845   PHURINE 5.0 08/09/2019 1845   GLUCOSEU 50 (A) 08/09/2019 1845   HGBUR MODERATE (A) 08/09/2019 1845   BILIRUBINUR NEGATIVE 08/09/2019 1845   KETONESUR NEGATIVE 08/09/2019 1845   PROTEINUR 30 (A) 08/09/2019 1845   NITRITE NEGATIVE 08/09/2019 1845   LEUKOCYTESUR NEGATIVE 08/09/2019 1845   Sepsis Labs: '@[email or web address]' (procalcitonin:4,lacticidven:4)  ) Recent Results (from the past 240 hour(s))  Aerobic/Anaerobic Culture (surgical/deep wound)     Status: None (Preliminary result)   Collection Time: 09/13/19  6:17 PM   Specimen: Soft Tissue, Other  Result Value Ref Range Status   Specimen Description TISSUE  Final   Special Requests RIGHT SUBACROMIAL BURSA ID A  Final   Gram Stain   Final    FEW WBC PRESENT, PREDOMINANTLY PMN NO ORGANISMS SEEN    Culture   Final    NO GROWTH < 12 HOURS Performed at [facility], [place] [address], [place], [place] [address]    Report Status PENDING  Incomplete  Aerobic/Anaerobic Culture (surgical/deep wound)     Status: None (Preliminary result)   Collection Time: 09/13/19  6:34 PM   Specimen: Soft Tissue, Other  Result Value Ref Range Status   Specimen Description ABSCESS  Final   Special Requests RIGHT HUMERAL INTRAMEDULLARY CANAL ID B  Final   Gram Stain   Final    RARE WBC PRESENT, PREDOMINANTLY PMN NO  ORGANISMS SEEN    Culture   Final    NO GROWTH < 12 HOURS Performed at [facility], [place] [address], [place], [place] [address]    Report Status PENDING  Incomplete  Aerobic/Anaerobic Culture (surgical/deep wound)     Status: None (Preliminary result)   Collection Time: 09/13/19  6:47 PM   Specimen: Bone; Tissue  Result Value Ref Range Status   Specimen Description BONE  Final   Special Requests HUMERAL HEAD ID C  Final   Gram Stain   Final    RARE WBC PRESENT, PREDOMINANTLY PMN NO ORGANISMS SEEN    Culture   Final  NO GROWTH < 12 HOURS Performed at [place] [address] [place], [place] [address]    Report Status PENDING  Incomplete      Studies: No results found.  Scheduled Meds: . feeding supplement (ENSURE ENLIVE)  237 mL Oral BID BM  . insulin aspart  0-15 Units Subcutaneous TID WC  . insulin aspart  0-5 Units Subcutaneous QHS  . magnesium oxide  400 mg Oral BID  . Melatonin  6 mg Oral Once  . metoprolol tartrate  12.5 mg Oral BID  . ondansetron  4 mg Intravenous Once  . oxyCODONE  2.5 mg Oral QHS  . potassium chloride  40 mEq Oral Once  . sodium chloride flush  10-40 mL Intracatheter Q12H    Continuous Infusions: . sodium chloride 1,000 mL (08/24/19 2247)  .  ceFAZolin (ANCEF) IV 2 g (09/14/19 1402)  . lactated ringers 10 mL/hr at 08/10/19 0845     LOS: 46 days     Kayleen Memos, MD [facility] Pager [phone]  If 7PM-7AM, please contact night-coverage [email or web address] Password [email or web address] 09/14/2019, 2:16 PM

## 2019-09-14 NOTE — Progress Notes (Signed)
Physical Therapy Treatment Patient Details Name: William [name] MRN: [id number] DOB: 11/26/1981 Today's Date: 09/14/2019    History of Present Illness Patient is a 38 y/o male who presents with lower back pain, left shoulder pain, right foot and heel pain. Found to have right heel abscess s/p I&D 9/4, left upper chest wall abscess and mass s/p I&D and wound vac application 9/3, new onset DM, sepsis with MSSA bacteremia due to IV drug use. CT scan- left anterior chest wall soft tissue and intramuscular infection with extension into the thoracic cavity, air in the soft tissues of the right axilla adjacent to the right shoulder, multiple small pulmonary nodules. PMH- heroin IV drug use.    PT Comments    Pt tolerated treatment well, electing to defer functional mobility and initiate UE exercise s/p surgery. Pt tolerating PROM of RUE well. Pt accepting of PT education on use of sling and allowing time for elbow ROM when out of sling. Pt will continue to benefit from PT POC to progress functional mobility and aide in a return of shoulder ROM and strength per physician protocol.   Follow Up Recommendations  Supervision for mobility/OOB;Outpatient PT     Equipment Recommendations  None recommended by PT    Recommendations for Other Services       Precautions / Restrictions Precautions Precautions: Fall Precaution Comments: weakness, chest wound vac pulled 08/24/19, probable SI joint dysfunction Restrictions Weight Bearing Restrictions: Yes RUE Weight Bearing: Non weight bearing Other Position/Activity Restrictions: Gentle PROM and AROM    Mobility  Bed Mobility Overal bed mobility: (pt declining functional mobility, having already ambulated)                Transfers                    Ambulation/Gait                 Stairs             Wheelchair Mobility    Modified Rankin (Stroke Patients Only)       Balance                                            Cognition Arousal/Alertness: Awake/alert Behavior During Therapy: WFL for tasks assessed/performed Overall Cognitive Status: Within Functional Limits for tasks assessed                                        Exercises General Exercises - Upper Extremity Shoulder Flexion: PROM;Right;20 reps Shoulder ABduction: PROM;20 reps;Right Other Exercises Other Exercises: PROM shoulder external rotation- 10 reps    General Comments        Pertinent Vitals/Pain Pain Assessment: Faces Faces Pain Scale: Hurts whole lot Pain Location: R shoulder Pain Descriptors / Indicators: Aching Pain Intervention(s): Limited activity within patient's tolerance    Home Living                      Prior Function            PT Goals (current goals can now be found in the care plan section) Acute Rehab PT Goals Patient Stated Goal: go home Progress towards PT goals: Progressing toward goals    Frequency    Min  2X/week      PT Plan Current plan remains appropriate    Co-evaluation              AM-PAC PT "6 Clicks" Mobility   Outcome Measure  Help needed turning from your back to your side while in a flat bed without using bedrails?: None Help needed moving from lying on your back to sitting on the side of a flat bed without using bedrails?: None Help needed moving to and from a bed to a chair (including a wheelchair)?: None Help needed standing up from a chair using your arms (e.g., wheelchair or bedside chair)?: A Little Help needed to walk in hospital room?: A Little Help needed climbing 3-5 steps with a railing? : A Little 6 Click Score: 21    End of Session Equipment Utilized During Treatment: (none) Activity Tolerance: Patient tolerated treatment well Patient left: in bed;with call bell/phone within reach;with bed alarm set Nurse Communication: Mobility status PT Visit Diagnosis: Muscle weakness (generalized)  (M62.81);Pain;Difficulty in walking, not elsewhere classified (R26.2) Pain - Right/Left: Right Pain - part of body: Shoulder     Time: 1610-9604 PT Time Calculation (min) (ACUTE ONLY): 24 min  Charges:  $Therapeutic Exercise: 23-37 mins                     Zenaida Niece, PT, DPT Acute Rehabilitation Pager: [phone]    Zenaida Niece 09/14/2019, 3:30 PM

## 2019-09-14 NOTE — Progress Notes (Signed)
   Ortho Hand Progress Note  Subjective: No acute events last night. Pain in right shoulder controlled. Denies numbness or tingling.   Objective: Vital signs in last 24 hours: Temp:  [97.4 F (36.3 C)-98.8 F (37.1 C)] 97.7 F (36.5 C) (10/15 2030) Pulse Rate:  [69-100] 100 (10/15 2216) Resp:  [14-20] 20 (10/15 2030) BP: (123-165)/(78-103) 151/79 (10/15 2216) SpO2:  [99 %-100 %] 99 % (10/15 2030) Weight:  [80.1 kg] 80.1 kg (10/15 1538)  Intake/Output from previous day: 10/15 0701 - 10/16 0700 In: 1520 [P.O.:240; I.V.:1000; IV Piggyback:100] Out: 1275 [Urine:1225; Blood:50] Intake/Output this shift: No intake/output data recorded.  Recent Labs    09/13/19 0922  HGB 9.8*   Recent Labs    09/13/19 0922  WBC 3.7*  RBC 3.20*  HCT 29.3*  PLT 257   Recent Labs    09/12/19 1106 09/13/19 0922  NA 141 140  K 3.1* 3.3*  CL 102 105  CO2 27 25  BUN 11 16  CREATININE 0.82 0.72  GLUCOSE 101* 103*  CALCIUM 9.1 8.8*   No results for input(s): LABPT, INR in the last 72 hours.  Aaox3 nad Resp nonlabored RRR Right shoulder dressings c/d/i. Drain with serosang output. Pain with shoulder motion. Motor intact ain/pin/u/a. SILT m/u/r/a. Fingers wwp with bcr   Assessment/Plan: Right subdeltoid, subacromial septic bursitis, proximal humerus osteomyelitis with abscess in humeral shaft s/p excision of bursa, saucerization of proximal humerus and humeral shaft. DOS: 09/13/2019  - Medicine primary appreciate managemenrt - ID consulted and continue abx recs - cxs pending. Gram stains negative - Continue current dressing but OK the change/reenforce as needed. - Continue drain at this time - Sling for comfort. - NWB RUE. OK to begin gentle passive and active ROM exercises - Remainder per primary     Avanell Shackleton III 09/14/2019, 7:42 AM  [phone]

## 2019-09-15 LAB — GLUCOSE, CAPILLARY
Glucose-Capillary: 95 mg/dL (ref 70–99)
Glucose-Capillary: 168 mg/dL — ABNORMAL HIGH (ref 70–99)
Glucose-Capillary: 113 mg/dL — ABNORMAL HIGH (ref 70–99)
Glucose-Capillary: 149 mg/dL — ABNORMAL HIGH (ref 70–99)
Glucose-Capillary: 144 mg/dL — ABNORMAL HIGH (ref 70–99)
Glucose-Capillary: 183 mg/dL — ABNORMAL HIGH (ref 70–99)

## 2019-09-15 NOTE — Progress Notes (Signed)
PROGRESS NOTE  LAQUINCY EASTRIDGE QMG:[id number] DOB: Apr 22, 1981 DOA: 07/31/2019 PCP: Patient, No Pcp Per  HPI/Recap of past 24 hours: Mr. Ahlgren is a 38 y.o. M with IVDU who presented with pain in the low back, left shoulder and right foot, as well as flu-like symptoms, progressing over a few days.  In the ER, imaging showed right heel abscess, left upper chest abscess and septic emboli.  Blood cultures obtained, started on empiric antibiotics, and admitted for presumptive endocarditis and for chest wall debridement.     Interim history: 9/2 patient admitted from ER to ICU, underwent debridement of chest wall abscess in the OR, placement of wound vac. MRI lumbar spine showed paraspinal abscess (2x2x4), epidural abscesses, sacral osteomyleitis and lumbar vertebral osteomyelitis/discitis Urine, blood and wound cultures from admission all growing MSSA but thankfully repeat blood cultures 9/3, 9/6, and 9/10 all without growth Ankle abscess I&D'd on 9/4 Transitioned to nafcillin on 9/7 Right shoulder bursa aspirated on 9/11  Patient defervesced and continued on nafcillin MRI pelvis and MRI right shoulder obtained on 10/11-13 showed progression of lumbar/sacral osteo, shrinkage but persistence of paraspinal abscess and newly noted osteomyelitis with abscess of the right humeral head.  09/13/19: Patient was seen and examined at his bedside this morning.  He has no new complaints.  Denies right shoulder pain at the time of this visit.  Plan to be taken to the OR this afternoon for right humeral osteomyelitis.   09/14/19: POD# 1 post I&D R shoulder. Patient seen and examined at bedside this morning.  No acute events overnight.  This morning he has no complaints.  Denies any pain.  Early afternoon received call from bedside RN regarding patient wanting more pain medications.  Oxycodone started 10 mg as needed for moderate to severe pain every 4 hours.  09/15/19: Patient was seen and examined at  his bedside.  No acute events overnight.  No new complaints.  Pain is well-controlled on current pain management.    Assessment/Plan: Principal Problem:   Staphylococcus aureus bacteremia Active Problems:   Sepsis ([place])   Abscess of paraspinous muscles   Osteomyelitis of lumbar spine ([facility])   Abscess in epidural space of lumbar spine   Abnormal chest CT   Septic embolism ([facility])   Community acquired pneumonia   IV drug abuse ([place])   Heroin abuse ([place])   Elevated troponin   UTI (urinary tract infection)   Renal failure   Hyperglycemia   Suspected endocarditis   Chest wall abscess   Abscess of right heel   Acute osteomyelitis of lumbar spine ([facility])   Paraspinal abscess ([facility])   S/P chest tube placement ([facility])   Acute hematogenous osteomyelitis, right humerus ([facility])  Sepsis from MSSA bacteremia due to IVDA Lumbar osteomyelitis/discitis L2-4 Sacral osteomyelitis Paraspinal and epidural abscess Checked with IR on 09/11/2019, paraspinal abscess is too small to aspirate, further it is improving with IV nafcillin. -Seen by infectious disease, following -Continue nafcillin for now, defer adjustments to ID -Obtain weekly CBC/BMP, CRP ESR  -Need additional 6 weeks of antibiotics TTE done on 08/01/2019 no report of vegetation  POD #2 post I&D of newly diagnosed right humeral osteomyelitis with abscess of right proximal humerus/ Subacromial/subdeltoid septic bursitis in the setting of bacteremia MRI done on 09/11/2019 showed extensive right humeral head osteomyelitis, a humeral abscess and septic bursitis again. -Reconsult ID and Orthopedics, appreciate expert cares  Plan to be taken to the OR this afternoon Continue IV antibiotics Continue to optimize pain control  Oxycodone 10 mg every 4 hours as needed for moderate to severe pain Control type 2 diabetes with hyperglycemia  Abscess right ankle  Resolved  Chest wall abscess s/p operative I&D with wound vac application 9/2 and  7/94 Septic pulmonary emboli Resolving well  Newly diagnosed type II diabetes with hyperglycemia Glucose 700 at admission.  No previous diagnosis of diabetes.  No family history of diabetes.  Not obese.  A1c here 7.8%. Increase sliding scale to resistant  IV drug abuse -Continue telephonic psychology counseling as needed Hepatitis C screening nonreactive, HIV nonreactive on 08/01/2019  Anemia of chronic disease -Weekly CBC  Hypertension BP controlled -Continue metoprolol, will need rx at discharge  Other medications -Continue trazodone, Flexeril for insomnia, will need rx at discharge    MDM and disposition: The below labs and imaging reports reviewed and summarized above.  Medication management as above.   The patient was admitted with MSSA bacteremia, septic pulmonary emboli, large chest wall abscess, right shoulder septic arthritis, and right ankle abscess.    Unfortunately, we have found now new foci of infection that will require adjustment of antibiotics and possibly orthopedic surgery.  Defer duration of antibiotics to ID, appreciate their and Orthopedics        DVT prophylaxis: SCDs for now, start Lovenox post-op Code Status: FULL Family Communication: Mother    Consultants:   CT surgery  Orthopedics  Infectious disease  Neurosurgery  Procedures:   9/2 MRI lumbar spine  9/2 I&D left chest wall abscess with wound vac by Dr. Servando Snare  9/4 I&D right heel abscess at bedside by Hilbert Odor, [facility]  9/11 Excision wound debridemnt, wound vac change by Dr. Prescott Gum  9/14 right shoulder bursa aspiration by Silvestre Gunner, PAC  10/12 MRI pelvis   10/13 MRI RIGHT shoulder  Antimicrobials:   Nafcillin   Culture data:   9/1 urine culture -- MSSA   9/2 blood culture x2 -- MSSA in 2/2  9/2 wound culture -- MSSA  9/2 chest wall abscess intraop culture -- MSSA  9/3 blood culture x2 -- NG  9/4 heel abscess culture --  MSSA  9/6 blood culture x2 -- NG  9/10 blood culture x2 -- NG  9/14 aspirate shoulder culture -- NG     Objective: Vitals:   09/14/19 1235 09/14/19 2123 09/15/19 0828 09/15/19 1209  BP: (!) 144/85 133/79 (!) 149/102 (!) 142/78  Pulse: 85 84 80 75  Resp:  _0 Temp: 98.5 F (36.9 C) 98.8 F (37.1 C) (!) 97.3 F (36.3 C) 99 F (37.2 C)  TempSrc: Oral Oral Oral Oral  SpO2: 99% 99% 99% 100%  Weight:      Height:        Intake/Output Summary (Last 24 hours) at 09/15/2019 1420 Last data filed at 09/15/2019 0932 Gross per 24 hour  Intake 20 ml  Output 600 ml  Net -580 ml   Filed Weights   09/07/19 0445 09/09/19 0500 09/13/19 1538  Weight: 79.5 kg 80.1 kg 80.1 kg    Exam:   General: 38 y.o. year-old male well-developed well-nourished in no acute distress.  Alert and oriented x4  Cardiovascular: Regular rate and rhythm no rubs or gallops no JVD or thyromegaly.    Respiratory: Clear to auscultation no wheezes no rales.  Poor inspiratory effort.    Abdomen: Soft Nontender Nondistended Normal Bowel Sounds Present.    Musculoskeletal: Right Shoulder in Surgical Dressing.   Psychiatry: Mood is appropriate for condition and setting.  Data Reviewed: CBC: Recent Labs  Lab 09/13/19 0922  WBC 3.7*  HGB 9.8*  HCT 29.3*  MCV 91.6  PLT 235   Basic Metabolic Panel: Recent Labs  Lab 09/12/19 1106 09/13/19 0922  NA 141 140  K 3.1* 3.3*  CL 102 105  CO2 27 25  GLUCOSE 101* 103*  BUN 11 16  CREATININE 0.82 0.72  CALCIUM 9.1 8.8*   GFR: Estimated Creatinine Clearance: 141.8 mL/min (by C-G formula based on SCr of 0.72 mg/dL). Liver Function Tests: No results for input(s): AST, ALT, ALKPHOS, BILITOT, PROT, ALBUMIN in the last 168 hours. No results for input(s): LIPASE, AMYLASE in the last 168 hours. No results for input(s): AMMONIA in the last 168 hours. Coagulation Profile: No results for input(s): INR, PROTIME in the last 168 hours. Cardiac  Enzymes: No results for input(s): CKTOTAL, CKMB, CKMBINDEX, TROPONINI in the last 168 hours. BNP (last 3 results) No results for input(s): PROBNP in the last 8760 hours. HbA1C: No results for input(s): HGBA1C in the last 72 hours. CBG: Recent Labs  Lab 09/14/19 1233 09/14/19 1709 09/14/19 2104 09/15/19 0832 09/15/19 1206  GLUCAP 239* 168* 141* 113* 149*   Lipid Profile: No results for input(s): CHOL, HDL, LDLCALC, TRIG, CHOLHDL, LDLDIRECT in the last 72 hours. Thyroid Function Tests: No results for input(s): TSH, T4TOTAL, FREET4, T3FREE, THYROIDAB in the last 72 hours. Anemia Panel: No results for input(s): VITAMINB12, FOLATE, FERRITIN, TIBC, IRON, RETICCTPCT in the last 72 hours. Urine analysis:    Component Value Date/Time   COLORURINE YELLOW 08/09/2019 1845   APPEARANCEUR CLEAR 08/09/2019 1845   LABSPEC 1.029 08/09/2019 1845   PHURINE 5.0 08/09/2019 1845   GLUCOSEU 50 (A) 08/09/2019 1845   HGBUR MODERATE (A) 08/09/2019 1845   BILIRUBINUR NEGATIVE 08/09/2019 1845   KETONESUR NEGATIVE 08/09/2019 1845   PROTEINUR 30 (A) 08/09/2019 1845   NITRITE NEGATIVE 08/09/2019 1845   LEUKOCYTESUR NEGATIVE 08/09/2019 1845   Sepsis Labs: '@LABRCNTIP'$ (procalcitonin:4,lacticidven:4)  ) Recent Results (from the past 240 hour(s))  Aerobic/Anaerobic Culture (surgical/deep wound)     Status: None (Preliminary result)   Collection Time: 09/13/19  6:17 PM   Specimen: Soft Tissue, Other  Result Value Ref Range Status   Specimen Description TISSUE  Final   Special Requests RIGHT SUBACROMIAL BURSA ID A  Final   Gram Stain   Final    FEW WBC PRESENT, PREDOMINANTLY PMN NO ORGANISMS SEEN    Culture   Final    NO GROWTH 2 DAYS NO ANAEROBES ISOLATED; CULTURE IN PROGRESS FOR 5 DAYS Performed at [facility] [address] [place], [place] [address]    Report Status PENDING  Incomplete  Aerobic/Anaerobic Culture (surgical/deep wound)     Status: None (Preliminary result)   Collection  Time: 09/13/19  6:34 PM   Specimen: Soft Tissue, Other  Result Value Ref Range Status   Specimen Description ABSCESS  Final   Special Requests RIGHT HUMERAL INTRAMEDULLARY CANAL ID B  Final   Gram Stain   Final    RARE WBC PRESENT, PREDOMINANTLY PMN NO ORGANISMS SEEN    Culture   Final    NO GROWTH 2 DAYS NO ANAEROBES ISOLATED; CULTURE IN PROGRESS FOR 5 DAYS Performed at [facility], [place] [address], [place], [place] [address]    Report Status PENDING  Incomplete  Aerobic/Anaerobic Culture (surgical/deep wound)     Status: None (Preliminary result)   Collection Time: 09/13/19  6:47 PM   Specimen: Bone; Tissue  Result Value Ref Range Status   Specimen Description BONE  Final   Special Requests HUMERAL HEAD ID C  Final   Gram Stain   Final    RARE WBC PRESENT, PREDOMINANTLY PMN NO ORGANISMS SEEN    Culture   Final    NO GROWTH 2 DAYS NO ANAEROBES ISOLATED; CULTURE IN PROGRESS FOR 5 DAYS Performed at [facility] [address] [place],  [address]    Report Status PENDING  Incomplete      Studies: No results found.  Scheduled Meds:  feeding supplement (ENSURE ENLIVE)  237 mL Oral BID BM   insulin aspart  0-20 Units Subcutaneous TID WC   insulin aspart  0-5 Units Subcutaneous QHS   magnesium oxide  400 mg Oral BID   Melatonin  6 mg Oral Once   metoprolol tartrate  12.5 mg Oral BID   ondansetron  4 mg Intravenous Once   oxyCODONE  2.5 mg Oral QHS   sodium chloride flush  10-40 mL Intracatheter Q12H    Continuous Infusions:  sodium chloride 1,000 mL (08/24/19 2247)    ceFAZolin (ANCEF) IV 2 g (09/15/19 0535)   lactated ringers 10 mL/hr at 08/10/19 0845     LOS: 68 days     Kayleen Memos, MD [facility] Pager [phone]  If 7PM-7AM, please contact night-coverage [email or web address] Password [email or web address] 09/15/2019, 2:20 PM

## 2019-09-16 ENCOUNTER — Inpatient Hospital Stay (HOSPITAL_COMMUNITY): Payer: BC Managed Care – PPO

## 2019-09-16 DIAGNOSIS — R7881 Bacteremia: Secondary | ICD-10-CM | POA: Diagnosis not present

## 2019-09-16 DIAGNOSIS — B9561 Methicillin susceptible Staphylococcus aureus infection as the cause of diseases classified elsewhere: Secondary | ICD-10-CM | POA: Diagnosis not present

## 2019-09-16 DIAGNOSIS — M5126 Other intervertebral disc displacement, lumbar region: Secondary | ICD-10-CM | POA: Diagnosis not present

## 2019-09-16 DIAGNOSIS — G061 Intraspinal abscess and granuloma: Secondary | ICD-10-CM | POA: Diagnosis not present

## 2019-09-16 LAB — GLUCOSE, CAPILLARY
Glucose-Capillary: 115 mg/dL — ABNORMAL HIGH (ref 70–99)
Glucose-Capillary: 231 mg/dL — ABNORMAL HIGH (ref 70–99)
Glucose-Capillary: 138 mg/dL — ABNORMAL HIGH (ref 70–99)
Glucose-Capillary: 201 mg/dL — ABNORMAL HIGH (ref 70–99)

## 2019-09-16 MED ORDER — POLYETHYLENE GLYCOL 3350 17 G PO PACK
17.0000 g | PACK | Freq: Every day | ORAL | Status: DC
  Filled 2019-09-16 (×3): qty 1

## 2019-09-16 MED ORDER — GADOBUTROL 1 MMOL/ML IV SOLN
7.0000 mL | Freq: Once | INTRAVENOUS | Status: AC | PRN
  Administered 2019-09-16: 7 mL via INTRAVENOUS

## 2019-09-16 MED ORDER — SENNOSIDES-DOCUSATE SODIUM 8.6-50 MG PO TABS
2.0000 | ORAL_TABLET | Freq: Two times a day (BID) | ORAL | Status: DC
  Administered 2019-09-16 – 2019-09-18 (×5): 2 via ORAL
  Filled 2019-09-16 (×9): qty 2

## 2019-09-16 NOTE — Progress Notes (Signed)
 PROGRESS NOTE  William [name] MRN:[id number] DOB: 10/28/1981 DOA: 07/31/2019 PCP: Patient, No Pcp Per  HPI/Recap of past 24 hours: William [name] is a 38 y.o. M with IVDU who presented with pain in the low back, left shoulder and right foot, as well as flu-like symptoms, progressing over a few days.  In the ER, imaging showed right heel abscess, left upper chest abscess and septic emboli.  Blood cultures obtained, started on empiric antibiotics, and admitted for presumptive endocarditis and for chest wall debridement.     Interim history: 9/2 patient admitted from ER to ICU, underwent debridement of chest wall abscess in the OR, placement of wound vac. MRI lumbar spine showed paraspinal abscess (2x2x4), epidural abscesses, sacral osteomyleitis and lumbar vertebral osteomyelitis/discitis Urine, blood and wound cultures from admission all growing MSSA but thankfully repeat blood cultures 9/3, 9/6, and 9/10 all without growth Ankle abscess I&D'd on 9/4 Transitioned to nafcillin on 9/7 Right shoulder bursa aspirated on 9/11  Patient defervesced and continued on nafcillin MRI pelvis and MRI right shoulder obtained on 10/11-13 showed progression of lumbar/sacral osteo, shrinkage but persistence of paraspinal abscess and newly noted osteomyelitis with abscess of the right humeral head.  Post I&D R shoulder on 09/13/19.  09/16/19: Patient was seen and examined his bedside.  No new complaints except for constipation reported by bedside RN.  Started on bowel regimen 2 tablets Senokot twice daily and MiraLAX daily.  He is alert and oriented x4.   Assessment/Plan: Principal Problem:   Staphylococcus aureus bacteremia Active Problems:   Sepsis (HCC)   Abscess of paraspinous muscles   Osteomyelitis of lumbar spine (HCC)   Abscess in epidural space of lumbar spine   Abnormal chest CT   Septic embolism (HCC)   Community acquired pneumonia   IV drug abuse (HCC)   Heroin abuse (HCC)  Elevated troponin   UTI (urinary tract infection)   Renal failure   Hyperglycemia   Suspected endocarditis   Chest wall abscess   Abscess of right heel   Acute osteomyelitis of lumbar spine (HCC)   Paraspinal abscess (HCC)   S/P chest tube placement (HCC)   Acute hematogenous osteomyelitis, right humerus (HCC)  Sepsis from MSSA bacteremia due to IVDA Lumbar osteomyelitis/discitis L2-4 Sacral osteomyelitis Paraspinal and epidural abscess Checked with IR on 09/11/2019, paraspinal abscess is too small to aspirate, further it is improving with IV nafcillin. -Seen by infectious disease, following -Continue nafcillin for now, defer adjustments to ID -Obtain weekly CBC/BMP, CRP ESR  -Need additional 6 weeks of antibiotics TTE done on 08/01/2019 no report of vegetation  POD #2 post I&D of newly diagnosed right humeral osteomyelitis with abscess of right proximal humerus/ Subacromial/subdeltoid septic bursitis in the setting of bacteremia MRI done on 09/11/2019 showed extensive right humeral head osteomyelitis, a humeral abscess and septic bursitis again. -Reconsult ID and Orthopedics, appreciate expert cares  Plan to be taken to the OR this afternoon Continue IV antibiotics Continue to optimize pain control Oxycodone 10 mg every 4 hours as needed for moderate to severe pain Control type 2 diabetes with hyperglycemia  Opiate-induced constipation Started Senokot 2 tablets twice daily and MiraLAX daily Encourage oral intake and avoid dehydration  Abscess right ankle  Resolved  Chest wall abscess s/p operative I&D with wound vac application 9/2 and 9/11 Septic pulmonary emboli Resolving well  Newly diagnosed type II diabetes with hyperglycemia Glucose 700 at admission.  No previous diagnosis of diabetes.  No family history of diabetes.  Not   obese.  A1c here 7.8%. Increase sliding scale to resistant  IV drug abuse -Continue telephonic psychology counseling as needed Hepatitis C  screening nonreactive, HIV nonreactive on 08/01/2019  Anemia of chronic disease -Weekly CBC  Hypertension BP controlled -Continue metoprolol, will need rx at discharge  Other medications -Continue trazodone, Flexeril for insomnia, will need rx at discharge    MDM and disposition: The below labs and imaging reports reviewed and summarized above.  Medication management as above.   The patient was admitted with MSSA bacteremia, septic pulmonary emboli, large chest wall abscess, right shoulder septic arthritis, and right ankle abscess.    Unfortunately, we have found now new foci of infection that will require adjustment of antibiotics and possibly orthopedic surgery.  Defer duration of antibiotics to ID, appreciate their and Orthopedics        DVT prophylaxis: SCDs for now, start Lovenox post-op Code Status: FULL Family Communication: Mother    Consultants:   CT surgery  Orthopedics  Infectious disease  Neurosurgery  Procedures:   9/2 MRI lumbar spine  9/2 I&D left chest wall abscess with wound vac by Dr. Gerhardt  9/4 I&D right heel abscess at bedside by Michael Jeffrey, PAC  9/11 Excision wound debridemnt, wound vac change by Dr. Van Trigt  9/14 right shoulder bursa aspiration by Michael Jeffery, PAC  10/12 MRI pelvis   10/13 MRI RIGHT shoulder  Antimicrobials:   Nafcillin   Culture data:   9/1 urine culture -- MSSA   9/2 blood culture x2 -- MSSA in 2/2  9/2 wound culture -- MSSA  9/2 chest wall abscess intraop culture -- MSSA  9/3 blood culture x2 -- NG  9/4 heel abscess culture -- MSSA  9/6 blood culture x2 -- NG  9/10 blood culture x2 -- NG  9/14 aspirate shoulder culture -- NG     Objective: Vitals:   09/15/19 1551 09/15/19 2017 09/16/19 0500 09/16/19 0927  BP: 128/84 140/88  (!) 152/97  Pulse: 86 82  84  Resp: 17 18  18  Temp: (!) 97.5 F (36.4 C) 99.7 F (37.6 C)  98.4 F (36.9 C)  TempSrc: Oral  Oral  Oral  SpO2: 100% 100%  100%  Weight:   76.4 kg   Height:        Intake/Output Summary (Last 24 hours) at 09/16/2019 1049 Last data filed at 09/16/2019 0846 Gross per 24 hour  Intake 400.07 ml  Output 800 ml  Net -399.93 ml   Filed Weights   09/09/19 0500 09/13/19 1538 09/16/19 0500  Weight: 80.1 kg 80.1 kg 76.4 kg    Exam:  . General: 37 y.o. year-old male well-developed well-nourished no acute distress.  Alert and oriented x4.   . Cardiovascular: Regular rate and rhythm no rubs or gallops no JVD or thyromegaly noted.   . Respiratory: Clear to auscultation no wheezes or rales.  Poor inspiratory effort.   . Abdomen: Soft nontender nondistended normal bowel sounds present.   . Musculoskeletal: Right shoulder in surgical dressing. Psychiatry: Mood is appropriate for condition and setting.  Data Reviewed: CBC: Recent Labs  Lab 09/13/19 0922  WBC 3.7*  HGB 9.8*  HCT 29.3*  MCV 91.6  PLT 257   Basic Metabolic Panel: Recent Labs  Lab 09/12/19 1106 09/13/19 0922  NA 141 140  K 3.1* 3.3*  CL 102 105  CO2 27 25  GLUCOSE 101* 103*  BUN 11 16  CREATININE 0.82 0.72  CALCIUM 9.1 8.8*   GFR: Estimated   Creatinine Clearance: 135.3 mL/min (by C-G formula based on SCr of 0.72 mg/dL). Liver Function Tests: No results for input(s): AST, ALT, ALKPHOS, BILITOT, PROT, ALBUMIN in the last 168 hours. No results for input(s): LIPASE, AMYLASE in the last 168 hours. No results for input(s): AMMONIA in the last 168 hours. Coagulation Profile: No results for input(s): INR, PROTIME in the last 168 hours. Cardiac Enzymes: No results for input(s): CKTOTAL, CKMB, CKMBINDEX, TROPONINI in the last 168 hours. BNP (last 3 results) No results for input(s): PROBNP in the last 8760 hours. HbA1C: No results for input(s): HGBA1C in the last 72 hours. CBG: Recent Labs  Lab 09/15/19 0832 09/15/19 1206 09/15/19 1722 09/15/19 2211 09/16/19 0609  GLUCAP 113* 149* 144* 183* 115*    Lipid Profile: No results for input(s): CHOL, HDL, LDLCALC, TRIG, CHOLHDL, LDLDIRECT in the last 72 hours. Thyroid Function Tests: No results for input(s): TSH, T4TOTAL, FREET4, T3FREE, THYROIDAB in the last 72 hours. Anemia Panel: No results for input(s): VITAMINB12, FOLATE, FERRITIN, TIBC, IRON, RETICCTPCT in the last 72 hours. Urine analysis:    Component Value Date/Time   COLORURINE YELLOW 08/09/2019 1845   APPEARANCEUR CLEAR 08/09/2019 1845   LABSPEC 1.029 08/09/2019 1845   PHURINE 5.0 08/09/2019 1845   GLUCOSEU 50 (A) 08/09/2019 1845   HGBUR MODERATE (A) 08/09/2019 1845   BILIRUBINUR NEGATIVE 08/09/2019 1845   KETONESUR NEGATIVE 08/09/2019 1845   PROTEINUR 30 (A) 08/09/2019 1845   NITRITE NEGATIVE 08/09/2019 1845   LEUKOCYTESUR NEGATIVE 08/09/2019 1845   Sepsis Labs: @LABRCNTIP(procalcitonin:4,lacticidven:4)  ) Recent Results (from the past 240 hour(s))  Aerobic/Anaerobic Culture (surgical/deep wound)     Status: None (Preliminary result)   Collection Time: 09/13/19  6:17 PM   Specimen: Soft Tissue, Other  Result Value Ref Range Status   Specimen Description TISSUE  Final   Special Requests RIGHT SUBACROMIAL BURSA ID A  Final   Gram Stain   Final    FEW WBC PRESENT, PREDOMINANTLY PMN NO ORGANISMS SEEN    Culture   Final    NO GROWTH 2 DAYS NO ANAEROBES ISOLATED; CULTURE IN PROGRESS FOR 5 DAYS Performed at [facility], [address] [place], [place] [address]    Report Status PENDING  Incomplete  Aerobic/Anaerobic Culture (surgical/deep wound)     Status: None (Preliminary result)   Collection Time: 09/13/19  6:34 PM   Specimen: Soft Tissue, Other  Result Value Ref Range Status   Specimen Description ABSCESS  Final   Special Requests RIGHT HUMERAL INTRAMEDULLARY CANAL ID B  Final   Gram Stain   Final    RARE WBC PRESENT, PREDOMINANTLY PMN NO ORGANISMS SEEN    Culture   Final    NO GROWTH 2 DAYS NO ANAEROBES ISOLATED; CULTURE IN PROGRESS FOR 5 DAYS  Performed at [facility], [address] [place], [place] [address]    Report Status PENDING  Incomplete  Aerobic/Anaerobic Culture (surgical/deep wound)     Status: None (Preliminary result)   Collection Time: 09/13/19  6:47 PM   Specimen: Bone; Tissue  Result Value Ref Range Status   Specimen Description BONE  Final   Special Requests HUMERAL HEAD ID C  Final   Gram Stain   Final    RARE WBC PRESENT, PREDOMINANTLY PMN NO ORGANISMS SEEN    Culture   Final    NO GROWTH 2 DAYS NO ANAEROBES ISOLATED; CULTURE IN PROGRESS FOR 5 DAYS Performed at [facility], [address]   [place], [place] [address]    Report Status PENDING  Incomplete      Studies: No results found.  Scheduled Meds: . feeding supplement (ENSURE ENLIVE)  237 mL Oral BID BM  . insulin aspart  0-20 Units Subcutaneous TID WC  . insulin aspart  0-5 Units Subcutaneous QHS  . magnesium oxide  400 mg Oral BID  . Melatonin  6 mg Oral Once  . metoprolol tartrate  12.5 mg Oral BID  . ondansetron  4 mg Intravenous Once  . oxyCODONE  2.5 mg Oral QHS  . polyethylene glycol  17 g Oral Daily  . senna-docusate  2 tablet Oral BID  . sodium chloride flush  10-40 mL Intracatheter Q12H    Continuous Infusions: . sodium chloride 1,000 mL (08/24/19 2247)  .  ceFAZolin (ANCEF) IV 2 g (09/16/19 0719)  . lactated ringers 10 mL/hr at 08/10/19 0845     LOS: 43 days     Kayleen Memos, MD [facility] Pager [phone]  If 7PM-7AM, please contact night-coverage [email or web address] Password [email or web address] 09/16/2019, 10:49 AM

## 2019-09-17 LAB — GLUCOSE, CAPILLARY
Glucose-Capillary: 145 mg/dL — ABNORMAL HIGH (ref 70–99)
Glucose-Capillary: 178 mg/dL — ABNORMAL HIGH (ref 70–99)
Glucose-Capillary: 160 mg/dL — ABNORMAL HIGH (ref 70–99)
Glucose-Capillary: 94 mg/dL (ref 70–99)

## 2019-09-17 NOTE — Progress Notes (Signed)
  NEUROSURGERY PROGRESS NOTE   MRI L spine frm 09/16/2019 was reviewed and compared to prior MRI on 08/01/2019 and much improved. Previously seen epidural abscess is completely resolved. There is now right L4-5 septic arthritis and progressive L5-S1 discitis/osteromyelitis. No kyphosis/intervertebral body collapse. Remains no plan for NS intervention. Continue abx per ID. Please call for any concerns.  Ferne Reus, PA-C [facility] [employer]

## 2019-09-17 NOTE — Progress Notes (Signed)
   Ortho Hand Progress Note  Subjective: No acute events. Pain in right shoulder controlled. Drain pulled over the weekend.   Objective: Vital signs in last 24 hours: Temp:  [98.4 F (36.9 C)-99.3 F (37.4 C)] 99.3 F (37.4 C) (10/19 0749) Pulse Rate:  [84-96] 85 (10/19 0749) Resp:  [15-18] 16 (10/19 0749) BP: (120-152)/(86-97) 149/97 (10/19 0749) SpO2:  [99 %-100 %] 99 % (10/19 0749) Weight:  [78.2 kg] 78.2 kg (10/19 0500)  Intake/Output from previous day: 10/18 0701 - 10/19 0700 In: -  Out: 400 [Urine:400] Intake/Output this shift: No intake/output data recorded.  No results for input(s): HGB in the last 72 hours. No results for input(s): WBC, RBC, HCT, PLT in the last 72 hours. No results for input(s): NA, K, CL, CO2, BUN, CREATININE, GLUCOSE, CALCIUM in the last 72 hours. No results for input(s): LABPT, INR in the last 72 hours.  Aaox3 nad Resp nonlabored RRR RUE: incision c/d/i. Mild swelling to shoulder. No erythema. Minimal ttp. Minimal shoulder ROM. Motor intact ain/pin/u/a. SILT m/u/r/a. Fingers wwp with bcr   Assessment/Plan: Right subdeltoid, subacromial septic bursitis, proximal humerus osteomyelitis with abscess in humeral shaft s/p excision of bursa, saucerization of proximal humerus and humeral shaft. DOS: 09/13/2019  - Medicine primary appreciate managemenrt - ID consulted and continue abx recs - cxs ngtd. - Draily dressing changes per nursing - Sling as needed for comfort. - NWB RUE. PT for passive and active ROM exercises - Remainder per primary    William [name] 09/17/2019, 9:23 AM  [phone]

## 2019-09-17 NOTE — Progress Notes (Signed)
PROGRESS NOTE  William [name] UKG:[id number] DOB: 10/17/1981 DOA: 07/31/2019 PCP: Patient, No Pcp Per  HPI/Recap of past 24 hours: Mr. William [name] is a 38 y.o. M with IVDU who presented with pain in the low back, left shoulder and right foot, as well as flu-like symptoms, progressing over a few days.  In the ER, imaging showed right heel abscess, left upper chest abscess and septic emboli.  Blood cultures obtained, started on empiric antibiotics, and admitted for presumptive endocarditis and for chest wall debridement.     Interim history: 9/2 patient admitted from ER to ICU, underwent debridement of chest wall abscess in the OR, placement of wound vac. MRI lumbar spine showed paraspinal abscess (2x2x4), epidural abscesses, sacral osteomyleitis and lumbar vertebral osteomyelitis/discitis Urine, blood and wound cultures from admission all growing MSSA but thankfully repeat blood cultures 9/3, 9/6, and 9/10 all without growth Ankle abscess I&D'd on 9/4 Transitioned to nafcillin on 9/7 Right shoulder bursa aspirated on 9/11  Patient defervesced and continued on nafcillin MRI pelvis and MRI right shoulder obtained on 10/11-13 showed progression of lumbar/sacral osteo, shrinkage but persistence of paraspinal abscess and newly noted osteomyelitis with abscess of the right humeral head.  Post I&D R shoulder on 09/13/19.  09/17/19: seen and examined at bedside. No new events. No new complaints. Denies new back pain or new symptoms.   Assessment/Plan: Principal Problem:   Staphylococcus aureus bacteremia Active Problems:   Sepsis ([place])   Abscess of paraspinous muscles   Osteomyelitis of lumbar spine (HCC)   Abscess in epidural space of lumbar spine   Abnormal chest CT   Septic embolism (HCC)   Community acquired pneumonia   IV drug abuse ([place])   Heroin abuse ([place])   Elevated troponin   UTI (urinary tract infection)   Renal failure   Hyperglycemia   Suspected endocarditis   Chest  wall abscess   Abscess of right heel   Acute osteomyelitis of lumbar spine (HCC)   Paraspinal abscess (HCC)   S/P chest tube placement (HCC)   Acute hematogenous osteomyelitis, right humerus (HCC)  Sepsis from MSSA bacteremia due to IVDA Lumbar osteomyelitis/discitis L2-4 Sacral osteomyelitis Paraspinal and epidural abscess New L4-L5 septic arthritis and progressive L5-S1 discitis/osteomyelitis Checked with IR on 09/11/2019, paraspinal abscess is too small to aspirate, further it is improving with IV nafcillin. -Seen by infectious disease, following -Continue nafcillin for now, defer adjustments to ID -Obtain weekly CBC/BMP, CRP ESR  -Need additional 6 weeks of antibiotics TTE done on 08/01/2019 no report of vegetation Continue IV abx as rec by ID  POD #4 post I&D of newly diagnosed right humeral osteomyelitis with abscess of right proximal humerus/ Subacromial/subdeltoid septic bursitis in the setting of bacteremia MRI done on 09/11/2019 showed extensive right humeral head osteomyelitis, a humeral abscess and septic bursitis again. -Reconsult ID and Orthopedics, appreciate expert cares  Plan to be taken to the OR this afternoon Continue IV antibiotics Continue to optimize pain control Oxycodone 10 mg every 4 hours as needed for moderate to severe pain Control type 2 diabetes with hyperglycemia  Opiate-induced constipation Started Senokot 2 tablets twice daily and MiraLAX daily Encourage oral intake and avoid dehydration  Abscess right ankle  Resolved  Chest wall abscess s/p operative I&D with wound vac application 9/2 and 7/62 Septic pulmonary emboli Resolving well  Newly diagnosed type II diabetes with hyperglycemia Glucose 700 at admission.  No previous diagnosis of diabetes.  No family history of diabetes.  Not obese.  A1c  here 7.8%. Increase sliding scale to resistant  IV drug abuse -Continue telephonic psychology counseling as needed Hepatitis C screening  nonreactive, HIV nonreactive on 08/01/2019  Anemia of chronic disease -Weekly CBC  Hypertension BP controlled -Continue metoprolol, will need rx at discharge  Other medications -Continue trazodone, Flexeril for insomnia, will need rx at discharge    MDM and disposition: The below labs and imaging reports reviewed and summarized above.  Medication management as above.   The patient was admitted with MSSA bacteremia, septic pulmonary emboli, large chest wall abscess, right shoulder septic arthritis, and right ankle abscess.    Unfortunately, we have found now new foci of infection that will require adjustment of antibiotics and possibly orthopedic surgery.  Defer duration of antibiotics to ID, appreciate their and Orthopedics        DVT prophylaxis: SCDs for now, start Lovenox post-op Code Status: FULL Family Communication: Mother    Consultants:   CT surgery  Orthopedics  Infectious disease  Neurosurgery  Procedures:   9/2 MRI lumbar spine  9/2 I&D left chest wall abscess with wound vac by Dr. Servando Snare  9/4 I&D right heel abscess at bedside by Hilbert Odor, [facility]  9/11 Excision wound debridemnt, wound vac change by Dr. Prescott Gum  9/14 right shoulder bursa aspiration by Silvestre Gunner, PAC  10/12 MRI pelvis   10/13 MRI RIGHT shoulder  Antimicrobials:   Nafcillin   Culture data:   9/1 urine culture -- MSSA   9/2 blood culture x2 -- MSSA in 2/2  9/2 wound culture -- MSSA  9/2 chest wall abscess intraop culture -- MSSA  9/3 blood culture x2 -- NG  9/4 heel abscess culture -- MSSA  9/6 blood culture x2 -- NG  9/10 blood culture x2 -- NG  9/14 aspirate shoulder culture -- NG     Objective: Vitals:   09/16/19 2253 09/17/19 0500 09/17/19 0749 09/17/19 1225  BP: 137/86  (!) 149/97 130/77  Pulse: 84  85 (!) 106  Resp: '15  16 18  ' Temp: 98.9 F (37.2 C)  99.3 F (37.4 C) 98 F (36.7 C)  TempSrc: Oral  Oral Oral   SpO2: 99%  99% 97%  Weight:  78.2 kg    Height:       No intake or output data in the 24 hours ending 09/17/19 1339 Filed Weights   09/13/19 1538 09/16/19 0500 09/17/19 0500  Weight: 80.1 kg 76.4 kg 78.2 kg    Exam:  . General: 38 y.o. year-old male WD WN NAD A&O x 4 . Cardiovascular: RRR no rubs or gallops. No JVD  . Respiratory:CTA no rales or wheezes . Abdomen: soft NT ND NBS  . Musculoskeletal: R shoulder in surgical dressing Psychiatry: Mood is appropriate for condition and setting.  Data Reviewed: CBC: Recent Labs  Lab 09/13/19 0922  WBC 3.7*  HGB 9.8*  HCT 29.3*  MCV 91.6  PLT 016   Basic Metabolic Panel: Recent Labs  Lab 09/12/19 1106 09/13/19 0922  NA 141 140  K 3.1* 3.3*  CL 102 105  CO2 27 25  GLUCOSE 101* 103*  BUN 11 16  CREATININE 0.82 0.72  CALCIUM 9.1 8.8*   GFR: Estimated Creatinine Clearance: 138.5 mL/min (by C-G formula based on SCr of 0.72 mg/dL). Liver Function Tests: No results for input(s): AST, ALT, ALKPHOS, BILITOT, PROT, ALBUMIN in the last 168 hours. No results for input(s): LIPASE, AMYLASE in the last 168 hours. No results for input(s): AMMONIA in the last  168 hours. Coagulation Profile: No results for input(s): INR, PROTIME in the last 168 hours. Cardiac Enzymes: No results for input(s): CKTOTAL, CKMB, CKMBINDEX, TROPONINI in the last 168 hours. BNP (last 3 results) No results for input(s): PROBNP in the last 8760 hours. HbA1C: No results for input(s): HGBA1C in the last 72 hours. CBG: Recent Labs  Lab 09/16/19 1221 09/16/19 1604 09/16/19 2124 09/17/19 0627 09/17/19 1147  GLUCAP 231* 138* 201* 145* 178*   Lipid Profile: No results for input(s): CHOL, HDL, LDLCALC, TRIG, CHOLHDL, LDLDIRECT in the last 72 hours. Thyroid Function Tests: No results for input(s): TSH, T4TOTAL, FREET4, T3FREE, THYROIDAB in the last 72 hours. Anemia Panel: No results for input(s): VITAMINB12, FOLATE, FERRITIN, TIBC, IRON, RETICCTPCT in  the last 72 hours. Urine analysis:    Component Value Date/Time   COLORURINE YELLOW 08/09/2019 1845   APPEARANCEUR CLEAR 08/09/2019 1845   LABSPEC 1.029 08/09/2019 1845   PHURINE 5.0 08/09/2019 1845   GLUCOSEU 50 (A) 08/09/2019 1845   HGBUR MODERATE (A) 08/09/2019 1845   BILIRUBINUR NEGATIVE 08/09/2019 1845   KETONESUR NEGATIVE 08/09/2019 1845   PROTEINUR 30 (A) 08/09/2019 1845   NITRITE NEGATIVE 08/09/2019 1845   LEUKOCYTESUR NEGATIVE 08/09/2019 1845   Sepsis Labs: '@LABRCNTIP' (procalcitonin:4,lacticidven:4)  ) Recent Results (from the past 240 hour(s))  Aerobic/Anaerobic Culture (surgical/deep wound)     Status: None (Preliminary result)   Collection Time: 09/13/19  6:17 PM   Specimen: Soft Tissue, Other  Result Value Ref Range Status   Specimen Description TISSUE  Final   Special Requests RIGHT SUBACROMIAL BURSA ID A  Final   Gram Stain   Final    FEW WBC PRESENT, PREDOMINANTLY PMN NO ORGANISMS SEEN    Culture   Final    NO GROWTH 4 DAYS NO ANAEROBES ISOLATED; CULTURE IN PROGRESS FOR 5 DAYS Performed at [facility] [address] [place], [place] [address]    Report Status PENDING  Incomplete  Aerobic/Anaerobic Culture (surgical/deep wound)     Status: None (Preliminary result)   Collection Time: 09/13/19  6:34 PM   Specimen: Soft Tissue, Other  Result Value Ref Range Status   Specimen Description ABSCESS  Final   Special Requests RIGHT HUMERAL INTRAMEDULLARY CANAL ID B  Final   Gram Stain   Final    RARE WBC PRESENT, PREDOMINANTLY PMN NO ORGANISMS SEEN    Culture   Final    NO GROWTH 4 DAYS NO ANAEROBES ISOLATED; CULTURE IN PROGRESS FOR 5 DAYS Performed at [facility], [place] [address], [place], [place] [address]    Report Status PENDING  Incomplete  Aerobic/Anaerobic Culture (surgical/deep wound)     Status: None (Preliminary result)   Collection Time: 09/13/19  6:47 PM   Specimen: Bone; Tissue  Result Value Ref Range Status   Specimen  Description BONE  Final   Special Requests HUMERAL HEAD ID C  Final   Gram Stain   Final    RARE WBC PRESENT, PREDOMINANTLY PMN NO ORGANISMS SEEN    Culture   Final    NO GROWTH 4 DAYS NO ANAEROBES ISOLATED; CULTURE IN PROGRESS FOR 5 DAYS Performed at [facility], [place] [address] [place], [place] [address]    Report Status PENDING  Incomplete      Studies: Mr Lumbar Spine W Wo Contrast  Result Date: 09/16/2019 CLINICAL DATA:  Lumbar epidural abscess EXAM: MRI LUMBAR SPINE WITHOUT AND WITH CONTRAST TECHNIQUE: Multiplanar and multiecho pulse sequences of the lumbar  spine were obtained without and with intravenous contrast. CONTRAST:  62m GADAVIST GADOBUTROL 1 MMOL/ML IV SOLN COMPARISON:  MRI 08/01/2019 FINDINGS: Segmentation:  Standard. Alignment:  Physiologic. Vertebrae: Marrow edema and enhancement with low T1 signal changes are again seen within the L5 and S1 vertebral bodies compatible with known osteomyelitis. There is decreased fluid signal within the L5-S1 disc compared to prior. Slight interval disc height loss compared to prior. The remaining osseous structures are within normal limits. Conus medullaris and cauda equina: Conus extends to the L1 level. Conus and cauda equina appear normal. Paraspinal and other soft tissues: Right paraspinal intramuscular edema with small residual peripherally enhancing fluid collection posterior to the right L4-5 facet joint measures 0.7 x 0.6 x 3.2 cm (series 16, image 25), previously 2.3 x 2.2 x 4.5 cm. Collection communicates with the right L4-5 facet joint with associated marrow edema and enhancement of the superior and articular process (series 19, image 27; series 18, image 4). No psoas muscle edema or fluid. Disc levels: The previously seen epidural abscess has resolved. No residual epidural fluid is evident. L1-L2: No significant disc protrusion, foraminal stenosis, or canal stenosis. L2-L3: No significant disc protrusion, foraminal stenosis,  or canal stenosis. L3-L4: No significant disc protrusion, foraminal stenosis, or canal stenosis. L4-L5: Mild diffuse disc bulge and endplate spurring resulting in mild bilateral foraminal stenosis. There is soft tissue enhancement within the right neural foramen, less pronounced compared to prior. There is no canal stenosis. L5-S1: Abnormal enhancement within the disc with progressive disc height loss compared to prior. There is moderate bilateral foraminal stenosis. No canal stenosis. IMPRESSION: 1. Septic arthritis with osteomyelitis centered at the right L4-5 facet joint, progressed from prior. The associated communicating soft tissue abscess in the right paraspinal musculature has significantly decreased in size compared to prior. 2. Soft tissue enhancement within the right L4-5 neural foramina, likely reactive secondary to inflammatory process within the adjacent facet joint. 3. L5-S1 discitis-osteomyelitis with progressive disc height loss compared to prior. There is less abnormal fluid signal within the L5-S1 disc. No interval vertebral body collapse. 4. Complete resolution of the previously seen epidural abscess. These results will be called to the ordering clinician or representative by the Radiologist Assistant, and communication documented in the PACS or zVision Dashboard. Electronically Signed   By: NDavina PokeM.D.   On: 09/16/2019 15:22    Scheduled Meds: . feeding supplement (ENSURE ENLIVE)  237 mL Oral BID BM  . insulin aspart  0-20 Units Subcutaneous TID WC  . insulin aspart  0-5 Units Subcutaneous QHS  . magnesium oxide  400 mg Oral BID  . Melatonin  6 mg Oral Once  . metoprolol tartrate  12.5 mg Oral BID  . ondansetron  4 mg Intravenous Once  . oxyCODONE  2.5 mg Oral QHS  . polyethylene glycol  17 g Oral Daily  . senna-docusate  2 tablet Oral BID  . sodium chloride flush  10-40 mL Intracatheter Q12H    Continuous Infusions: . sodium chloride 1,000 mL (08/24/19 2247)  .   ceFAZolin (ANCEF) IV 2 g (09/17/19 0556)  . lactated ringers 10 mL/hr at 08/10/19 0845     LOS: 439days     CKayleen Memos MD [facility] Pager [phone] If 7PM-7AM, please contact night-coverage [email or web address] Password [email or web address] 09/17/2019, 1:39 PM

## 2019-09-17 NOTE — Consult Note (Addendum)
Subjective: Pt seen at the bedside on rounds this morning on rounds. The patient was sitting up in the chair and playing music off his computer. He says his R arm pain feels controlled but his ROM is limited. We told him this is to be expected as he recovers from surgery. No other complaints at this time.   Antibiotics:  Anti-infectives (From admission, onward)   Start     Dose/Rate Route Frequency Ordered Stop   09/13/19 1813  gentamicin (GARAMYCIN) injection  Status:  Discontinued       As needed 09/13/19 1814 09/13/19 1923   09/13/19 1811  vancomycin (VANCOCIN) powder  Status:  Discontinued       As needed 09/13/19 1812 09/13/19 1923   09/13/19 1732  polymyxin B 500,000 Units, bacitracin 50,000 Units in sodium chloride 0.9 % 500 mL irrigation  Status:  Discontinued       As needed 09/13/19 1733 09/13/19 1923   09/13/19 0945  ceFAZolin (ANCEF) IVPB 2g/100 mL premix     2 g 200 mL/hr over 30 Minutes Intravenous On call to O.R. 09/13/19 0938 09/13/19 1800   09/12/19 1500  ceFAZolin (ANCEF) IVPB 2g/100 mL premix     2 g 200 mL/hr over 30 Minutes Intravenous Every 8 hours 09/12/19 1416     08/10/19 0936  vancomycin (VANCOCIN) 1,000 mg in sodium chloride 0.9 % 1,000 mL irrigation  Status:  Discontinued       As needed 08/10/19 0937 08/10/19 1121   08/10/19 0600  ceFAZolin (ANCEF) IVPB 2g/100 mL premix     2 g 200 mL/hr over 30 Minutes Intravenous To Short Stay 08/10/19 0036 08/10/19 0625   08/09/19 1730  ceFAZolin (ANCEF) IVPB 2g/100 mL premix  Status:  Discontinued     2 g 200 mL/hr over 30 Minutes Intravenous 30 min pre-op 08/09/19 1730 08/10/19 0036   08/06/19 1430  nafcillin injection 2 g  Status:  Discontinued     2 g Intravenous Every 4 hours 08/06/19 1310 08/06/19 1359   08/06/19 1430  nafcillin 2 g in sodium chloride 0.9 % 100 mL IVPB  Status:  Discontinued     2 g 200 mL/hr over 30 Minutes Intravenous Every 4 hours 08/06/19 1359 09/12/19 1416   08/03/19 0900  fluconazole  (DIFLUCAN) tablet 200 mg     200 mg Oral  Once 08/03/19 0842 08/03/19 0917   08/02/19 2200  ceFAZolin (ANCEF) IVPB 2g/100 mL premix  Status:  Discontinued     2 g 200 mL/hr over 30 Minutes Intravenous Every 8 hours 08/02/19 1534 08/06/19 1310   08/02/19 1600  metroNIDAZOLE (FLAGYL) tablet 500 mg  Status:  Discontinued     500 mg Oral Every 8 hours 08/02/19 1534 08/06/19 1454   08/01/19 1200  vancomycin (VANCOCIN) IVPB 750 mg/150 ml premix  Status:  Discontinued     750 mg 150 mL/hr over 60 Minutes Intravenous Every 8 hours 08/01/19 0224 08/02/19 1533   08/01/19 1130  ceFAZolin (ANCEF) IVPB 2g/100 mL premix     2 g 200 mL/hr over 30 Minutes Intravenous 30 min pre-op 08/01/19 0828 08/01/19 1319   08/01/19 1000  ceFEPIme (MAXIPIME) 2 g in sodium chloride 0.9 % 100 mL IVPB  Status:  Discontinued     2 g 200 mL/hr over 30 Minutes Intravenous Every 8 hours 08/01/19 0224 08/02/19 1533   08/01/19 0100  ceFEPIme (MAXIPIME) 2 g in sodium chloride 0.9 % 100 mL IVPB  2 g 200 mL/hr over 30 Minutes Intravenous  Once 08/01/19 0056 08/01/19 0335   08/01/19 0100  metroNIDAZOLE (FLAGYL) IVPB 500 mg     500 mg 100 mL/hr over 60 Minutes Intravenous  Once 08/01/19 0056 08/01/19 0335   08/01/19 0100  vancomycin (VANCOCIN) IVPB 1000 mg/200 mL premix     1,000 mg 200 mL/hr over 60 Minutes Intravenous  Once 08/01/19 0056 08/01/19 0335     Medications: Scheduled Meds: . feeding supplement (ENSURE ENLIVE)  237 mL Oral BID BM  . insulin aspart  0-20 Units Subcutaneous TID WC  . insulin aspart  0-5 Units Subcutaneous QHS  . magnesium oxide  400 mg Oral BID  . Melatonin  6 mg Oral Once  . metoprolol tartrate  12.5 mg Oral BID  . ondansetron  4 mg Intravenous Once  . oxyCODONE  2.5 mg Oral QHS  . polyethylene glycol  17 g Oral Daily  . senna-docusate  2 tablet Oral BID  . sodium chloride flush  10-40 mL Intracatheter Q12H   Continuous Infusions: . sodium chloride 1,000 mL (08/24/19 2247)  .  ceFAZolin  (ANCEF) IV 2 g (09/17/19 0556)  . lactated ringers 10 mL/hr at 08/10/19 0845   PRN Meds:.sodium chloride, acetaminophen, calcium carbonate, cyclobenzaprine, ibuprofen, ondansetron (ZOFRAN) IV, oxyCODONE, promethazine, sodium chloride flush, traZODone   Objective: Weight change: 1.8 kg No intake or output data in the 24 hours ending 09/17/19 1223 Blood pressure (!) 149/97, pulse 85, temperature 99.3 F (37.4 C), temperature source Oral, resp. rate 16, height 6' 2.02" (1.88 m), weight 78.2 kg, SpO2 99 %. Temp:  [98.4 F (36.9 C)-99.3 F (37.4 C)] 99.3 F (37.4 C) (10/19 0749) Pulse Rate:  [84-96] 85 (10/19 0749) Resp:  [15-18] 16 (10/19 0749) BP: (120-149)/(86-97) 149/97 (10/19 0749) SpO2:  [99 %-100 %] 99 % (10/19 0749) Weight:  [78.2 kg] 78.2 kg (10/19 0500)  Physical Exam: General: Sitting up in chair comfortably HEENT: NCAT CV: RRR, normal S1 & S2 no murmurs rubs or gallops appreciated PULM: CTAB MSK: Swelling of R shoulder, Limited ROM  DERM: Well healing wound of the R shoulder with staples  NERUO: Alert and oriented, no focal deficits  CBC:    Component Value Date/Time   WBC 3.7 (L) 09/13/2019 0922   RBC 3.20 (L) 09/13/2019 0922   HGB 9.8 (L) 09/13/2019 0922   HCT 29.3 (L) 09/13/2019 0922   PLT 257 09/13/2019 0922   MCV 91.6 09/13/2019 0922   MCH 30.6 09/13/2019 0922   MCHC 33.4 09/13/2019 0922   RDW 15.4 09/13/2019 0922   LYMPHSABS 0.7 08/31/2019 1730   MONOABS 0.3 08/31/2019 1730   EOSABS 0.1 08/31/2019 1730   BASOSABS 0.0 08/31/2019 1730   BMET No results for input(s): NA, K, CL, CO2, GLUCOSE, BUN, CREATININE, CALCIUM in the last 72 hours. Sedimentation Rate No results for input(s): ESRSEDRATE in the last 72 hours. C-Reactive Protein No results for input(s): CRP in the last 72 hours. Micro Results: Recent Results (from the past 720 hour(s))  Aerobic/Anaerobic Culture (surgical/deep wound)     Status: None (Preliminary result)   Collection Time: 09/13/19   6:17 PM   Specimen: Soft Tissue, Other  Result Value Ref Range Status   Specimen Description TISSUE  Final   Special Requests RIGHT SUBACROMIAL BURSA ID A  Final   Gram Stain   Final    FEW WBC PRESENT, PREDOMINANTLY PMN NO ORGANISMS SEEN    Culture   Final    NO  GROWTH 4 DAYS NO ANAEROBES ISOLATED; CULTURE IN PROGRESS FOR 5 DAYS Performed at [facility], [address], [place], [place] [address]    Report Status PENDING  Incomplete  Aerobic/Anaerobic Culture (surgical/deep wound)     Status: None (Preliminary result)   Collection Time: 09/13/19  6:34 PM   Specimen: Soft Tissue, Other  Result Value Ref Range Status   Specimen Description ABSCESS  Final   Special Requests RIGHT HUMERAL INTRAMEDULLARY CANAL ID B  Final   Gram Stain   Final    RARE WBC PRESENT, PREDOMINANTLY PMN NO ORGANISMS SEEN    Culture   Final    NO GROWTH 4 DAYS NO ANAEROBES ISOLATED; CULTURE IN PROGRESS FOR 5 DAYS Performed at [facility], [address] [place], [place] [address]    Report Status PENDING  Incomplete  Aerobic/Anaerobic Culture (surgical/deep wound)     Status: None (Preliminary result)   Collection Time: 09/13/19  6:47 PM   Specimen: Bone; Tissue  Result Value Ref Range Status   Specimen Description BONE  Final   Special Requests HUMERAL HEAD ID C  Final   Gram Stain   Final    RARE WBC PRESENT, PREDOMINANTLY PMN NO ORGANISMS SEEN    Culture   Final    NO GROWTH 4 DAYS NO ANAEROBES ISOLATED; CULTURE IN PROGRESS FOR 5 DAYS Performed at [facility], [address], [place], [place] [address]    Report Status PENDING  Incomplete   Studies/Results: William [name] W Wo Contrast  Result Date: 09/16/2019 CLINICAL DATA:  Lumbar epidural abscess EXAM: MRI LUMBAR [name] WITHOUT AND WITH CONTRAST TECHNIQUE: Multiplanar and multiecho pulse sequences of the lumbar [name] were obtained without and with intravenous contrast. CONTRAST:  23mL GADAVIST GADOBUTROL 1 MMOL/ML IV  SOLN COMPARISON:  MRI 08/01/2019 FINDINGS: Segmentation:  Standard. Alignment:  Physiologic. Vertebrae: Marrow edema and enhancement with low T1 signal changes are again seen within the L5 and S1 vertebral bodies compatible with known osteomyelitis. There is decreased fluid signal within the L5-S1 disc compared to prior. Slight interval disc height loss compared to prior. The remaining osseous structures are within normal limits. Conus medullaris and cauda equina: Conus extends to the L1 level. Conus and cauda equina appear normal. Paraspinal and other soft tissues: Right paraspinal intramuscular edema with small residual peripherally enhancing fluid collection posterior to the right L4-5 facet joint measures 0.7 x 0.6 x 3.2 cm (series 16, image 25), previously 2.3 x 2.2 x 4.5 cm. Collection communicates with the right L4-5 facet joint with associated marrow edema and enhancement of the superior and articular process (series 19, image 27; series 18, image 4). No psoas muscle edema or fluid. Disc levels: The previously seen epidural abscess has resolved. No residual epidural fluid is evident. L1-L2: No significant disc protrusion, foraminal stenosis, or canal stenosis. L2-L3: No significant disc protrusion, foraminal stenosis, or canal stenosis. L3-L4: No significant disc protrusion, foraminal stenosis, or canal stenosis. L4-L5: Mild diffuse disc bulge and endplate spurring resulting in mild bilateral foraminal stenosis. There is soft tissue enhancement within the right neural foramen, less pronounced compared to prior. There is no canal stenosis. L5-S1: Abnormal enhancement within the disc with progressive disc height loss compared to prior. There is moderate bilateral foraminal stenosis. No canal stenosis. IMPRESSION: 1. Septic arthritis with osteomyelitis centered at the right L4-5 facet joint, progressed from prior. The associated communicating soft tissue abscess in the right paraspinal musculature has  significantly decreased  in size compared to prior. 2. Soft tissue enhancement within the right L4-5 neural foramina, likely reactive secondary to inflammatory process within the adjacent facet joint. 3. L5-S1 discitis-osteomyelitis with progressive disc height loss compared to prior. There is less abnormal fluid signal within the L5-S1 disc. No interval vertebral body collapse. 4. Complete resolution of the previously seen epidural abscess. These results will be called to the ordering clinician or representative by the Radiologist Assistant, and communication documented in the PACS or zVision Dashboard. Electronically Signed   By: Duanne Guess M.D.   On: 09/16/2019 15:22   Assessment/Plan:  Principal Problem:   Staphylococcus aureus bacteremia Active Problems:   Sepsis (HCC)   Abscess of paraspinous muscles   Osteomyelitis of lumbar [name] (HCC)   Abscess in epidural space of lumbar [name]   Abnormal chest CT   Septic embolism (HCC)   Community acquired pneumonia   IV drug abuse (HCC)   Heroin abuse (HCC)   Elevated troponin   UTI (urinary tract infection)   Renal failure   Hyperglycemia   Suspected endocarditis   Chest wall abscess   Abscess of right heel   Acute osteomyelitis of lumbar [name] (HCC)   Paraspinal abscess (HCC)   S/P chest tube placement (HCC)   Acute hematogenous osteomyelitis, right humerus (HCC)  In summary, William. Rampey is a 38 year old gentleman with past medical history significant for IV drug abuse who presented with disseminated MSSA infection. He has several abscesses located in the chest wall (s/p I&D w/ wound vac), lumbar paraspinal and epidural abcesses, sacral osteomyelitis and lumbar vertebral osteomyelitis/discitisosteomyelitis and most recently R humerus osteomyelitis.      [facility] Management Team Staphylococcus aureus bacteremia   Staphylococcus aureus bacteremia (SAB) is associated with a high rate of complications and mortality.   Specific aspects of clinical management are critical to optimizing the outcome of patients with SAB.  Therefore, the [facility] Antimicrobial Management Team [facility]) has initiated an intervention aimed at improving the management of SAB at [facility].  To do so, Infectious Diseases physicians are providing an evidence-based consult for the management of all patients with SAB.     Yes No Comments  Perform follow-up blood cultures (even if the patient is afebrile) to ensure clearance of bacteremia  Repeat blood cultures on 9/3, 9/6 and 9/10 have been negative      Remove vascular catheter and obtain follow-up blood cultures after the removal of the catheter     Perform echocardiography to evaluate for endocarditis (transthoracic ECHO is 40-50% sensitive, TEE is > 90% sensitive)  Repeat blood cultures on 9/3, 9/6 and 9/10 have been negative   Please keep in mind, that neither test can definitively EXCLUDE endocarditis, and that should clinical suspicion remain high for endocarditis the patient should then still be treated with an "endocarditis" duration of therapy = 6 weeks  Consult electrophysiologist to evaluate implanted cardiac device (pacemaker, ICD)     Ensure source control TEE completed on 9/02 demonstrated no evidence of valve vegetations suggesting endocarditis   Have all abscesses been drained effectively? Have deep seeded infections (septic joints or osteomyelitis) had appropriate surgical debridement?  Investigate for "metastatic" sites of infection   Does the patient have ANY symptom or physical exam finding that would suggest a deeper infection (back or neck pain that may be suggestive of vertebral osteomyelitis or epidural abscess, muscle pain that could be a symptom of pyomyositis)?  Keep in mind that for deep seeded infections MRI imaging  with contrast is preferred rather than other often insensitive tests such as plain x-rays, especially early in a  patient's presentation.  Change antibiotic therapy to  []  []  Beta-lactam antibiotics are preferred for MSSA due to higher cure rates.   If on Vancomycin, goal trough should be 15 - 20 mcg/mL  Estimated duration of IV antibiotic therapy:    Continue IV cefazolin for 6 weeks is the recommendation. Pt is not thrilled about this idea, but is willing to stay for 3 weeks for IV abx then switch to oral for 3 weeks. We discussed potentially doing a long acting abx such as dalbavacin.  [x]  []  Consult case management for probably prolonged outpatient IV antibiotic therapy    LOS: 47 days   Earlene Plater, MD Internal Medicine, PGY1 Pager: [phone]  09/17/2019,12:23 PM

## 2019-09-17 NOTE — Progress Notes (Signed)
Initial Nutrition Assessment  DOCUMENTATION CODES:   Not applicable  INTERVENTION:  Continue Ensure Enlive po BID, each supplement provides 350 kcal and 20 grams of protein  NUTRITION DIAGNOSIS:   Increased nutrient needs related to wound healing as evidenced by estimated needs. Ongoing  GOAL:   Patient will meet greater than or equal to 90% of their needs Met  MONITOR:   PO intake, Supplement acceptance, Skin, Weight trends, Labs, I & O's  REASON FOR ASSESSMENT:   Consult Diet education  ASSESSMENT:   37-year-old no previous medical history, heroin injection for last 10 years presented to emergency room on 9/1 with multiple complaints including lower back pain, left shoulder pain, right foot and heel pain. Pt with sepsis present on admission with MSSA bacteremia due to IV drug use. Septic pulmonary emboli, Left chest wall abscess, Right heel abscess, Extensive right posterolateral epidural abscess L2-L3, Osteomyelitis of the L5-S1, Posterior epidural abscess L5-S1 S2.  Patient eating 100% of meals at this time. Patient continues with good appetite/intake has been good and is drinking Enusre nutrition supplement.   Medications reviewed and include: SSI, mag-ox, senokot, ancef  Labs: CBGS 138-201 K 3.3 - trending up  Diet Order:   Diet Order            Diet regular Room service appropriate? Yes; Fluid consistency: Thin  Diet effective now              EDUCATION NEEDS:   Education needs have been addressed  Skin:  Skin Assessment: Skin Integrity Issues: Skin Integrity Issues:: Incisions Wound Vac: N/A Incisions: L chest  Last BM:  10/17  Height:   Ht Readings from Last 1 Encounters:  09/13/19 6' 2.02" (1.88 m)    Weight:   Wt Readings from Last 1 Encounters:  09/17/19 78.2 kg    Ideal Body Weight:  86.36 kg  BMI:  Body mass index is 22.13 kg/m.  Estimated Nutritional Needs:   Kcal:  2150-2350  Protein:  105-120 grams  Fluid:  >/= 2.1  L/day   Suzanne Clayton, RD, LDN Clinical Nutrition [facility] Telephone [phone] After Hours/Weekend Pager: [phone]  

## 2019-09-18 ENCOUNTER — Inpatient Hospital Stay (HOSPITAL_COMMUNITY): Payer: BC Managed Care – PPO

## 2019-09-18 DIAGNOSIS — R7881 Bacteremia: Secondary | ICD-10-CM | POA: Diagnosis not present

## 2019-09-18 DIAGNOSIS — B9561 Methicillin susceptible Staphylococcus aureus infection as the cause of diseases classified elsewhere: Secondary | ICD-10-CM | POA: Diagnosis not present

## 2019-09-18 DIAGNOSIS — T8459XA Infection and inflammatory reaction due to other internal joint prosthesis, initial encounter: Secondary | ICD-10-CM | POA: Diagnosis not present

## 2019-09-18 LAB — GLUCOSE, CAPILLARY
Glucose-Capillary: 132 mg/dL — ABNORMAL HIGH (ref 70–99)
Glucose-Capillary: 208 mg/dL — ABNORMAL HIGH (ref 70–99)
Glucose-Capillary: 92 mg/dL (ref 70–99)
Glucose-Capillary: 129 mg/dL — ABNORMAL HIGH (ref 70–99)

## 2019-09-18 MED ORDER — OXYCODONE HCL 5 MG PO TABS
5.0000 mg | ORAL_TABLET | ORAL | Status: DC | PRN
  Filled 2019-09-18: qty 1

## 2019-09-18 NOTE — Progress Notes (Signed)
Physical Therapy Discharge Patient Details Name: William [name] MRN: [id number] DOB: 1981/05/18 Today's Date: 09/18/2019 Time: 8916-9450 PT Time Calculation (min) (ACUTE ONLY): 28 min  Patient discharged from PT services secondary to goals met and no further PT needs identified.  Please see latest therapy progress note for current level of functioning and progress toward goals.    Progress and discharge plan discussed with patient and/or caregiver: Patient/Caregiver agrees with plan  GP     Chaneka Trefz Spero Geralds, PTA Acute Rehabilitation Services Pager [phone] Office [phone]   09/18/2019, 5:50 PM

## 2019-09-18 NOTE — Progress Notes (Signed)
PROGRESS NOTE  William [name] JFH:[id number] DOB: 1981/01/24 DOA: 07/31/2019 PCP: Patient, No Pcp Per  HPI/Recap of past 24 hours: William [name] is a 38 y.o. M with IVDU who presented with pain in the low back, left shoulder and right foot, as well as flu-like symptoms, progressing over a few days.  In the ER, imaging showed right heel abscess, left upper chest abscess and septic emboli.  Blood cultures obtained, started on empiric antibiotics, and admitted for presumptive endocarditis and for chest wall debridement.     Interim history: 9/2 patient admitted from ER to ICU, underwent debridement of chest wall abscess in the OR, placement of wound vac. MRI lumbar spine showed paraspinal abscess (2x2x4), epidural abscesses, sacral osteomyleitis and lumbar vertebral osteomyelitis/discitis Urine, blood and wound cultures from admission all growing MSSA but thankfully repeat blood cultures 9/3, 9/6, and 9/10 all without growth Ankle abscess I&D'd on 9/4 Transitioned to nafcillin on 9/7 Right shoulder bursa aspirated on 9/11  Patient defervesced and continued on nafcillin MRI pelvis and MRI right shoulder obtained on 10/11-13 showed progression of lumbar/sacral osteo, shrinkage but persistence of paraspinal abscess and newly noted osteomyelitis with abscess of the right humeral head.  Post I&D R shoulder on 09/13/19.  09/18/19: Patient was seen and examined at bedside this morning.  No acute events overnight.  Plan to continue with 3 weeks of IV cefazolin then transition to long-acting infusion + oral antibiotics per infectious disease.   Assessment/Plan: Principal Problem:   Staphylococcus aureus bacteremia Active Problems:   Sepsis ([place])   Abscess of paraspinous muscles   Osteomyelitis of lumbar spine (HCC)   Abscess in epidural space of lumbar spine   Abnormal chest CT   Septic embolism (HCC)   Community acquired pneumonia   IV drug abuse ([place])   Heroin abuse ([place])   Elevated  troponin   UTI (urinary tract infection)   Renal failure   Hyperglycemia   Suspected endocarditis   Chest wall abscess   Abscess of right heel   Acute osteomyelitis of lumbar spine (HCC)   Paraspinal abscess (HCC)   S/P chest tube placement (HCC)   Acute hematogenous osteomyelitis, right humerus (HCC)  Sepsis from MSSA bacteremia due to IVDA Lumbar osteomyelitis/discitis L2-4 Sacral osteomyelitis Paraspinal and epidural abscess New L4-L5 septic arthritis and progressive L5-S1 discitis/osteomyelitis Checked with IR on 09/11/2019, paraspinal abscess is too small to aspirate, further it is improving with IV nafcillin. -Seen by infectious disease, following -Continue nafcillin for now, defer adjustments to ID -Obtain weekly CBC/BMP, CRP ESR  -Need additional 6 weeks of antibiotics TTE done on 08/01/2019 no report of vegetation Continue IV abx as rec by ID Will obtain labs in the morning.  POD #5 post I&D on 09/13/2019 of newly diagnosed right humeral osteomyelitis with abscess of right proximal humerus/ Subacromial/subdeltoid septic bursitis in the setting of bacteremia MRI done on 09/11/2019 showed extensive right humeral head osteomyelitis, a humeral abscess and septic bursitis again. -Reconsult ID and Orthopedics, appreciate expert cares  Plan to be taken to the OR this afternoon Continue IV antibiotics Continue to optimize pain control Oxycodone 10 mg every 4 hours as needed for moderate to severe pain Control type 2 diabetes with hyperglycemia  Opiate-induced constipation Continue Senokot 2 tablets twice daily and MiraLAX daily Continue to encourage oral intake and avoid dehydration  Abscess right ankle  Resolved  Chest wall abscess s/p operative I&D with wound vac application 9/2 and 9/37 Septic pulmonary emboli Resolving well  Newly  diagnosed type II diabetes with hyperglycemia Glucose 700 at admission.  No previous diagnosis of diabetes.  No family history of  diabetes.  Not obese.  A1c here 7.8%. Increase sliding scale to resistant  IV drug abuse -Continue telephonic psychology counseling as needed Hepatitis C screening nonreactive, HIV nonreactive on 08/01/2019  Anemia of chronic disease -Weekly CBC  Hypertension BP controlled -Continue metoprolol, will need rx at discharge  Other medications -Continue trazodone, Flexeril for insomnia, will need rx at discharge    MDM and disposition: The below labs and imaging reports reviewed and summarized above.  Medication management as above.   The patient was admitted with MSSA bacteremia, septic pulmonary emboli, large chest wall abscess, right shoulder septic arthritis, and right ankle abscess.    Unfortunately, we have found now new foci of infection that will require adjustment of antibiotics and possibly orthopedic surgery.  Defer duration of antibiotics to ID, appreciate their and Orthopedics        DVT prophylaxis: SCDs for now, start Lovenox post-op Code Status: FULL Family Communication: Mother    Consultants:   CT surgery  Orthopedics  Infectious disease  Neurosurgery  Procedures:   9/2 MRI lumbar spine  9/2 I&D left chest wall abscess with wound vac by Dr. Servando Snare  9/4 I&D right heel abscess at bedside by Hilbert Odor, [facility]  9/11 Excision wound debridemnt, wound vac change by Dr. Prescott Gum  9/14 right shoulder bursa aspiration by Silvestre Gunner, PAC  10/12 MRI pelvis   10/13 MRI RIGHT shoulder  Antimicrobials:   Nafcillin   Culture data:   9/1 urine culture -- MSSA   9/2 blood culture x2 -- MSSA in 2/2  9/2 wound culture -- MSSA  9/2 chest wall abscess intraop culture -- MSSA  9/3 blood culture x2 -- NG  9/4 heel abscess culture -- MSSA  9/6 blood culture x2 -- NG  9/10 blood culture x2 -- NG  9/14 aspirate shoulder culture -- NG     Objective: Vitals:   09/18/19 0500 09/18/19 0611 09/18/19 0906  09/18/19 1156  BP:  129/82 (!) 155/114 129/74  Pulse:  63 (!) 107 82  Resp:  _0 Temp:  97.9 F (36.6 C) 98.5 F (36.9 C) 98.5 F (36.9 C)  TempSrc:  Oral Oral Oral  SpO2:  99% 100% 99%  Weight: 79 kg     Height:       No intake or output data in the 24 hours ending 09/18/19 1420 Filed Weights   09/16/19 0500 09/17/19 0500 09/18/19 0500  Weight: 76.4 kg 78.2 kg 79 kg    Exam:  . General: 38 y.o. year-old male well-developed well-nourished in no acute distress.  Alert and oriented x4. . Cardiovascular: Regular rate and rhythm no rubs or gallops no JVD no thyromegaly.   Marland Kitchen Respiratory: Clear to auscultation no wheezes no rales.  Good respiratory effort. . Abdomen: Soft nontender nondistended normal bowel sounds present. Musculoskeletal: Right shoulder in surgical dressing.   Psychiatry: Mood is appropriate for condition and setting..  Data Reviewed: CBC: Recent Labs  Lab 09/13/19 0922  WBC 3.7*  HGB 9.8*  HCT 29.3*  MCV 91.6  PLT 053   Basic Metabolic Panel: Recent Labs  Lab 09/12/19 1106 09/13/19 0922  NA 141 140  K 3.1* 3.3*  CL 102 105  CO2 27 25  GLUCOSE 101* 103*  BUN 11 16  CREATININE 0.82 0.72  CALCIUM 9.1 8.8*   GFR: Estimated Creatinine Clearance:  139.9 mL/min (by C-G formula based on SCr of 0.72 mg/dL). Liver Function Tests: No results for input(s): AST, ALT, ALKPHOS, BILITOT, PROT, ALBUMIN in the last 168 hours. No results for input(s): LIPASE, AMYLASE in the last 168 hours. No results for input(s): AMMONIA in the last 168 hours. Coagulation Profile: No results for input(s): INR, PROTIME in the last 168 hours. Cardiac Enzymes: No results for input(s): CKTOTAL, CKMB, CKMBINDEX, TROPONINI in the last 168 hours. BNP (last 3 results) No results for input(s): PROBNP in the last 8760 hours. HbA1C: No results for input(s): HGBA1C in the last 72 hours. CBG: Recent Labs  Lab 09/17/19 1147 09/17/19 1548 09/17/19 2124 09/18/19 0609 09/18/19  1153  GLUCAP 178* 160* 94 132* 208*   Lipid Profile: No results for input(s): CHOL, HDL, LDLCALC, TRIG, CHOLHDL, LDLDIRECT in the last 72 hours. Thyroid Function Tests: No results for input(s): TSH, T4TOTAL, FREET4, T3FREE, THYROIDAB in the last 72 hours. Anemia Panel: No results for input(s): VITAMINB12, FOLATE, FERRITIN, TIBC, IRON, RETICCTPCT in the last 72 hours. Urine analysis:    Component Value Date/Time   COLORURINE YELLOW 08/09/2019 1845   APPEARANCEUR CLEAR 08/09/2019 1845   LABSPEC 1.029 08/09/2019 1845   PHURINE 5.0 08/09/2019 1845   GLUCOSEU 50 (A) 08/09/2019 1845   HGBUR MODERATE (A) 08/09/2019 1845   BILIRUBINUR NEGATIVE 08/09/2019 1845   KETONESUR NEGATIVE 08/09/2019 1845   PROTEINUR 30 (A) 08/09/2019 1845   NITRITE NEGATIVE 08/09/2019 1845   LEUKOCYTESUR NEGATIVE 08/09/2019 1845   Sepsis Labs: _0 (procalcitonin:4,lacticidven:4)  ) Recent Results (from the past 240 hour(s))  Aerobic/Anaerobic Culture (surgical/deep wound)     Status: None (Preliminary result)   Collection Time: 09/13/19  6:17 PM   Specimen: Soft Tissue, Other  Result Value Ref Range Status   Specimen Description TISSUE  Final   Special Requests RIGHT SUBACROMIAL BURSA ID A  Final   Gram Stain   Final    FEW WBC PRESENT, PREDOMINANTLY PMN NO ORGANISMS SEEN    Culture   Final    NO GROWTH 5 DAYS NO ANAEROBES ISOLATED; CULTURE IN PROGRESS FOR 5 DAYS Performed at [facility] [address], [place], [place] [address]    Report Status PENDING  Incomplete  Aerobic/Anaerobic Culture (surgical/deep wound)     Status: None (Preliminary result)   Collection Time: 09/13/19  6:34 PM   Specimen: Soft Tissue, Other  Result Value Ref Range Status   Specimen Description ABSCESS  Final   Special Requests RIGHT HUMERAL INTRAMEDULLARY CANAL ID B  Final   Gram Stain   Final    RARE WBC PRESENT, PREDOMINANTLY PMN NO ORGANISMS SEEN    Culture   Final    NO GROWTH 5 DAYS NO ANAEROBES  ISOLATED; CULTURE IN PROGRESS FOR 5 DAYS Performed at [facility], [place] [address], [place], [place] [address]    Report Status PENDING  Incomplete  Aerobic/Anaerobic Culture (surgical/deep wound)     Status: None (Preliminary result)   Collection Time: 09/13/19  6:47 PM   Specimen: Bone; Tissue  Result Value Ref Range Status   Specimen Description BONE  Final   Special Requests HUMERAL HEAD ID C  Final   Gram Stain   Final    RARE WBC PRESENT, PREDOMINANTLY PMN NO ORGANISMS SEEN    Culture   Final    NO GROWTH 5 DAYS NO ANAEROBES ISOLATED; CULTURE IN PROGRESS FOR 5 DAYS Performed at [facility], [place] [address], [place], [place]  [address]    Report Status PENDING  Incomplete      Studies: Dg Chest 2 View  Result Date: 09/18/2019 CLINICAL DATA:  Recent shoulder surgery for infection. Rule out pneumonia EXAM: CHEST - 2 VIEW COMPARISON:  08/21/2019 FINDINGS: Skin staples and probable antibiotic beads over the proximal right humerus. There is no edema, consolidation, effusion, or pneumothorax. Normal heart size and mediastinal contours. IMPRESSION: No evidence of cardiopulmonary disease. Electronically Signed   By: Monte Fantasia M.D.   On: 09/18/2019 11:44    Scheduled Meds: . feeding supplement (ENSURE ENLIVE)  237 mL Oral BID BM  . insulin aspart  0-20 Units Subcutaneous TID WC  . insulin aspart  0-5 Units Subcutaneous QHS  . magnesium oxide  400 mg Oral BID  . Melatonin  6 mg Oral Once  . metoprolol tartrate  12.5 mg Oral BID  . ondansetron  4 mg Intravenous Once  . oxyCODONE  2.5 mg Oral QHS  . polyethylene glycol  17 g Oral Daily  . senna-docusate  2 tablet Oral BID  . sodium chloride flush  10-40 mL Intracatheter Q12H    Continuous Infusions: . sodium chloride 1,000 mL (08/24/19 2247)  .  ceFAZolin (ANCEF) IV 2 g (09/18/19 0520)  . lactated ringers 10 mL/hr at 08/10/19 0845     LOS: 65 days     Kayleen Memos, MD [facility] Pager  [phone]  If 7PM-7AM, please contact night-coverage [email or web address] Password [email or web address] 09/18/2019, 2:20 PM

## 2019-09-18 NOTE — Progress Notes (Signed)
Physical Therapy Treatment Patient Details Name: William [name] MRN: [id number] DOB: 03/10/1981 Today's Date: 09/18/2019    History of Present Illness Patient is a 38 y/o male who presents with lower back pain, left shoulder pain, right foot and heel pain. Found to have right heel abscess s/p I&D 9/4, left upper chest wall abscess and mass s/p I&D and wound vac application 9/3, new onset DM, sepsis with MSSA bacteremia due to IV drug use. CT scan- left anterior chest wall soft tissue and intramuscular infection with extension into the thoracic cavity, air in the soft tissues of the right axilla adjacent to the right shoulder, multiple small pulmonary nodules. PMH- heroin IV drug use.    PT Comments    Pt performed gt training and stair training with independence.  He has met all goals and will no longer require PT services during acute stay.  He reports he has been ambulating with his mother and girlfriend around the unit.  Pt denies back pain and only complains of lack of ROM on R shoulder.  OT to address needs for R shoulder and PT to sign off at this time.  Will inform supervising PT of need to d/c order and address POC at this time.  Pt is in agreement.     Follow Up Recommendations  Supervision for mobility/OOB;Outpatient PT     Equipment Recommendations  None recommended by PT    Recommendations for Other Services       Precautions / Restrictions Precautions Precautions: Fall Precaution Comments: shoulder surgery on 10/15 Restrictions Weight Bearing Restrictions: Yes RUE Weight Bearing: Non weight bearing    Mobility  Bed Mobility               General bed mobility comments: OOB in chair  Transfers Overall transfer level: Modified independent Equipment used: None Transfers: Sit to/from Stand           General transfer comment: Pt stood from rolling computer chair with no issues  Ambulation/Gait Ambulation/Gait assistance: Independent Gait Distance (Feet):  2000 Feet Assistive device: None Gait Pattern/deviations: Step-through pattern     General Gait Details: No issues good reciprocal armswing noted without cueing.   Stairs Stairs: Yes Stairs assistance: Modified independent (Device/Increase time) Stair Management: One rail Right Number of Stairs: 20 General stair comments: Pt can perform reciprocal pattern with minor pain.  NO physical assistance needed.  Minor cues for sequencing to decrease pain when his back is aggravated.   Wheelchair Mobility    Modified Rankin (Stroke Patients Only)       Balance     Sitting balance-Leahy Scale: Normal       Standing balance-Leahy Scale: Normal                              Cognition Arousal/Alertness: Awake/alert Behavior During Therapy: WFL for tasks assessed/performed Overall Cognitive Status: Within Functional Limits for tasks assessed                                        Exercises      General Comments        Pertinent Vitals/Pain Pain Assessment: Faces Faces Pain Scale: Hurts little more Pain Location: R shoulder Pain Descriptors / Indicators: Aching Pain Intervention(s): Monitored during session;Repositioned    Home Living  Prior Function            PT Goals (current goals can now be found in the care plan section) Acute Rehab PT Goals Patient Stated Goal: go home Potential to Achieve Goals: Good Progress towards PT goals: Progressing toward goals    Frequency    Min 2X/week      PT Plan Current plan remains appropriate    Co-evaluation              AM-PAC PT "6 Clicks" Mobility   Outcome Measure  Help needed turning from your back to your side while in a flat bed without using bedrails?: None Help needed moving from lying on your back to sitting on the side of a flat bed without using bedrails?: None Help needed moving to and from a bed to a chair (including a wheelchair)?:  None Help needed standing up from a chair using your arms (e.g., wheelchair or bedside chair)?: None Help needed to walk in hospital room?: None Help needed climbing 3-5 steps with a railing? : None 6 Click Score: 24    End of Session   Activity Tolerance: Patient tolerated treatment well Patient left: in bed;with call bell/phone within reach;with bed alarm set Nurse Communication: Mobility status PT Visit Diagnosis: Muscle weakness (generalized) (M62.81);Pain;Difficulty in walking, not elsewhere classified (R26.2) Pain - Right/Left: Right Pain - part of body: Shoulder     Time: [id number] PT Time Calculation (min) (ACUTE ONLY): 28 min  Charges:  $Gait Training: 23-37 mins                     William [name], PTA Acute Rehabilitation Services Pager [phone] Office [phone]     Cristela Blue 09/18/2019, 5:48 PM

## 2019-09-19 LAB — AEROBIC/ANAEROBIC CULTURE W GRAM STAIN (SURGICAL/DEEP WOUND)
Culture: NO GROWTH
Culture: NO GROWTH
Culture: NO GROWTH

## 2019-09-19 LAB — GLUCOSE, CAPILLARY
Glucose-Capillary: 138 mg/dL — ABNORMAL HIGH (ref 70–99)
Glucose-Capillary: 194 mg/dL — ABNORMAL HIGH (ref 70–99)
Glucose-Capillary: 118 mg/dL — ABNORMAL HIGH (ref 70–99)
Glucose-Capillary: 175 mg/dL — ABNORMAL HIGH (ref 70–99)

## 2019-09-19 LAB — BASIC METABOLIC PANEL
Anion gap: 9 (ref 5–15)
BUN: 18 mg/dL (ref 6–20)
CO2: 29 mmol/L (ref 22–32)
Calcium: 9 mg/dL (ref 8.9–10.3)
Chloride: 104 mmol/L (ref 98–111)
Creatinine, Ser: 0.72 mg/dL (ref 0.61–1.24)
GFR calc Af Amer: >60 mL/min (ref >60)
GFR calc non Af Amer: >60 mL/min (ref >60)
Glucose, Bld: 89 mg/dL (ref 70–99)
Potassium: 3.4 mmol/L — ABNORMAL LOW (ref 3.5–5.1)
Sodium: 142 mmol/L (ref 135–145)

## 2019-09-19 LAB — CBC
HCT: 27.6 % — ABNORMAL LOW (ref 39.0–52.0)
Hemoglobin: 9 g/dL — ABNORMAL LOW (ref 13.0–17.0)
MCH: 29.5 pg (ref 26.0–34.0)
MCHC: 32.6 g/dL (ref 30.0–36.0)
MCV: 90.5 fL (ref 80.0–100.0)
Platelets: 266 10*3/uL (ref 150–400)
RBC: 3.05 MIL/uL — ABNORMAL LOW (ref 4.22–5.81)
RDW: 14.6 % (ref 11.5–15.5)
WBC: 3.9 10*3/uL — ABNORMAL LOW (ref 4.0–10.5)
nRBC: 0 % (ref 0.0–0.2)

## 2019-09-19 LAB — SEDIMENTATION RATE: Sed Rate: 70 mm/hr — ABNORMAL HIGH (ref 0–16)

## 2019-09-19 LAB — C-REACTIVE PROTEIN: CRP: 7.4 mg/dL — ABNORMAL HIGH (ref <1.0)

## 2019-09-19 MED ORDER — POTASSIUM CHLORIDE CRYS ER 20 MEQ PO TBCR
40.0000 meq | EXTENDED_RELEASE_TABLET | Freq: Every day | ORAL | Status: AC
  Administered 2019-09-19 – 2019-09-20 (×2): 40 meq via ORAL
  Filled 2019-09-19 (×2): qty 2

## 2019-09-19 MED ORDER — ENOXAPARIN SODIUM 40 MG/0.4ML ~~LOC~~ SOLN
40.0000 mg | SUBCUTANEOUS | Status: DC
  Filled 2019-09-19 (×8): qty 0.4

## 2019-09-19 NOTE — Progress Notes (Signed)
Chaplain followed up with William [name] to schedule a visit in which they can go outside to get fresh air and sunlight.  That visit is scheduled to take place 09/20/19 around 1300.  Brion Aliment Chaplain Resident For questions concerning this note please contact me by pager [phone]

## 2019-09-19 NOTE — Progress Notes (Signed)
Pt's medline is positional and he didn't want to troubleshoot for his morning antibiotic. Pt requested to stop it. RN txt paged [facility].

## 2019-09-19 NOTE — Progress Notes (Signed)
Occupational Therapy Treatment Patient Details Name: William [name] MRN: [id number] DOB: 10-04-1981 Today's Date: 09/19/2019    History of present illness Patient is a 38 y/o male who presents with lower back pain, left shoulder pain, right foot and heel pain. Found to have right heel abscess s/p I&D 9/4, left upper chest wall abscess and mass s/p I&D and wound vac application 9/3, new onset DM, sepsis with MSSA bacteremia due to IV drug use. CT scan- left anterior chest wall soft tissue and intramuscular infection. Right subdeltoid, subacromial septic bursitis, proximal humerus osteomyelitis with abscess in humeral shaft s/p excision of bursa, saucerization of proximal humerus and humeral shaft: DOS: 09/13/2019. PMH- heroin IV drug use.    OT comments  Pt progressing. Re-assessment of patient s/p humeral sx on RUE. Pt unaware of meaning of NWB so pt had been using RUE to push off from. Pt made of aware of precautions and pt abiding by them for remainder of session. Shoulder flex to 90* AROM and about 135* AAROM; shoulder abduction 60* AROM and 100* AAROM. Pt's PROM close to [facility]. Pt given HEP for AROM and AAROM. OTR performed PROM to R shoulder in all planes. Pt would benefit from continued OT skilled services for ADL, mobility and safety in OP OT setting. OT following acutely.   Follow Up Recommendations  Outpatient OT    Equipment Recommendations  None recommended by OT    Recommendations for Other Services      Precautions / Restrictions Precautions Precautions: Fall Precaution Comments: shoulder surgery on 10/15 Restrictions Weight Bearing Restrictions: Yes RUE Weight Bearing: Non weight bearing       Mobility Bed Mobility Overal bed mobility: Needs Assistance   Rolling: Supervision Sidelying to sit: Supervision Supine to sit: Supervision     General bed mobility comments: supervisionA for nonuse of RUE  Transfers Overall transfer level: Modified independent Equipment  used: None Transfers: Sit to/from Stand           General transfer comment: Pt stood from rolling computer chair with no issues    Balance Overall balance assessment: Needs assistance   Sitting balance-Leahy Scale: Normal       Standing balance-Leahy Scale: Normal                             ADL either performed or assessed with clinical judgement   ADL Overall ADL's : Needs assistance/impaired                                     Functional mobility during ADLs: Min guard;Rolling walker General ADL Comments: Pt sitting in chair watching tv with family in room. Pt performing RUE HEP.     Vision Baseline Vision/History: No visual deficits Vision Assessment?: No apparent visual deficits   Perception     Praxis      Cognition Arousal/Alertness: Awake/alert Behavior During Therapy: WFL for tasks assessed/performed Overall Cognitive Status: Difficult to assess Area of Impairment: Problem solving;Safety/judgement                         Safety/Judgement: Decreased awareness of safety;Decreased awareness of deficits   Problem Solving: Requires verbal cues General Comments: Pt unaware of meaning of NWB so pt had been using RUE to push off from. Pt made of aware of precautions and pt abiding by them  for remainder of session.        Exercises General Exercises - Upper Extremity Shoulder Flexion: PROM;10 reps;Right Shoulder ABduction: PROM;10 reps;Supine Other Exercises Other Exercises: PROM shoulder external rotation- 10 reps Other Exercises: Shoulder IR/ER AAROM x10 reps Other Exercises: Shoulder extension x10 reps, AROM   Shoulder Instructions       General Comments Pt given RUE HEP for AROM and AAROM with pictures and family in room to ensure accountability.    Pertinent Vitals/ Pain       Pain Assessment: 0-10 Pain Score: 2  Pain Location: R shoulder Pain Descriptors / Indicators: Aching Pain Intervention(s):  Repositioned  Home Living Family/patient expects to be discharged to:: Private residence Living Arrangements: Other relatives Available Help at Discharge: Friend(s);Available PRN/intermittently Type of Home: House Home Access: Stairs to enter [employer] of Steps: 2 Entrance Stairs-Rails: Right Home Layout: Two level;Able to live on main level with bedroom/bathroom         Bathroom Toilet: Standard     Home Equipment: None          Prior Functioning/Environment Level of Independence: Independent        Comments: 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   Frequency  Min 2X/week        Progress Toward Goals  OT Goals(current goals can now be found in the care plan section)  Progress towards OT goals: Progressing toward goals  Acute Rehab OT Goals Patient Stated Goal: go home OT Goal Formulation: With patient Time For Goal Achievement: 10/04/19 Potential to Achieve Goals: Good ADL Goals Pt/caregiver will Perform Home Exercise Program: Right Upper extremity;Increased strength;Increased ROM;With written HEP provided Additional ADL Goal #1: Pt will increase to independence with OOB ADL with RUE pain <2/10.  Plan Discharge plan remains appropriate    Co-evaluation                 AM-PAC OT "6 Clicks" Daily Activity     Outcome Measure   Help from another person eating meals?: None Help from another person taking care of personal grooming?: None Help from another person toileting, which includes using toliet, bedpan, or urinal?: None Help from another person bathing (including washing, rinsing, drying)?: A Little Help from another person to put on and taking off regular upper body clothing?: None Help from another person to put on and taking off regular lower body clothing?: None 6 Click Score: 23    End of Session    OT Visit Diagnosis: Unsteadiness on feet (R26.81);Muscle weakness (generalized) (M62.81)   Activity  Tolerance Patient tolerated treatment well   Patient Left in chair;with call bell/phone within reach   Nurse Communication Mobility status        Time: 1135-1204 OT Time Calculation (min): 29 min  Charges: OT General Charges $OT Visit: 1 Visit OT Treatments $Therapeutic Exercise: 23-37 mins  Revonda Standard Cecil Cranker) Glendell Docker OTR/L Acute Rehabilitation Services Pager: [phone] Office: [phone]   Lonzo Cloud 09/19/2019, 4:28 PM

## 2019-09-19 NOTE — Consult Note (Signed)
Subjective: Pt seen at the bedside this AM. Pt was asleep. Says his shoulder is feeling much better today than pervious days. I informed the patient that his cultures have remained negative and we will plan to continue the IV abx. Pt in agreement. No complaints at this time.   Antibiotics:  Anti-infectives (From admission, onward)   Start     Dose/Rate Route Frequency Ordered Stop   09/13/19 1813  gentamicin (GARAMYCIN) injection  Status:  Discontinued       As needed 09/13/19 1814 09/13/19 1923   09/13/19 1811  vancomycin (VANCOCIN) powder  Status:  Discontinued       As needed 09/13/19 1812 09/13/19 1923   09/13/19 1732  polymyxin B 500,000 Units, bacitracin 50,000 Units in sodium chloride 0.9 % 500 mL irrigation  Status:  Discontinued       As needed 09/13/19 1733 09/13/19 1923   09/13/19 0945  ceFAZolin (ANCEF) IVPB 2g/100 mL premix     2 g 200 mL/hr over 30 Minutes Intravenous On call to O.R. 09/13/19 0938 09/13/19 1800   09/12/19 1500  ceFAZolin (ANCEF) IVPB 2g/100 mL premix     2 g 200 mL/hr over 30 Minutes Intravenous Every 8 hours 09/12/19 1416     08/10/19 0936  vancomycin (VANCOCIN) 1,000 mg in sodium chloride 0.9 % 1,000 mL irrigation  Status:  Discontinued       As needed 08/10/19 0937 08/10/19 1121   08/10/19 0600  ceFAZolin (ANCEF) IVPB 2g/100 mL premix     2 g 200 mL/hr over 30 Minutes Intravenous To Short Stay 08/10/19 0036 08/10/19 0625   08/09/19 1730  ceFAZolin (ANCEF) IVPB 2g/100 mL premix  Status:  Discontinued     2 g 200 mL/hr over 30 Minutes Intravenous 30 min pre-op 08/09/19 1730 08/10/19 0036   08/06/19 1430  nafcillin injection 2 g  Status:  Discontinued     2 g Intravenous Every 4 hours 08/06/19 1310 08/06/19 1359   08/06/19 1430  nafcillin 2 g in sodium chloride 0.9 % 100 mL IVPB  Status:  Discontinued     2 g 200 mL/hr over 30 Minutes Intravenous Every 4 hours 08/06/19 1359 09/12/19 1416   08/03/19 0900  fluconazole (DIFLUCAN) tablet 200 mg     200  mg Oral  Once 08/03/19 0842 08/03/19 0917   08/02/19 2200  ceFAZolin (ANCEF) IVPB 2g/100 mL premix  Status:  Discontinued     2 g 200 mL/hr over 30 Minutes Intravenous Every 8 hours 08/02/19 1534 08/06/19 1310   08/02/19 1600  metroNIDAZOLE (FLAGYL) tablet 500 mg  Status:  Discontinued     500 mg Oral Every 8 hours 08/02/19 1534 08/06/19 1454   08/01/19 1200  vancomycin (VANCOCIN) IVPB 750 mg/150 ml premix  Status:  Discontinued     750 mg 150 mL/hr over 60 Minutes Intravenous Every 8 hours 08/01/19 0224 08/02/19 1533   08/01/19 1130  ceFAZolin (ANCEF) IVPB 2g/100 mL premix     2 g 200 mL/hr over 30 Minutes Intravenous 30 min pre-op 08/01/19 0828 08/01/19 1319   08/01/19 1000  ceFEPIme (MAXIPIME) 2 g in sodium chloride 0.9 % 100 mL IVPB  Status:  Discontinued     2 g 200 mL/hr over 30 Minutes Intravenous Every 8 hours 08/01/19 0224 08/02/19 1533   08/01/19 0100  ceFEPIme (MAXIPIME) 2 g in sodium chloride 0.9 % 100 mL IVPB     2 g 200 mL/hr over 30 Minutes Intravenous  Once 08/01/19 0056 08/01/19 0335   08/01/19 0100  metroNIDAZOLE (FLAGYL) IVPB 500 mg     500 mg 100 mL/hr over 60 Minutes Intravenous  Once 08/01/19 0056 08/01/19 0335   08/01/19 0100  vancomycin (VANCOCIN) IVPB 1000 mg/200 mL premix     1,000 mg 200 mL/hr over 60 Minutes Intravenous  Once 08/01/19 0056 08/01/19 0335     Medications: Scheduled Meds: . feeding supplement (ENSURE ENLIVE)  237 mL Oral BID BM  . insulin aspart  0-20 Units Subcutaneous TID WC  . insulin aspart  0-5 Units Subcutaneous QHS  . magnesium oxide  400 mg Oral BID  . Melatonin  6 mg Oral Once  . metoprolol tartrate  12.5 mg Oral BID  . ondansetron  4 mg Intravenous Once  . oxyCODONE  2.5 mg Oral QHS  . polyethylene glycol  17 g Oral Daily  . senna-docusate  2 tablet Oral BID  . sodium chloride flush  10-40 mL Intracatheter Q12H   Continuous Infusions: . sodium chloride 1,000 mL (08/24/19 2247)  .  ceFAZolin (ANCEF) IV Stopped (09/19/19  [id number])  . lactated ringers 10 mL/hr at 08/10/19 0845   PRN Meds:.sodium chloride, acetaminophen, calcium carbonate, cyclobenzaprine, ibuprofen, ondansetron (ZOFRAN) IV, oxyCODONE, promethazine, sodium chloride flush, traZODone   Objective: Weight change:  No intake or output data in the 24 hours ending 09/19/19 0901 Blood pressure (!) 142/98, pulse 83, temperature 98.5 F (36.9 C), temperature source Oral, resp. rate 18, height 6' 2.02" (1.88 m), weight 79 kg, SpO2 99 %. Temp:  [98.4 F (36.9 C)-98.5 F (36.9 C)] 98.5 F (36.9 C) (10/20 1950) Pulse Rate:  [82-107] 83 (10/20 1950) Resp:  [16-18] 18 (10/20 1950) BP: (129-156)/(74-114) 142/98 (10/20 1950) SpO2:  [99 %-100 %] 99 % (10/20 1950)  Physical Exam: General: Laying in bed, NAD HEENT: NCAT PULM: Normal WOB  NERUO: Alert and oriented, no focal deficits  CBC:    Component Value Date/Time   WBC 3.7 (L) 09/13/2019 0922   RBC 3.20 (L) 09/13/2019 0922   HGB 9.8 (L) 09/13/2019 0922   HCT 29.3 (L) 09/13/2019 0922   PLT 257 09/13/2019 0922   MCV 91.6 09/13/2019 0922   MCH 30.6 09/13/2019 0922   MCHC 33.4 09/13/2019 0922   RDW 15.4 09/13/2019 0922   LYMPHSABS 0.7 08/31/2019 1730   MONOABS 0.3 08/31/2019 1730   EOSABS 0.1 08/31/2019 1730   BASOSABS 0.0 08/31/2019 1730   Micro Results: Recent Results (from the past 720 hour(s))  Aerobic/Anaerobic Culture (surgical/deep wound)     Status: None (Preliminary result)   Collection Time: 09/13/19  6:17 PM   Specimen: Soft Tissue, Other  Result Value Ref Range Status   Specimen Description TISSUE  Final   Special Requests RIGHT SUBACROMIAL BURSA ID A  Final   Gram Stain   Final    FEW WBC PRESENT, PREDOMINANTLY PMN NO ORGANISMS SEEN    Culture   Final    NO GROWTH 5 DAYS NO ANAEROBES ISOLATED; CULTURE IN PROGRESS FOR 5 DAYS Performed at [facility], [address] [place], [place] [address]    Report Status PENDING  Incomplete  Aerobic/Anaerobic Culture  (surgical/deep wound)     Status: None (Preliminary result)   Collection Time: 09/13/19  6:34 PM   Specimen: Soft Tissue, Other  Result Value Ref Range Status   Specimen Description ABSCESS  Final   Special Requests RIGHT HUMERAL INTRAMEDULLARY CANAL ID B  Final   Gram Stain  Final    RARE WBC PRESENT, PREDOMINANTLY PMN NO ORGANISMS SEEN    Culture   Final    NO GROWTH 5 DAYS NO ANAEROBES ISOLATED; CULTURE IN PROGRESS FOR 5 DAYS Performed at [facility], [address] [place], [place] [address]    Report Status PENDING  Incomplete  Aerobic/Anaerobic Culture (surgical/deep wound)     Status: None (Preliminary result)   Collection Time: 09/13/19  6:47 PM   Specimen: Bone; Tissue  Result Value Ref Range Status   Specimen Description BONE  Final   Special Requests HUMERAL HEAD ID C  Final   Gram Stain   Final    RARE WBC PRESENT, PREDOMINANTLY PMN NO ORGANISMS SEEN    Culture   Final    NO GROWTH 5 DAYS NO ANAEROBES ISOLATED; CULTURE IN PROGRESS FOR 5 DAYS Performed at [facility], [address] [place], [place] [address]    Report Status PENDING  Incomplete   Studies/Results: Dg Chest 2 View  Result Date: 09/18/2019 CLINICAL DATA:  Recent shoulder surgery for infection. Rule out pneumonia EXAM: CHEST - 2 VIEW COMPARISON:  08/21/2019 FINDINGS: Skin staples and probable antibiotic beads over the proximal right humerus. There is no edema, consolidation, effusion, or pneumothorax. Normal heart size and mediastinal contours. IMPRESSION: No evidence of cardiopulmonary disease. Electronically Signed   By: Marnee Spring M.D.   On: 09/18/2019 11:44   Assessment/Plan:  Principal Problem:   Staphylococcus aureus bacteremia Active Problems:   Sepsis (HCC)   Abscess of paraspinous muscles   Osteomyelitis of lumbar spine (HCC)   Abscess in epidural space of lumbar spine   Abnormal chest CT   Septic embolism (HCC)   Community acquired pneumonia   IV drug abuse  (HCC)   Heroin abuse (HCC)   Elevated troponin   UTI (urinary tract infection)   Renal failure   Hyperglycemia   Suspected endocarditis   Chest wall abscess   Abscess of right heel   Acute osteomyelitis of lumbar spine (HCC)   Paraspinal abscess (HCC)   S/P chest tube placement (HCC)   Acute hematogenous osteomyelitis, right humerus (HCC)  In summary, William [name] is a 38 year old gentleman with past medical history significant for IV drug abuse who presented with disseminated MSSA infection. He has several abscesses located in the chest wall (s/p I&D w/ wound vac), lumbar paraspinal and epidural abcesses, sacral osteomyelitis and lumbar vertebral osteomyelitis/discitisosteomyelitis and most recently R humerus osteomyelitis.      [place] Staphylococcus aureus bacteremia   Staphylococcus aureus bacteremia (SAB) is associated with a high rate of complications and mortality.  Specific aspects of clinical management are critical to optimizing the outcome of patients with SAB.  Therefore, the [facility] Antimicrobial Management Team [facility]) has initiated an intervention aimed at improving the management of SAB at [facility].  To do so, Infectious Diseases physicians are providing an evidence-based consult for the management of all patients with SAB.     Yes No Comments  Perform follow-up blood cultures (even if the patient is afebrile) to ensure clearance of bacteremia  Repeat blood cultures on 9/3, 9/6 and 9/10 have been negative.  Surgical cultures have remained negative as well.  [x]  []    Remove vascular catheter and obtain follow-up blood cultures after the removal of the catheter [x]  []    Perform echocardiography to evaluate for endocarditis (transthoracic ECHO is 40-50% sensitive, TEE is > 90% sensitive)  TEE completed on 9/02 demonstrated  no evidence of valve vegetations suggesting endocarditis  Repeat blood cultures on 9/3, 9/6 and 9/10 have been  negative [x]  []  Please keep in mind, that neither test can definitively EXCLUDE endocarditis, and that should clinical suspicion remain high for endocarditis the patient should then still be treated with an "endocarditis" duration of therapy = 6 weeks  Consult electrophysiologist to evaluate implanted cardiac device (pacemaker, ICD) []  []    Ensure source control TEE completed on 9/02 demonstrated no evidence of valve vegetations suggesting endocarditis [x]  []  Have all abscesses been drained effectively? Have deep seeded infections (septic joints or osteomyelitis) had appropriate surgical debridement?  Investigate for "metastatic" sites of infection [x]  []  Does the patient have ANY symptom or physical exam finding that would suggest a deeper infection (back or neck pain that may be suggestive of vertebral osteomyelitis or epidural abscess, muscle pain that could be a symptom of pyomyositis)?  Keep in mind that for deep seeded infections MRI imaging with contrast is preferred rather than other often insensitive tests such as plain x-rays, especially early in a patient's presentation.  Change antibiotic therapy to  []  []  Beta-lactam antibiotics are preferred for MSSA due to higher cure rates.   If on Vancomycin, goal trough should be 15 - 20 mcg/mL  Estimated duration of IV antibiotic therapy:    Plan is to continue IV cefazolin for 6 weeks is the recommendation. Pt is willing to stay for 3 weeks for IV abx then switch to oral for 3 weeks. We discussed doing a long acting abx such as dalbavacin upon d/c with follow up in the ID clinic. [x]  []  Consult case management for probably prolonged outpatient IV antibiotic therapy    LOS: 49 days   Earlene Plater, MD Internal Medicine, PGY1 Pager: [phone]  09/19/2019,9:01 AM

## 2019-09-19 NOTE — Progress Notes (Signed)
PROGRESS NOTE  William [name] BBC:[id number] DOB: Dec 03, 1980 DOA: 07/31/2019 PCP: Patient, No Pcp Per  HPI/Recap of past 24 hours: William [name] is a 38 y.o. M with IVDU who presented with pain in the low back, left shoulder and right foot, as well as flu-like symptoms, progressing over a few days.  In the ER, imaging showed right heel abscess, left upper chest abscess and septic emboli.  Blood cultures obtained, started on empiric antibiotics, and admitted for presumptive endocarditis and for chest wall debridement.     Interim history: 9/2 patient admitted from ER to ICU, underwent debridement of chest wall abscess in the OR, placement of wound vac. MRI lumbar spine showed paraspinal abscess (2x2x4), epidural abscesses, sacral osteomyleitis and lumbar vertebral osteomyelitis/discitis Urine, blood and wound cultures from admission all growing MSSA but thankfully repeat blood cultures 9/3, 9/6, and 9/10 all without growth Ankle abscess I&D'd on 9/4 Transitioned to nafcillin on 9/7 Right shoulder bursa aspirated on 9/11  Patient defervesced and continued on nafcillin MRI pelvis and MRI right shoulder obtained on 10/11-13 showed progression of lumbar/sacral osteo, shrinkage but persistence of paraspinal abscess and newly noted osteomyelitis with abscess of the right humeral head.  Post I&D R shoulder on 09/13/19.  09/19/19: Seen and examined. No new complaints. Labs draw, hypokalemia repleted. Possible malfunction of his picc line. IV team consulted to assess.    Assessment/Plan: Principal Problem:   Staphylococcus aureus bacteremia Active Problems:   Sepsis ([place])   Abscess of paraspinous muscles   Osteomyelitis of lumbar spine (HCC)   Abscess in epidural space of lumbar spine   Abnormal chest CT   Septic embolism (HCC)   Community acquired pneumonia   IV drug abuse ([place])   Heroin abuse ([place])   Elevated troponin   UTI (urinary tract infection)   Renal failure  Hyperglycemia   Suspected endocarditis   Chest wall abscess   Abscess of right heel   Acute osteomyelitis of lumbar spine (HCC)   Paraspinal abscess (HCC)   S/P chest tube placement (HCC)   Acute hematogenous osteomyelitis, right humerus (HCC)  Sepsis from MSSA bacteremia due to IVDA Lumbar osteomyelitis/discitis L2-4 Sacral osteomyelitis Paraspinal and epidural abscess New L4-L5 septic arthritis and progressive L5-S1 discitis/osteomyelitis Checked with IR on 09/11/2019, paraspinal abscess is too small to aspirate, further it is improving with IV nafcillin. -Seen by infectious disease, following -Continue nafcillin for now, defer adjustments to ID -Obtain weekly CBC/BMP, CRP ESR  -Need additional 6 weeks of antibiotics TTE done on 08/01/2019 no report of vegetation Continue IV abx as rec by ID Will obtain labs in the morning.  POD #6 post I&D on 09/13/2019 of newly diagnosed right humeral osteomyelitis with abscess of right proximal humerus/ Subacromial/subdeltoid septic bursitis in the setting of bacteremia MRI done on 09/11/2019 showed extensive right humeral head osteomyelitis, a humeral abscess and septic bursitis again. -Reconsult ID and Orthopedics, appreciate expert cares  Plan to be taken to the OR this afternoon Continue IV antibiotics Continue to optimize pain control Oxycodone 10 mg every 4 hours as needed for moderate to severe pain Control type 2 diabetes with hyperglycemia  Hypokalemia K+ 3.4 repleted with po kcl 40 meq daily x 2 days  Possible malfunction of picc line IV team consulted to assess  Opiate-induced constipation Continue Senokot 2 tablets twice daily and MiraLAX daily Continue to encourage oral intake and avoid dehydration  Abscess right ankle  Resolved  Chest wall abscess s/p operative I&D with wound vac application  9/2 and 9/11 Septic pulmonary emboli Resolving well  Newly diagnosed type II diabetes with hyperglycemia Glucose 700 at  admission.  No previous diagnosis of diabetes.  No family history of diabetes.  Not obese.  A1c here 7.8%. Increase sliding scale to resistant  IV drug abuse -Continue telephonic psychology counseling as needed Hepatitis C screening nonreactive, HIV nonreactive on 08/01/2019  Anemia of chronic disease -Weekly CBC  Hypertension BP controlled -Continue metoprolol, will need rx at discharge  Other medications -Continue trazodone, Flexeril for insomnia, will need rx at discharge  Physical debility PT rec outpt PT C/w PT  Fall precautions    MDM and disposition: The below labs and imaging reports reviewed and summarized above.  Medication management as above.   The patient was admitted with MSSA bacteremia, septic pulmonary emboli, large chest wall abscess, right shoulder septic arthritis, and right ankle abscess.    Unfortunately, we have found now new foci of infection that will require adjustment of antibiotics and possibly orthopedic surgery.  Defer duration of antibiotics to ID, appreciate their and Orthopedics        DVT prophylaxis: SCDs for now, start Lovenox post-op Code Status: FULL Family Communication: Mother    Consultants:   CT surgery  Orthopedics  Infectious disease  Neurosurgery  Procedures:   9/2 MRI lumbar spine  9/2 I&D left chest wall abscess with wound vac by Dr. Servando Snare  9/4 I&D right heel abscess at bedside by Hilbert Odor, [facility]  9/11 Excision wound debridemnt, wound vac change by Dr. Prescott Gum  9/14 right shoulder bursa aspiration by Silvestre Gunner, PAC  10/12 MRI pelvis   10/13 MRI RIGHT shoulder  Antimicrobials:   Nafcillin   Culture data:   9/1 urine culture -- MSSA   9/2 blood culture x2 -- MSSA in 2/2  9/2 wound culture -- MSSA  9/2 chest wall abscess intraop culture -- MSSA  9/3 blood culture x2 -- NG  9/4 heel abscess culture -- MSSA  9/6 blood culture x2 -- NG  9/10 blood  culture x2 -- NG  9/14 aspirate shoulder culture -- NG     Objective: Vitals:   09/18/19 1747 09/18/19 1950 09/19/19 1000 09/19/19 1249  BP: (!) 156/89 (!) 142/98 (!) 129/98 133/87  Pulse: 84 83 90 74  Resp: '16 18 18 18  ' Temp: 98.4 F (36.9 C) 98.5 F (36.9 C) 98.1 F (36.7 C) 97.6 F (36.4 C)  TempSrc: Oral Oral Oral Oral  SpO2: 100% 99% 99% 100%  Weight:      Height:       No intake or output data in the 24 hours ending 09/19/19 1302 Filed Weights   09/16/19 0500 09/17/19 0500 09/18/19 0500  Weight: 76.4 kg 78.2 kg 79 kg    Exam:  . General: 38 y.o. year-old male WD WN NAD A&O x 4 . Cardiovascular:RRR no rubs or gallops . Respiratory: CTA no wheezes or rales Abdomen: soft NT ND NBS Musculoskeletal: R shoulder in surgical dressing   Psychiatry: Mood is appropriate for condition and setting.  Data Reviewed: CBC: Recent Labs  Lab 09/13/19 0922 09/19/19 0927  WBC 3.7* 3.9*  HGB 9.8* 9.0*  HCT 29.3* 27.6*  MCV 91.6 90.5  PLT 257 546   Basic Metabolic Panel: Recent Labs  Lab 09/13/19 0922 09/19/19 0927  NA 140 142  K 3.3* 3.4*  CL 105 104  CO2 25 29  GLUCOSE 103* 89  BUN 16 18  CREATININE 0.72 0.72  CALCIUM 8.8*  9.0   GFR: Estimated Creatinine Clearance: 139.9 mL/min (by C-G formula based on SCr of 0.72 mg/dL). Liver Function Tests: No results for input(s): AST, ALT, ALKPHOS, BILITOT, PROT, ALBUMIN in the last 168 hours. No results for input(s): LIPASE, AMYLASE in the last 168 hours. No results for input(s): AMMONIA in the last 168 hours. Coagulation Profile: No results for input(s): INR, PROTIME in the last 168 hours. Cardiac Enzymes: No results for input(s): CKTOTAL, CKMB, CKMBINDEX, TROPONINI in the last 168 hours. BNP (last 3 results) No results for input(s): PROBNP in the last 8760 hours. HbA1C: No results for input(s): HGBA1C in the last 72 hours. CBG: Recent Labs  Lab 09/18/19 1153 09/18/19 1743 09/18/19 2111 09/19/19 0603  09/19/19 1113  GLUCAP 208* 92 129* 138* 194*   Lipid Profile: No results for input(s): CHOL, HDL, LDLCALC, TRIG, CHOLHDL, LDLDIRECT in the last 72 hours. Thyroid Function Tests: No results for input(s): TSH, T4TOTAL, FREET4, T3FREE, THYROIDAB in the last 72 hours. Anemia Panel: No results for input(s): VITAMINB12, FOLATE, FERRITIN, TIBC, IRON, RETICCTPCT in the last 72 hours. Urine analysis:    Component Value Date/Time   COLORURINE YELLOW 08/09/2019 1845   APPEARANCEUR CLEAR 08/09/2019 1845   LABSPEC 1.029 08/09/2019 1845   PHURINE 5.0 08/09/2019 1845   GLUCOSEU 50 (A) 08/09/2019 1845   HGBUR MODERATE (A) 08/09/2019 1845   BILIRUBINUR NEGATIVE 08/09/2019 1845   KETONESUR NEGATIVE 08/09/2019 1845   PROTEINUR 30 (A) 08/09/2019 1845   NITRITE NEGATIVE 08/09/2019 1845   LEUKOCYTESUR NEGATIVE 08/09/2019 1845   Sepsis Labs: '@[email or web address]' (procalcitonin:4,lacticidven:4)  ) Recent Results (from the past 240 hour(s))  Aerobic/Anaerobic Culture (surgical/deep wound)     Status: None   Collection Time: 09/13/19  6:17 PM   Specimen: Soft Tissue, Other  Result Value Ref Range Status   Specimen Description TISSUE  Final   Special Requests RIGHT SUBACROMIAL BURSA ID A  Final   Gram Stain   Final    FEW WBC PRESENT, PREDOMINANTLY PMN NO ORGANISMS SEEN    Culture   Final    No growth aerobically or anaerobically. Performed at [facility], [place] [address], [place], [place] [address]    Report Status 09/19/2019 FINAL  Final  Aerobic/Anaerobic Culture (surgical/deep wound)     Status: None   Collection Time: 09/13/19  6:34 PM   Specimen: Soft Tissue, Other  Result Value Ref Range Status   Specimen Description ABSCESS  Final   Special Requests RIGHT HUMERAL INTRAMEDULLARY CANAL ID B  Final   Gram Stain   Final    RARE WBC PRESENT, PREDOMINANTLY PMN NO ORGANISMS SEEN    Culture   Final    No growth aerobically or anaerobically. Performed at [facility], [place] [address], [place], [place] [address]    Report Status 09/19/2019 FINAL  Final  Aerobic/Anaerobic Culture (surgical/deep wound)     Status: None   Collection Time: 09/13/19  6:47 PM   Specimen: Bone; Tissue  Result Value Ref Range Status   Specimen Description BONE  Final   Special Requests HUMERAL HEAD ID C  Final   Gram Stain   Final    RARE WBC PRESENT, PREDOMINANTLY PMN NO ORGANISMS SEEN    Culture   Final    No growth aerobically or anaerobically. Performed at [facility], [place] [address], [place], [place] [address]    Report Status 09/19/2019 FINAL  Final      Studies: No results found.  Scheduled  Meds: . feeding supplement (ENSURE ENLIVE)  237 mL Oral BID BM  . insulin aspart  0-20 Units Subcutaneous TID WC  . insulin aspart  0-5 Units Subcutaneous QHS  . magnesium oxide  400 mg Oral BID  . Melatonin  6 mg Oral Once  . metoprolol tartrate  12.5 mg Oral BID  . ondansetron  4 mg Intravenous Once  . oxyCODONE  2.5 mg Oral QHS  . polyethylene glycol  17 g Oral Daily  . senna-docusate  2 tablet Oral BID  . sodium chloride flush  10-40 mL Intracatheter Q12H    Continuous Infusions: . sodium chloride 1,000 mL (08/24/19 2247)  .  ceFAZolin (ANCEF) IV Stopped (09/19/19 3406)  . lactated ringers 10 mL/hr at 08/10/19 0845     LOS: 55 days     Kayleen Memos, MD [facility] Pager [phone]  If 7PM-7AM, please contact night-coverage [email or web address] Password [email or web address] 09/19/2019, 1:02 PM

## 2019-09-20 LAB — GLUCOSE, CAPILLARY
Glucose-Capillary: 113 mg/dL — ABNORMAL HIGH (ref 70–99)
Glucose-Capillary: 148 mg/dL — ABNORMAL HIGH (ref 70–99)
Glucose-Capillary: 116 mg/dL — ABNORMAL HIGH (ref 70–99)
Glucose-Capillary: 125 mg/dL — ABNORMAL HIGH (ref 70–99)

## 2019-09-20 NOTE — Progress Notes (Signed)
Chaplain visited William [name] to have an outside visit with him.  William [name] was very appreciative to get outside after being in the hospital for over 50 days.  The chaplain will follow-up and see if this is something that can be done again.  Brion Aliment Chaplain Resident For questions concerning this note please contact me by pager [phone]

## 2019-09-20 NOTE — Progress Notes (Signed)
PROGRESS NOTE  William [name] DXA:[id number] DOB: [birth year] DOA: 07/31/2019 PCP: Patient, No Pcp Per  HPI/Recap of past 24 hours: William [name] is a 38 y.o. M with IVDU who presented with pain in the low back, left shoulder and right foot, as well as flu-like symptoms, progressing over a few days.  In the ER, imaging showed right heel abscess, left upper chest abscess and septic emboli.  Blood cultures obtained, started on empiric antibiotics, and admitted for presumptive endocarditis and for chest wall debridement.     Interim history: 9/2 patient admitted from ER to ICU, underwent debridement of chest wall abscess in the OR, placement of wound vac. MRI lumbar spine showed paraspinal abscess (2x2x4), epidural abscesses, sacral osteomyleitis and lumbar vertebral osteomyelitis/discitis Urine, blood and wound cultures from admission all growing MSSA but thankfully repeat blood cultures 9/3, 9/6, and 9/10 all without growth Ankle abscess I&D'd on 9/4 Transitioned to nafcillin on 9/7 Right shoulder bursa aspirated on 9/11  Patient defervesced and continued on nafcillin MRI pelvis and MRI right shoulder obtained on 10/11-13 showed progression of lumbar/sacral osteo, shrinkage but persistence of paraspinal abscess and newly noted osteomyelitis with abscess of the right humeral head.  Post I&D R shoulder on 09/13/19.  09/19/19: Seen and examined. No new complaints. Labs draw, hypokalemia repleted. Possible malfunction of his picc line. IV team consulted to assess.  09/20/19: Patient was seen and examined at his bedside this morning.  No acute events overnight.  No new complaints.    Assessment/Plan: Principal Problem:   Staphylococcus aureus bacteremia Active Problems:   Sepsis ([place])   Abscess of paraspinous muscles   Osteomyelitis of lumbar spine (HCC)   Abscess in epidural space of lumbar spine   Abnormal chest CT   Septic embolism (HCC)   Community acquired pneumonia   IV  drug abuse ([place])   Heroin abuse ([place])   Elevated troponin   UTI (urinary tract infection)   Renal failure   Hyperglycemia   Suspected endocarditis   Chest wall abscess   Abscess of right heel   Acute osteomyelitis of lumbar spine (HCC)   Paraspinal abscess (HCC)   S/P chest tube placement (HCC)   Acute hematogenous osteomyelitis, right humerus (HCC)  Sepsis from MSSA bacteremia due to IVDA Lumbar osteomyelitis/discitis L2-4 Sacral osteomyelitis Paraspinal and epidural abscess New L4-L5 septic arthritis and progressive L5-S1 discitis/osteomyelitis Checked with IR on 09/11/2019, paraspinal abscess is too small to aspirate, further it is improving with IV nafcillin. -Seen by infectious disease, following -Continue nafcillin for now, defer adjustments to ID -Obtain weekly CBC/BMP, CRP ESR  -Need additional 6 weeks of antibiotics TTE done on 08/01/2019 no report of vegetation Continue IV abx as rec by ID Will obtain labs in the morning.  POD #7 post I&D on 09/13/2019 of newly diagnosed right humeral osteomyelitis with abscess of right proximal humerus/ Subacromial/subdeltoid septic bursitis in the setting of bacteremia MRI done on 09/11/2019 showed extensive right humeral head osteomyelitis, a humeral abscess and septic bursitis again. -Reconsult ID and Orthopedics, appreciate expert cares  Plan to be taken to the OR this afternoon Continue IV antibiotics Continue to optimize pain control Oxycodone 10 mg every 4 hours as needed for moderate to severe pain Control type 2 diabetes with hyperglycemia  Hypokalemia K+ 3.4 repleted with po kcl 40 meq daily x 2 days  Possible malfunction of picc line IV team consulted to assess  Opiate-induced constipation Continue Senokot 2 tablets twice daily and MiraLAX daily Continue to  encourage oral intake and avoid dehydration  Abscess right ankle  Resolved  Chest wall abscess s/p operative I&D with wound vac application 9/2 and 1/68  Septic pulmonary emboli Resolving well  Newly diagnosed type II diabetes with hyperglycemia Glucose 700 at admission.  No previous diagnosis of diabetes.  No family history of diabetes.  Not obese.  A1c here 7.8%. Increase sliding scale to resistant  IV drug abuse -Continue telephonic psychology counseling as needed Hepatitis C screening nonreactive, HIV nonreactive on 08/01/2019  Anemia of chronic disease -Weekly CBC  Hypertension BP controlled -Continue metoprolol, will need rx at discharge  Other medications -Continue trazodone, Flexeril for insomnia, will need rx at discharge  Physical debility PT rec outpt PT C/w PT  Fall precautions    MDM and disposition: The below labs and imaging reports reviewed and summarized above.  Medication management as above.   The patient was admitted with MSSA bacteremia, septic pulmonary emboli, large chest wall abscess, right shoulder septic arthritis, and right ankle abscess.    Unfortunately, we have found now new foci of infection that will require adjustment of antibiotics and possibly orthopedic surgery.  Defer duration of antibiotics to ID, appreciate their and Orthopedics        DVT prophylaxis: SCDs for now, start Lovenox post-op Code Status: FULL Family Communication: Mother    Consultants:   CT surgery  Orthopedics  Infectious disease  Neurosurgery  Procedures:   9/2 MRI lumbar spine  9/2 I&D left chest wall abscess with wound vac by Dr. Servando Snare  9/4 I&D right heel abscess at bedside by Hilbert Odor, [facility]  9/11 Excision wound debridemnt, wound vac change by Dr. Prescott Gum  9/14 right shoulder bursa aspiration by Silvestre Gunner, PAC  10/12 MRI pelvis   10/13 MRI RIGHT shoulder  Antimicrobials:   Nafcillin   Culture data:   9/1 urine culture -- MSSA   9/2 blood culture x2 -- MSSA in 2/2  9/2 wound culture -- MSSA  9/2 chest wall abscess intraop culture -- MSSA   9/3 blood culture x2 -- NG  9/4 heel abscess culture -- MSSA  9/6 blood culture x2 -- NG  9/10 blood culture x2 -- NG  9/14 aspirate shoulder culture -- NG     Objective: Vitals:   09/19/19 1703 09/19/19 1952 09/20/19 0841 09/20/19 1110  BP: 129/86 (!) 140/91 (!) 147/95 (!) 147/93  Pulse: 96 95 (!) 107 79  Resp: _0 Temp: 97.9 F (36.6 C) 98.3 F (36.8 C) 97.6 F (36.4 C) 97.9 F (36.6 C)  TempSrc: Oral Oral Oral Oral  SpO2: 100% 100% 98% 100%  Weight:      Height:       No intake or output data in the 24 hours ending 09/20/19 1432 Filed Weights   09/16/19 0500 09/17/19 0500 09/18/19 0500  Weight: 76.4 kg 78.2 kg 79 kg    Exam:  . General: 38 y.o. year-old male well-developed well-nourished in no acute distress.  Alert and oriented x4. . Cardiovascular: Regular rate and rhythm no rubs or gallops no JVD or thyromegaly noted.   Marland Kitchen Respiratory: Clear to auscultation no wheezes no rales. Abdomen: Soft nontender nondistended with bowel sounds present. Musculoskeletal: Right shoulder in surgical dressing. Psychiatry: Mood is appropriate for condition and setting.  Data Reviewed: CBC: Recent Labs  Lab 09/19/19 0927  WBC 3.9*  HGB 9.0*  HCT 27.6*  MCV 90.5  PLT 372   Basic Metabolic Panel: Recent Labs  Lab 09/19/19 0927  NA 142  K 3.4*  CL 104  CO2 29  GLUCOSE 89  BUN 18  CREATININE 0.72  CALCIUM 9.0   GFR: Estimated Creatinine Clearance: 139.9 mL/min (by C-G formula based on SCr of 0.72 mg/dL). Liver Function Tests: No results for input(s): AST, ALT, ALKPHOS, BILITOT, PROT, ALBUMIN in the last 168 hours. No results for input(s): LIPASE, AMYLASE in the last 168 hours. No results for input(s): AMMONIA in the last 168 hours. Coagulation Profile: No results for input(s): INR, PROTIME in the last 168 hours. Cardiac Enzymes: No results for input(s): CKTOTAL, CKMB, CKMBINDEX, TROPONINI in the last 168 hours. BNP (last 3 results) No results  for input(s): PROBNP in the last 8760 hours. HbA1C: No results for input(s): HGBA1C in the last 72 hours. CBG: Recent Labs  Lab 09/19/19 1113 09/19/19 1743 09/19/19 2119 09/20/19 0606 09/20/19 1112  GLUCAP 194* 118* 175* 113* 148*   Lipid Profile: No results for input(s): CHOL, HDL, LDLCALC, TRIG, CHOLHDL, LDLDIRECT in the last 72 hours. Thyroid Function Tests: No results for input(s): TSH, T4TOTAL, FREET4, T3FREE, THYROIDAB in the last 72 hours. Anemia Panel: No results for input(s): VITAMINB12, FOLATE, FERRITIN, TIBC, IRON, RETICCTPCT in the last 72 hours. Urine analysis:    Component Value Date/Time   COLORURINE YELLOW 08/09/2019 1845   APPEARANCEUR CLEAR 08/09/2019 1845   LABSPEC 1.029 08/09/2019 1845   PHURINE 5.0 08/09/2019 1845   GLUCOSEU 50 (A) 08/09/2019 1845   HGBUR MODERATE (A) 08/09/2019 1845   BILIRUBINUR NEGATIVE 08/09/2019 1845   KETONESUR NEGATIVE 08/09/2019 1845   PROTEINUR 30 (A) 08/09/2019 1845   NITRITE NEGATIVE 08/09/2019 1845   LEUKOCYTESUR NEGATIVE 08/09/2019 1845   Sepsis Labs: _0 (procalcitonin:4,lacticidven:4)  ) Recent Results (from the past 240 hour(s))  Aerobic/Anaerobic Culture (surgical/deep wound)     Status: None   Collection Time: 09/13/19  6:17 PM   Specimen: Soft Tissue, Other  Result Value Ref Range Status   Specimen Description TISSUE  Final   Special Requests RIGHT SUBACROMIAL BURSA ID A  Final   Gram Stain   Final    FEW WBC PRESENT, PREDOMINANTLY PMN NO ORGANISMS SEEN    Culture   Final    No growth aerobically or anaerobically. Performed at [facility], [place] [address], [place], [place] [address]    Report Status 09/19/2019 FINAL  Final  Aerobic/Anaerobic Culture (surgical/deep wound)     Status: None   Collection Time: 09/13/19  6:34 PM   Specimen: Soft Tissue, Other  Result Value Ref Range Status   Specimen Description ABSCESS  Final   Special Requests RIGHT HUMERAL INTRAMEDULLARY CANAL ID B  Final    Gram Stain   Final    RARE WBC PRESENT, PREDOMINANTLY PMN NO ORGANISMS SEEN    Culture   Final    No growth aerobically or anaerobically. Performed at [facility], [place] [address] [place],  [address]    Report Status 09/19/2019 FINAL  Final  Aerobic/Anaerobic Culture (surgical/deep wound)     Status: None   Collection Time: 09/13/19  6:47 PM   Specimen: Bone; Tissue  Result Value Ref Range Status   Specimen Description BONE  Final   Special Requests HUMERAL HEAD ID C  Final   Gram Stain   Final    RARE WBC PRESENT, PREDOMINANTLY PMN NO ORGANISMS SEEN    Culture   Final    No growth aerobically or anaerobically. Performed at [facility], [address] [place], [place] [address]    Report Status 09/19/2019 FINAL  Final      Studies: No results found.  Scheduled Meds: . enoxaparin (LOVENOX) injection  40 mg Subcutaneous Q24H  . feeding supplement (ENSURE ENLIVE)  237 mL Oral BID BM  . insulin aspart  0-20 Units Subcutaneous TID WC  . insulin aspart  0-5 Units Subcutaneous QHS  . magnesium oxide  400 mg Oral BID  . Melatonin  6 mg Oral Once  . metoprolol tartrate  12.5 mg Oral BID  . ondansetron  4 mg Intravenous Once  . oxyCODONE  2.5 mg Oral QHS  . polyethylene glycol  17 g Oral Daily  . senna-docusate  2 tablet Oral BID  . sodium chloride flush  10-40 mL Intracatheter Q12H    Continuous Infusions: . sodium chloride 1,000 mL (08/24/19 2247)  .  ceFAZolin (ANCEF) IV 2 g (09/20/19 0549)  . lactated ringers 10 mL/hr at 08/10/19 0845     LOS: 44 days     Kayleen Memos, MD [facility] Pager [phone]  If 7PM-7AM, please contact night-coverage [email or web address] Password [facility] 09/20/2019, 2:32 PM

## 2019-09-21 LAB — GLUCOSE, CAPILLARY
Glucose-Capillary: 124 mg/dL — ABNORMAL HIGH (ref 70–99)
Glucose-Capillary: 157 mg/dL — ABNORMAL HIGH (ref 70–99)
Glucose-Capillary: 157 mg/dL — ABNORMAL HIGH (ref 70–99)
Glucose-Capillary: 172 mg/dL — ABNORMAL HIGH (ref 70–99)

## 2019-09-21 NOTE — Progress Notes (Signed)
PROGRESS NOTE  LANORRIS KALISZ MIW:[id number] DOB: [birth year] DOA: 07/31/2019 PCP: Patient, No Pcp Per  HPI/Recap of past 24 hours: Mr. Sterkel is a 38 y.o. M with IVDU who presented with pain in the low back, left shoulder and right foot, as well as flu-like symptoms, progressing over a few days.  In the ER, imaging showed right heel abscess, left upper chest abscess and septic emboli.  Blood cultures obtained, started on empiric antibiotics, and admitted for presumptive endocarditis and for chest wall debridement.     Interim history: 9/2 patient admitted from ER to ICU, underwent debridement of chest wall abscess in the OR, placement of wound vac. MRI lumbar spine showed paraspinal abscess (2x2x4), epidural abscesses, sacral osteomyleitis and lumbar vertebral osteomyelitis/discitis Urine, blood and wound cultures from admission all growing MSSA but thankfully repeat blood cultures 9/3, 9/6, and 9/10 all without growth Ankle abscess I&D'd on 9/4 Transitioned to nafcillin on 9/7 Right shoulder bursa aspirated on 9/11  Patient defervesced and continued on nafcillin MRI pelvis and MRI right shoulder obtained on 10/11-13 showed progression of lumbar/sacral osteo, shrinkage but persistence of paraspinal abscess and newly noted osteomyelitis with abscess of the right humeral head.  Post I&D R shoulder on 09/13/19.  09/21/19: Patient was seen and examined at his bedside this morning.  No acute events overnight. Cefazolin infusion start date 09/12/19;  end date 10/03/19.   Assessment/Plan: Principal Problem:   Staphylococcus aureus bacteremia Active Problems:   Sepsis ([place])   Abscess of paraspinous muscles   Osteomyelitis of lumbar spine (HCC)   Abscess in epidural space of lumbar spine   Abnormal chest CT   Septic embolism (HCC)   Community acquired pneumonia   IV drug abuse ([place])   Heroin abuse ([place])   Elevated troponin   UTI (urinary tract infection)   Renal failure  Hyperglycemia   Suspected endocarditis   Chest wall abscess   Abscess of right heel   Acute osteomyelitis of lumbar spine (HCC)   Paraspinal abscess (HCC)   S/P chest tube placement (HCC)   Acute hematogenous osteomyelitis, right humerus (HCC)  Sepsis from MSSA bacteremia due to IVDA Lumbar osteomyelitis/discitis L2-4 Sacral osteomyelitis Paraspinal and epidural abscess New L4-L5 septic arthritis and progressive L5-S1 discitis/osteomyelitis Checked with IR on 09/11/2019, paraspinal abscess is too small to aspirate, further it is improving with IV nafcillin. -Seen by infectious disease, following -Continue nafcillin for now, defer adjustments to ID -Obtain weekly CBC/BMP, CRP ESR  -Need additional 6 weeks of antibiotics TTE done on 08/01/2019 no report of vegetation Continue IV abx as rec by ID Cefazolin start date 10/14- end date 10/03/19  POD #8 post I&D on 09/13/2019 of newly diagnosed right humeral osteomyelitis with abscess of right proximal humerus/ Subacromial/subdeltoid septic bursitis in the setting of bacteremia MRI done on 09/11/2019 showed extensive right humeral head osteomyelitis, a humeral abscess and septic bursitis again. -Reconsulted ID and Orthopedics, appreciate expert cares  Plan to be taken to the OR this afternoon Continue IV antibiotics Continue to optimize pain control Oxycodone 5 mg every 4 hours as needed for moderate to severe pain Control type 2 diabetes with hyperglycemia  Hypokalemia K+ 3.4 repleted with po kcl 40 meq daily x 2 days  Possible malfunction of picc line IV team consulted to assess  Opiate-induced constipation Continue Senokot 2 tablets twice daily and MiraLAX daily Continue to encourage oral intake and avoid dehydration  Abscess right ankle  Resolved  Chest wall abscess s/p operative I&D with  wound vac application 9/2 and 1/60 Septic pulmonary emboli Resolving well  Newly diagnosed type II diabetes with hyperglycemia  Glucose 700 at admission.  No previous diagnosis of diabetes.  No family history of diabetes.  Not obese.  A1c here 7.8%. Increase sliding scale to resistant  IV drug abuse -Continue telephonic psychology counseling as needed Hepatitis C screening nonreactive, HIV nonreactive on 08/01/2019  Anemia of chronic disease -Weekly CBC  Hypertension BP controlled -Continue metoprolol, will need rx at discharge  Other medications -Continue trazodone, Flexeril for insomnia, will need rx at discharge  Physical debility PT rec outpt PT C/w PT  Fall precautions    MDM and disposition: The below labs and imaging reports reviewed and summarized above.  Medication management as above.   The patient was admitted with MSSA bacteremia, septic pulmonary emboli, large chest wall abscess, right shoulder septic arthritis, and right ankle abscess.    Unfortunately, we have found now new foci of infection that will require adjustment of antibiotics and possibly orthopedic surgery.  Defer duration of antibiotics to ID, appreciate their and Orthopedics        DVT prophylaxis: SCDs for now, start Lovenox post-op Code Status: FULL Family Communication: Mother    Consultants:   CT surgery  Orthopedics  Infectious disease  Neurosurgery  Procedures:   9/2 MRI lumbar spine  9/2 I&D left chest wall abscess with wound vac by Dr. Servando Snare  9/4 I&D right heel abscess at bedside by Hilbert Odor, [facility]  9/11 Excision wound debridemnt, wound vac change by Dr. Prescott Gum  9/14 right shoulder bursa aspiration by Silvestre Gunner, PAC  10/12 MRI pelvis   10/13 MRI RIGHT shoulder  Antimicrobials:   Nafcillin   Culture data:   9/1 urine culture -- MSSA   9/2 blood culture x2 -- MSSA in 2/2  9/2 wound culture -- MSSA  9/2 chest wall abscess intraop culture -- MSSA  9/3 blood culture x2 -- NG  9/4 heel abscess culture -- MSSA  9/6 blood culture x2 -- NG   9/10 blood culture x2 -- NG  9/14 aspirate shoulder culture -- NG     Objective: Vitals:   09/21/19 0500 09/21/19 0628 09/21/19 0751 09/21/19 1212  BP:  139/90 (!) 146/99 130/73  Pulse:  73 79 75  Resp:  '17 17 17  ' Temp:  98 F (36.7 C) (!) 97 F (36.1 C) (!) 97.5 F (36.4 C)  TempSrc:  Oral Oral Oral  SpO2:  99% 100% 100%  Weight: 79 kg     Height:        Intake/Output Summary (Last 24 hours) at 09/21/2019 1257 Last data filed at 09/21/2019 1093 Gross per 24 hour  Intake 550 ml  Output 500 ml  Net 50 ml   Filed Weights   09/17/19 0500 09/18/19 0500 09/21/19 0500  Weight: 78.2 kg 79 kg 79 kg    Exam:  . General: 38 y.o. year-old male well-developed well-nourished no acute distress.  Alert and oriented x4. .  Cardiovascular: Regular rate and rhythm no rubs or gallops no JVD or thyromegaly . Respiratory: Clear to Auscultation No Wheezes No Rales. Abdomen: Soft nontender nondistended normal bowel sounds present. Musculoskeletal: Right shoulder wound with staples healing well.  No drainage or erythema noted. Psychiatry: Mood is appropriate for condition and setting.  Data Reviewed: CBC: Recent Labs  Lab 09/19/19 0927  WBC 3.9*  HGB 9.0*  HCT 27.6*  MCV 90.5  PLT 235   Basic Metabolic Panel:  Recent Labs  Lab 09/19/19 0927  NA 142  K 3.4*  CL 104  CO2 29  GLUCOSE 89  BUN 18  CREATININE 0.72  CALCIUM 9.0   GFR: Estimated Creatinine Clearance: 139.9 mL/min (by C-G formula based on SCr of 0.72 mg/dL). Liver Function Tests: No results for input(s): AST, ALT, ALKPHOS, BILITOT, PROT, ALBUMIN in the last 168 hours. No results for input(s): LIPASE, AMYLASE in the last 168 hours. No results for input(s): AMMONIA in the last 168 hours. Coagulation Profile: No results for input(s): INR, PROTIME in the last 168 hours. Cardiac Enzymes: No results for input(s): CKTOTAL, CKMB, CKMBINDEX, TROPONINI in the last 168 hours. BNP (last 3 results) No results for  input(s): PROBNP in the last 8760 hours. HbA1C: No results for input(s): HGBA1C in the last 72 hours. CBG: Recent Labs  Lab 09/20/19 1112 09/20/19 1609 09/20/19 2109 09/21/19 0625 09/21/19 1116  GLUCAP 148* 116* 125* 124* 157*   Lipid Profile: No results for input(s): CHOL, HDL, LDLCALC, TRIG, CHOLHDL, LDLDIRECT in the last 72 hours. Thyroid Function Tests: No results for input(s): TSH, T4TOTAL, FREET4, T3FREE, THYROIDAB in the last 72 hours. Anemia Panel: No results for input(s): VITAMINB12, FOLATE, FERRITIN, TIBC, IRON, RETICCTPCT in the last 72 hours. Urine analysis:    Component Value Date/Time   COLORURINE YELLOW 08/09/2019 1845   APPEARANCEUR CLEAR 08/09/2019 1845   LABSPEC 1.029 08/09/2019 1845   PHURINE 5.0 08/09/2019 1845   GLUCOSEU 50 (A) 08/09/2019 1845   HGBUR MODERATE (A) 08/09/2019 1845   BILIRUBINUR NEGATIVE 08/09/2019 1845   KETONESUR NEGATIVE 08/09/2019 1845   PROTEINUR 30 (A) 08/09/2019 1845   NITRITE NEGATIVE 08/09/2019 1845   LEUKOCYTESUR NEGATIVE 08/09/2019 1845   Sepsis Labs: '@[email or web address]' (procalcitonin:4,lacticidven:4)  ) Recent Results (from the past 240 hour(s))  Aerobic/Anaerobic Culture (surgical/deep wound)     Status: None   Collection Time: 09/13/19  6:17 PM   Specimen: Soft Tissue, Other  Result Value Ref Range Status   Specimen Description TISSUE  Final   Special Requests RIGHT SUBACROMIAL BURSA ID A  Final   Gram Stain   Final    FEW WBC PRESENT, PREDOMINANTLY PMN NO ORGANISMS SEEN    Culture   Final    No growth aerobically or anaerobically. Performed at [facility], [place] [address] [place], [place] [address]    Report Status 09/19/2019 FINAL  Final  Aerobic/Anaerobic Culture (surgical/deep wound)     Status: None   Collection Time: 09/13/19  6:34 PM   Specimen: Soft Tissue, Other  Result Value Ref Range Status   Specimen Description ABSCESS  Final   Special Requests RIGHT HUMERAL INTRAMEDULLARY CANAL ID B  Final   Gram  Stain   Final    RARE WBC PRESENT, PREDOMINANTLY PMN NO ORGANISMS SEEN    Culture   Final    No growth aerobically or anaerobically. Performed at [facility], [place] [address], [place], [place] [address]    Report Status 09/19/2019 FINAL  Final  Aerobic/Anaerobic Culture (surgical/deep wound)     Status: None   Collection Time: 09/13/19  6:47 PM   Specimen: Bone; Tissue  Result Value Ref Range Status   Specimen Description BONE  Final   Special Requests HUMERAL HEAD ID C  Final   Gram Stain   Final    RARE WBC PRESENT, PREDOMINANTLY PMN NO ORGANISMS SEEN    Culture   Final    No growth aerobically or anaerobically. Performed at [facility], [place] [address] [place], [place] [address]    Report Status 09/19/2019 FINAL  Final      Studies: No results found.  Scheduled Meds: . enoxaparin (LOVENOX) injection  40 mg Subcutaneous Q24H  . feeding supplement (ENSURE ENLIVE)  237 mL Oral BID BM  . insulin aspart  0-20 Units Subcutaneous TID WC  . insulin aspart  0-5 Units Subcutaneous QHS  . magnesium oxide  400 mg Oral BID  . Melatonin  6 mg Oral Once  . metoprolol tartrate  12.5 mg Oral BID  . ondansetron  4 mg Intravenous Once  . polyethylene glycol  17 g Oral Daily  . senna-docusate  2 tablet Oral BID  . sodium chloride flush  10-40 mL Intracatheter Q12H    Continuous Infusions: . sodium chloride 1,000 mL (08/24/19 2247)  .  ceFAZolin (ANCEF) IV 2 g (09/21/19 0708)  . lactated ringers 10 mL/hr at 08/10/19 0845     LOS: 80 days     Kayleen Memos, MD [facility] Pager [phone]  If 7PM-7AM, please contact night-coverage [email or web address] Password [facility] 09/21/2019, 12:57 PM

## 2019-09-21 NOTE — Progress Notes (Signed)
Occupational Therapy Treatment Patient Details Name: William [name] MRN: [id number] DOB: Oct 09, 1981 Today's Date: 09/21/2019    History of present illness Patient is a 38 y/o male who presents with lower back pain, left shoulder pain, right foot and heel pain. Found to have right heel abscess s/p I&D 9/4, left upper chest wall abscess and mass s/p I&D and wound vac application 9/3, new onset DM, sepsis with MSSA bacteremia due to IV drug use. CT scan- left anterior chest wall soft tissue and intramuscular infection. Right subdeltoid, subacromial septic bursitis, proximal humerus osteomyelitis with abscess in humeral shaft s/p excision of bursa, saucerization of proximal humerus and humeral shaft: DOS: 09/13/2019. PMH- heroin IV drug use.    OT comments  Pt continues to progress with RUE HEP and with fair carryover skills as pt forgot a few exercises located on 2nd page of handout. Pt increasing range with shoulder flexion and PROM/AAROM. Pt continues to benefit from continued OT skilled services for RUE HEP and continuation of ADL tasks with RUE. OT following acutely.    Follow Up Recommendations  Outpatient OT    Equipment Recommendations  None recommended by OT    Recommendations for Other Services      Precautions / Restrictions Precautions Precautions: Fall Precaution Comments: shoulder surgery on 10/15 Restrictions Weight Bearing Restrictions: Yes RUE Weight Bearing: Non weight bearing       Mobility Bed Mobility Overal bed mobility: Needs Assistance             General bed mobility comments: in recliner  Transfers Overall transfer level: Modified independent Equipment used: None Transfers: Sit to/from Stand           General transfer comment: Pt stood from rolling computer chair with no issues    Balance Overall balance assessment: Needs assistance   Sitting balance-Leahy Scale: Normal       Standing balance-Leahy Scale: Normal                              ADL either performed or assessed with clinical judgement   ADL Overall ADL's : Needs assistance/impaired                                     Functional mobility during ADLs: Supervision/safety;Rolling walker General ADL Comments: Pt performing RUE HEP     Vision   Vision Assessment?: No apparent visual deficits   Perception     Praxis      Cognition Arousal/Alertness: Awake/alert Behavior During Therapy: WFL for tasks assessed/performed Overall Cognitive Status: Difficult to assess Area of Impairment: Problem solving;Safety/judgement                         Safety/Judgement: Decreased awareness of safety;Decreased awareness of deficits   Problem Solving: Requires verbal cues General Comments: Pt continues to require cues for NWB RUE so pt had been using RUE to push off from. Pt made of aware of precautions and pt abiding by them for remainder of session.        Exercises General Exercises - Upper Extremity Shoulder Flexion: PROM;10 reps;Right Shoulder ABduction: PROM;10 reps;Supine Other Exercises Other Exercises: PROM shoulder external rotation- 10 reps Other Exercises: Shoulder IR/ER AAROM x10 reps Other Exercises: Shoulder extension x10 reps, AROM   Shoulder Instructions       General Comments Pt  went over RUE HEP and pt had forgotten the 2nd page of exercises so OT went over all exercises.    Pertinent Vitals/ Pain       Pain Assessment: 0-10 Pain Score: 2  Pain Location: R shoulder Pain Descriptors / Indicators: Aching Pain Intervention(s): Repositioned  Home Living                                          Prior Functioning/Environment              Frequency  Min 2X/week        Progress Toward Goals  OT Goals(current goals can now be found in the care plan section)  Progress towards OT goals: Progressing toward goals  Acute Rehab OT Goals Patient Stated Goal: go home OT Goal  Formulation: With patient Time For Goal Achievement: 10/04/19 Potential to Achieve Goals: Good ADL Goals Pt/caregiver will Perform Home Exercise Program: Right Upper extremity;Increased strength;Increased ROM;With written HEP provided Additional ADL Goal #1: Pt will increase to independence with OOB ADL with RUE pain <2/10.  Plan Discharge plan remains appropriate    Co-evaluation                 AM-PAC OT "6 Clicks" Daily Activity     Outcome Measure   Help from another person eating meals?: None Help from another person taking care of personal grooming?: None Help from another person toileting, which includes using toliet, bedpan, or urinal?: None Help from another person bathing (including washing, rinsing, drying)?: A Little Help from another person to put on and taking off regular upper body clothing?: None Help from another person to put on and taking off regular lower body clothing?: None 6 Click Score: 23    End of Session Equipment Utilized During Treatment: Gait belt  OT Visit Diagnosis: Unsteadiness on feet (R26.81);Muscle weakness (generalized) (M62.81)   Activity Tolerance Patient tolerated treatment well   Patient Left in chair;with call bell/phone within reach   Nurse Communication Mobility status        Time: 1212-1229 OT Time Calculation (min): 17 min  Charges: OT General Charges $OT Visit: 1 Visit OT Treatments $Therapeutic Exercise: 8-22 mins   William [name] Acute Rehabilitation Services Pager: [phone] Office: [phone]    Lonzo Cloud 09/21/2019, 4:39 PM

## 2019-09-21 NOTE — Plan of Care (Signed)
Patient is progressing towards plan of care goals. 

## 2019-09-21 NOTE — Consult Note (Addendum)
Subjective: Pt seen at the bedside this AM.  Mother was present today.  Patient says his left shoulder pain is much better today.  His range of motion is also improving.  No complaints at this time.  Antibiotics:  Anti-infectives (From admission, onward)   Start     Dose/Rate Route Frequency Ordered Stop   09/13/19 1813  gentamicin (GARAMYCIN) injection  Status:  Discontinued       As needed 09/13/19 1814 09/13/19 1923   09/13/19 1811  vancomycin (VANCOCIN) powder  Status:  Discontinued       As needed 09/13/19 1812 09/13/19 1923   09/13/19 1732  polymyxin B 500,000 Units, bacitracin 50,000 Units in sodium chloride 0.9 % 500 mL irrigation  Status:  Discontinued       As needed 09/13/19 1733 09/13/19 1923   09/13/19 0945  ceFAZolin (ANCEF) IVPB 2g/100 mL premix     2 g 200 mL/hr over 30 Minutes Intravenous On call to O.R. 09/13/19 0938 09/13/19 1800   09/12/19 1500  ceFAZolin (ANCEF) IVPB 2g/100 mL premix     2 g 200 mL/hr over 30 Minutes Intravenous Every 8 hours 09/12/19 1416     08/10/19 0936  vancomycin (VANCOCIN) 1,000 mg in sodium chloride 0.9 % 1,000 mL irrigation  Status:  Discontinued       As needed 08/10/19 0937 08/10/19 1121   08/10/19 0600  ceFAZolin (ANCEF) IVPB 2g/100 mL premix     2 g 200 mL/hr over 30 Minutes Intravenous To Short Stay 08/10/19 0036 08/10/19 0625   08/09/19 1730  ceFAZolin (ANCEF) IVPB 2g/100 mL premix  Status:  Discontinued     2 g 200 mL/hr over 30 Minutes Intravenous 30 min pre-op 08/09/19 1730 08/10/19 0036   08/06/19 1430  nafcillin injection 2 g  Status:  Discontinued     2 g Intravenous Every 4 hours 08/06/19 1310 08/06/19 1359   08/06/19 1430  nafcillin 2 g in sodium chloride 0.9 % 100 mL IVPB  Status:  Discontinued     2 g 200 mL/hr over 30 Minutes Intravenous Every 4 hours 08/06/19 1359 09/12/19 1416   08/03/19 0900  fluconazole (DIFLUCAN) tablet 200 mg     200 mg Oral  Once 08/03/19 0842 08/03/19 0917   08/02/19 2200  ceFAZolin (ANCEF)  IVPB 2g/100 mL premix  Status:  Discontinued     2 g 200 mL/hr over 30 Minutes Intravenous Every 8 hours 08/02/19 1534 08/06/19 1310   08/02/19 1600  metroNIDAZOLE (FLAGYL) tablet 500 mg  Status:  Discontinued     500 mg Oral Every 8 hours 08/02/19 1534 08/06/19 1454   08/01/19 1200  vancomycin (VANCOCIN) IVPB 750 mg/150 ml premix  Status:  Discontinued     750 mg 150 mL/hr over 60 Minutes Intravenous Every 8 hours 08/01/19 0224 08/02/19 1533   08/01/19 1130  ceFAZolin (ANCEF) IVPB 2g/100 mL premix     2 g 200 mL/hr over 30 Minutes Intravenous 30 min pre-op 08/01/19 0828 08/01/19 1319   08/01/19 1000  ceFEPIme (MAXIPIME) 2 g in sodium chloride 0.9 % 100 mL IVPB  Status:  Discontinued     2 g 200 mL/hr over 30 Minutes Intravenous Every 8 hours 08/01/19 0224 08/02/19 1533   08/01/19 0100  ceFEPIme (MAXIPIME) 2 g in sodium chloride 0.9 % 100 mL IVPB     2 g 200 mL/hr over 30 Minutes Intravenous  Once 08/01/19 0056 08/01/19 0335   08/01/19 0100  metroNIDAZOLE (FLAGYL)  IVPB 500 mg     500 mg 100 mL/hr over 60 Minutes Intravenous  Once 08/01/19 0056 08/01/19 0335   08/01/19 0100  vancomycin (VANCOCIN) IVPB 1000 mg/200 mL premix     1,000 mg 200 mL/hr over 60 Minutes Intravenous  Once 08/01/19 0056 08/01/19 0335     Medications: Scheduled Meds: . enoxaparin (LOVENOX) injection  40 mg Subcutaneous Q24H  . feeding supplement (ENSURE ENLIVE)  237 mL Oral BID BM  . insulin aspart  0-20 Units Subcutaneous TID WC  . insulin aspart  0-5 Units Subcutaneous QHS  . magnesium oxide  400 mg Oral BID  . Melatonin  6 mg Oral Once  . metoprolol tartrate  12.5 mg Oral BID  . ondansetron  4 mg Intravenous Once  . polyethylene glycol  17 g Oral Daily  . senna-docusate  2 tablet Oral BID  . sodium chloride flush  10-40 mL Intracatheter Q12H   Continuous Infusions: . sodium chloride 1,000 mL (08/24/19 2247)  .  ceFAZolin (ANCEF) IV 2 g (09/21/19 0708)  . lactated ringers 10 mL/hr at 08/10/19 0845    PRN Meds:.sodium chloride, acetaminophen, calcium carbonate, cyclobenzaprine, ibuprofen, ondansetron (ZOFRAN) IV, oxyCODONE, promethazine, sodium chloride flush, traZODone   Objective: Weight change:   Intake/Output Summary (Last 24 hours) at 09/21/2019 1248 Last data filed at 09/21/2019 0752 Gross per 24 hour  Intake 550 ml  Output 500 ml  Net 50 ml   Blood pressure 130/73, pulse 75, temperature (!) 97.5 F (36.4 C), temperature source Oral, resp. rate 17, height 6' 2.02" (1.88 m), weight 79 kg, SpO2 100 %. Temp:  [97 F (36.1 C)-98.4 F (36.9 C)] 97.5 F (36.4 C) (10/23 1212) Pulse Rate:  [69-86] 75 (10/23 1212) Resp:  [17-19] 17 (10/23 1212) BP: (125-146)/(73-99) 130/73 (10/23 1212) SpO2:  [99 %-100 %] 100 % (10/23 1212) Weight:  [79 kg] 79 kg (10/23 0500)  Physical Exam: General: Sitting up in chair on the computer.  NAD HEENT: NCAT, EOMI CV: RRR, normal S1 and S2 no murmurs rubs or gallops appreciated PULM: Clear to auscultation bilaterally in all lung fields Abdominal: Soft nontender MSK: Limited ROM in right shoulder, scar healing is dry and intact NERUO: Alert and oriented, no focal deficits  CBC:    Component Value Date/Time   WBC 3.9 (L) 09/19/2019 0927   RBC 3.05 (L) 09/19/2019 0927   HGB 9.0 (L) 09/19/2019 0927   HCT 27.6 (L) 09/19/2019 0927   PLT 266 09/19/2019 0927   MCV 90.5 09/19/2019 0927   MCH 29.5 09/19/2019 0927   MCHC 32.6 09/19/2019 0927   RDW 14.6 09/19/2019 0927   LYMPHSABS 0.7 08/31/2019 1730   MONOABS 0.3 08/31/2019 1730   EOSABS 0.1 08/31/2019 1730   BASOSABS 0.0 08/31/2019 1730   Micro Results: Recent Results (from the past 720 hour(s))  Aerobic/Anaerobic Culture (surgical/deep wound)     Status: None   Collection Time: 09/13/19  6:17 PM   Specimen: Soft Tissue, Other  Result Value Ref Range Status   Specimen Description TISSUE  Final   Special Requests RIGHT SUBACROMIAL BURSA ID A  Final   Gram Stain   Final    FEW WBC PRESENT,  PREDOMINANTLY PMN NO ORGANISMS SEEN    Culture   Final    No growth aerobically or anaerobically. Performed at [facility], [address], [place], [place] [address]    Report Status 09/19/2019 FINAL  Final  Aerobic/Anaerobic Culture (surgical/deep wound)  Status: None   Collection Time: 09/13/19  6:34 PM   Specimen: Soft Tissue, Other  Result Value Ref Range Status   Specimen Description ABSCESS  Final   Special Requests RIGHT HUMERAL INTRAMEDULLARY CANAL ID B  Final   Gram Stain   Final    RARE WBC PRESENT, PREDOMINANTLY PMN NO ORGANISMS SEEN    Culture   Final    No growth aerobically or anaerobically. Performed at [facility], [address] [place], [place] [address]    Report Status 09/19/2019 FINAL  Final  Aerobic/Anaerobic Culture (surgical/deep wound)     Status: None   Collection Time: 09/13/19  6:47 PM   Specimen: Bone; Tissue  Result Value Ref Range Status   Specimen Description BONE  Final   Special Requests HUMERAL HEAD ID C  Final   Gram Stain   Final    RARE WBC PRESENT, PREDOMINANTLY PMN NO ORGANISMS SEEN    Culture   Final    No growth aerobically or anaerobically. Performed at [facility], [address] [place], [place] [address]    Report Status 09/19/2019 FINAL  Final   Studies/Results: No results found. Assessment/Plan:  Principal Problem:   Staphylococcus aureus bacteremia Active Problems:   Sepsis (HCC)   Abscess of paraspinous muscles   Osteomyelitis of lumbar spine (HCC)   Abscess in epidural space of lumbar spine   Abnormal chest CT   Septic embolism (HCC)   Community acquired pneumonia   IV drug abuse (HCC)   Heroin abuse (HCC)   Elevated troponin   UTI (urinary tract infection)   Renal failure   Hyperglycemia   Suspected endocarditis   Chest wall abscess   Abscess of right heel   Acute osteomyelitis of lumbar spine (HCC)   Paraspinal abscess (HCC)   S/P chest tube placement (HCC)   Acute  hematogenous osteomyelitis, right humerus (HCC)  In summary, Mr. Phineas RealMabe is a 38 year old gentleman with past medical history significant for IV drug abuse who presented with disseminated MSSA infection. He has several abscesses located in the chest wall (s/p I&D w/ wound vac), lumbar paraspinal and epidural abcesses, sacral osteomyelitis and lumbar vertebral osteomyelitis/discitisosteomyelitis and most recently R humerus osteomyelitis.    Plan: 1. Pt would likley benefit from PT for his R shoulder 2.    [facility] Staphylococcus aureus bacteremia   Staphylococcus aureus bacteremia (SAB) is associated with a high rate of complications and mortality.  Specific aspects of clinical management are critical to optimizing the outcome of patients with SAB.  Therefore, the [facility]) has initiated an intervention aimed at improving the management of SAB at [facility].  To do so, Infectious Diseases physicians are providing an evidence-based consult for the management of all patients with SAB.     Yes No Comments  Perform follow-up blood cultures (even if the patient is afebrile) to ensure clearance of bacteremia  Repeat blood cultures on 9/3, 9/6 and 9/10 have been negative.  Surgical cultures have remained negative as well.  [x]  []    Remove vascular catheter and obtain follow-up blood cultures after the removal of the catheter [x]  []    Perform echocardiography to evaluate for endocarditis (transthoracic ECHO is 40-50% sensitive, TEE is > 90% sensitive)  TEE completed on 9/02 demonstrated no evidence of valve vegetations suggesting endocarditis  Repeat blood cultures on 9/3, 9/6 and 9/10 have been negative [x]  []  Please keep in mind, that neither  test can definitively EXCLUDE endocarditis, and that should clinical suspicion remain high for endocarditis the patient should then still be treated with an "endocarditis" duration of therapy  = 6 weeks  Consult electrophysiologist to evaluate implanted cardiac device (pacemaker, ICD) []  []    Ensure source control TEE completed on 9/02 demonstrated no evidence of valve vegetations suggesting endocarditis [x]  []  Have all abscesses been drained effectively? Have deep seeded infections (septic joints or osteomyelitis) had appropriate surgical debridement?  Investigate for "metastatic" sites of infection [x]  []  Does the patient have ANY symptom or physical exam finding that would suggest a deeper infection (back or neck pain that may be suggestive of vertebral osteomyelitis or epidural abscess, muscle pain that could be a symptom of pyomyositis)?  Keep in mind that for deep seeded infections MRI imaging with contrast is preferred rather than other often insensitive tests such as plain x-rays, especially early in a patient's presentation.  Change antibiotic therapy to  []  []  Beta-lactam antibiotics are preferred for MSSA due to higher cure rates.   If on Vancomycin, goal trough should be 15 - 20 mcg/mL  Estimated duration of IV antibiotic therapy:    Plan is to continue IV cefazolin for 6 weeks is the recommendation. (day 10) Pt is willing to stay for 3 weeks for IV abx then switch to oral for 3 weeks. We discussed doing a long acting abx such as dalbavacin upon d/c with follow up in the ID clinic. [x]  []  Consult case management for probably prolonged outpatient IV antibiotic therapy    LOS: 51 days   Earlene Plater, MD Internal Medicine, PGY1 Pager: [phone]  09/21/2019,12:48 PM

## 2019-09-22 LAB — GLUCOSE, CAPILLARY
Glucose-Capillary: 107 mg/dL — ABNORMAL HIGH (ref 70–99)
Glucose-Capillary: 102 mg/dL — ABNORMAL HIGH (ref 70–99)

## 2019-09-22 NOTE — Progress Notes (Signed)
PROGRESS NOTE  SHEILA GERVASI XYB:[id number] DOB: 24-May-1981 DOA: 07/31/2019 PCP: Patient, No Pcp Per  HPI/Recap of past 24 hours: Mr. Blyden is a 38 y.o. M with IVDA who presented with pain in the low back, left shoulder and right foot, as well as flu-like symptoms, progressing over a few days.  In the ER, imaging showed right heel abscess, left upper chest abscess and septic emboli.  Blood cultures obtained, started on empiric antibiotics, and admitted for presumptive endocarditis and for chest wall debridement.  Interim history: 9/2 patient admitted from ER to ICU, underwent debridement of chest wall abscess in the OR, placement of wound vac. MRI lumbar spine showed paraspinal abscess (2x2x4), epidural abscesses, sacral osteomyleitis and lumbar vertebral osteomyelitis/discitis Urine, blood and wound cultures from admission all growing MSSA but thankfully repeat blood cultures 9/3, 9/6, and 9/10 all without growth Ankle abscess I&D'd on 9/4 Transitioned to nafcillin on 9/7 Right shoulder bursa aspirated on 9/11  Patient defervesced and continued on nafcillin MRI pelvis and MRI right shoulder obtained on 10/11-13 showed progression of lumbar/sacral osteo, shrinkage but persistence of paraspinal abscess and newly noted osteomyelitis with abscess of the right humeral head.  Post I&D R shoulder on 09/13/19.  Cefazolin infusion start date 09/12/19;  end date 10/03/19.  09/22/19: Patient was seen and examined at his bedside this morning.  No acute events overnight.  He has no new complaints.  Ongoing treatment for staph aureus bacteremia.   Assessment/Plan: Principal Problem:   Staphylococcus aureus bacteremia Active Problems:   Sepsis ([place])   Abscess of paraspinous muscles   Osteomyelitis of lumbar spine (HCC)   Abscess in epidural space of lumbar spine   Abnormal chest CT   Septic embolism (HCC)   Community acquired pneumonia   IV drug abuse ([place])   Heroin abuse ([place])  Elevated troponin   UTI (urinary tract infection)   Renal failure   Hyperglycemia   Suspected endocarditis   Chest wall abscess   Abscess of right heel   Acute osteomyelitis of lumbar spine (HCC)   Paraspinal abscess (HCC)   S/P chest tube placement (HCC)   Acute hematogenous osteomyelitis, right humerus (HCC)  Sepsis from MSSA bacteremia due to IVDA Lumbar osteomyelitis/discitis L2-4 Sacral osteomyelitis Paraspinal and epidural abscess New L4-L5 septic arthritis and progressive L5-S1 discitis/osteomyelitis Checked with IR on 09/11/2019, paraspinal abscess is too small to aspirate, further it is improving with IV nafcillin. -Seen by infectious disease.  Appreciate recommendations. -Continue IV cefazolin 2 g 3 times daily -Obtain weekly CBC/BMP, CRP ESR  TTE done on 08/01/2019 no report of vegetation Cefazolin start date 10/14- end date 10/03/19  POD #9 post I&D on 09/13/2019 of newly diagnosed right humeral osteomyelitis with abscess of right proximal humerus/ Subacromial/subdeltoid septic bursitis in the setting of bacteremia MRI done on 09/11/2019 showed extensive right humeral head osteomyelitis, a humeral abscess and septic bursitis again. -Reconsulted ID and Orthopedics, appreciate expert cares  Oxycodone 5 mg every 4 hours as needed for moderate to severe pain Avoid hypoglycemia.  Goal blood sugars between 140 and 180.  Hypokalemia K+ 3.4 repleted with po kcl 40 meq daily x 2 days Repeat labs once weekly  Resolved opiate-induced constipation DC Senokot 2 tablet twice daily Continue MiraLAX daily as needed  Abscess right ankle  Resolved  Chest wall abscess s/p operative I&D with wound vac application 9/2 and 6/60, septic emboli. Healing well.  Newly diagnosed type II diabetes with hyperglycemia Glucose 700 at admission.  No previous  diagnosis of diabetes.  No family history of diabetes.  Not obese.  A1c here 7.8%. Increase sliding scale to resistant  IV drug  abuse -Continue telephonic psychology counseling as needed Hepatitis C screening nonreactive, HIV nonreactive on 08/01/2019 He is determined to keep his sobriety.  He has refrained from using his pain medications, has not used as needed oxycodone in 3 days.  He would like to return to work and maintain his sobriety.  Anemia of chronic disease -Continue weekly CBC  Hypertension BP controlled -Continue metoprolol 12.5 mg twice daily, will need rx at discharge  Other medications -Continue trazodone as needed for insomnia, will need rx at discharge  Physical debility PT rec outpt PT C/w PT with fall precaution    MDM and disposition: The below labs and imaging reports reviewed and summarized above.  Medication management as above.   The patient was admitted with MSSA bacteremia, septic pulmonary emboli, large chest wall abscess, right shoulder septic arthritis, and right ankle abscess.      Progressing well but will need to complete his IV antibiotic infusion.  End date 10/03/2019.   DVT prophylaxis:  Subcu Lovenox daily. Code Status: FULL Family Communication: None at bedside.    Consultants:   CT surgery  Orthopedics  Infectious disease  Neurosurgery  Procedures:   9/2 MRI lumbar spine  9/2 I&D left chest wall abscess with wound vac by Dr. Servando Snare  9/4 I&D right heel abscess at bedside by Hilbert Odor, [facility]  9/11 Excision wound debridemnt, wound vac change by Dr. Prescott Gum  9/14 right shoulder bursa aspiration by Silvestre Gunner, PAC  10/12 MRI pelvis   10/13 MRI RIGHT shoulder  Antimicrobials:   Nafcillin   Culture data:   9/1 urine culture -- MSSA   9/2 blood culture x2 -- MSSA in 2/2  9/2 wound culture -- MSSA  9/2 chest wall abscess intraop culture -- MSSA  9/3 blood culture x2 -- NG  9/4 heel abscess culture -- MSSA  9/6 blood culture x2 -- NG  9/10 blood culture x2 -- NG  9/14 aspirate shoulder culture -- NG      Objective: Vitals:   09/21/19 1212 09/21/19 1623 09/21/19 1956 09/22/19 1215  BP: 130/73 133/86 129/88 (!) 130/101  Pulse: 75 82 81 81  Resp: '17 17 16 16  ' Temp: (!) 97.5 F (36.4 C) 98.4 F (36.9 C) 98.1 F (36.7 C) 98.9 F (37.2 C)  TempSrc: Oral Oral Oral Oral  SpO2: 100% 100% 100% 99%  Weight:      Height:        Intake/Output Summary (Last 24 hours) at 09/22/2019 1506 Last data filed at 09/21/2019 1700 Gross per 24 hour  Intake 250 ml  Output -  Net 250 ml   Filed Weights   09/17/19 0500 09/18/19 0500 09/21/19 0500  Weight: 78.2 kg 79 kg 79 kg    Exam:  . General: 38 y.o. year-old male well-developed well-nourished in no acute distress.  Alert and oriented x4. . Cardiovascular: Regular rate and rhythm no rubs or gallops.  No JVD or thyromegaly noted.   Marland Kitchen Respiratory: Clear to auscultation no wheezes or rales. Abdomen: Soft nontender nondistended normal bowel sounds present.. Musculoskeletal: Right shoulder wound with staples healing well.  No drainage or erythema noted.   Psychiatry: Mood is appropriate for condition and setting.  Data Reviewed: CBC: Recent Labs  Lab 09/19/19 0927  WBC 3.9*  HGB 9.0*  HCT 27.6*  MCV 90.5  PLT 266  Basic Metabolic Panel: Recent Labs  Lab 09/19/19 0927  NA 142  K 3.4*  CL 104  CO2 29  GLUCOSE 89  BUN 18  CREATININE 0.72  CALCIUM 9.0   GFR: Estimated Creatinine Clearance: 139.9 mL/min (by C-G formula based on SCr of 0.72 mg/dL). Liver Function Tests: No results for input(s): AST, ALT, ALKPHOS, BILITOT, PROT, ALBUMIN in the last 168 hours. No results for input(s): LIPASE, AMYLASE in the last 168 hours. No results for input(s): AMMONIA in the last 168 hours. Coagulation Profile: No results for input(s): INR, PROTIME in the last 168 hours. Cardiac Enzymes: No results for input(s): CKTOTAL, CKMB, CKMBINDEX, TROPONINI in the last 168 hours. BNP (last 3 results) No results for input(s): PROBNP in the last 8760  hours. HbA1C: No results for input(s): HGBA1C in the last 72 hours. CBG: Recent Labs  Lab 09/21/19 0625 09/21/19 1116 09/21/19 1619 09/21/19 2111 09/22/19 0623  GLUCAP 124* 157* 157* 172* 107*   Lipid Profile: No results for input(s): CHOL, HDL, LDLCALC, TRIG, CHOLHDL, LDLDIRECT in the last 72 hours. Thyroid Function Tests: No results for input(s): TSH, T4TOTAL, FREET4, T3FREE, THYROIDAB in the last 72 hours. Anemia Panel: No results for input(s): VITAMINB12, FOLATE, FERRITIN, TIBC, IRON, RETICCTPCT in the last 72 hours. Urine analysis:    Component Value Date/Time   COLORURINE YELLOW 08/09/2019 1845   APPEARANCEUR CLEAR 08/09/2019 1845   LABSPEC 1.029 08/09/2019 1845   PHURINE 5.0 08/09/2019 1845   GLUCOSEU 50 (A) 08/09/2019 1845   HGBUR MODERATE (A) 08/09/2019 1845   BILIRUBINUR NEGATIVE 08/09/2019 1845   KETONESUR NEGATIVE 08/09/2019 1845   PROTEINUR 30 (A) 08/09/2019 1845   NITRITE NEGATIVE 08/09/2019 1845   LEUKOCYTESUR NEGATIVE 08/09/2019 1845   Sepsis Labs: '@[email or web address]' (procalcitonin:4,lacticidven:4)  ) Recent Results (from the past 240 hour(s))  Aerobic/Anaerobic Culture (surgical/deep wound)     Status: None   Collection Time: 09/13/19  6:17 PM   Specimen: Soft Tissue, Other  Result Value Ref Range Status   Specimen Description TISSUE  Final   Special Requests RIGHT SUBACROMIAL BURSA ID A  Final   Gram Stain   Final    FEW WBC PRESENT, PREDOMINANTLY PMN NO ORGANISMS SEEN    Culture   Final    No growth aerobically or anaerobically. Performed at [facility], [place] [address], [place], [place] [address]    Report Status 09/19/2019 FINAL  Final  Aerobic/Anaerobic Culture (surgical/deep wound)     Status: None   Collection Time: 09/13/19  6:34 PM   Specimen: Soft Tissue, Other  Result Value Ref Range Status   Specimen Description ABSCESS  Final   Special Requests RIGHT HUMERAL INTRAMEDULLARY CANAL ID B  Final   Gram Stain   Final    RARE WBC  PRESENT, PREDOMINANTLY PMN NO ORGANISMS SEEN    Culture   Final    No growth aerobically or anaerobically. Performed at [facility], [place] [address], [place], [place] [address]    Report Status 09/19/2019 FINAL  Final  Aerobic/Anaerobic Culture (surgical/deep wound)     Status: None   Collection Time: 09/13/19  6:47 PM   Specimen: Bone; Tissue  Result Value Ref Range Status   Specimen Description BONE  Final   Special Requests HUMERAL HEAD ID C  Final   Gram Stain   Final    RARE WBC PRESENT, PREDOMINANTLY PMN NO ORGANISMS SEEN    Culture   Final    No growth aerobically or anaerobically. Performed  at [facility], [place] [address], [place], [place] [address]    Report Status 09/19/2019 FINAL  Final      Studies: No results found.  Scheduled Meds: . enoxaparin (LOVENOX) injection  40 mg Subcutaneous Q24H  . feeding supplement (ENSURE ENLIVE)  237 mL Oral BID BM  . insulin aspart  0-20 Units Subcutaneous TID WC  . insulin aspart  0-5 Units Subcutaneous QHS  . magnesium oxide  400 mg Oral BID  . Melatonin  6 mg Oral Once  . metoprolol tartrate  12.5 mg Oral BID  . ondansetron  4 mg Intravenous Once  . polyethylene glycol  17 g Oral Daily  . senna-docusate  2 tablet Oral BID  . sodium chloride flush  10-40 mL Intracatheter Q12H    Continuous Infusions: . sodium chloride 1,000 mL (08/24/19 2247)  .  ceFAZolin (ANCEF) IV 2 g (09/22/19 0626)  . lactated ringers 10 mL/hr at 08/10/19 0845     LOS: 77 days     Kayleen Memos, MD [facility] Pager [phone]  If 7PM-7AM, please contact night-coverage [email or web address] Password [email or web address] 09/22/2019, 3:06 PM

## 2019-09-22 NOTE — Plan of Care (Signed)
Patient progressing towards plan of care goals. 

## 2019-09-23 LAB — GLUCOSE, CAPILLARY
Glucose-Capillary: 121 mg/dL — ABNORMAL HIGH (ref 70–99)
Glucose-Capillary: 105 mg/dL — ABNORMAL HIGH (ref 70–99)

## 2019-09-23 NOTE — Progress Notes (Signed)
PROGRESS NOTE  William [name] ZOX:[id number] DOB: [birth year] DOA: 07/31/2019 PCP: Patient, No Pcp Per  HPI/Recap of past 24 hours: [name] is a 38 y.o. M with IVDA who presented with pain in the low back, left shoulder and right foot, as well as flu-like symptoms, progressing over a few days.  In the ER, imaging showed right heel abscess, left upper chest abscess and septic emboli.  Blood cultures obtained, started on empiric antibiotics, and admitted for presumptive endocarditis and for chest wall debridement.  Interim history: 9/2 patient admitted from ER to ICU, underwent debridement of chest wall abscess in the OR, placement of wound vac. MRI lumbar spine showed paraspinal abscess (2x2x4), epidural abscesses, sacral osteomyleitis and lumbar vertebral osteomyelitis/discitis Urine, blood and wound cultures from admission all growing MSSA but thankfully repeat blood cultures 9/3, 9/6, and 9/10 all without growth Ankle abscess I&D'd on 9/4 Transitioned to nafcillin on 9/7 Right shoulder bursa aspirated on 9/11  Patient defervesced and continued on nafcillin MRI pelvis and MRI right shoulder obtained on 10/11-13 showed progression of lumbar/sacral osteo, shrinkage but persistence of paraspinal abscess and newly noted osteomyelitis with abscess of the right humeral head.  Post I&D R shoulder on 09/13/19.  Cefazolin infusion start date 09/12/19;  end date 10/03/19.  09/23/19: Patient stable overnight without any acute events.  Does complain of some mild stiffness in the left shoulder.  Pain well controlled.   Assessment/Plan: Principal Problem:   Staphylococcus aureus bacteremia Active Problems:   Sepsis ([place])   Abscess of paraspinous muscles   Osteomyelitis of lumbar spine (HCC)   Abscess in epidural space of lumbar spine   Abnormal chest CT   Septic embolism (HCC)   Community acquired pneumonia   IV drug abuse ([place])   Heroin abuse ([place])   Elevated troponin   UTI (urinary  tract infection)   Renal failure   Hyperglycemia   Suspected endocarditis   Chest wall abscess   Abscess of right heel   Acute osteomyelitis of lumbar spine (HCC)   Paraspinal abscess (HCC)   S/P chest tube placement (HCC)   Acute hematogenous osteomyelitis, right humerus (HCC)  Sepsis from MSSA bacteremia due to IVDA Lumbar osteomyelitis/discitis L2-4 Sacral osteomyelitis Paraspinal and epidural abscess New L4-L5 septic arthritis and progressive L5-S1 discitis/osteomyelitis Checked with IR on 09/11/2019, paraspinal abscess is too small to aspirate, further it is improving with IV nafcillin. -Seen by infectious disease.  Appreciate recommendations. -Continue IV cefazolin 2 g 3 times daily -Obtain weekly CBC/BMP, CRP ESR  TTE done on 08/01/2019 no report of vegetation Cefazolin start date 10/14- end date 10/03/19  POD #9 post I&D on 09/13/2019 of newly diagnosed right humeral osteomyelitis with abscess of right proximal humerus/ Subacromial/subdeltoid septic bursitis in the setting of bacteremia MRI done on 09/11/2019 showed extensive right humeral head osteomyelitis, a humeral abscess and septic bursitis again. -Reconsulted ID and Orthopedics, appreciate expert cares  Oxycodone 5 mg every 4 hours as needed for moderate to severe pain Avoid hypoglycemia.  Goal blood sugars between 140 and 180.  Hypokalemia K+ 3.4 repleted with po kcl 40 meq daily x 2 days Repeat labs once weekly  Resolved opiate-induced constipation DC Senokot 2 tablet twice daily Continue MiraLAX daily as needed  Abscess right ankle  Resolved  Chest wall abscess s/p operative I&D with wound vac application 9/2 and 8/11, septic emboli. Healing well.  Newly diagnosed type II diabetes with hyperglycemia Glucose 700 at admission.  No previous diagnosis of diabetes.  No family history of diabetes.  Not obese.  A1c here 7.8%. Increase sliding scale to resistant  IV drug abuse -Continue telephonic  psychology counseling as needed Hepatitis C screening nonreactive, HIV nonreactive on 08/01/2019 He is determined to keep his sobriety.  He has refrained from using his pain medications, has not used as needed oxycodone in 3 days.  He would like to return to work and maintain his sobriety.  Anemia of chronic disease -Continue weekly CBC  Hypertension BP controlled -Continue metoprolol 12.5 mg twice daily, will need rx at discharge  Other medications -Continue trazodone as needed for insomnia, will need rx at discharge  Physical debility PT rec outpt PT C/w PT with fall precaution    MDM and disposition: The below labs and imaging reports reviewed and summarized above.  Medication management as above.   The patient was admitted with MSSA bacteremia, septic pulmonary emboli, large chest wall abscess, right shoulder septic arthritis, and right ankle abscess.      Progressing well but will need to complete his IV antibiotic infusion.  End date 10/03/2019.   DVT prophylaxis:  Subcu Lovenox daily. Code Status: FULL Family Communication: None at bedside.    Consultants:   CT surgery  Orthopedics  Infectious disease  Neurosurgery  Procedures:   9/2 MRI lumbar spine  9/2 I&D left chest wall abscess with wound vac by Dr. Servando Snare  9/4 I&D right heel abscess at bedside by Hilbert Odor, [facility]  9/11 Excision wound debridemnt, wound vac change by Dr. Prescott Gum  9/14 right shoulder bursa aspiration by Silvestre Gunner, PAC  10/12 MRI pelvis   10/13 MRI RIGHT shoulder  Antimicrobials:   Nafcillin   Culture data:   9/1 urine culture -- MSSA   9/2 blood culture x2 -- MSSA in 2/2  9/2 wound culture -- MSSA  9/2 chest wall abscess intraop culture -- MSSA  9/3 blood culture x2 -- NG  9/4 heel abscess culture -- MSSA  9/6 blood culture x2 -- NG  9/10 blood culture x2 -- NG  9/14 aspirate shoulder culture -- NG     Objective: Vitals:    09/22/19 1215 09/22/19 2022 09/23/19 0812 09/23/19 1139  BP: (!) 130/101 (!) 143/99 (!) 138/94 (!) 143/110  Pulse: 81 80 70 78  Resp: '16 18 16 16  ' Temp: 98.9 F (37.2 C) 98.6 F (37 C)  97.9 F (36.6 C)  TempSrc: Oral Oral  Oral  SpO2: 99% 100% 99% 100%  Weight:      Height:        Intake/Output Summary (Last 24 hours) at 09/23/2019 1227 Last data filed at 09/22/2019 2300 Gross per 24 hour  Intake 460 ml  Output -  Net 460 ml   Filed Weights   09/17/19 0500 09/18/19 0500 09/21/19 0500  Weight: 78.2 kg 79 kg 79 kg    Exam:  . General: 38 y.o. year-old male well-developed well-nourished in no acute distress.  Alert and oriented x4. . Cardiovascular: Regular rate with no murmurs, rubs, gallops . Respiratory: Respiratory effort.  Clear to auscultation with no wheezes, rales, rhonchi. . Abdomen: Soft, nontender, nondistended.  Normal bowel sounds. . Musculoskeletal: Right shoulder wound with staples healing well.  No drainage or erythema noted.   Marland Kitchen Psychiatry: Mood is appropriate for condition and setting.  Data Reviewed: CBC: Recent Labs  Lab 09/19/19 0927  WBC 3.9*  HGB 9.0*  HCT 27.6*  MCV 90.5  PLT 161   Basic Metabolic Panel: Recent Labs  Lab 09/19/19 0927  NA 142  K 3.4*  CL 104  CO2 29  GLUCOSE 89  BUN 18  CREATININE 0.72  CALCIUM 9.0   GFR: Estimated Creatinine Clearance: 139.9 mL/min (by C-G formula based on SCr of 0.72 mg/dL). Liver Function Tests: No results for input(s): AST, ALT, ALKPHOS, BILITOT, PROT, ALBUMIN in the last 168 hours. No results for input(s): LIPASE, AMYLASE in the last 168 hours. No results for input(s): AMMONIA in the last 168 hours. Coagulation Profile: No results for input(s): INR, PROTIME in the last 168 hours. Cardiac Enzymes: No results for input(s): CKTOTAL, CKMB, CKMBINDEX, TROPONINI in the last 168 hours. BNP (last 3 results) No results for input(s): PROBNP in the last 8760 hours. HbA1C: No results for input(s):  HGBA1C in the last 72 hours. CBG: Recent Labs  Lab 09/21/19 1116 09/21/19 1619 09/21/19 2111 09/22/19 0623 09/22/19 1615  GLUCAP 157* 157* 172* 107* 102*   Lipid Profile: No results for input(s): CHOL, HDL, LDLCALC, TRIG, CHOLHDL, LDLDIRECT in the last 72 hours. Thyroid Function Tests: No results for input(s): TSH, T4TOTAL, FREET4, T3FREE, THYROIDAB in the last 72 hours. Anemia Panel: No results for input(s): VITAMINB12, FOLATE, FERRITIN, TIBC, IRON, RETICCTPCT in the last 72 hours. Urine analysis:    Component Value Date/Time   COLORURINE YELLOW 08/09/2019 1845   APPEARANCEUR CLEAR 08/09/2019 1845   LABSPEC 1.029 08/09/2019 1845   PHURINE 5.0 08/09/2019 1845   GLUCOSEU 50 (A) 08/09/2019 1845   HGBUR MODERATE (A) 08/09/2019 1845   BILIRUBINUR NEGATIVE 08/09/2019 1845   KETONESUR NEGATIVE 08/09/2019 1845   PROTEINUR 30 (A) 08/09/2019 1845   NITRITE NEGATIVE 08/09/2019 1845   LEUKOCYTESUR NEGATIVE 08/09/2019 1845   Sepsis Labs: '@[email or web address]' (procalcitonin:4,lacticidven:4)  ) Recent Results (from the past 240 hour(s))  Aerobic/Anaerobic Culture (surgical/deep wound)     Status: None   Collection Time: 09/13/19  6:17 PM   Specimen: Soft Tissue, Other  Result Value Ref Range Status   Specimen Description TISSUE  Final   Special Requests RIGHT SUBACROMIAL BURSA ID A  Final   Gram Stain   Final    FEW WBC PRESENT, PREDOMINANTLY PMN NO ORGANISMS SEEN    Culture   Final    No growth aerobically or anaerobically. Performed at [facility], [place] [address] [place], [place] [address]    Report Status 09/19/2019 FINAL  Final  Aerobic/Anaerobic Culture (surgical/deep wound)     Status: None   Collection Time: 09/13/19  6:34 PM   Specimen: Soft Tissue, Other  Result Value Ref Range Status   Specimen Description ABSCESS  Final   Special Requests RIGHT HUMERAL INTRAMEDULLARY CANAL ID B  Final   Gram Stain   Final    RARE WBC PRESENT, PREDOMINANTLY PMN NO ORGANISMS SEEN     Culture   Final    No growth aerobically or anaerobically. Performed at [facility], [place] [address], [place], [place] [address]    Report Status 09/19/2019 FINAL  Final  Aerobic/Anaerobic Culture (surgical/deep wound)     Status: None   Collection Time: 09/13/19  6:47 PM   Specimen: Bone; Tissue  Result Value Ref Range Status   Specimen Description BONE  Final   Special Requests HUMERAL HEAD ID C  Final   Gram Stain   Final    RARE WBC PRESENT, PREDOMINANTLY PMN NO ORGANISMS SEEN    Culture   Final    No growth aerobically or anaerobically. Performed at [facility], [address] [place], [place] [address]    Report Status 09/19/2019 FINAL  Final      Studies: No results found.  Scheduled Meds: . enoxaparin (LOVENOX) injection  40 mg Subcutaneous Q24H  . feeding supplement (ENSURE ENLIVE)  237 mL Oral BID BM  . insulin aspart  0-20 Units Subcutaneous TID WC  . insulin aspart  0-5 Units Subcutaneous QHS  . magnesium oxide  400 mg Oral BID  . Melatonin  6 mg Oral Once  . metoprolol tartrate  12.5 mg Oral BID  . ondansetron  4 mg Intravenous Once  . polyethylene glycol  17 g Oral Daily  . senna-docusate  2 tablet Oral BID  . sodium chloride flush  10-40 mL Intracatheter Q12H    Continuous Infusions: . sodium chloride 1,000 mL (08/24/19 2247)  .  ceFAZolin (ANCEF) IV 2 g (09/23/19 0828)  . lactated ringers 10 mL/hr at 08/10/19 0845     LOS: 54 days     Truett Mainland, DO [facility]  09/23/2019, 12:27 PM

## 2019-09-24 LAB — GLUCOSE, CAPILLARY
Glucose-Capillary: 114 mg/dL — ABNORMAL HIGH (ref 70–99)
Glucose-Capillary: 83 mg/dL (ref 70–99)
Glucose-Capillary: 104 mg/dL — ABNORMAL HIGH (ref 70–99)
Glucose-Capillary: 156 mg/dL — ABNORMAL HIGH (ref 70–99)

## 2019-09-24 MED ORDER — POLYETHYLENE GLYCOL 3350 17 G PO PACK: 17.0000 g | PACK | Freq: Every day | ORAL | Status: DC | PRN

## 2019-09-24 MED ORDER — DICLOFENAC SODIUM 1 % TD GEL
4.0000 g | Freq: Two times a day (BID) | TRANSDERMAL | Status: AC
  Administered 2019-09-24 – 2019-09-26 (×4): 4 g via TOPICAL
  Filled 2019-09-24: qty 100

## 2019-09-24 NOTE — Progress Notes (Signed)
PROGRESS NOTE  CORDARRIUS COAD KDT:[id number] DOB: [birth year] DOA: 07/31/2019 PCP: Patient, No Pcp Per  HPI/Recap of past 24 hours: William [name] is a 38 y.o. M with IVDA who presented with pain in the low back, left shoulder and right foot, as well as flu-like symptoms, progressing over a few days.  In the ER, imaging showed right heel abscess, left upper chest abscess and septic emboli.  Blood cultures obtained, started on empiric antibiotics, and admitted for presumptive endocarditis and for chest wall debridement.  Interim history: 9/2 patient admitted from ER to ICU, underwent debridement of chest wall abscess in the OR, placement of wound vac. MRI lumbar spine showed paraspinal abscess (2x2x4), epidural abscesses, sacral osteomyleitis and lumbar vertebral osteomyelitis/discitis Urine, blood and wound cultures from admission all growing MSSA but thankfully repeat blood cultures 9/3, 9/6, and 9/10 all without growth Ankle abscess I&D'd on 9/4 Transitioned to nafcillin on 9/7 Right shoulder bursa aspirated on 9/11  Patient defervesced and continued on nafcillin MRI pelvis and MRI right shoulder obtained on 10/11-13 showed progression of lumbar/sacral osteo, shrinkage but persistence of paraspinal abscess and newly noted osteomyelitis with abscess of the right humeral head.  Post I&D R shoulder on 09/13/19.  Cefazolin infusion start date 09/12/19;  end date 10/03/19.  09/24/19: Patient was seen and examined at his bedside this morning.  No acute events overnight.  Vital signs reviewed and are stable.   Assessment/Plan: Principal Problem:   Staphylococcus aureus bacteremia Active Problems:   Sepsis ([place])   Abscess of paraspinous muscles   Osteomyelitis of lumbar spine (HCC)   Abscess in epidural space of lumbar spine   Abnormal chest CT   Septic embolism (HCC)   Community acquired pneumonia   IV drug abuse ([place])   Heroin abuse ([place])   Elevated troponin   UTI (urinary tract  infection)   Renal failure   Hyperglycemia   Suspected endocarditis   Chest wall abscess   Abscess of right heel   Acute osteomyelitis of lumbar spine (HCC)   Paraspinal abscess (HCC)   S/P chest tube placement (HCC)   Acute hematogenous osteomyelitis, right humerus (HCC)  Sepsis from MSSA bacteremia due to IVDA Lumbar osteomyelitis/discitis L2-4 Sacral osteomyelitis Paraspinal and epidural abscess New L4-L5 septic arthritis and progressive L5-S1 discitis/osteomyelitis Checked with IR on 09/11/2019, paraspinal abscess is too small to aspirate, further it is improving with IV nafcillin. -Seen by infectious disease.  Appreciate recommendations. -Continue IV cefazolin 2 g 3 times daily -Obtain weekly CBC/BMP, CRP ESR  TTE done on 08/01/2019 no report of vegetation Cefazolin start date 10/14- end date 10/03/19  POD #11 post I&D on 09/13/2019 of newly diagnosed right humeral osteomyelitis with abscess of right proximal humerus/ Subacromial/subdeltoid septic bursitis in the setting of bacteremia MRI done on 09/11/2019 showed extensive right humeral head osteomyelitis, a humeral abscess and septic bursitis again. -Reconsulted ID and Orthopedics, appreciate expert cares  Oxycodone 5 mg every 4 hours as needed for moderate to severe pain Avoid hypoglycemia.  Goal blood sugars between 140 and 180.  Hypokalemia K+ 3.4 repleted with po kcl 40 meq daily x 2 days Repeat labs once weekly  Resolved opiate-induced constipation DC Senokot 2 tablet twice daily Continue MiraLAX daily as needed  Abscess right ankle  Resolved  Chest wall abscess s/p operative I&D with wound vac application 9/2 and 9/98, septic emboli. Healing well.  Newly diagnosed type II diabetes with hyperglycemia Glucose 700 at admission.  No previous diagnosis of diabetes.  No family history of diabetes.  Not obese.  A1c here 7.8%. Increase sliding scale to resistant  IV drug abuse -Continue telephonic psychology  counseling as needed Hepatitis C screening nonreactive, HIV nonreactive on 08/01/2019 He is determined to keep his sobriety.  He has refrained from using his pain medications, has not used as needed oxycodone in 3 days.  He would like to return to work and maintain his sobriety.  Anemia of chronic disease -Continue weekly CBC  Hypertension BP controlled -Continue metoprolol 12.5 mg twice daily, will need rx at discharge  Other medications -Continue trazodone as needed for insomnia, will need rx at discharge  Physical debility PT rec outpt PT C/w PT with fall precaution    MDM and disposition: The below labs and imaging reports reviewed and summarized above.  Medication management as above.   The patient was admitted with MSSA bacteremia, septic pulmonary emboli, large chest wall abscess, right shoulder septic arthritis, and right ankle abscess.      Progressing well but will need to complete his IV antibiotic infusion.  End date 10/03/2019.   DVT prophylaxis:  Subcu Lovenox daily. Code Status: FULL Family Communication: None at bedside.    Consultants:   CT surgery  Orthopedics  Infectious disease  Neurosurgery  Procedures:   9/2 MRI lumbar spine  9/2 I&D left chest wall abscess with wound vac by Dr. Servando Snare  9/4 I&D right heel abscess at bedside by Hilbert Odor, [facility]  9/11 Excision wound debridemnt, wound vac change by Dr. Prescott Gum  9/14 right shoulder bursa aspiration by Silvestre Gunner, PAC  10/12 MRI pelvis   10/13 MRI RIGHT shoulder  Antimicrobials:   Nafcillin   Culture data:   9/1 urine culture -- MSSA   9/2 blood culture x2 -- MSSA in 2/2  9/2 wound culture -- MSSA  9/2 chest wall abscess intraop culture -- MSSA  9/3 blood culture x2 -- NG  9/4 heel abscess culture -- MSSA  9/6 blood culture x2 -- NG  9/10 blood culture x2 -- NG  9/14 aspirate shoulder culture -- NG     Objective: Vitals:   09/24/19  0015 09/24/19 0852 09/24/19 1201 09/24/19 1539  BP: 109/66 (!) 146/89 133/86 129/85  Pulse: 69 70 74 76  Resp: _0 Temp: (!) 97.5 F (36.4 C) 97.8 F (36.6 C) 97.7 F (36.5 C) (!) 97.5 F (36.4 C)  TempSrc: Oral Oral Oral Oral  SpO2: 99% 99% 100% 100%  Weight:      Height:        Intake/Output Summary (Last 24 hours) at 09/24/2019 1606 Last data filed at 09/24/2019 0700 Gross per 24 hour  Intake 640 ml  Output 1200 ml  Net -560 ml   Filed Weights   09/17/19 0500 09/18/19 0500 09/21/19 0500  Weight: 78.2 kg 79 kg 79 kg    Exam:   General: 38 y.o. year-old male well-developed well-nourished no acute distress.  Alert and oriented x4.  Cardiovascular: Regular rate and rhythm no rubs or gallops no JVD noted.  Respiratory: Clear to Auscultation No Wheezes or Rales. Abdomen: Soft nontender nondistended normal bowel sounds present. Musculoskeletal: Right shoulder wound appears clean with no drainage. Psychiatry: Mood is appropriate for condition and setting.  Data Reviewed: CBC: Recent Labs  Lab 09/19/19 0927  WBC 3.9*  HGB 9.0*  HCT 27.6*  MCV 90.5  PLT 601   Basic Metabolic Panel: Recent Labs  Lab 09/19/19 0927  NA 142  K 3.4*  CL 104  CO2 29  GLUCOSE 89  BUN 18  CREATININE 0.72  CALCIUM 9.0   GFR: Estimated Creatinine Clearance: 139.9 mL/min (by C-G formula based on SCr of 0.72 mg/dL). Liver Function Tests: No results for input(s): AST, ALT, ALKPHOS, BILITOT, PROT, ALBUMIN in the last 168 hours. No results for input(s): LIPASE, AMYLASE in the last 168 hours. No results for input(s): AMMONIA in the last 168 hours. Coagulation Profile: No results for input(s): INR, PROTIME in the last 168 hours. Cardiac Enzymes: No results for input(s): CKTOTAL, CKMB, CKMBINDEX, TROPONINI in the last 168 hours. BNP (last 3 results) No results for input(s): PROBNP in the last 8760 hours. HbA1C: No results for input(s): HGBA1C in the last 72  hours. CBG: Recent Labs  Lab 09/23/19 1706 09/23/19 2131 09/24/19 0607 09/24/19 1233 09/24/19 1543  GLUCAP 121* 105* 114* 83 104*   Lipid Profile: No results for input(s): CHOL, HDL, LDLCALC, TRIG, CHOLHDL, LDLDIRECT in the last 72 hours. Thyroid Function Tests: No results for input(s): TSH, T4TOTAL, FREET4, T3FREE, THYROIDAB in the last 72 hours. Anemia Panel: No results for input(s): VITAMINB12, FOLATE, FERRITIN, TIBC, IRON, RETICCTPCT in the last 72 hours. Urine analysis:    Component Value Date/Time   COLORURINE YELLOW 08/09/2019 1845   APPEARANCEUR CLEAR 08/09/2019 1845   LABSPEC 1.029 08/09/2019 1845   PHURINE 5.0 08/09/2019 1845   GLUCOSEU 50 (A) 08/09/2019 1845   HGBUR MODERATE (A) 08/09/2019 1845   BILIRUBINUR NEGATIVE 08/09/2019 1845   KETONESUR NEGATIVE 08/09/2019 1845   PROTEINUR 30 (A) 08/09/2019 1845   NITRITE NEGATIVE 08/09/2019 1845   LEUKOCYTESUR NEGATIVE 08/09/2019 1845   Sepsis Labs: _0 (procalcitonin:4,lacticidven:4)  ) No results found for this or any previous visit (from the past 240 hour(s)).    Studies: No results found.  Scheduled Meds:  diclofenac sodium  4 g Topical BID   enoxaparin (LOVENOX) injection  40 mg Subcutaneous Q24H   feeding supplement (ENSURE ENLIVE)  237 mL Oral BID BM   insulin aspart  0-20 Units Subcutaneous TID WC   insulin aspart  0-5 Units Subcutaneous QHS   magnesium oxide  400 mg Oral BID   Melatonin  6 mg Oral Once   metoprolol tartrate  12.5 mg Oral BID   ondansetron  4 mg Intravenous Once   sodium chloride flush  10-40 mL Intracatheter Q12H    Continuous Infusions:  sodium chloride 1,000 mL (08/24/19 2247)    ceFAZolin (ANCEF) IV 2 g (09/24/19 1426)   lactated ringers 10 mL/hr at 08/10/19 0845     LOS: 14 days     Kayleen Memos, MD [facility] Pager [phone]  If 7PM-7AM, please contact night-coverage [email or web address] Password [email or web address] 09/24/2019, 4:06 PM

## 2019-09-24 NOTE — Progress Notes (Signed)
Nutrition Follow-up  DOCUMENTATION CODES:   Not applicable  INTERVENTION:  Continue Ensure Enlive po BID, each supplement provides 350 kcal and 20 grams of protein.  Encourage adequate PO intake.   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 Pt with sepsis present on admission with MSSA bacteremia due to IV drug use. Septic pulmonary emboli, Left chest wall abscess, Right heel abscess, Extensive right posterolateral epidural abscess L2-L3, Osteomyelitis of the L5-S1, Posterior epidural abscess L5-S1 S2. Status post incision and drainage and debridement left chest wall on wound VAC9/3. S/p R heel I&D 9/4.9/11 wound debridement and wound VAC change. VAC discontinued 9/25.Pt with septic bursitis right shoulder s/p 9/14 right shoulder bursa aspiration. Cefazolin start date 10/14- end date 10/03/19.  Meal completion has been 100%. Appetite and intake has been good. Pt currently has Ensure ordered with varied consumption. RD to continue with current orders to aid increased caloric and protein needs as well as in healing.    Labs and medications reviewed.   Diet Order:   Diet Order            Diet regular Room service appropriate? Yes; Fluid consistency: Thin  Diet effective now              EDUCATION NEEDS:   Education needs have been addressed  Skin:  Skin Assessment: Reviewed RN Assessment Skin Integrity Issues:: Incisions Wound Vac: N/A Incisions: L chest  Last BM:   10/23  Height:   Ht Readings from Last 1 Encounters:  09/13/19 6' 2.02" (1.88 m)    Weight:   Wt Readings from Last 1 Encounters:  09/21/19 79 kg    Ideal Body Weight:  86.36 kg  BMI:  Body mass index is 22.35 kg/m.  Estimated Nutritional Needs:   Kcal:  2150-2350  Protein:  105-120 grams  Fluid:  >/= 2.1 L/day    William Parker, MS, RD, LDN Pager # [phone] After hours/ weekend pager # [phone]

## 2019-09-24 NOTE — Plan of Care (Signed)

## 2019-09-24 NOTE — Progress Notes (Signed)
   Ortho Hand Progress Note  Subjective: No acute events. Pain in right shoulder improved.  Objective: Vital signs in last 24 hours: Temp:  [97.5 F (36.4 C)-98 F (36.7 C)] 97.7 F (36.5 C) (10/26 1201) Pulse Rate:  [69-84] 74 (10/26 1201) Resp:  [16-18] 16 (10/26 1201) BP: (109-146)/(66-89) 133/86 (10/26 1201) SpO2:  [98 %-100 %] 100 % (10/26 1201)  Intake/Output from previous day: 10/25 0701 - 10/26 0700 In: 640 [P.O.:390; I.V.:50; IV Piggyback:200] Out: 1200 [Urine:1200] Intake/Output this shift: No intake/output data recorded.  No results for input(s): HGB in the last 72 hours. No results for input(s): WBC, RBC, HCT, PLT in the last 72 hours. No results for input(s): NA, K, CL, CO2, BUN, CREATININE, GLUCOSE, CALCIUM in the last 72 hours. No results for input(s): LABPT, INR in the last 72 hours.  Aaox3 nad Resp nonlabored RRR RUE: incision c/d/i. No swelling. No erythema. Minimal ttp. FF 90 degrees. Motor intact ain/pin/u/a. SILT m/u/r/a. Fingers wwp with bcr   Assessment/Plan: Right subdeltoid, subacromial septic bursitis, proximal humerus osteomyelitis with abscess in humeral shaft s/p excision of bursa, saucerization of proximal humerus and humeral shaft. DOS: 09/13/2019  - Medicine primary appreciate managemenrt - ID consulted and continue abx recs - cxs negative. - dressing changes per nursing - Staples removed - Sling as needed for comfort. - NWB RUE. PT for passive and active ROM exercises - Remainder per primary   Follow up with me 1-2 weeks following discharge   Avanell Shackleton III 09/24/2019, 12:33 PM  [phone]

## 2019-09-25 LAB — GLUCOSE, CAPILLARY
Glucose-Capillary: 112 mg/dL — ABNORMAL HIGH (ref 70–99)
Glucose-Capillary: 149 mg/dL — ABNORMAL HIGH (ref 70–99)
Glucose-Capillary: 96 mg/dL (ref 70–99)
Glucose-Capillary: 213 mg/dL — ABNORMAL HIGH (ref 70–99)

## 2019-09-25 NOTE — Progress Notes (Signed)
Occupational Therapy Treatment Patient Details Name: William [name] MRN: [id number] DOB: 1980-12-25 Today's Date: 09/25/2019    History of present illness Patient is a 38 y/o male who presents with lower back pain, left shoulder pain, right foot and heel pain. Found to have right heel abscess s/p I&D 9/4, left upper chest wall abscess and mass s/p I&D and wound vac application 9/3, new onset DM, sepsis with MSSA bacteremia due to IV drug use. CT scan- left anterior chest wall soft tissue and intramuscular infection. Right subdeltoid, subacromial septic bursitis, proximal humerus osteomyelitis with abscess in humeral shaft s/p excision of bursa, saucerization of proximal humerus and humeral shaft: DOS: 09/13/2019. PMH- heroin IV drug use.    OT comments  Pt continues to demonstrate good adherence to RUE HEP and demonstrates progress with increasing ROM with PROM/AROM. Pt continues to demonstrate functional limitations with RUE and continues to benefit from progressing RUE HEP to maximize independence with ADL. Pt will continue to benefit from skilled OT services to maximize safety and independence with ADL/IADL and functional mobility. Will continue to follow acutely and progress as tolerated.    Follow Up Recommendations  Outpatient OT    Equipment Recommendations  None recommended by OT    Recommendations for Other Services      Precautions / Restrictions Precautions Precautions: Fall Precaution Comments: shoulder surgery on 10/15;NWB RUE Restrictions Weight Bearing Restrictions: Yes RUE Weight Bearing: Non weight bearing Other Position/Activity Restrictions: Gentle PROM and AROM       Mobility Bed Mobility Overal bed mobility: Needs Assistance Bed Mobility: Rolling;Sit to Sidelying Rolling: Supervision       Sit to sidelying: Supervision General bed mobility comments: for NWB   Transfers Overall transfer level: Needs assistance Equipment used: None Transfers: Sit  to/from Stand Sit to Stand: Supervision         General transfer comment: for safety and vc for NWB precaution    Balance Overall balance assessment: Needs assistance Sitting-balance support: Feet supported;No upper extremity supported Sitting balance-Leahy Scale: Normal     Standing balance support: No upper extremity supported;During functional activity Standing balance-Leahy Scale: Good Standing balance comment: when back pain is low balance is good, when back pain increases balance is fair                           ADL either performed or assessed with clinical judgement   ADL Overall ADL's : Needs assistance/impaired     Grooming: Brushing hair;Minimal assistance Grooming Details (indicate cue type and reason): reports he is unable to use RUE to wash hair;                 Toilet Transfer: Magazine features editor Details (indicate cue type and reason): simulated in room          Functional mobility during ADLs: Min guard General ADL Comments: minguard without support, minguard for safety, minor instability noted with appeared increased pain in back (pt grimacing and holding his back)vc for NWB precautions     Vision       Perception     Praxis      Cognition Arousal/Alertness: Awake/alert Behavior During Therapy: WFL for tasks assessed/performed Overall Cognitive Status: Within Functional Limits for tasks assessed  Exercises Exercises: General Upper Extremity General Exercises - Upper Extremity Shoulder Flexion: PROM;10 reps;Right;AROM Shoulder ABduction: PROM;10 reps;AROM;Seated Shoulder ADduction: PROM;AROM;10 reps;Seated Other Exercises Other Exercises: PROM shoulder external rotation- 10 reps Other Exercises: Shoulder IR/ER AAROM x10 reps Other Exercises: Pendulum swings x10 both directions   Shoulder Instructions       General Comments      Pertinent Vitals/  Pain       Pain Assessment: Faces Faces Pain Scale: Hurts a little bit Pain Location: R shoulder with AROM Pain Descriptors / Indicators: Grimacing Pain Intervention(s): Limited activity within patient's tolerance;Monitored during session  Home Living                                          Prior Functioning/Environment              Frequency  Min 2X/week        Progress Toward Goals  OT Goals(current goals can now be found in the care plan section)  Progress towards OT goals: Progressing toward goals  Acute Rehab OT Goals Patient Stated Goal: go home OT Goal Formulation: With patient Time For Goal Achievement: 10/04/19 Potential to Achieve Goals: Good ADL Goals Pt/caregiver will Perform Home Exercise Program: Right Upper extremity;Increased strength;Increased ROM;With written HEP provided Additional ADL Goal #1: Pt will increase to independence with OOB ADL with RUE pain <2/10.  Plan Discharge plan remains appropriate    Co-evaluation                 AM-PAC OT "6 Clicks" Daily Activity     Outcome Measure   Help from another person eating meals?: None Help from another person taking care of personal grooming?: None Help from another person toileting, which includes using toliet, bedpan, or urinal?: None Help from another person bathing (including washing, rinsing, drying)?: A Little Help from another person to put on and taking off regular upper body clothing?: None Help from another person to put on and taking off regular lower body clothing?: None 6 Click Score: 23    End of Session    OT Visit Diagnosis: Unsteadiness on feet (R26.81);Muscle weakness (generalized) (M62.81)   Activity Tolerance Patient tolerated treatment well   Patient Left in chair;with call bell/phone within reach   Nurse Communication Mobility status        Time: 1208-1227 OT Time Calculation (min): 19 min  Charges: OT General Charges $OT Visit: 1  Visit OT Treatments $Therapeutic Exercise: 8-22 mins  Diona Browner OTR/L Acute Rehabilitation Services Office: [phone]    Rebeca Alert 09/25/2019, 12:52 PM

## 2019-09-25 NOTE — Progress Notes (Signed)
Per pt request, and with ok from MD, he wants to not be disturbed between midnight and [id number].  Said he cannot go back to sleep when awakened for 0400 assessment.  This nurse did open door and observe him breathing and sleeping peacefully.  This entry into his room did not disturb him, as he did not awaken.  No signs of distress.

## 2019-09-25 NOTE — Progress Notes (Signed)
PROGRESS NOTE  William [name] QQP:[id number] DOB: [birth year] DOA: 07/31/2019 PCP: Patient, No Pcp Per  HPI/Recap of past 24 hours: William [name] is a 38 y.o. M with IVDA who presented with pain in the low back, left shoulder and right foot, as well as flu-like symptoms, progressing over a few days.  In the ER, imaging showed right heel abscess, left upper chest abscess and septic emboli.  Blood cultures obtained, started on empiric antibiotics, and admitted for presumptive endocarditis and for chest wall debridement.  Interim history: 9/2 patient admitted from ER to ICU, underwent debridement of chest wall abscess in the OR, placement of wound vac. MRI lumbar spine showed paraspinal abscess (2x2x4), epidural abscesses, sacral osteomyleitis and lumbar vertebral osteomyelitis/discitis Urine, blood and wound cultures from admission all growing MSSA but thankfully repeat blood cultures 9/3, 9/6, and 9/10 all without growth Ankle abscess I&D'd on 9/4 Transitioned to nafcillin on 9/7 Right shoulder bursa aspirated on 9/11  Patient defervesced and continued on nafcillin MRI pelvis and MRI right shoulder obtained on 10/11-13 showed progression of lumbar/sacral osteo, shrinkage but persistence of paraspinal abscess and newly noted osteomyelitis with abscess of the right humeral head.  Post I&D R shoulder on 09/13/19.  Cefazolin infusion start date 09/12/19;  end date 10/03/19.  09/25/19: Patient was seen and examined at his bedside this morning.  No acute events overnight.  No new complaints.  Will obtain weekly labs in the morning.   Assessment/Plan: Principal Problem:   Staphylococcus aureus bacteremia Active Problems:   Sepsis ([place])   Abscess of paraspinous muscles   Osteomyelitis of lumbar spine (HCC)   Abscess in epidural space of lumbar spine   Abnormal chest CT   Septic embolism (HCC)   Community acquired pneumonia   IV drug abuse ([place])   Heroin abuse ([place])   Elevated troponin  UTI (urinary tract infection)   Renal failure   Hyperglycemia   Suspected endocarditis   Chest wall abscess   Abscess of right heel   Acute osteomyelitis of lumbar spine (HCC)   Paraspinal abscess (HCC)   S/P chest tube placement (HCC)   Acute hematogenous osteomyelitis, right humerus (HCC)  Sepsis from MSSA bacteremia due to IVDA Lumbar osteomyelitis/discitis L2-4 Sacral osteomyelitis Paraspinal and epidural abscess New L4-L5 septic arthritis and progressive L5-S1 discitis/osteomyelitis Checked with IR on 09/11/2019, paraspinal abscess is too small to aspirate, further it is improving with IV nafcillin. -Seen by infectious disease.  Appreciate recommendations. -Continue IV cefazolin 2 g 3 times daily -Obtain weekly CBC/BMP, CRP ESR  TTE done on 08/01/2019 no report of vegetation Cefazolin start date 10/14- end date 10/03/19  POD #11 post I&D on 09/13/2019 of newly diagnosed right humeral osteomyelitis with abscess of right proximal humerus/ Subacromial/subdeltoid septic bursitis in the setting of bacteremia MRI done on 09/11/2019 showed extensive right humeral head osteomyelitis, a humeral abscess and septic bursitis again. -Reconsulted ID and Orthopedics, appreciate expert cares  Oxycodone 5 mg every 4 hours as needed for moderate to severe pain Avoid hypoglycemia.  Goal blood sugars between 140 and 180.  Hypokalemia K+ 3.4 repleted with po kcl 40 meq daily x 2 days Repeat labs once weekly  Resolved opiate-induced constipation DC Senokot 2 tablet twice daily Continue MiraLAX daily as needed  Abscess right ankle  Resolved  Chest wall abscess s/p operative I&D with wound vac application 9/2 and 7/12, septic emboli. Healing well.  Newly diagnosed type II diabetes with hyperglycemia Glucose 700 at admission.  No previous diagnosis  of diabetes.  No family history of diabetes.  Not obese.  A1c here 7.8%. Increase sliding scale to resistant  IV drug abuse -Continue  telephonic psychology counseling as needed Hepatitis C screening nonreactive, HIV nonreactive on 08/01/2019 He is determined to keep his sobriety.  He has refrained from using his pain medications, has not used as needed oxycodone in 3 days.  He would like to return to work and maintain his sobriety.  Anemia of chronic disease -Continue weekly CBC  Hypertension BP controlled -Continue metoprolol 12.5 mg twice daily, will need rx at discharge  Other medications -Continue trazodone as needed for insomnia, will need rx at discharge  Physical debility PT rec outpt PT C/w PT with fall precaution    MDM and disposition: The below labs and imaging reports reviewed and summarized above.  Medication management as above.   The patient was admitted with MSSA bacteremia, septic pulmonary emboli, large chest wall abscess, right shoulder septic arthritis, and right ankle abscess.      Progressing well but will need to complete his IV antibiotic infusion.  End date 10/03/2019.   DVT prophylaxis:  Subcu Lovenox daily. Code Status: FULL Family Communication: None at bedside.    Consultants:   CT surgery  Orthopedics  Infectious disease  Neurosurgery  Procedures:   9/2 MRI lumbar spine  9/2 I&D left chest wall abscess with wound vac by Dr. Servando Snare  9/4 I&D right heel abscess at bedside by Hilbert Odor, [facility]  9/11 Excision wound debridemnt, wound vac change by Dr. Prescott Gum  9/14 right shoulder bursa aspiration by Silvestre Gunner, PAC  10/12 MRI pelvis   10/13 MRI RIGHT shoulder  Antimicrobials:   Nafcillin   Culture data:   9/1 urine culture -- MSSA   9/2 blood culture x2 -- MSSA in 2/2  9/2 wound culture -- MSSA  9/2 chest wall abscess intraop culture -- MSSA  9/3 blood culture x2 -- NG  9/4 heel abscess culture -- MSSA  9/6 blood culture x2 -- NG  9/10 blood culture x2 -- NG  9/14 aspirate shoulder culture -- NG     Objective:  Vitals:   09/25/19 0500 09/25/19 0640 09/25/19 0732 09/25/19 1250  BP:  129/86 (!) 145/97 118/81  Pulse:  79 79 85  Resp:  18    Temp:  (!) 97.5 F (36.4 C) 98 F (36.7 C) 97.9 F (36.6 C)  TempSrc:  Oral Oral Oral  SpO2:  99% 100% 100%  Weight: 79.9 kg     Height:       No intake or output data in the 24 hours ending 09/25/19 1658 Filed Weights   09/18/19 0500 09/21/19 0500 09/25/19 0500  Weight: 79 kg 79 kg 79.9 kg    Exam:  . General: 38 y.o. year-old male well-developed well-nourished no acute distress.  Alert and oriented x4.   . Cardiovascular: Regular rate and rhythm no rubs or gallops no JVD aortomegaly noted.   Marland Kitchen Respiratory: Clear to auscultation no wheezes or Rales. Abdomen: Soft nontender nondistended normal bowel sounds present. Musculoskeletal: Right shoulder wound appears healed clean with no drainage.   Psychiatry: Mood is appropriate for condition and setting.  Data Reviewed: CBC: Recent Labs  Lab 09/19/19 0927  WBC 3.9*  HGB 9.0*  HCT 27.6*  MCV 90.5  PLT 703   Basic Metabolic Panel: Recent Labs  Lab 09/19/19 0927  NA 142  K 3.4*  CL 104  CO2 29  GLUCOSE 89  BUN  18  CREATININE 0.72  CALCIUM 9.0   GFR: Estimated Creatinine Clearance: 141.5 mL/min (by C-G formula based on SCr of 0.72 mg/dL). Liver Function Tests: No results for input(s): AST, ALT, ALKPHOS, BILITOT, PROT, ALBUMIN in the last 168 hours. No results for input(s): LIPASE, AMYLASE in the last 168 hours. No results for input(s): AMMONIA in the last 168 hours. Coagulation Profile: No results for input(s): INR, PROTIME in the last 168 hours. Cardiac Enzymes: No results for input(s): CKTOTAL, CKMB, CKMBINDEX, TROPONINI in the last 168 hours. BNP (last 3 results) No results for input(s): PROBNP in the last 8760 hours. HbA1C: No results for input(s): HGBA1C in the last 72 hours. CBG: Recent Labs  Lab 09/24/19 1543 09/24/19 2106 09/25/19 0705 09/25/19 1251 09/25/19 1654   GLUCAP 104* 156* 112* 149* 96   Lipid Profile: No results for input(s): CHOL, HDL, LDLCALC, TRIG, CHOLHDL, LDLDIRECT in the last 72 hours. Thyroid Function Tests: No results for input(s): TSH, T4TOTAL, FREET4, T3FREE, THYROIDAB in the last 72 hours. Anemia Panel: No results for input(s): VITAMINB12, FOLATE, FERRITIN, TIBC, IRON, RETICCTPCT in the last 72 hours. Urine analysis:    Component Value Date/Time   COLORURINE YELLOW 08/09/2019 1845   APPEARANCEUR CLEAR 08/09/2019 1845   LABSPEC 1.029 08/09/2019 1845   PHURINE 5.0 08/09/2019 1845   GLUCOSEU 50 (A) 08/09/2019 1845   HGBUR MODERATE (A) 08/09/2019 1845   BILIRUBINUR NEGATIVE 08/09/2019 1845   KETONESUR NEGATIVE 08/09/2019 1845   PROTEINUR 30 (A) 08/09/2019 1845   NITRITE NEGATIVE 08/09/2019 1845   LEUKOCYTESUR NEGATIVE 08/09/2019 1845   Sepsis Labs: '@LABRCNTIP' (procalcitonin:4,lacticidven:4)  ) No results found for this or any previous visit (from the past 240 hour(s)).    Studies: No results found.  Scheduled Meds: . diclofenac sodium  4 g Topical BID  . enoxaparin (LOVENOX) injection  40 mg Subcutaneous Q24H  . feeding supplement (ENSURE ENLIVE)  237 mL Oral BID BM  . insulin aspart  0-20 Units Subcutaneous TID WC  . insulin aspart  0-5 Units Subcutaneous QHS  . magnesium oxide  400 mg Oral BID  . Melatonin  6 mg Oral Once  . metoprolol tartrate  12.5 mg Oral BID  . ondansetron  4 mg Intravenous Once  . sodium chloride flush  10-40 mL Intracatheter Q12H    Continuous Infusions: . sodium chloride 1,000 mL (08/24/19 2247)  .  ceFAZolin (ANCEF) IV 2 g (09/25/19 1430)  . lactated ringers 10 mL/hr at 08/10/19 0845     LOS: 4 days     Kayleen Memos, MD [facility] Pager [phone]  If 7PM-7AM, please contact night-coverage [email or web address] Password [facility] 09/25/2019, 4:58 PM

## 2019-09-26 LAB — CBC
HCT: 36.5 % — ABNORMAL LOW (ref 39.0–52.0)
Hemoglobin: 11.7 g/dL — ABNORMAL LOW (ref 13.0–17.0)
MCH: 29.8 pg (ref 26.0–34.0)
MCHC: 32.1 g/dL (ref 30.0–36.0)
MCV: 92.9 fL (ref 80.0–100.0)
Platelets: 341 10*3/uL (ref 150–400)
RBC: 3.93 MIL/uL — ABNORMAL LOW (ref 4.22–5.81)
RDW: 15.2 % (ref 11.5–15.5)
WBC: 4.3 10*3/uL (ref 4.0–10.5)
nRBC: 0 % (ref 0.0–0.2)

## 2019-09-26 LAB — GLUCOSE, CAPILLARY
Glucose-Capillary: 160 mg/dL — ABNORMAL HIGH (ref 70–99)
Glucose-Capillary: 119 mg/dL — ABNORMAL HIGH (ref 70–99)
Glucose-Capillary: 133 mg/dL — ABNORMAL HIGH (ref 70–99)
Glucose-Capillary: 103 mg/dL — ABNORMAL HIGH (ref 70–99)
Glucose-Capillary: 109 mg/dL — ABNORMAL HIGH (ref 70–99)
Glucose-Capillary: 115 mg/dL — ABNORMAL HIGH (ref 70–99)
Glucose-Capillary: 138 mg/dL — ABNORMAL HIGH (ref 70–99)
Glucose-Capillary: 103 mg/dL — ABNORMAL HIGH (ref 70–99)
Glucose-Capillary: 182 mg/dL — ABNORMAL HIGH (ref 70–99)

## 2019-09-26 LAB — C-REACTIVE PROTEIN: CRP: 0.9 mg/dL (ref <1.0)

## 2019-09-26 LAB — BASIC METABOLIC PANEL
Anion gap: 11 (ref 5–15)
BUN: 24 mg/dL — ABNORMAL HIGH (ref 6–20)
CO2: 24 mmol/L (ref 22–32)
Calcium: 9.4 mg/dL (ref 8.9–10.3)
Chloride: 105 mmol/L (ref 98–111)
Creatinine, Ser: 0.88 mg/dL (ref 0.61–1.24)
GFR calc Af Amer: >60 mL/min (ref >60)
GFR calc non Af Amer: >60 mL/min (ref >60)
Glucose, Bld: 126 mg/dL — ABNORMAL HIGH (ref 70–99)
Potassium: 4.1 mmol/L (ref 3.5–5.1)
Sodium: 140 mmol/L (ref 135–145)

## 2019-09-26 LAB — MAGNESIUM: Magnesium: 2 mg/dL (ref 1.7–2.4)

## 2019-09-26 LAB — SEDIMENTATION RATE: Sed Rate: 38 mm/hr — ABNORMAL HIGH (ref 0–16)

## 2019-09-26 NOTE — Progress Notes (Signed)
PROGRESS NOTE  William [name] XYI:[id number] DOB: 1980/12/03 DOA: 07/31/2019 PCP: Patient, No Pcp Per  HPI/Recap of past 24 hours: William [name] is a 38 y.o. M with IVDA who presented with pain in the low back, left shoulder and right foot, as well as flu-like symptoms, progressing over a few days.  In the ER, imaging showed right heel abscess, left upper chest abscess and septic emboli.  Blood cultures obtained, started on empiric antibiotics, and admitted for presumptive endocarditis and for chest wall debridement.  Interim history: 9/2 patient admitted from ER to ICU, underwent debridement of chest wall abscess in the OR, placement of wound vac. MRI lumbar spine showed paraspinal abscess (2x2x4), epidural abscesses, sacral osteomyleitis and lumbar vertebral osteomyelitis/discitis Urine, blood and wound cultures from admission all growing MSSA but thankfully repeat blood cultures 9/3, 9/6, and 9/10 all without growth Ankle abscess I&D'd on 9/4 Transitioned to nafcillin on 9/7 Right shoulder bursa aspirated on 9/11  Patient defervesced and continued on nafcillin MRI pelvis and MRI right shoulder obtained on 10/11-13 showed progression of lumbar/sacral osteo, shrinkage but persistence of paraspinal abscess and newly noted osteomyelitis with abscess of the right humeral head.  Post I&D R shoulder on 09/13/19.  Cefazolin infusion start date 09/12/19;  end date 10/03/19.  09/25/19: Patient was seen and examined at his bedside this morning.  No acute events overnight.  No new complaints.  Will obtain weekly labs in the morning.  09/26/19: Patient seen and examined at bedside this morning no acute events overnight.  No new complaints.  Vital signs and labs reviewed and are improved.  Continue IV antibiotics infusions.  End date 10/03/2019.   Assessment/Plan: Principal Problem:   Staphylococcus aureus bacteremia Active Problems:   Sepsis ([place])   Abscess of paraspinous muscles  Osteomyelitis of lumbar spine (HCC)   Abscess in epidural space of lumbar spine   Abnormal chest CT   Septic embolism (HCC)   Community acquired pneumonia   IV drug abuse ([place])   Heroin abuse ([place])   Elevated troponin   UTI (urinary tract infection)   Renal failure   Hyperglycemia   Suspected endocarditis   Chest wall abscess   Abscess of right heel   Acute osteomyelitis of lumbar spine (HCC)   Paraspinal abscess (HCC)   S/P chest tube placement (HCC)   Acute hematogenous osteomyelitis, right humerus (HCC)  Sepsis from MSSA bacteremia due to IVDA Lumbar osteomyelitis/discitis L2-4 Sacral osteomyelitis Paraspinal and epidural abscess New L4-L5 septic arthritis and progressive L5-S1 discitis/osteomyelitis Checked with IR on 09/11/2019, paraspinal abscess is too small to aspirate, further it is improving with IV nafcillin. -Seen by infectious disease.  Appreciate recommendations. -Continue IV cefazolin 2 g 3 times daily -Obtain weekly CBC/BMP, CRP ESR  TTE done on 08/01/2019 no report of vegetation Cefazolin start date 10/14- end date 10/03/19  POD #11 post I&D on 09/13/2019 of newly diagnosed right humeral osteomyelitis with abscess of right proximal humerus/ Subacromial/subdeltoid septic bursitis in the setting of bacteremia MRI done on 09/11/2019 showed extensive right humeral head osteomyelitis, a humeral abscess and septic bursitis again. -Reconsulted ID and Orthopedics, appreciate expert cares  Oxycodone 5 mg every 4 hours as needed for moderate to severe pain Avoid hypoglycemia.  Goal blood sugars between 140 and 180.  Hypokalemia K+ 3.4 repleted with po kcl 40 meq daily x 2 days Repeat labs once weekly  Resolved opiate-induced constipation DC Senokot 2 tablet twice daily Continue MiraLAX daily as needed  Abscess right ankle  Resolved  Chest wall abscess s/p operative I&D with wound vac application 9/2 and 5/63, septic emboli. Healing well.  Newly diagnosed  type II diabetes with hyperglycemia Glucose 700 at admission.  No previous diagnosis of diabetes.  No family history of diabetes.  Not obese.  A1c here 7.8%. Increase sliding scale to resistant  IV drug abuse -Continue telephonic psychology counseling as needed Hepatitis C screening nonreactive, HIV nonreactive on 08/01/2019 He is determined to keep his sobriety.  He has refrained from using his pain medications, has not used as needed oxycodone in 3 days.  He would like to return to work and maintain his sobriety.  Anemia of chronic disease -Continue weekly CBC  Hypertension BP controlled -Continue metoprolol 12.5 mg twice daily, will need rx at discharge  Other medications -Continue trazodone as needed for insomnia, will need rx at discharge  Physical debility PT rec outpt PT C/w PT with fall precaution    MDM and disposition: The below labs and imaging reports reviewed and summarized above.  Medication management as above.   The patient was admitted with MSSA bacteremia, septic pulmonary emboli, large chest wall abscess, right shoulder septic arthritis, and right ankle abscess.      Progressing well but will need to complete his IV antibiotic infusion.  End date 10/03/2019.   DVT prophylaxis:  Subcu Lovenox daily. Code Status: FULL Family Communication: None at bedside.    Consultants:   CT surgery  Orthopedics  Infectious disease  Neurosurgery  Procedures:   9/2 MRI lumbar spine  9/2 I&D left chest wall abscess with wound vac by Dr. Servando Snare  9/4 I&D right heel abscess at bedside by Hilbert Odor, [facility]  9/11 Excision wound debridemnt, wound vac change by Dr. Prescott Gum  9/14 right shoulder bursa aspiration by Silvestre Gunner, PAC  10/12 MRI pelvis   10/13 MRI RIGHT shoulder  Antimicrobials:   Nafcillin   Culture data:   9/1 urine culture -- MSSA   9/2 blood culture x2 -- MSSA in 2/2  9/2 wound culture -- MSSA  9/2 chest  wall abscess intraop culture -- MSSA  9/3 blood culture x2 -- NG  9/4 heel abscess culture -- MSSA  9/6 blood culture x2 -- NG  9/10 blood culture x2 -- NG  9/14 aspirate shoulder culture -- NG     Objective: Vitals:   09/25/19 1600 09/25/19 2114 09/26/19 0914 09/26/19 1254  BP: 121/68 (!) 142/88 136/88 120/87  Pulse: 93 98 84 79  Resp: '16 18 18 18  ' Temp: 98.1 F (36.7 C) (!) 97.5 F (36.4 C) 97.9 F (36.6 C) 97.6 F (36.4 C)  TempSrc: Oral Oral Oral Tympanic  SpO2: 100% 99% 100% 100%  Weight:      Height:       No intake or output data in the 24 hours ending 09/26/19 1523 Filed Weights   09/18/19 0500 09/21/19 0500 09/25/19 0500  Weight: 79 kg 79 kg 79.9 kg    Exam:  . General: 38 y.o. year-old male well-developed well-nourished in no acute distress.  Alert and oriented x4. . Cardiovascular: Regular rate and rhythm no rubs or gallops no JVD or thyromegaly noted. Marland Kitchen Respiratory: Clear to auscultation no wheezes no rales. Abdomen: Soft nontender nondistended normal bowel sounds. Psychiatry: Mood is appropriate for condition and setting.  Data Reviewed: CBC: Recent Labs  Lab 09/26/19 0908  WBC 4.3  HGB 11.7*  HCT 36.5*  MCV 92.9  PLT 893   Basic Metabolic Panel: Recent  Labs  Lab 09/26/19 0908  NA 140  K 4.1  CL 105  CO2 24  GLUCOSE 126*  BUN 24*  CREATININE 0.88  CALCIUM 9.4  MG 2.0   GFR: Estimated Creatinine Clearance: 128.6 mL/min (by C-G formula based on SCr of 0.88 mg/dL). Liver Function Tests: No results for input(s): AST, ALT, ALKPHOS, BILITOT, PROT, ALBUMIN in the last 168 hours. No results for input(s): LIPASE, AMYLASE in the last 168 hours. No results for input(s): AMMONIA in the last 168 hours. Coagulation Profile: No results for input(s): INR, PROTIME in the last 168 hours. Cardiac Enzymes: No results for input(s): CKTOTAL, CKMB, CKMBINDEX, TROPONINI in the last 168 hours. BNP (last 3 results) No results for input(s): PROBNP  in the last 8760 hours. HbA1C: No results for input(s): HGBA1C in the last 72 hours. CBG: Recent Labs  Lab 09/25/19 1251 09/25/19 1654 09/25/19 2110 09/26/19 0912 09/26/19 1248  GLUCAP 149* 96 213* 115* 138*   Lipid Profile: No results for input(s): CHOL, HDL, LDLCALC, TRIG, CHOLHDL, LDLDIRECT in the last 72 hours. Thyroid Function Tests: No results for input(s): TSH, T4TOTAL, FREET4, T3FREE, THYROIDAB in the last 72 hours. Anemia Panel: No results for input(s): VITAMINB12, FOLATE, FERRITIN, TIBC, IRON, RETICCTPCT in the last 72 hours. Urine analysis:    Component Value Date/Time   COLORURINE YELLOW 08/09/2019 1845   APPEARANCEUR CLEAR 08/09/2019 1845   LABSPEC 1.029 08/09/2019 1845   PHURINE 5.0 08/09/2019 1845   GLUCOSEU 50 (A) 08/09/2019 1845   HGBUR MODERATE (A) 08/09/2019 1845   BILIRUBINUR NEGATIVE 08/09/2019 1845   KETONESUR NEGATIVE 08/09/2019 1845   PROTEINUR 30 (A) 08/09/2019 1845   NITRITE NEGATIVE 08/09/2019 1845   LEUKOCYTESUR NEGATIVE 08/09/2019 1845   Sepsis Labs: '@LABRCNTIP' (procalcitonin:4,lacticidven:4)  ) No results found for this or any previous visit (from the past 240 hour(s)).    Studies: No results found.  Scheduled Meds: . diclofenac sodium  4 g Topical BID  . enoxaparin (LOVENOX) injection  40 mg Subcutaneous Q24H  . feeding supplement (ENSURE ENLIVE)  237 mL Oral BID BM  . insulin aspart  0-20 Units Subcutaneous TID WC  . insulin aspart  0-5 Units Subcutaneous QHS  . magnesium oxide  400 mg Oral BID  . Melatonin  6 mg Oral Once  . metoprolol tartrate  12.5 mg Oral BID  . ondansetron  4 mg Intravenous Once  . sodium chloride flush  10-40 mL Intracatheter Q12H    Continuous Infusions: . sodium chloride 1,000 mL (08/24/19 2247)  .  ceFAZolin (ANCEF) IV 2 g (09/26/19 1437)  . lactated ringers 10 mL/hr at 08/10/19 0845     LOS: 80 days     Kayleen Memos, MD [facility] Pager [phone]  If 7PM-7AM, please contact  night-coverage [email or web address] Password [facility] 09/26/2019, 3:23 PM

## 2019-09-27 LAB — GLUCOSE, CAPILLARY
Glucose-Capillary: 93 mg/dL (ref 70–99)
Glucose-Capillary: 152 mg/dL — ABNORMAL HIGH (ref 70–99)
Glucose-Capillary: 162 mg/dL — ABNORMAL HIGH (ref 70–99)
Glucose-Capillary: 202 mg/dL — ABNORMAL HIGH (ref 70–99)

## 2019-09-27 NOTE — Plan of Care (Signed)
  Problem: Activity: Goal: Risk for activity intolerance will decrease Outcome: Progressing   

## 2019-09-27 NOTE — Progress Notes (Signed)
PROGRESS NOTE  William [name] NOB:[id number] DOB: [birth year] DOA: 07/31/2019 PCP: Patient, No Pcp Per  HPI/Recap of past 24 hours: William [name] is a 38 y.o. M with IVDA who presented with pain in the William back, left shoulder and right foot, as well as flu-like symptoms, progressing over a few days.  In the ER, imaging showed right heel abscess, left upper chest abscess and septic emboli.  Blood cultures obtained, started on empiric antibiotics, and admitted for presumptive endocarditis and for chest wall debridement.  Interim history: 9/2 patient admitted from ER to ICU, underwent debridement of chest wall abscess in the OR, placement of wound vac. MRI lumbar spine showed paraspinal abscess (2x2x4), epidural abscesses, sacral osteomyleitis and lumbar vertebral osteomyelitis/discitis Urine, blood and wound cultures from admission all growing MSSA but thankfully repeat blood cultures 9/3, 9/6, and 9/10 all without growth Ankle abscess I&D'd on 9/4 Transitioned to nafcillin on 9/7 Right shoulder bursa aspirated on 9/11  Patient defervesced and continued on nafcillin MRI pelvis and MRI right shoulder obtained on 10/11-13 showed progression of lumbar/sacral osteo, shrinkage but persistence of paraspinal abscess and newly noted osteomyelitis with abscess of the right humeral head.  Post I&D R shoulder on 09/13/19.  Cefazolin infusion start date 09/12/19;  end date 10/03/19.    09/27/19: Patient was seen and examined at his bedside this morning.  No acute events overnight.  Personally reviewed recent labs and vital signs.  They are all stable.  IV antibiotics infusions.  End date 10/03/19.   Assessment/Plan: Principal Problem:   Staphylococcus aureus bacteremia Active Problems:   Sepsis (William [name])   Abscess of paraspinous muscles   Osteomyelitis of lumbar spine (HCC)   Abscess in epidural space of lumbar spine   Abnormal chest CT   Septic embolism (HCC)   Community acquired pneumonia   IV  drug abuse (William [name])   Heroin abuse (William [name])   Elevated troponin   UTI (urinary tract infection)   Renal failure   Hyperglycemia   Suspected endocarditis   Chest wall abscess   Abscess of right heel   Acute osteomyelitis of lumbar spine (HCC)   Paraspinal abscess (HCC)   S/P chest tube placement (HCC)   Acute hematogenous osteomyelitis, right humerus (HCC)  Sepsis from MSSA bacteremia due to IVDA Lumbar osteomyelitis/discitis L2-4 Sacral osteomyelitis Paraspinal and epidural abscess New L4-L5 septic arthritis and progressive L5-S1 discitis/osteomyelitis Checked with IR on 09/11/2019, paraspinal abscess is too small to aspirate, further it is improving with IV nafcillin. -Seen by infectious disease.  Appreciate recommendations. -Continue IV cefazolin 2 g 3 times daily -Obtain weekly CBC/BMP, CRP ESR  TTE done on 08/01/2019 no report of vegetation Cefazolin start date 10/14- end date 10/03/19 Sed rate 38 from 70 CRP normal 0.9 No leukocytosis  POD #14 post I&D on 09/13/2019 of newly diagnosed right humeral osteomyelitis with abscess of right proximal humerus/ Subacromial/subdeltoid septic bursitis in the setting of bacteremia MRI done on 09/11/2019 showed extensive right humeral head osteomyelitis, a humeral abscess and septic bursitis again. -Healing well, no acute issues.  Resolved post repletion: Hypokalemia K+ 3.4>> 4.1 on 09/26/19  Resolved opiate-induced constipation DC Senokot 2 tablet twice daily Continue MiraLAX daily as needed  Abscess right ankle  Resolved  Chest wall abscess s/p operative I&D with wound vac application 9/2 and 9/47, septic emboli. Healing well.  Newly diagnosed type II diabetes with hyperglycemia Glucose 700 at admission.  No previous diagnosis of diabetes.  No family history of diabetes.  Not obese.  A1c here 7.8%. Increase sliding scale to resistant Continue insulin sliding scale  History of IV drug abuse -Continue telephonic psychology  counseling as needed Hepatitis C screening nonreactive, HIV nonreactive on 08/01/2019 He is determined to keep his sobriety.  He has refrained from using his pain medications, has not used as needed oxycodone for quite some time.  He would like to return to work and maintain his sobriety.  Resolving anemia of chronic disease -Hemoglobin 11.7 from 9.0 a week ago without blood transfusion.  Hypertension BP controlled -Continue metoprolol 12.5 mg twice daily, will need rx at discharge  Other medications -Continue trazodone as needed for insomnia, will need rx at discharge  Physical debility PT rec outpt PT C/w PT with fall precaution    MDM and disposition: The below labs and imaging reports reviewed and summarized above.  Medication management as above.   The patient was admitted with MSSA bacteremia, septic pulmonary emboli, large chest wall abscess, right shoulder septic arthritis, and right ankle abscess.      Progressing well but will need to complete his IV antibiotic infusion.  End date 10/03/2019.   DVT prophylaxis:  Subcu Lovenox daily. Code Status: FULL Family Communication: None at bedside.    Consultants:   CT surgery  Orthopedics  Infectious disease  Neurosurgery  Procedures:   9/2 MRI lumbar spine  9/2 I&D left chest wall abscess with wound vac by Dr. Servando Snare  9/4 I&D right heel abscess at bedside by Hilbert Odor, [facility]  9/11 Excision wound debridemnt, wound vac change by Dr. Prescott Gum  9/14 right shoulder bursa aspiration by Silvestre Gunner, PAC  10/12 MRI pelvis   10/13 MRI RIGHT shoulder  Antimicrobials:   Nafcillin   Culture data:   9/1 urine culture -- MSSA   9/2 blood culture x2 -- MSSA in 2/2  9/2 wound culture -- MSSA  9/2 chest wall abscess intraop culture -- MSSA  9/3 blood culture x2 -- NG  9/4 heel abscess culture -- MSSA  9/6 blood culture x2 -- NG  9/10 blood culture x2 -- NG  9/14 aspirate  shoulder culture -- NG     Objective: Vitals:   09/26/19 1712 09/26/19 1925 09/27/19 0722 09/27/19 1152  BP: 120/82 (!) 145/96 132/86 (!) 138/92  Pulse: 79 80 70 74  Resp: '18 15 18 18  ' Temp: 97.8 F (36.6 C) 97.6 F (36.4 C) 97.9 F (36.6 C) 97.8 F (36.6 C)  TempSrc: Oral Oral Oral Oral  SpO2: 100% 100% 100% 100%  Weight:      Height:        Intake/Output Summary (Last 24 hours) at 09/27/2019 1510 Last data filed at 09/27/2019 0700 Gross per 24 hour  Intake -  Output 700 ml  Net -700 ml   Filed Weights   09/18/19 0500 09/21/19 0500 09/25/19 0500  Weight: 79 kg 79 kg 79.9 kg    Exam:  . General: 38 y.o. year-old male well-developed well-nourished in no acute distress.  Oriented x4.   . Cardiovascular: Regular rate and rhythm no rubs or gallops no JVD or thyromegaly.   Marland Kitchen Respiratory: Clear to Auscultation No Wheezes No Rales. Abdomen: Soft nontender nondistended normal bowel sounds present. Psychiatry: Mood is appropriate for condition and setting.  Data Reviewed: CBC: Recent Labs  Lab 09/26/19 0908  WBC 4.3  HGB 11.7*  HCT 36.5*  MCV 92.9  PLT 409   Basic Metabolic Panel: Recent Labs  Lab 09/26/19 0908  NA 140  K  4.1  CL 105  CO2 24  GLUCOSE 126*  BUN 24*  CREATININE 0.88  CALCIUM 9.4  MG 2.0   GFR: Estimated Creatinine Clearance: 128.6 mL/min (by C-G formula based on SCr of 0.88 mg/dL). Liver Function Tests: No results for input(s): AST, ALT, ALKPHOS, BILITOT, PROT, ALBUMIN in the last 168 hours. No results for input(s): LIPASE, AMYLASE in the last 168 hours. No results for input(s): AMMONIA in the last 168 hours. Coagulation Profile: No results for input(s): INR, PROTIME in the last 168 hours. Cardiac Enzymes: No results for input(s): CKTOTAL, CKMB, CKMBINDEX, TROPONINI in the last 168 hours. BNP (last 3 results) No results for input(s): PROBNP in the last 8760 hours. HbA1C: No results for input(s): HGBA1C in the last 72 hours. CBG:  Recent Labs  Lab 09/26/19 1248 09/26/19 1653 09/26/19 2151 09/27/19 0716 09/27/19 1149  GLUCAP 138* 103* 182* 93 152*   Lipid Profile: No results for input(s): CHOL, HDL, LDLCALC, TRIG, CHOLHDL, LDLDIRECT in the last 72 hours. Thyroid Function Tests: No results for input(s): TSH, T4TOTAL, FREET4, T3FREE, THYROIDAB in the last 72 hours. Anemia Panel: No results for input(s): VITAMINB12, FOLATE, FERRITIN, TIBC, IRON, RETICCTPCT in the last 72 hours. Urine analysis:    Component Value Date/Time   COLORURINE YELLOW 08/09/2019 1845   APPEARANCEUR CLEAR 08/09/2019 1845   LABSPEC 1.029 08/09/2019 1845   PHURINE 5.0 08/09/2019 1845   GLUCOSEU 50 (A) 08/09/2019 1845   HGBUR MODERATE (A) 08/09/2019 1845   BILIRUBINUR NEGATIVE 08/09/2019 1845   KETONESUR NEGATIVE 08/09/2019 1845   PROTEINUR 30 (A) 08/09/2019 1845   NITRITE NEGATIVE 08/09/2019 1845   LEUKOCYTESUR NEGATIVE 08/09/2019 1845   Sepsis Labs: '@LABRCNTIP' (procalcitonin:4,lacticidven:4)  ) No results found for this or any previous visit (from the past 240 hour(s)).    Studies: No results found.  Scheduled Meds: . enoxaparin (LOVENOX) injection  40 mg Subcutaneous Q24H  . feeding supplement (ENSURE ENLIVE)  237 mL Oral BID BM  . insulin aspart  0-20 Units Subcutaneous TID WC  . insulin aspart  0-5 Units Subcutaneous QHS  . magnesium oxide  400 mg Oral BID  . Melatonin  6 mg Oral Once  . metoprolol tartrate  12.5 mg Oral BID  . ondansetron  4 mg Intravenous Once  . sodium chloride flush  10-40 mL Intracatheter Q12H    Continuous Infusions: . sodium chloride 1,000 mL (08/24/19 2247)  .  ceFAZolin (ANCEF) IV 2 g (09/27/19 0827)  . lactated ringers 10 mL/hr at 08/10/19 0845     LOS: 22 days     Kayleen Memos, MD [facility] Pager [phone]  If 7PM-7AM, please contact night-coverage [email or web address] Password [email or web address] 09/27/2019, 3:10 PM

## 2019-09-27 NOTE — Progress Notes (Signed)
Patient has been refusing Lovenox SQ, neither does he uses the SCD.  Education given for the need for VTE prevention. Pt demonstrated good understanding.

## 2019-09-28 LAB — GLUCOSE, CAPILLARY
Glucose-Capillary: 90 mg/dL (ref 70–99)
Glucose-Capillary: 132 mg/dL — ABNORMAL HIGH (ref 70–99)
Glucose-Capillary: 146 mg/dL — ABNORMAL HIGH (ref 70–99)
Glucose-Capillary: 137 mg/dL — ABNORMAL HIGH (ref 70–99)
Glucose-Capillary: 108 mg/dL — ABNORMAL HIGH (ref 70–99)

## 2019-09-28 NOTE — Progress Notes (Signed)
PROGRESS NOTE  DAMYON MULLANE LHT:[id number] DOB: 12-31-1980 DOA: 07/31/2019 PCP: Patient, No Pcp Per  HPI/Recap of past 24 hours: Mr. Chain is a 38 y.o. M with IVDA who presented with pain in the low back, left shoulder and right foot, as well as flu-like symptoms, progressing over a few days.  In the ER, imaging showed right heel abscess, left upper chest abscess and septic emboli.  Blood cultures obtained, started on empiric antibiotics, and admitted for presumptive endocarditis and for chest wall debridement.  Interim history: 9/2 patient admitted from ER to ICU, underwent debridement of chest wall abscess in the OR, placement of wound vac. MRI lumbar spine showed paraspinal abscess (2x2x4), epidural abscesses, sacral osteomyleitis and lumbar vertebral osteomyelitis/discitis Urine, blood and wound cultures from admission all growing MSSA but thankfully repeat blood cultures 9/3, 9/6, and 9/10 all without growth Ankle abscess I&D'd on 9/4 Transitioned to nafcillin on 9/7 Right shoulder bursa aspirated on 9/11  Patient defervesced and continued on nafcillin MRI pelvis and MRI right shoulder obtained on 10/11-13 showed progression of lumbar/sacral osteo, shrinkage but persistence of paraspinal abscess and newly noted osteomyelitis with abscess of the right humeral head.  Post I&D R shoulder on 09/13/19.  Cefazolin infusion start date 09/12/19;  end date 10/03/19.    09/27/19: Patient was seen and examined at his bedside this morning.  No acute events overnight.  Personally reviewed recent labs and vital signs.  They are all stable.  IV antibiotics infusions.  End date 10/03/19.  09/28/19: Patient was seen and examined at bedside this morning.  No acute events overnight.  No new complaints.  IV antibiotics infusion.  End date 10/03/2019.   Assessment/Plan: Principal Problem:   Staphylococcus aureus bacteremia Active Problems:   Sepsis ([place])   Abscess of paraspinous muscles  Osteomyelitis of lumbar spine (HCC)   Abscess in epidural space of lumbar spine   Abnormal chest CT   Septic embolism (HCC)   Community acquired pneumonia   IV drug abuse ([place])   Heroin abuse ([place])   Elevated troponin   UTI (urinary tract infection)   Renal failure   Hyperglycemia   Suspected endocarditis   Chest wall abscess   Abscess of right heel   Acute osteomyelitis of lumbar spine (HCC)   Paraspinal abscess (HCC)   S/P chest tube placement (HCC)   Acute hematogenous osteomyelitis, right humerus (HCC)  Sepsis from MSSA bacteremia due to IVDA Lumbar osteomyelitis/discitis L2-4 Sacral osteomyelitis Paraspinal and epidural abscess New L4-L5 septic arthritis and progressive L5-S1 discitis/osteomyelitis Checked with IR on 09/11/2019, paraspinal abscess is too small to aspirate, further it is improving with IV nafcillin. -Seen by infectious disease.  Appreciate recommendations. -Continue IV cefazolin 2 g 3 times daily -Obtain weekly CBC/BMP, CRP ESR  TTE done on 08/01/2019 no report of vegetation Cefazolin start date 10/14- end date 10/03/19 Sed rate 38 from 70 CRP normal 0.9 No leukocytosis  POD #15 post I&D on 09/13/2019 of newly diagnosed right humeral osteomyelitis with abscess of right proximal humerus/ Subacromial/subdeltoid septic bursitis in the setting of bacteremia MRI done on 09/11/2019 showed extensive right humeral head osteomyelitis, a humeral abscess and septic bursitis again. -Healing well, no acute issues.  Resolved post repletion: Hypokalemia K+ 3.4>> 4.1 on 09/26/19  Resolved opiate-induced constipation DC Senokot 2 tablet twice daily Continue MiraLAX daily as needed  Abscess right ankle  Resolved  Chest wall abscess s/p operative I&D with wound vac application 9/2 and 5/72, septic emboli. Healing well.  Newly diagnosed type II diabetes with hyperglycemia Glucose 700 at admission.  No previous diagnosis of diabetes.  No family history of diabetes.   Not obese.  A1c here 7.8%. Increase sliding scale to resistant Continue insulin sliding scale  History of IV drug abuse -Continue telephonic psychology counseling as needed Hepatitis C screening nonreactive, HIV nonreactive on 08/01/2019 He is determined to keep his sobriety.  He has refrained from using his pain medications, has not used as needed oxycodone for quite some time.  He would like to return to work and maintain his sobriety.  Resolving anemia of chronic disease -Hemoglobin 11.7 from 9.0 a week ago without blood transfusion.  Hypertension BP controlled -Continue metoprolol 12.5 mg twice daily, will need rx at discharge  Other medications -Continue trazodone as needed for insomnia, will need rx at discharge  Physical debility PT rec outpt PT C/w PT with fall precaution    MDM and disposition: The below labs and imaging reports reviewed and summarized above.  Medication management as above.   The patient was admitted with MSSA bacteremia, septic pulmonary emboli, large chest wall abscess, right shoulder septic arthritis, and right ankle abscess.      Progressing well but will need to complete his IV antibiotic infusion.  End date 10/03/2019.   DVT prophylaxis:  Subcu Lovenox daily. Code Status: FULL Family Communication: None at bedside.    Consultants:   CT surgery  Orthopedics  Infectious disease  Neurosurgery  Procedures:   9/2 MRI lumbar spine  9/2 I&D left chest wall abscess with wound vac by Dr. Servando Snare  9/4 I&D right heel abscess at bedside by Hilbert Odor, [facility]  9/11 Excision wound debridemnt, wound vac change by Dr. Prescott Gum  9/14 right shoulder bursa aspiration by Silvestre Gunner, PAC  10/12 MRI pelvis   10/13 MRI RIGHT shoulder  Antimicrobials:   Nafcillin   Culture data:   9/1 urine culture -- MSSA   9/2 blood culture x2 -- MSSA in 2/2  9/2 wound culture -- MSSA  9/2 chest wall abscess intraop culture  -- MSSA  9/3 blood culture x2 -- NG  9/4 heel abscess culture -- MSSA  9/6 blood culture x2 -- NG  9/10 blood culture x2 -- NG  9/14 aspirate shoulder culture -- NG     Objective: Vitals:   09/27/19 2300 09/28/19 0709 09/28/19 0953 09/28/19 1149  BP: (!) 125/59 118/85 (!) 140/94 (!) 132/95  Pulse: 79 72 91 81  Resp: '18  20 18  ' Temp: 97.6 F (36.4 C) 97.6 F (36.4 C) 97.8 F (36.6 C) 97.9 F (36.6 C)  TempSrc: Oral Oral Oral Oral  SpO2: 100% 100% 100% 100%  Weight:      Height:        Intake/Output Summary (Last 24 hours) at 09/28/2019 1334 Last data filed at 09/28/2019 1237 Gross per 24 hour  Intake 250 ml  Output -  Net 250 ml   Filed Weights   09/18/19 0500 09/21/19 0500 09/25/19 0500  Weight: 79 kg 79 kg 79.9 kg    Exam: No change in the physical exam as of 09/28/19  . General: 38 y.o. year-old male well-developed well-nourished in no acute distress.  Oriented x4.   . Cardiovascular: Regular rate and rhythm no rubs or gallops no JVD or thyromegaly.   Marland Kitchen Respiratory: Clear to Auscultation No Wheezes No Rales. Abdomen: Soft nontender nondistended normal bowel sounds present. Psychiatry: Mood is appropriate for condition and setting.  Data Reviewed: CBC:  Recent Labs  Lab 09/26/19 0908  WBC 4.3  HGB 11.7*  HCT 36.5*  MCV 92.9  PLT 413   Basic Metabolic Panel: Recent Labs  Lab 09/26/19 0908  NA 140  K 4.1  CL 105  CO2 24  GLUCOSE 126*  BUN 24*  CREATININE 0.88  CALCIUM 9.4  MG 2.0   GFR: Estimated Creatinine Clearance: 128.6 mL/min (by C-G formula based on SCr of 0.88 mg/dL). Liver Function Tests: No results for input(s): AST, ALT, ALKPHOS, BILITOT, PROT, ALBUMIN in the last 168 hours. No results for input(s): LIPASE, AMYLASE in the last 168 hours. No results for input(s): AMMONIA in the last 168 hours. Coagulation Profile: No results for input(s): INR, PROTIME in the last 168 hours. Cardiac Enzymes: No results for input(s):  CKTOTAL, CKMB, CKMBINDEX, TROPONINI in the last 168 hours. BNP (last 3 results) No results for input(s): PROBNP in the last 8760 hours. HbA1C: No results for input(s): HGBA1C in the last 72 hours. CBG: Recent Labs  Lab 09/27/19 1633 09/27/19 2124 09/28/19 0609 09/28/19 0935 09/28/19 1149  GLUCAP 162* 202* 90 132* 146*   Lipid Profile: No results for input(s): CHOL, HDL, LDLCALC, TRIG, CHOLHDL, LDLDIRECT in the last 72 hours. Thyroid Function Tests: No results for input(s): TSH, T4TOTAL, FREET4, T3FREE, THYROIDAB in the last 72 hours. Anemia Panel: No results for input(s): VITAMINB12, FOLATE, FERRITIN, TIBC, IRON, RETICCTPCT in the last 72 hours. Urine analysis:    Component Value Date/Time   COLORURINE YELLOW 08/09/2019 1845   APPEARANCEUR CLEAR 08/09/2019 1845   LABSPEC 1.029 08/09/2019 1845   PHURINE 5.0 08/09/2019 1845   GLUCOSEU 50 (A) 08/09/2019 1845   HGBUR MODERATE (A) 08/09/2019 1845   BILIRUBINUR NEGATIVE 08/09/2019 1845   KETONESUR NEGATIVE 08/09/2019 1845   PROTEINUR 30 (A) 08/09/2019 1845   NITRITE NEGATIVE 08/09/2019 1845   LEUKOCYTESUR NEGATIVE 08/09/2019 1845   Sepsis Labs: '@LABRCNTIP' (procalcitonin:4,lacticidven:4)  ) No results found for this or any previous visit (from the past 240 hour(s)).    Studies: No results found.  Scheduled Meds: . enoxaparin (LOVENOX) injection  40 mg Subcutaneous Q24H  . feeding supplement (ENSURE ENLIVE)  237 mL Oral BID BM  . insulin aspart  0-20 Units Subcutaneous TID WC  . insulin aspart  0-5 Units Subcutaneous QHS  . magnesium oxide  400 mg Oral BID  . Melatonin  6 mg Oral Once  . metoprolol tartrate  12.5 mg Oral BID  . ondansetron  4 mg Intravenous Once  . sodium chloride flush  10-40 mL Intracatheter Q12H    Continuous Infusions: . sodium chloride 1,000 mL (08/24/19 2247)  .  ceFAZolin (ANCEF) IV 2 g (09/28/19 0706)  . lactated ringers 10 mL/hr at 08/10/19 0845     LOS: 76 days     Kayleen Memos,  MD [facility] Pager [phone]  If 7PM-7AM, please contact night-coverage [email or web address] Password [email or web address] 09/28/2019, 1:34 PM

## 2019-09-29 LAB — GLUCOSE, CAPILLARY
Glucose-Capillary: 98 mg/dL (ref 70–99)
Glucose-Capillary: 219 mg/dL — ABNORMAL HIGH (ref 70–99)
Glucose-Capillary: 85 mg/dL (ref 70–99)
Glucose-Capillary: 176 mg/dL — ABNORMAL HIGH (ref 70–99)

## 2019-09-29 NOTE — Plan of Care (Signed)
  Problem: Clinical Measurements: Goal: Cardiovascular complication will be avoided Outcome: Progressing   Problem: Clinical Measurements: Goal: Ability to maintain clinical measurements within normal limits will improve Outcome: Progressing   Problem: Health Behavior/Discharge Planning: Goal: Ability to manage health-related needs will improve Outcome: Progressing

## 2019-09-29 NOTE — Progress Notes (Signed)
CSW is following for SNF. The patient will finish antibiotics on 10/03/2019.   CSW will continue to follow for disposition planning.   Domenic Schwab, MSW, [place] [facility]  [phone]

## 2019-09-29 NOTE — Progress Notes (Signed)
PROGRESS NOTE  KJUAN SEIPP YQM:[id number] DOB: Mar 21, 1981 DOA: 07/31/2019 PCP: Patient, No Pcp Per  HPI/Recap of past 24 hours: Mr. Mapel is a 38 y.o. M with IVDA who presented with pain in the low back, left shoulder and right foot, as well as flu-like symptoms, progressing over a few days.  In the ER, imaging showed right heel abscess, left upper chest abscess and septic emboli.  Blood cultures obtained, started on empiric antibiotics, and admitted for presumptive endocarditis and for chest wall debridement.  Interim history: 9/2 patient admitted from ER to ICU, underwent debridement of chest wall abscess in the OR, placement of wound vac. MRI lumbar spine showed paraspinal abscess (2x2x4), epidural abscesses, sacral osteomyleitis and lumbar vertebral osteomyelitis/discitis Urine, blood and wound cultures from admission all growing MSSA but thankfully repeat blood cultures 9/3, 9/6, and 9/10 all without growth Ankle abscess I&D'd on 9/4 Transitioned to nafcillin on 9/7 Right shoulder bursa aspirated on 9/11  Patient defervesced and continued on nafcillin MRI pelvis and MRI right shoulder obtained on 10/11-13 showed progression of lumbar/sacral osteo, shrinkage but persistence of paraspinal abscess and newly noted osteomyelitis with abscess of the right humeral head.  Post I&D R shoulder on 09/13/19.  Cefazolin infusion start date 09/12/19;  end date 10/03/19.  09/29/19: Patient was seen and examined at bedside.  No acute events overnight.  No new changes.  He has no new complaints.  IV antibiotics infusion.  End date 10/03/2019.   Assessment/Plan: Principal Problem:   Staphylococcus aureus bacteremia Active Problems:   Sepsis ([place])   Abscess of paraspinous muscles   Osteomyelitis of lumbar spine (HCC)   Abscess in epidural space of lumbar spine   Abnormal chest CT   Septic embolism (HCC)   Community acquired pneumonia   IV drug abuse ([place])   Heroin abuse ([place])   Elevated  troponin   UTI (urinary tract infection)   Renal failure   Hyperglycemia   Suspected endocarditis   Chest wall abscess   Abscess of right heel   Acute osteomyelitis of lumbar spine (HCC)   Paraspinal abscess (HCC)   S/P chest tube placement (HCC)   Acute hematogenous osteomyelitis, right humerus (HCC)  Sepsis from MSSA bacteremia due to IVDA Lumbar osteomyelitis/discitis L2-4 Sacral osteomyelitis Paraspinal and epidural abscess New L4-L5 septic arthritis and progressive L5-S1 discitis/osteomyelitis Checked with IR on 09/11/2019, paraspinal abscess is too small to aspirate, further it is improving with IV nafcillin. -Seen by infectious disease.  Appreciate recommendations. -Continue IV cefazolin 2 g 3 times daily -Obtain weekly CBC/BMP, CRP ESR  TTE done on 08/01/2019 no report of vegetation Cefazolin start date 10/14- end date 10/03/19 Sed rate 38 from 70 CRP normal 0.9 No leukocytosis  POD #15 post I&D on 09/13/2019 of newly diagnosed right humeral osteomyelitis with abscess of right proximal humerus/ Subacromial/subdeltoid septic bursitis in the setting of bacteremia MRI done on 09/11/2019 showed extensive right humeral head osteomyelitis, a humeral abscess and septic bursitis again. -Healing well, no acute issues.  Resolved post repletion: Hypokalemia K+ 3.4>> 4.1 on 09/26/19  Resolved opiate-induced constipation DC Senokot 2 tablet twice daily Continue MiraLAX daily as needed  Abscess right ankle  Resolved  Chest wall abscess s/p operative I&D with wound vac application 9/2 and 5/28, septic emboli. Healing well.  Newly diagnosed type II diabetes with hyperglycemia Glucose 700 at admission.  No previous diagnosis of diabetes.  No family history of diabetes.  Not obese.  A1c here 7.8%. Increase sliding scale to  resistant Continue insulin sliding scale  History of IV drug abuse -Continue telephonic psychology counseling as needed Hepatitis C screening nonreactive,  HIV nonreactive on 08/01/2019 He is determined to keep his sobriety.  He has refrained from using his pain medications, has not used as needed oxycodone for quite some time.  He would like to return to work and maintain his sobriety.  Resolving anemia of chronic disease -Hemoglobin 11.7 from 9.0 a week ago without blood transfusion.  Hypertension BP controlled -Continue metoprolol 12.5 mg twice daily, will need rx at discharge  Other medications -Continue trazodone as needed for insomnia, will need rx at discharge  Physical debility PT rec outpt PT C/w PT with fall precaution    MDM and disposition: The below labs and imaging reports reviewed and summarized above.  Medication management as above.   The patient was admitted with MSSA bacteremia, septic pulmonary emboli, large chest wall abscess, right shoulder septic arthritis, and right ankle abscess.      Progressing well but will need to complete his IV antibiotic infusion.  End date 10/03/2019.   DVT prophylaxis:  Subcu Lovenox daily. Code Status: FULL Family Communication: None at bedside.    Consultants:   CT surgery  Orthopedics  Infectious disease  Neurosurgery  Procedures:   9/2 MRI lumbar spine  9/2 I&D left chest wall abscess with wound vac by Dr. Servando Snare  9/4 I&D right heel abscess at bedside by Hilbert Odor, [facility]  9/11 Excision wound debridemnt, wound vac change by Dr. Prescott Gum  9/14 right shoulder bursa aspiration by Silvestre Gunner, PAC  10/12 MRI pelvis   10/13 MRI RIGHT shoulder  Antimicrobials:   Nafcillin   Culture data:   9/1 urine culture -- MSSA   9/2 blood culture x2 -- MSSA in 2/2  9/2 wound culture -- MSSA  9/2 chest wall abscess intraop culture -- MSSA  9/3 blood culture x2 -- NG  9/4 heel abscess culture -- MSSA  9/6 blood culture x2 -- NG  9/10 blood culture x2 -- NG  9/14 aspirate shoulder culture -- NG     Objective: Vitals:    09/28/19 0953 09/28/19 1149 09/28/19 1552 09/28/19 2200  BP: (!) 140/94 (!) 132/95 128/81 128/83  Pulse: 91 81 83 80  Resp: '20 18 18 18  ' Temp: 97.8 F (36.6 C) 97.9 F (36.6 C)  98.7 F (37.1 C)  TempSrc: Oral Oral  Oral  SpO2: 100% 100% 100% 100%  Weight:      Height:        Intake/Output Summary (Last 24 hours) at 09/29/2019 0937 Last data filed at 09/29/2019 0600 Gross per 24 hour  Intake 580 ml  Output -  Net 580 ml   Filed Weights   09/18/19 0500 09/21/19 0500 09/25/19 0500  Weight: 79 kg 79 kg 79.9 kg    Exam: No change in the physical exam as of 09/28/19  . General: 38 y.o. year-old male well-developed well-nourished in no acute distress.  Alert and oriented x4.   . Cardiovascular: Regular rate and rhythm no rubs or gallops no JVD or thyromegaly noted.   Marland Kitchen Respiratory: Clear to auscultation no wheezes no Rales.  Good inspiratory effort. Abdomen: Soft nontender nondistended normal bowel sounds present. Psychiatry: Mood is appropriate for condition and setting.   Data Reviewed: CBC: Recent Labs  Lab 09/26/19 0908  WBC 4.3  HGB 11.7*  HCT 36.5*  MCV 92.9  PLT 633   Basic Metabolic Panel: Recent Labs  Lab  09/26/19 0908  NA 140  K 4.1  CL 105  CO2 24  GLUCOSE 126*  BUN 24*  CREATININE 0.88  CALCIUM 9.4  MG 2.0   GFR: Estimated Creatinine Clearance: 128.6 mL/min (by C-G formula based on SCr of 0.88 mg/dL). Liver Function Tests: No results for input(s): AST, ALT, ALKPHOS, BILITOT, PROT, ALBUMIN in the last 168 hours. No results for input(s): LIPASE, AMYLASE in the last 168 hours. No results for input(s): AMMONIA in the last 168 hours. Coagulation Profile: No results for input(s): INR, PROTIME in the last 168 hours. Cardiac Enzymes: No results for input(s): CKTOTAL, CKMB, CKMBINDEX, TROPONINI in the last 168 hours. BNP (last 3 results) No results for input(s): PROBNP in the last 8760 hours. HbA1C: No results for input(s): HGBA1C in the last 72  hours. CBG: Recent Labs  Lab 09/28/19 0935 09/28/19 1149 09/28/19 1825 09/28/19 2016 09/29/19 0626  GLUCAP 132* 146* 137* 108* 98   Lipid Profile: No results for input(s): CHOL, HDL, LDLCALC, TRIG, CHOLHDL, LDLDIRECT in the last 72 hours. Thyroid Function Tests: No results for input(s): TSH, T4TOTAL, FREET4, T3FREE, THYROIDAB in the last 72 hours. Anemia Panel: No results for input(s): VITAMINB12, FOLATE, FERRITIN, TIBC, IRON, RETICCTPCT in the last 72 hours. Urine analysis:    Component Value Date/Time   COLORURINE YELLOW 08/09/2019 1845   APPEARANCEUR CLEAR 08/09/2019 1845   LABSPEC 1.029 08/09/2019 1845   PHURINE 5.0 08/09/2019 1845   GLUCOSEU 50 (A) 08/09/2019 1845   HGBUR MODERATE (A) 08/09/2019 1845   BILIRUBINUR NEGATIVE 08/09/2019 1845   KETONESUR NEGATIVE 08/09/2019 1845   PROTEINUR 30 (A) 08/09/2019 1845   NITRITE NEGATIVE 08/09/2019 1845   LEUKOCYTESUR NEGATIVE 08/09/2019 1845   Sepsis Labs: '@LABRCNTIP' (procalcitonin:4,lacticidven:4)  ) No results found for this or any previous visit (from the past 240 hour(s)).    Studies: No results found.  Scheduled Meds: . feeding supplement (ENSURE ENLIVE)  237 mL Oral BID BM  . insulin aspart  0-20 Units Subcutaneous TID WC  . insulin aspart  0-5 Units Subcutaneous QHS  . magnesium oxide  400 mg Oral BID  . Melatonin  6 mg Oral Once  . metoprolol tartrate  12.5 mg Oral BID  . ondansetron  4 mg Intravenous Once  . sodium chloride flush  10-40 mL Intracatheter Q12H    Continuous Infusions: . sodium chloride 1,000 mL (08/24/19 2247)  .  ceFAZolin (ANCEF) IV 2 g (09/29/19 0734)  . lactated ringers 10 mL/hr at 08/10/19 0845     LOS: 69 days     Kayleen Memos, MD [facility] Pager [phone]  If 7PM-7AM, please contact night-coverage [email or web address] Password [facility] 09/29/2019, 9:37 AM

## 2019-09-30 LAB — GLUCOSE, CAPILLARY
Glucose-Capillary: 129 mg/dL — ABNORMAL HIGH (ref 70–99)
Glucose-Capillary: 110 mg/dL — ABNORMAL HIGH (ref 70–99)

## 2019-09-30 NOTE — Progress Notes (Signed)
PROGRESS NOTE  BLEU MINERD TZG:[id number] DOB: Nov 29, 1981 DOA: 07/31/2019 PCP: Patient, No Pcp Per  HPI/Recap of past 24 hours: Mr. William [name] is a 38 y.o. M with IVDA who presented with pain in the low back, left shoulder and right foot, as well as flu-like symptoms, progressing over a few days.  In the ER, imaging showed right heel abscess, left upper chest abscess and septic emboli.  Blood cultures obtained, started on empiric antibiotics, and admitted for presumptive endocarditis and for chest wall debridement.  Interim history: 9/2 patient admitted from ER to ICU, underwent debridement of chest wall abscess in the OR, placement of wound vac. MRI lumbar spine showed paraspinal abscess (2x2x4), epidural abscesses, sacral osteomyleitis and lumbar vertebral osteomyelitis/discitis Urine, blood and wound cultures from admission all growing MSSA but thankfully repeat blood cultures 9/3, 9/6, and 9/10 all without growth Ankle abscess I&D'd on 9/4 Transitioned to nafcillin on 9/7 Right shoulder bursa aspirated on 9/11  Patient defervesced and continued on nafcillin MRI pelvis and MRI right shoulder obtained on 10/11-13 showed progression of lumbar/sacral osteo, shrinkage but persistence of paraspinal abscess and newly noted osteomyelitis with abscess of the right humeral head.  Post I&D R shoulder on 09/13/19.  Cefazolin infusion start date 09/12/19;  end date 10/03/19.  09/30/19: Patient was seen and examined at bedside.  No acute events overnight.  Continue IV antibiotics infusion.  End date 10/11/2019.  Plan to return to home at discharge.  Tentative discharge date 10/03/2019.  Infectious disease on 09/21/2019: Plan is to continue IV cefazolin for 6 weeks is the recommendation.(day 10)Pt is willing to stay for 3 weeks for IV abx then switch to oral for 3 weeks. We discussed doing a long acting abx such as dalbavacin upon d/c with follow up in the ID clinic.     Assessment/Plan:  Principal Problem:   Staphylococcus aureus bacteremia Active Problems:   Sepsis ([place])   Abscess of paraspinous muscles   Osteomyelitis of lumbar spine (HCC)   Abscess in epidural space of lumbar spine   Abnormal chest CT   Septic embolism (HCC)   Community acquired pneumonia   IV drug abuse ([place])   Heroin abuse ([place])   Elevated troponin   UTI (urinary tract infection)   Renal failure   Hyperglycemia   Suspected endocarditis   Chest wall abscess   Abscess of right heel   Acute osteomyelitis of lumbar spine (HCC)   Paraspinal abscess (HCC)   S/P chest tube placement (HCC)   Acute hematogenous osteomyelitis, right humerus (HCC)  Sepsis from MSSA bacteremia due to IVDA Lumbar osteomyelitis/discitis L2-4 Sacral osteomyelitis Paraspinal and epidural abscess New L4-L5 septic arthritis and progressive L5-S1 discitis/osteomyelitis Checked with IR on 09/11/2019, paraspinal abscess is too small to aspirate, further it is improving with IV nafcillin. -Seen by infectious disease.  Appreciate recommendations. -Continue IV cefazolin 2 g 3 times daily -Obtain weekly CBC/BMP, CRP ESR  TTE done on 08/01/2019 no report of vegetation Cefazolin start date 10/14- end date 10/03/19 Sed rate 38 from 70 CRP normal 0.9 No leukocytosis  POD #16 post I&D on 09/13/2019 of newly diagnosed right humeral osteomyelitis with abscess of right proximal humerus/ Subacromial/subdeltoid septic bursitis in the setting of bacteremia MRI done on 09/11/2019 showed extensive right humeral head osteomyelitis, a humeral abscess and septic bursitis again. -Healing well, no acute issues.  Resolved post repletion: Hypokalemia K+ 3.4>> 4.1 on 09/26/19  Resolved opiate-induced constipation DC Senokot 2 tablet twice daily Continue MiraLAX daily as  needed  Abscess right ankle  Resolved  Chest wall abscess s/p operative I&D with wound vac application 9/2 and 3/90, septic emboli. Healing well.  Newly diagnosed  type II diabetes with hyperglycemia Glucose 700 at admission.  No previous diagnosis of diabetes.  No family history of diabetes.  Not obese.  A1c here 7.8%. Increase sliding scale to resistant Continue insulin sliding scale  History of IV drug abuse -Continue telephonic psychology counseling as needed Hepatitis C screening nonreactive, HIV nonreactive on 08/01/2019 He is determined to keep his sobriety.  He has refrained from using his pain medications, has not used as needed oxycodone for quite some time.  He would like to return to work and maintain his sobriety.  Resolving anemia of chronic disease -Hemoglobin 11.7 from 9.0 a week ago without blood transfusion.  Hypertension BP controlled -Continue metoprolol 12.5 mg twice daily, will need rx at discharge  Other medications -Continue trazodone as needed for insomnia, will need rx at discharge  Physical debility PT rec outpt PT C/w PT with fall precaution    MDM and disposition: The below labs and imaging reports reviewed and summarized above.  Medication management as above.   The patient was admitted with MSSA bacteremia, septic pulmonary emboli, large chest wall abscess, right shoulder septic arthritis, and right ankle abscess.      Progressing well but will need to complete his IV antibiotic infusion.  End date 10/03/2019.   DVT prophylaxis:  Subcu Lovenox daily. Code Status: FULL Family Communication: None at bedside.    Consultants:   CT surgery  Orthopedics  Infectious disease  Neurosurgery  Procedures:   9/2 MRI lumbar spine  9/2 I&D left chest wall abscess with wound vac by Dr. Servando Snare  9/4 I&D right heel abscess at bedside by Hilbert Odor, [facility]  9/11 Excision wound debridemnt, wound vac change by Dr. Prescott Gum  9/14 right shoulder bursa aspiration by Silvestre Gunner, PAC  10/12 MRI pelvis   10/13 MRI RIGHT shoulder  Antimicrobials:   Nafcillin   Culture data:   9/1  urine culture -- MSSA   9/2 blood culture x2 -- MSSA in 2/2  9/2 wound culture -- MSSA  9/2 chest wall abscess intraop culture -- MSSA  9/3 blood culture x2 -- NG  9/4 heel abscess culture -- MSSA  9/6 blood culture x2 -- NG  9/10 blood culture x2 -- NG  9/14 aspirate shoulder culture -- NG     Objective: Vitals:   09/29/19 1656 09/29/19 2015 09/30/19 0022 09/30/19 0859  BP: 136/83 128/86 130/79 (!) 143/102  Pulse: 73 75 77 (!) 101  Resp: _0 Temp: 98.8 F (37.1 C) 97.9 F (36.6 C) 98.9 F (37.2 C)   TempSrc: Oral Oral Oral   SpO2: 100% 99% 100% 100%  Weight:      Height:        Intake/Output Summary (Last 24 hours) at 09/30/2019 1050 Last data filed at 09/29/2019 1600 Gross per 24 hour  Intake 1400 ml  Output -  Net 1400 ml   Filed Weights   09/18/19 0500 09/21/19 0500 09/25/19 0500  Weight: 79 kg 79 kg 79.9 kg     General: 38 y.o. year-old male pleasant well-developed well-nourished in no acute distress.  Alert and oriented x4.  Cardiovascular: Regular rate and rhythm no rubs or gallops.  Respiratory: Clear to auscultation no wheezes or rales.   Abdomen: Soft nontender nondistended normal bowel sounds.. Psychiatry: Mood is appropriate for  condition and setting.  Data Reviewed: CBC: Recent Labs  Lab 09/26/19 0908  WBC 4.3  HGB 11.7*  HCT 36.5*  MCV 92.9  PLT 626   Basic Metabolic Panel: Recent Labs  Lab 09/26/19 0908  NA 140  K 4.1  CL 105  CO2 24  GLUCOSE 126*  BUN 24*  CREATININE 0.88  CALCIUM 9.4  MG 2.0   GFR: Estimated Creatinine Clearance: 128.6 mL/min (by C-G formula based on SCr of 0.88 mg/dL). Liver Function Tests: No results for input(s): AST, ALT, ALKPHOS, BILITOT, PROT, ALBUMIN in the last 168 hours. No results for input(s): LIPASE, AMYLASE in the last 168 hours. No results for input(s): AMMONIA in the last 168 hours. Coagulation Profile: No results for input(s): INR, PROTIME in the last 168 hours. Cardiac  Enzymes: No results for input(s): CKTOTAL, CKMB, CKMBINDEX, TROPONINI in the last 168 hours. BNP (last 3 results) No results for input(s): PROBNP in the last 8760 hours. HbA1C: No results for input(s): HGBA1C in the last 72 hours. CBG: Recent Labs  Lab 09/28/19 2016 09/29/19 0626 09/29/19 1238 09/29/19 1657 09/29/19 2109  GLUCAP 108* 98 219* 85 176*   Lipid Profile: No results for input(s): CHOL, HDL, LDLCALC, TRIG, CHOLHDL, LDLDIRECT in the last 72 hours. Thyroid Function Tests: No results for input(s): TSH, T4TOTAL, FREET4, T3FREE, THYROIDAB in the last 72 hours. Anemia Panel: No results for input(s): VITAMINB12, FOLATE, FERRITIN, TIBC, IRON, RETICCTPCT in the last 72 hours. Urine analysis:    Component Value Date/Time   COLORURINE YELLOW 08/09/2019 1845   APPEARANCEUR CLEAR 08/09/2019 1845   LABSPEC 1.029 08/09/2019 1845   PHURINE 5.0 08/09/2019 1845   GLUCOSEU 50 (A) 08/09/2019 1845   HGBUR MODERATE (A) 08/09/2019 1845   BILIRUBINUR NEGATIVE 08/09/2019 1845   KETONESUR NEGATIVE 08/09/2019 1845   PROTEINUR 30 (A) 08/09/2019 1845   NITRITE NEGATIVE 08/09/2019 1845   LEUKOCYTESUR NEGATIVE 08/09/2019 1845   Sepsis Labs: _0 (procalcitonin:4,lacticidven:4)  ) No results found for this or any previous visit (from the past 240 hour(s)).    Studies: No results found.  Scheduled Meds: . feeding supplement (ENSURE ENLIVE)  237 mL Oral BID BM  . insulin aspart  0-20 Units Subcutaneous TID WC  . insulin aspart  0-5 Units Subcutaneous QHS  . magnesium oxide  400 mg Oral BID  . Melatonin  6 mg Oral Once  . metoprolol tartrate  12.5 mg Oral BID  . ondansetron  4 mg Intravenous Once  . sodium chloride flush  10-40 mL Intracatheter Q12H    Continuous Infusions: . sodium chloride 1,000 mL (08/24/19 2247)  .  ceFAZolin (ANCEF) IV 2 g (09/29/19 2218)  . lactated ringers 10 mL/hr at 08/10/19 0845     LOS: 58 days     Kayleen Memos, MD [facility]  Pager [phone]  If 7PM-7AM, please contact night-coverage [email or web address] Password [email or web address] 09/30/2019, 10:50 AM

## 2019-10-01 LAB — GLUCOSE, CAPILLARY
Glucose-Capillary: 92 mg/dL (ref 70–99)
Glucose-Capillary: 124 mg/dL — ABNORMAL HIGH (ref 70–99)
Glucose-Capillary: 153 mg/dL — ABNORMAL HIGH (ref 70–99)
Glucose-Capillary: 98 mg/dL (ref 70–99)
Glucose-Capillary: 208 mg/dL — ABNORMAL HIGH (ref 70–99)

## 2019-10-01 MED ORDER — BOOST / RESOURCE BREEZE PO LIQD CUSTOM: 1.0000 | ORAL | Status: DC

## 2019-10-01 MED ORDER — ADULT MULTIVITAMIN W/MINERALS CH
1.0000 | ORAL_TABLET | Freq: Every day | ORAL | Status: DC
  Administered 2019-10-01 – 2019-10-03 (×3): 1 via ORAL
  Filled 2019-10-01 (×3): qty 1

## 2019-10-01 MED ORDER — ENSURE ENLIVE PO LIQD: 237.0000 mL | ORAL | Status: DC

## 2019-10-01 NOTE — Progress Notes (Signed)
Pt refused CBG and weight this morning, stating that he wanted to sleep.

## 2019-10-01 NOTE — Progress Notes (Signed)
Nutrition Follow-up  DOCUMENTATION CODES:   Not applicable  INTERVENTION:  - will d/c Glucerna Shake. - will order Boost Breeze once/day, each supplement provides 250 kcal and 9 grams of protein. - will order Ensure Enlive once/day, each supplement provides 350 kcal and 20 grams of protein. - will order Magic Cup with dinner meals, each supplement provides 290 kcal and 9 grams of protein. - will order daily multivitamin with minerals.  - continue to encourage PO intakes.    NUTRITION DIAGNOSIS:   Increased nutrient needs related to wound healing, acute illness as evidenced by estimated needs. -ongoing  GOAL:   Patient will meet greater than or equal to 90% of their needs -minimally met  MONITOR:   PO intake, Supplement acceptance, Labs, Weight trends, Skin  ASSESSMENT:   37-year-old no previous medical history, heroin injection for last 10 years presented to emergency room on 9/1 with multiple complaints including lower back pain, left shoulder pain, right foot and heel pain. Pt with sepsis present on admission with MSSA bacteremia due to IV drug use. Septic pulmonary emboli, Left chest wall abscess, Right heel abscess, Extensive right posterolateral epidural abscess L2-L3, Osteomyelitis of the L5-S1, Posterior epidural abscess L5-S1 S2.  Patient has not been weighed since 10/27. He refused to be weighed earlier this morning. Per flow sheet documentation, he consumed 100% of lunch on 10/30 (637 kcal, 25 grams protein); 100% of breakfast and dinner on 10/31 (total of 1430 kcal, 62 grams protein); 100% of breakfast this AM (626 kcal, 23 grams protein). Patient reports ongoing good appetite and being able to eat without difficulty or discomfort.  Ensure Enlive ordered BID and patient has refused all bottles since RD assessment on 10/26. Will make adjustments to ONS as outlined above.   Per notes: - sepsis from MSSA d/t IVDU--resolved - lumbar osteomyelitis/diskitis (L2-4) -  paraspinal and epidural abscess - new septic arthritis and progressive L5-S1 osteomyelitis/diskitis  - opiate-induced constipation--resolved  - R ankle abscess--resolved  - chest wall abscess s/p operative I&D--healing - new dx type 2 DM this admission  - anemia of chronic illness--improving - debility   Patient needs to complete IV abx inpatient and target end date is 11/4.    Labs reviewed; CBG: 153 mg/dl. Medications reviewed; sliding scale novolog, 400 mg mag-ox BID.     NUTRITION - FOCUSED PHYSICAL EXAM:  completed to upper body only; no muscle or fat wasting to these areas.   Diet Order:   Diet Order            Diet regular Room service appropriate? Yes; Fluid consistency: Thin  Diet effective now              EDUCATION NEEDS:   No education needs have been identified at this time  Skin:  Skin Assessment: Reviewed RN Assessment Skin Integrity Issues:: Incisions Wound Vac: N/A Incisions: R shoulder (09/13/19)  Last BM:  11/2  Height:   Ht Readings from Last 1 Encounters:  09/13/19 6' 2.02" (1.88 m)    Weight:   Wt Readings from Last 1 Encounters:  09/25/19 79.9 kg    Ideal Body Weight:  86.36 kg  BMI:  Body mass index is 22.61 kg/m.  Estimated Nutritional Needs:   Kcal:  2150-2350  Protein:  105-120 grams  Fluid:  >/= 2.1 L/day     Jessica Ostheim, MS, RD, LDN, CNSC Inpatient Clinical Dietitian Pager # [phone] After hours/weekend pager # [phone]  

## 2019-10-01 NOTE — Plan of Care (Signed)
  Problem: Pain Managment: Goal: General experience of comfort will improve Outcome: Adequate for Discharge   

## 2019-10-01 NOTE — Progress Notes (Signed)
Occupational Therapy Treatment Patient Details Name: William [name] MRN: [id number] DOB: [birth year] Today's Date: 10/01/2019    History of present illness Patient is a 38 y/o male who presents with lower back pain, left shoulder pain, right foot and heel pain. Found to have right heel abscess s/p I&D 9/4, left upper chest wall abscess and mass s/p I&D and wound vac application 9/3, new onset DM, sepsis with MSSA bacteremia due to IV drug use. CT scan- left anterior chest wall soft tissue and intramuscular infection. Right subdeltoid, subacromial septic bursitis, proximal humerus osteomyelitis with abscess in humeral shaft s/p excision of bursa, saucerization of proximal humerus and humeral shaft: DOS: 09/13/2019. PMH- heroin IV drug use.    OT comments  Pt demonstrates improvement with R shoulder ROM increasing AAROM and AROM (see Upper extremity section), increased functional use of RUE to reach head to simulate washing hair. Pt continues to demonstrate limitations with ADL/IADL due to physical limitations. Reviewed general UE HEP. Feel pt's current level of functioning appropriate for d/c with followup at d/c venue listed below. Per chart, tentative d/c date 11/4. Should pt remain in acute care, OT will continue to follow.    Follow Up Recommendations  Outpatient OT    Equipment Recommendations  None recommended by OT    Recommendations for Other Services      Precautions / Restrictions Precautions Precautions: Fall Precaution Comments: shoulder surgery on 10/15;NWB RUE Restrictions Weight Bearing Restrictions: Yes RUE Weight Bearing: Non weight bearing Other Position/Activity Restrictions: Gentle PROM and AROM       Mobility Bed Mobility               General bed mobility comments: sitting in recliner upon arrival;reviewed proper log rolling technique  Transfers Overall transfer level: Needs assistance Equipment used: None Transfers: Sit to/from Stand Sit to Stand:  Supervision         General transfer comment: for safety and NWB precautions    Balance Overall balance assessment: Needs assistance Sitting-balance support: Feet supported;No upper extremity supported Sitting balance-Leahy Scale: Normal     Standing balance support: No upper extremity supported;During functional activity Standing balance-Leahy Scale: Good Standing balance comment: when back pain is low balance is good, when back pain increases balance is fair                           ADL either performed or assessed with clinical judgement   ADL Overall ADL's : Needs assistance/impaired Eating/Feeding: Independent   Grooming: Modified independent Grooming Details (indicate cue type and reason): able to reach head with RUE to wash hair Upper Body Bathing: Modified independent   Lower Body Bathing: Supervison/ safety;Sit to/from stand   Upper Body Dressing : Modified independent;Sitting   Lower Body Dressing: Supervision/safety;Sit to/from stand   Toilet Transfer: Supervision/safety   Toileting- Water quality scientist and Hygiene: Supervision/safety       Functional mobility during ADLs: Supervision/safety General ADL Comments: supervision without support;continued to reinforce proper desk setup to promote good posture      Vision   Vision Assessment?: No apparent visual deficits   Perception     Praxis      Cognition Arousal/Alertness: Awake/alert Behavior During Therapy: WFL for tasks assessed/performed Overall Cognitive Status: Within Functional Limits for tasks assessed  Exercises Exercises: Other exercises;General Upper Extremity General Exercises - Upper Extremity Shoulder Flexion: PROM;10 reps;Right;AROM Shoulder ABduction: PROM;10 reps;AROM;Seated Shoulder ADduction: PROM;AROM;10 reps;Seated Other Exercises Other Exercises: PROM shoulder external rotation- 10 reps Other  Exercises: Shoulder IR/ER AAROM x10 reps   Shoulder Instructions       General Comments VSS     Pertinent Vitals/ Pain       Pain Assessment: Faces Faces Pain Scale: Hurts a little bit Pain Location: R shoulder with AROM Pain Descriptors / Indicators: Grimacing Pain Intervention(s): Limited activity within patient's tolerance;Monitored during session  Home Living                                          Prior Functioning/Environment              Frequency  Min 2X/week        Progress Toward Goals  OT Goals(current goals can now be found in the care plan section)  Progress towards OT goals: Progressing toward goals  Acute Rehab OT Goals Patient Stated Goal: go home OT Goal Formulation: With patient Time For Goal Achievement: 10/04/19 Potential to Achieve Goals: Good ADL Goals Pt/caregiver will Perform Home Exercise Program: Right Upper extremity;Increased strength;Increased ROM;With written HEP provided Additional ADL Goal #1: Pt will increase to independence with OOB ADL with RUE pain <2/10.  Plan Discharge plan remains appropriate    Co-evaluation                 AM-PAC OT "6 Clicks" Daily Activity     Outcome Measure   Help from another person eating meals?: None Help from another person taking care of personal grooming?: None Help from another person toileting, which includes using toliet, bedpan, or urinal?: None Help from another person bathing (including washing, rinsing, drying)?: A Little Help from another person to put on and taking off regular upper body clothing?: None Help from another person to put on and taking off regular lower body clothing?: None 6 Click Score: 23    End of Session    OT Visit Diagnosis: Unsteadiness on feet (R26.81);Muscle weakness (generalized) (M62.81)   Activity Tolerance Patient tolerated treatment well   Patient Left in chair;with call bell/phone within reach   Nurse Communication  Mobility status        Time: 0076-2263 OT Time Calculation (min): 30 min  Charges: OT General Charges $OT Visit: 1 Visit OT Treatments $Self Care/Home Management : 8-22 mins $Therapeutic Exercise: 8-22 mins  Diona Browner OTR/L Acute Rehabilitation Services Office: [phone]    Rebeca Alert 10/01/2019, 4:15 PM

## 2019-10-01 NOTE — Progress Notes (Signed)
PROGRESS NOTE  KRISTI HYER EHM:[id number] DOB: March 17, 1981 DOA: 07/31/2019 PCP: Patient, No Pcp Per  HPI/Recap of past 24 hours: Mr. Lanuza is a 38 y.o. M with IVDA who presented with pain in the low back, left shoulder and right foot, as well as flu-like symptoms, progressing over a few days.  In the ER, imaging showed right heel abscess, left upper chest abscess and septic emboli.  Blood cultures obtained, started on empiric antibiotics, and admitted for presumptive endocarditis and for chest wall debridement.  Interim history: 9/2 patient admitted from ER to ICU, underwent debridement of chest wall abscess in the OR, placement of wound vac. MRI lumbar spine showed paraspinal abscess (2x2x4), epidural abscesses, sacral osteomyleitis and lumbar vertebral osteomyelitis/discitis Urine, blood and wound cultures from admission all growing MSSA but thankfully repeat blood cultures 9/3, 9/6, and 9/10 all without growth Ankle abscess I&D'd on 9/4 Transitioned to nafcillin on 9/7 Right shoulder bursa aspirated on 9/11  Patient defervesced and continued on nafcillin MRI pelvis and MRI right shoulder obtained on 10/11-13 showed progression of lumbar/sacral osteo, shrinkage but persistence of paraspinal abscess and newly noted osteomyelitis with abscess of the right humeral head.  Post I&D R shoulder on 09/13/19.  Cefazolin infusion start date 09/12/19;  end date 10/03/19.  09/30/19: Patient was seen and examined at bedside.  No acute events overnight.  Continue IV antibiotics infusion.  End date 10/11/2019.  Plan to return to home at discharge.  Tentative discharge date 10/03/2019.  Infectious disease on 09/21/2019: Plan is to continue IV cefazolin for 6 weeks is the recommendation.(day 10)Pt is willing to stay for 3 weeks for IV abx then switch to oral for 3 weeks. We discussed doing a long acting abx such as dalbavacin upon d/c with follow up in the ID clinic.  10/01/19: Patient was seen this  morning.  No changes.  Vital signs and previous labs reviewed and are stable.  Asked if he had any questions at the time of this visit and he stated no.  DC planning reviewed in details with the patient on 09/30/19.  All questions answered at that time. Will be available for additional questions.   Assessment/Plan: Principal Problem:   Staphylococcus aureus bacteremia Active Problems:   Sepsis ([place])   Abscess of paraspinous muscles   Osteomyelitis of lumbar spine ([facility])   Abscess in epidural space of lumbar spine   Abnormal chest CT   Septic embolism ([facility])   Community acquired pneumonia   IV drug abuse ([place])   Heroin abuse ([place])   Elevated troponin   UTI (urinary tract infection)   Renal failure   Hyperglycemia   Suspected endocarditis   Chest wall abscess   Abscess of right heel   Acute osteomyelitis of lumbar spine ([facility])   Paraspinal abscess ([facility])   S/P chest tube placement ([facility])   Acute hematogenous osteomyelitis, right humerus ([facility])  Sepsis from MSSA bacteremia due to IVDA Lumbar osteomyelitis/discitis L2-4 Sacral osteomyelitis Paraspinal and epidural abscess New L4-L5 septic arthritis and progressive L5-S1 discitis/osteomyelitis Checked with IR on 09/11/2019, paraspinal abscess is too small to aspirate, further it is improving with IV nafcillin. -Seen by infectious disease.  Appreciate recommendations. -Continue IV cefazolin 2 g 3 times daily -Obtain weekly CBC/BMP, CRP ESR  TTE done on 08/01/2019 no report of vegetation Cefazolin start date 10/14- end date 10/03/19 Sed rate 38 from 70 CRP normal 0.9 No leukocytosis  POD #18 post I&D on 09/13/2019 of newly diagnosed right humeral osteomyelitis with  abscess of right proximal humerus/ Subacromial/subdeltoid septic bursitis in the setting of bacteremia MRI done on 09/11/2019 showed extensive right humeral head osteomyelitis, a humeral abscess and septic bursitis again. -Healing well, no acute issues.  Resolved post  repletion: Hypokalemia K+ 3.4>> 4.1 on 09/26/19  Resolved opiate-induced constipation DC Senokot 2 tablet twice daily Continue MiraLAX daily as needed  Abscess right ankle  Resolved  Chest wall abscess s/p operative I&D with wound vac application 9/2 and 1/30, septic emboli. Healing well.  Newly diagnosed type II diabetes with hyperglycemia Glucose 700 at admission.  No previous diagnosis of diabetes.  No family history of diabetes.  Not obese.  A1c here 7.8%. Increase sliding scale to resistant Continue insulin sliding scale  History of IV drug abuse -Continue telephonic psychology counseling as needed Hepatitis C screening nonreactive, HIV nonreactive on 08/01/2019 He is determined to keep his sobriety.  He has refrained from using his pain medications, has not used as needed oxycodone for quite some time.  He would like to return to work and maintain his sobriety.  Resolving anemia of chronic disease -Hemoglobin 11.7 from 9.0 a week ago without blood transfusion.  Hypertension BP controlled -Continue metoprolol 12.5 mg twice daily, will need rx at discharge  Other medications -Continue trazodone as needed for insomnia, will need rx at discharge  Physical debility PT rec outpt PT C/w PT with fall precaution    MDM and disposition: The below labs and imaging reports reviewed and summarized above.  Medication management as above.   The patient was admitted with MSSA bacteremia, septic pulmonary emboli, large chest wall abscess, right shoulder septic arthritis, and right ankle abscess.      Progressing well but will need to complete his IV antibiotic infusion.  End date 10/03/2019.   DVT prophylaxis:  Subcu Lovenox daily. Code Status: FULL Family Communication: None at bedside.    Consultants:   CT surgery  Orthopedics  Infectious disease  Neurosurgery  Procedures:   9/2 MRI lumbar spine  9/2 I&D left chest wall abscess with wound vac by  Dr. Servando Snare  9/4 I&D right heel abscess at bedside by Hilbert Odor, [facility]  9/11 Excision wound debridemnt, wound vac change by Dr. [place] Gum  9/14 right shoulder bursa aspiration by Silvestre Gunner, PAC  10/12 MRI pelvis   10/13 MRI RIGHT shoulder  Antimicrobials:   Nafcillin   Culture data:   9/1 urine culture -- MSSA   9/2 blood culture x2 -- MSSA in 2/2  9/2 wound culture -- MSSA  9/2 chest wall abscess intraop culture -- MSSA  9/3 blood culture x2 -- NG  9/4 heel abscess culture -- MSSA  9/6 blood culture x2 -- NG  9/10 blood culture x2 -- NG  9/14 aspirate shoulder culture -- NG     Objective: Vitals:   09/30/19 1151 09/30/19 1955 10/01/19 0845 10/01/19 1116  BP: 123/81 (!) 143/96 (!) 148/89 123/85  Pulse: 79 90  78  Resp: _0 Temp:   98.2 F (36.8 C) 98.2 F (36.8 C)  TempSrc:  Oral Oral Oral  SpO2: 100% 100% 100% 100%  Weight:      Height:        Intake/Output Summary (Last 24 hours) at 10/01/2019 1324 Last data filed at 10/01/2019 0900 Gross per 24 hour  Intake 310 ml  Output -  Net 310 ml   Filed Weights   09/18/19 0500 09/21/19 0500 09/25/19 0500  Weight: 79 kg 79 kg  79.9 kg     General: 38 y.o. year-old male pleasant well-developed well-nourished in no acute distress.  Alert and oriented x4.  Cardiovascular: Regular rate and rhythm no rubs or gallops.  Respiratory: Clear to auscultation no wheezes or rales.   Abdomen: Soft nontender nondistended normal bowel sounds.. Psychiatry: Mood is appropriate for condition and setting.  Data Reviewed: CBC: Recent Labs  Lab 09/26/19 0908  WBC 4.3  HGB 11.7*  HCT 36.5*  MCV 92.9  PLT 258   Basic Metabolic Panel: Recent Labs  Lab 09/26/19 0908  NA 140  K 4.1  CL 105  CO2 24  GLUCOSE 126*  BUN 24*  CREATININE 0.88  CALCIUM 9.4  MG 2.0   GFR: Estimated Creatinine Clearance: 128.6 mL/min (by C-G formula based on SCr of 0.88 mg/dL). Liver Function Tests: No  results for input(s): AST, ALT, ALKPHOS, BILITOT, PROT, ALBUMIN in the last 168 hours. No results for input(s): LIPASE, AMYLASE in the last 168 hours. No results for input(s): AMMONIA in the last 168 hours. Coagulation Profile: No results for input(s): INR, PROTIME in the last 168 hours. Cardiac Enzymes: No results for input(s): CKTOTAL, CKMB, CKMBINDEX, TROPONINI in the last 168 hours. BNP (last 3 results) No results for input(s): PROBNP in the last 8760 hours. HbA1C: No results for input(s): HGBA1C in the last 72 hours. CBG: Recent Labs  Lab 09/29/19 1657 09/29/19 2109 09/30/19 1225 09/30/19 1634 10/01/19 1111  GLUCAP 85 176* 129* 110* 153*   Lipid Profile: No results for input(s): CHOL, HDL, LDLCALC, TRIG, CHOLHDL, LDLDIRECT in the last 72 hours. Thyroid Function Tests: No results for input(s): TSH, T4TOTAL, FREET4, T3FREE, THYROIDAB in the last 72 hours. Anemia Panel: No results for input(s): VITAMINB12, FOLATE, FERRITIN, TIBC, IRON, RETICCTPCT in the last 72 hours. Urine analysis:    Component Value Date/Time   COLORURINE YELLOW 08/09/2019 1845   APPEARANCEUR CLEAR 08/09/2019 1845   LABSPEC 1.029 08/09/2019 1845   PHURINE 5.0 08/09/2019 1845   GLUCOSEU 50 (A) 08/09/2019 1845   HGBUR MODERATE (A) 08/09/2019 1845   BILIRUBINUR NEGATIVE 08/09/2019 1845   KETONESUR NEGATIVE 08/09/2019 1845   PROTEINUR 30 (A) 08/09/2019 1845   NITRITE NEGATIVE 08/09/2019 1845   LEUKOCYTESUR NEGATIVE 08/09/2019 1845   Sepsis Labs: _0 (procalcitonin:4,lacticidven:4)  ) No results found for this or any previous visit (from the past 240 hour(s)).    Studies: No results found.  Scheduled Meds: . [START ON 10/02/2019] feeding supplement  1 Container Oral Q24H  . [START ON 10/02/2019] feeding supplement (ENSURE ENLIVE)  237 mL Oral Q24H  . insulin aspart  0-20 Units Subcutaneous TID WC  . insulin aspart  0-5 Units Subcutaneous QHS  . magnesium oxide  400 mg Oral BID  .  Melatonin  6 mg Oral Once  . metoprolol tartrate  12.5 mg Oral BID  . multivitamin with minerals  1 tablet Oral Daily  . ondansetron  4 mg Intravenous Once  . sodium chloride flush  10-40 mL Intracatheter Q12H    Continuous Infusions: . sodium chloride 1,000 mL (08/24/19 2247)  .  ceFAZolin (ANCEF) IV 2 g (10/01/19 0659)  . lactated ringers 10 mL/hr at 08/10/19 0845     LOS: 72 days     Kayleen Memos, MD [facility] Pager [phone]  If 7PM-7AM, please contact night-coverage [email or web address] Password [email or web address] 10/01/2019, 1:24 PM

## 2019-10-02 DIAGNOSIS — R29890 Loss of height: Secondary | ICD-10-CM

## 2019-10-02 DIAGNOSIS — G601 Refsum's disease: Secondary | ICD-10-CM

## 2019-10-02 DIAGNOSIS — M009 Pyogenic arthritis, unspecified: Secondary | ICD-10-CM

## 2019-10-02 LAB — GLUCOSE, CAPILLARY
Glucose-Capillary: 97 mg/dL (ref 70–99)
Glucose-Capillary: 145 mg/dL — ABNORMAL HIGH (ref 70–99)
Glucose-Capillary: 148 mg/dL — ABNORMAL HIGH (ref 70–99)
Glucose-Capillary: 142 mg/dL — ABNORMAL HIGH (ref 70–99)

## 2019-10-02 MED ORDER — ADULT MULTIVITAMIN W/MINERALS CH: 1.0000 | ORAL_TABLET | Freq: Every day | ORAL | 0 refills | Status: AC

## 2019-10-02 MED ORDER — METOPROLOL TARTRATE 25 MG PO TABS: 12.5000 mg | ORAL_TABLET | Freq: Two times a day (BID) | ORAL | 0 refills | Status: AC

## 2019-10-02 MED ORDER — METFORMIN HCL 500 MG PO TABS: 500.0000 mg | ORAL_TABLET | Freq: Two times a day (BID) | ORAL | 3 refills | Status: AC

## 2019-10-02 MED ORDER — CYCLOBENZAPRINE HCL 5 MG PO TABS: 5.0000 mg | ORAL_TABLET | Freq: Every day | ORAL | 0 refills | Status: AC | PRN

## 2019-10-02 MED ORDER — TRAZODONE HCL 50 MG PO TABS: 50.0000 mg | ORAL_TABLET | Freq: Every evening | ORAL | 0 refills | Status: AC | PRN

## 2019-10-02 MED FILL — CYCLOBENZAPRINE HCL 5 MG TA: 5 | 20 days supply | Qty: 20 | Fill #0

## 2019-10-02 MED FILL — METOPROLOL TARTRATE 25 MG T: 25 | 90 days supply | Qty: 90 | Fill #0

## 2019-10-02 MED FILL — traZODone HCL 50 MG TABS: 50 | 20 days supply | Qty: 20 | Fill #0

## 2019-10-02 MED FILL — metFORMIN HCL 500 MG TABS: 500 | 90 days supply | Qty: 180 | Fill #0

## 2019-10-02 NOTE — Discharge Instructions (Addendum)
Blood Glucose Monitoring, Adult Monitoring your blood sugar (glucose) is an important part of managing your diabetes (diabetes mellitus). Blood glucose monitoring involves checking your blood glucose as often as directed and keeping a record (log) of your results over time. Checking your blood glucose regularly and keeping a blood glucose log can:  Help you and your health care provider adjust your diabetes management plan as needed, including your medicines or insulin.  Help you understand how food, exercise, illnesses, and medicines affect your blood glucose.  Let you know what your blood glucose is at any time. You can quickly find out if you have low blood glucose (hypoglycemia) or high blood glucose (hyperglycemia). Your health care provider will set individualized treatment goals for you. Your goals will be based on your age, other medical conditions you have, and how you respond to diabetes treatment. Generally, the goal of treatment is to maintain the following blood glucose levels:  Before meals (preprandial): 80-130 mg/dL (4.4-7.2 mmol/L).  After meals (postprandial): below 180 mg/dL (10 mmol/L).  A1c level: less than 7%. Supplies needed:  Blood glucose meter.  Test strips for your meter. Each meter has its own strips. You must use the strips that came with your meter.  A needle to prick your finger (lancet). Do not use a lancet more than one time.  A device that holds the lancet (lancing device).  A journal or log book to write down your results. How to check your blood glucose  1. Wash your hands with soap and water. 2. Prick the side of your finger (not the tip) with the lancet. Use a different finger each time. 3. Gently rub the finger until a small drop of blood appears. 4. Follow instructions that come with your meter for inserting the test strip, applying blood to the strip, and using your blood glucose meter. 5. Write down your result and any notes. Some meters  allow you to use areas of your body other than your finger (alternative sites) to test your blood. The most common alternative sites are:  Forearm.  Thigh.  Palm of the hand. If you think you may have hypoglycemia, or if you have a history of not knowing when your blood glucose is getting low (hypoglycemia unawareness), do not use alternative sites. Use your finger instead. Alternative sites may not be as accurate as the fingers, because blood flow is slower in these areas. This means that the result you get may be delayed, and it may be different from the result that you would get from your finger. Follow these instructions at home: Blood glucose log   Every time you check your blood glucose, write down your result. Also write down any notes about things that may be affecting your blood glucose, such as your diet and exercise for the day. This information can help you and your health care provider: ? Look for patterns in your blood glucose over time. ? Adjust your diabetes management plan as needed.  Check if your meter allows you to download your records to a computer. Most glucose meters store a record of glucose readings in the meter. If you have type 1 diabetes:  Check your blood glucose 2 or more times a day.  Also check your blood glucose: ? Before every insulin injection. ? Before and after exercise. ? Before meals. ? 2 hours after a meal. ? Occasionally between 2:00 a.m. and 3:00 a.m., as directed. ? Before potentially dangerous tasks, like driving or using heavy  machinery. ? At bedtime.  You may need to check your blood glucose more often, up to 6-10 times a day, if you: ? Use an insulin pump. ? Need multiple daily injections (MDI). ? Have diabetes that is not well-controlled. ? Are ill. ? Have a history of severe hypoglycemia. ? Have hypoglycemia unawareness. If you have type 2 diabetes:  If you take insulin or other diabetes medicines, check your blood glucose 2 or  more times a day.  If you are on intensive insulin therapy, check your blood glucose 4 or more times a day. Occasionally, you may also need to check between 2:00 a.m. and 3:00 a.m., as directed.  Also check your blood glucose: ? Before and after exercise. ? Before potentially dangerous tasks, like driving or using heavy machinery.  You may need to check your blood glucose more often if: ? Your medicine is being adjusted. ? Your diabetes is not well-controlled. ? You are ill. General tips  Always keep your supplies with you.  If you have questions or need help, all blood glucose meters have a 24-hour "hotline" phone number that you can call. You may also contact your health care provider.  After you use a few boxes of test strips, adjust (calibrate) your blood glucose meter by following instructions that came with your meter. Contact a health care provider if:  Your blood glucose is at or above 240 mg/dL (16.1 mmol/L) for 2 days in a row.  You have been sick or have had a fever for 2 days or longer, and you are not getting better.  You have any of the following problems for more than 6 hours: ? You cannot eat or drink. ? You have nausea or vomiting. ? You have diarrhea. Get help right away if:  Your blood glucose is lower than 54 mg/dL (3 mmol/L).  You become confused or you have trouble thinking clearly.  You have difficulty breathing.  You have moderate or large ketone levels in your urine. Summary  Monitoring your blood sugar (glucose) is an important part of managing your diabetes (diabetes mellitus).  Blood glucose monitoring involves checking your blood glucose as often as directed and keeping a record (log) of your results over time.  Your health care provider will set individualized treatment goals for you. Your goals will be based on your age, other medical conditions you have, and how you respond to diabetes treatment.  Every time you check your blood glucose,  write down your result. Also write down any notes about things that may be affecting your blood glucose, such as your diet and exercise for the day. This information is not intended to replace advice given to you by your health care provider. Make sure you discuss any questions you have with your health care provider. Document Released: 11/18/2003 Document Revised: 09/08/2018 Document Reviewed: 04/26/2016 Elsevier Patient Education  2020 [employer]   Blunt Chest Trauma Blunt chest trauma is an injury that is caused by a hard, direct hit (blow) to the chest. The blow can be strong enough to injure multiple body parts and organs. A blunt trauma does not involve a puncture of the skin. Blunt chest trauma often results in bruised or broken (fractured) ribs. In many cases, the soft tissue in the chest wall is also injured, and that damage causes pain and bruising. Internal organs, such as the heart and lungs, can be injured as well. Blunt chest trauma can lead to serious medical problems. This injury requires immediate  medical care. What are the causes? This condition may be caused by:  Motor vehicle collisions.  Falls.  Physical violence.  Sports injuries. What are the signs or symptoms? Symptoms of this condition include:  Chest pain. The pain may be worse when you breathe deeply or move.  Shortness of breath.  Light-headedness.  Bruising.  Tenderness.  Swelling.  Drowsiness or confusion.  Cold and clammy skin.  Feeling as if your heart is racing. How is this diagnosed? This condition is diagnosed based on your medical history and a physical exam. You may also have imaging tests, including:  X-ray.  CT scan.  MRI.  Ultrasound. How is this treated? Treatment for this condition depends on what type of injury you have and how severe it is. You may need to stay in the hospital. Treatment may include:  Pain medicine. You may be given pain medicine through an IV at  first. You may also be given over-the-counter and prescription pain medicines.  Tubes or other devices to help you breathe (like an incentive spirometer).  Inserting a tube into your chest to drain excess fluid, blood, or air.  Fluid or blood transfusions through an IV.  Surgery to repair broken ribs and fix damaged tissue and organs.  Lung (pulmonary) rehabilitation. This may include exercises, counseling, or support groups. Follow these instructions at home: Activity   Do not lift anything that is heavier than 10 lb (4.5 kg), or the limit that you are told, until your health care provider says that it is safe.  Limit your activity as told by your health care provider. Ask your health care provider what activities are safe for you.  Do exercises as told by your health care provider. Medicines  Take over-the-counter and prescription medicines only as told by your health care provider.  Do not drive or use heavy machinery while taking prescription pain medicine.  If you are taking prescription pain medicine, take actions to prevent or treat constipation. Your health care provider may recommend that you: ? Drink enough fluid to keep your urine pale yellow. ? Eat foods that are high in fiber, such as fresh fruits and vegetables, whole grains, and beans. ? Limit foods that are high in fat and processed sugars, such as fried or sweet foods. ? Take an over-the-counter or prescription medicine for constipation. General instructions   If directed, apply ice to the injured area: ? Put ice in a plastic bag. ? Place a towel between your skin and the bag. ? Leave the ice on for 20 minutes, 2-3 times a day.  Do not use any products that contain nicotine or tobacco, such as cigarettes and e-cigarettes. These may delay healing after an injury. If you need help quitting, ask your health care provider.  Use your incentive spirometer as directed to help you take deep breaths.  Consider  joining a support group. This may help you learn about your condition and techniques to help with recovery.  Keep all follow-up visits as told by your health care provider. This is important. Follow-up visits may include counseling. Contact a health care provider if:  Your pain gets worse.  You develop a cough or wheezing. Get help right away if:  You have any of these: ? Shortness of breath. ? Pain in your abdomen. ? Nausea or vomiting. ? A fever or chills. ? A rapid heart rate.  You cough up blood.  You feel dizzy or weak.  You faint.  You have severe chest  pain. This may come with other symptoms, such as: ? Dizziness. ? Shortness of breath. ? Pain in your neck, jaw, or back, or in one arm or both arms. These symptoms may represent a serious problem that is an emergency. Do not wait to see if the symptoms will go away. Get medical help right away. Call your local emergency services (911 in the [place] Do not drive yourself to the hospital. Summary  Blunt chest trauma is an injury that is caused by a hard, direct hit (blow) to the chest. It may involve broken ribs, damage to the chest wall, and injury to internal organs such as the heart and lungs.  Blunt chest trauma can lead to serious medical problems. This injury requires immediate medical care.  Symptoms may include chest pain when moving or taking deep breaths, shortness of breath, bruising or swelling of the chest, faster heartbeat, light-headedness, or drowsiness.  Treatment for this condition depends on what type of injury you have and how severe it is.  Make sure you know which symptoms should cause you to get help right away. This information is not intended to replace advice given to you by your health care provider. Make sure you discuss any questions you have with your health care provider. Document Released: 12/23/2004 Document Revised: 10/28/2017 Document Reviewed: 10/05/2017 Elsevier Patient Education  2020  [employer]   Bacteremia, Adult Bacteremia is the presence of bacteria in the blood. When bacteria enter the bloodstream, they can cause a life-threatening reaction called sepsis, which is a medical emergency. Bacteremia can spread to other parts of the body, including the heart, joints, and brain. What are the causes? This condition is caused by bacteria that get into the blood.  Bacteria can enter the blood: ? From a skin infection or injury, such as a burn or a cut. ? From a lung infection (pneumonia). ? From an infection in your stomach or intestines (gastrointestinal infection). ? From an infection in your bladder or urinary system (urinary tract infection). ? During a dental or medical procedure. ? From bleeding gums. ? When a bacterial infection in another part of your body spreads to your blood. ? Through an unclean (contaminated) needle. What increases the risk? This condition is more likely to develop in children, the elderly, and people who:  Have a long-term (chronic) disease or condition like diabetes or chronic kidney failure.  Have an artificial joint or heart valve.  Have heart valve disease.  Have a tube inserted to treat a medical condition, such as a urinary catheter or IV.  Have a weak disease-fighting system (immune system).  Inject illegal drugs.  Have been hospitalized for more than 10 days in a row. What are the signs or symptoms? Symptoms of this condition include:  Fever.  Chills.  Fast heartbeat.  Shortness of breath.  Dizziness.  Weakness.  Confusion.  Nausea or vomiting.  Diarrhea.  Low blood pressure.  Decreased urine output. Bacteremia that has spread to other parts of the body may cause symptoms in those areas. In some cases, there are no symptoms. How is this diagnosed? This condition may be diagnosed with a physical exam and tests, such as:  A complete blood count (CBC). This test checks for signs of infection.  Blood  cultures. These check for bacteria in your blood.  Tests of any tubes that you have had inserted. These tests check for a source of infection.  Urine tests, including urine cultures. These check for bacteria in the  urine that could be a source of infection.  Imaging tests, such as an X-ray, CT scan, MRI, or heart ultrasound. These check for a source of infection in other parts of your body, such as your lungs, heart valves, or joints. How is this treated? This condition is usually treated in the hospital. Treatment may involve:  Antibiotic medicines. These may be given by mouth (orally) or directly into your blood through an IV (infusion through your vein). ? Depending on the source of infection, you may need antibiotics for several weeks. ? At first, you may be given an antibiotic to kill most types of blood bacteria (broad-spectrum antibiotic). If your test results show that a certain kind of bacteria is causing the problem, you may be given a different antibiotic to kill that specific bacteria.  IV fluids.  Removing any catheter or device that could be a source of infection.  Blood pressure and breathing support, if needed.  Surgery to control the source or the spread of infection, such as surgery to remove an infected device, abscess, or tissue.  Having follow-up visits for medicines, blood tests, and further evaluation. Follow these instructions at home: Medicines  Take over-the-counter and prescription medicines only as told by your health care provider.  If you were prescribed an antibiotic medicine, take it as told by your health care provider. Do not stop taking the antibiotic even if you start to feel better. General instructions   Rest as needed. Ask your health care provider when you may return to normal activities.  Drink enough fluid to keep your urine pale yellow.  Do not use any products that contain nicotine or tobacco, such as cigarettes and e-cigarettes. If you  need help quitting, ask your health care provider.  Keep all follow-up visits as told by your health care provider. This is important. How is this prevented?   Wash your hands regularly with soap and water. If soap and water are not available, use hand sanitizer.  You should wash your hands: ? After using the toilet or changing a diaper. ? Before preparing, cooking, or serving food. ? While caring for a sick person or while visiting someone in a hospital. ? Before and after changing bandages (dressings) over wounds.  Clean any scrapes or cuts with soap and water and cover them with clean dressings.  Get vaccinations as recommended by your health care provider.  Practice good oral hygiene. Brush your teeth two times a day, and floss regularly.  Take good care of your skin. This includes bathing and moisturizing on a regular basis. Get help right away if you have:  Pain.  A fever or chills.  Trouble breathing.  A fast heart rate.  Skin that is blotchy, pale, or clammy.  Confusion.  Weakness.  Lack of energy (lethargy) or unusual sleepiness.  Diarrhea.  New symptoms that develop after treatment has started. These symptoms may represent a serious problem that is an emergency. Do not wait to see if the symptoms will go away. Get medical help right away. Call your local emergency services (911 in the [place] Do not drive yourself to the hospital. Summary  Bacteremia is the presence of bacteria in the blood. When bacteria enter the bloodstream, they can cause a life-threatening reaction called sepsis.  Some symptoms of bacteremia include fever, chills, shortness of breath, confusion, nausea or vomiting, and diarrhea.  Tests may be done to find the source of infection that led to bacteremia. These tests may include blood tests,  urine tests, and imaging tests.  Bacteremia is usually treated with antibiotic medicines in the hospital.  Get help right away if you have any new  symptoms that develop after treatment has started. This information is not intended to replace advice given to you by your health care provider. Make sure you discuss any questions you have with your health care provider. Document Released: 08/29/2006 Document Revised: 03/27/2018 Document Reviewed: 03/27/2018 Elsevier Patient Education  2020 [employer] Septic Arthritis Septic arthritis is inflammation of a joint that results from an infection. The infection occurs when bacteria or other germs get inside a joint. The knee and hip joints are most often affected, but other joints may also become infected. Usually, just one joint is affected. Joint infections need to be treated quickly to prevent damage to the joint, and to prevent the infection from spreading to other areas of your body. What are the causes? This condition is most often caused by Staphylococcus bacteria. Other causes may include:  Fungal infections.  Sexually transmitted infections (STIs).  Tuberculosis. Bacteria or other germs can spread to the joint. These bacteria are usually from:  Blood carrying germs from an infection in another part of your body to your joint. This is the most common cause of septic arthritis.  An open wound near the joint.  A needle put into the joint.  Joint surgery.  An infection in the bone (osteomyelitis) that spreads to the joint. What increases the risk? You may have a higher risk for this condition if you:  Have an artificial joint.  Have a blood or skin infection.  Have open sores or wounds on your skin.  Had a recent joint surgery or procedure.  Had a recent joint injury.  Have a long-term (chronic) disease, such as: ? Diabetes. ? Osteoarthritis. ? Rheumatoid arthritis. ? HIV (human immunodeficiency virus).  Have a condition or take medicines that weaken your bodys defense system (immune system).  Use IV medicines.  Have gonorrhea.  Have a central line for IV  access. What are the signs or symptoms? Symptoms of this condition include:  Swelling at the joint.  Severe pain in the joint.  Redness and warmth in the joint.  Being unable to move the joint.  Fever and chills. How is this diagnosed? This condition may be diagnosed based on:  Your symptoms.  Your medical history.  A physical exam.  Other tests to confirm the diagnosis. These may include: ? Removing fluid from your joint to look for signs of infection (synovial fluid analysis). ? Blood tests.  Imaging studies. These may include: ? X-rays. ? MRI. ? CT scan. ? Ultrasound. How is this treated? This condition may be treated by:  Draining fluid from your joint. This may be done for several days in order to relieve pain.  Taking antibiotic medicine. This may be given by IV or by mouth. It may be done in a hospital at first. You may have to continue antibiotics at home by IV or by mouth for several weeks after that.  Surgery to remove: ? Infected fluid and tissue from the joint. ? An infected artificial joint. After the infection has started to heal, you may need physical therapy to regain strength and motion of the joint. Follow these instructions at home: Medicines   Take over-the-counter and prescription medicines only as told by your health care provider.  If you were prescribed an antibiotic medicine, take it as told by your health care provider. Do not stop  taking the antibiotic even if you start to feel better.  Follow instructions from your health care provider about how to take antibiotics at home by IV. You may need to have a nurse come to your home to give you antibiotics through IV. Managing pain, stiffness, and swelling   If directed, put ice on the affected area: ? Put ice in a plastic bag. ? Place a towel between your skin and the bag. ? Leave the ice on for 20 minutes, 2-3 times a day.  Raise (elevate) the affected area above the level of your  heart while you are sitting or lying down. Activity  Return to your normal activities as told by your health care provider. Ask your health care provider what activities are safe for you.  Do any exercises or stretches as told by your health care provider or physical therapist. General instructions  Do not use any products that contain nicotine or tobacco, such as cigarettes and e-cigarettes. These can delay healing. If you need help quitting, ask your health care provider.  Wash your hands often with soap and water. If soap and water are not available, use hand sanitizer.  Keep all follow-up visits as told by your health care provider. This is important. Contact a health care provider if you:  Have pain that is not controlled with medicine.  Develop a fever or chills.  Have redness, warmth, pain, or swelling that returns after treatment. Get help right away if you:  Have signs of worsening infection in your joint. Watch for: ? Very severe pain. ? Redness. ? Warmth. ? Swelling.  Have rapid breathing or you have trouble breathing.  Have chest pain.  Cannot drink fluids or make urine.  Notice that the affected area changed color or turned blue.  Have numbness or severe pain in the affected area. Summary  Septic arthritis is inflammation of a joint that occurs when bacteria or other germs get inside a joint and cause an infection.  Joint infections need to be treated quickly to prevent damage to the joint, and to prevent the infection from spreading to other areas of your body.  Symptoms of joint infection include redness, warmth, swelling, pain, and being unable to move a joint.  Treatment usually involves draining fluid from the joint and taking antibiotic medicine. This information is not intended to replace advice given to you by your health care provider. Make sure you discuss any questions you have with your health care provider. Document Released: 02/05/2003  Document Revised: 03/09/2019 Document Reviewed: 12/27/2017 Else[employer] Patient Education  2020 [employer] Osteomyelitis, Adult  Bone infections (osteomyelitis) occur when bacteria or other germs get inside a bone. This can happen if you have an infection in another part of your body that spreads through your blood. Germs from your skin or from outside of your body can also cause this type of infection if you have a wound or a broken bone (fracture) that breaks the skin. Bone infections need to be treated quickly to prevent bone damage and to prevent the infection from spreading to other areas of your body. What are the causes? Most bone infections are caused by bacteria. They can also be caused by other germs, such as viruses and funguses. What increases the risk? You are more likely to develop this condition if you:  Recently had surgery, especially bone or joint surgery.  Have a long-term (chronic) disease, such as: ? Diabetes. ? HIV (human immunodeficiency virus). ? Rheumatoid arthritis. ? Sickle  cell anemia. ? Kidney disease that requires dialysis.  Are aged 76 years or older.  Have a condition or take medicines that block or weaken your body's defense system (immune system).  Have a condition that reduces your blood flow.  Have an artificial joint.  Have had a joint or bone repaired with plates or screws (surgical hardware).  Use IV drugs.  Have a central line for IV access.  Have had trauma, such as stepping on a nail or a broken bone that came through the skin. What are the signs or symptoms? Symptoms vary depending on the type and location of your infection. Common symptoms of bone infections include:  Fever and chills.  Skin redness and warmth.  Swelling.  Pain and stiffness.  Drainage of fluid or pus near the infection. How is this diagnosed? This condition may be diagnosed based on:  Your symptoms and medical history.  A physical exam.  Tests, such  as: ? A sample of tissue, fluid, or blood taken to be examined under a microscope. ? Pus or discharge swabbed from a wound for testing to identify germs and to determine what type of medicine will kill them (culture and sensitivity). ? Blood tests.  Imaging studies. These may include: ? X-rays. ? MRI. ? CT scan. ? Bone scan. ? Ultrasound. How is this treated? Treatment for this condition depends on the cause and type of infection. Antibiotic medicines are usually the first treatment for a bone infection. This may be done in a hospital at first. You may have to continue IV antibiotics at home or take antibiotics by mouth for several weeks after that. Other treatments may include surgery to remove:  Dead or dying tissue from a bone.  An infected artificial joint.  Infected plates or screws that were used to repair a broken bone. Follow these instructions at home: Medicines   Take over-the-counter and prescription medicines only as told by your health care provider.  Take your antibiotic medicine as told by your health care provider. Do not stop taking the antibiotic even if you start to feel better.  Follow instructions from your health care provider about how to take IV antibiotics at home. You may need to have a nurse come to your home to give you the IV antibiotics. General instructions   Ask your health care provider if you have any restrictions on your activities.  If directed, put ice on the affected area: ? Put ice in a plastic bag. ? Place a towel between your skin and the bag. ? Leave the ice on for 20 minutes, 2-3 times a day.  Wash your hands often with soap and water. If soap and water are not available, use hand sanitizer.  Do not use any products that contain nicotine or tobacco, such as cigarettes and e-cigarettes. These can delay bone healing. If you need help quitting, ask your health care provider.  Keep all follow-up visits as told by your health care  provider. This is important. Contact a health care provider if:  You develop a fever or chills.  You have redness, warmth, pain, or swelling that returns after treatment. Get help right away if:  You have rapid breathing or you have trouble breathing.  You have chest pain.  You cannot drink fluids or make urine.  The affected area swells, changes color, or turns blue.  You have numbness or severe pain in the affected area. Summary  Bone infections (osteomyelitis) occur when bacteria or other germs get  inside a bone.  You may be more likely to get this type of infection if you have a condition, such as diabetes, that lowers your ability to fight infection or increases your chances of getting an infection.  Most bone infections are caused by bacteria. They can also be caused by other germs, such as viruses and funguses.  Treatment for this condition usually starts with taking antibiotics. Further treatment depends on the cause and type of infection. This information is not intended to replace advice given to you by your health care provider. Make sure you discuss any questions you have with your health care provider. Document Released: 11/15/2005 Document Revised: 12/01/2017 Document Reviewed: 11/24/2017 Elsevier Patient Education  2020 [employer].      Be sure to purchase a blood sugar meter at [employer]. Buy the meter, test strips, alcohol swabs.  _________________________________________________________________________   Carbohydrate Counting For People With Diabetes  Why Is Carbohydrate Counting Important?  Counting carbohydrate servings may help you control your blood glucose level so that you feel better.   The balance between the carbohydrates you eat and insulin determines what your blood glucose level will be after eating.   Carbohydrate counting can also help you plan your meals. Which Foods Have Carbohydrates? Foods with carbohydrates include:  Breads,  crackers, and cereals   Pasta, rice, and grains   Starchy vegetables, such as potatoes, corn, and peas   Beans and legumes   Milk, soy milk, and yogurt   Fruits and fruit juices   Sweets, such as cakes, cookies, ice cream, jam, and jelly Carbohydrate Servings In diabetes meal planning, 1 serving of a food with carbohydrate has about 15 grams of carbohydrate:  Check serving sizes with measuring cups and spoons or a food scale.   Read the Nutrition Facts on food labels to find out how many grams of carbohydrate are in foods you eat. The food lists in this handout show portions that have about 15 grams of carbohydrate.  Tips Meal Planning Tips  An Eating Plan tells you how many carbohydrate servings to eat at your meals and snacks. For many adults, eating 3 to 5 servings of carbohydrate foods at each meal and 1 or 2 carbohydrate servings for each snack works well.   In a healthy daily Eating Plan, most carbohydrates come from:   At least 6 servings of fruits and nonstarchy vegetables   At least 6 servings of grains, beans, and starchy vegetables, with at least 3 servings from whole grains   At least 2 servings of milk or milk products  Check your blood glucose level regularly. It can tell you if you need to adjust when you eat carbohydrates.   Eating foods that have fiber, such as whole grains, and having very few salty foods is good for your health.   Eat 4 to 6 ounces of meat or other protein foods (such as soybean burgers) each day. Choose low-fat sources of protein, such as lean beef, lean pork, chicken, fish, low-fat cheese, or vegetarian foods such as soy.   Eat some healthy fats, such as olive oil, canola oil, and nuts.   Eat very little saturated fats. These unhealthy fats are found in butter, cream, and high-fat meats, such as bacon and sausage.   Eat very little or no trans fats. These unhealthy fats are found in all foods that list partially hydrogenated oil as  an ingredient.  Label Reading Tips The Nutrition Facts panel on a label lists the grams  of total carbohydrate in 1 standard serving. The label's standard serving may be larger or smaller than 1 carbohydrate serving. To figure out how many carbohydrate servings are in the food:  First, look at the label's standard serving size.   Check the grams of total carbohydrate. This is the amount of carbohydrate in 1 standard serving.   Divide the grams of total carbohydrate by 15. This number equals the number of carbohydrate servings in 1 standard serving. Remember: 1 carbohydrate serving is 15 grams of carbohydrate.   Note: You may ignore the grams of sugars on the Nutrition Facts panel because they are included in the grams of total carbohydrate.  Foods Recommended 1 serving = about 15 grams of carbohydrate Starches  1 slice bread (1 ounce)   1 tortilla (6-inch size)    large bagel (1 ounce)   2 taco shells (5-inch size)    hamburger or hot dog bun ( ounce)    cup ready-to-eat unsweetened cereal    cup cooked cereal   1 cup broth-based soup   4 to 6 small crackers   1/3 cup pasta or rice (cooked)    cup beans, peas, corn, sweet potatoes, winter squash, or mashed or boiled potatoes (cooked)    large baked potato (3 ounces)    ounce pretzels, potato chips, or tortilla chips   3 cups popcorn (popped) Fruit  1 small fresh fruit ( to 1 cup)    cup canned or frozen fruit   2 tablespoons dried fruit (blueberries, cherries, cranberries, mixed fruit, raisins)   17 small grapes (3 ounces)   1 cup melon or berries    cup unsweetened fruit juice Milk  1 cup fat-free or reduced-fat milk   1 cup soy milk   2/3 cup (6 ounces) nonfat yogurt sweetened with sugar-free sweetener Sweets and Desserts  2-inch square cake (unfrosted)   2 small cookies (2/3 ounce)    cup ice cream or frozen yogurt    cup sherbet or sorbet   1 tablespoon syrup, jam, jelly, table  sugar, or honey   2 tablespoons light syrup Other Foods  Count 1 cup raw vegetables or  cup cooked nonstarchy vegetables as zero (0) carbohydrate servings or free foods. If you eat 3 or more servings at one meal, count them as 1 carbohydrate serving.   Foods that have less than 20 calories in each serving also may be counted as zero carbohydrate servings or free foods.   Count 1 cup of casserole or other mixed foods as 2 carbohydrate servings.  Carbohydrate Counting for People with Diabetes Sample 1-Day Menu  Breakfast 1 extra-small banana (1 carbohydrate serving)  1 cup low-fat or fat-free milk (1 carbohydrate serving)  1 slice whole wheat bread (1 carbohydrate serving)  1 teaspoon margarine  Lunch 2 ounces [place] slices  2 slices whole wheat bread (2 carbohydrate servings)  2 lettuce leaves  4 celery sticks  4 carrot sticks  1 medium apple (1 carbohydrate serving)  1 cup low-fat or fat-free milk (1 carbohydrate serving)  Afternoon Snack 2 tablespoons raisins (1 carbohydrate serving)  3/4 ounce unsalted mini pretzels (1 carbohydrate serving)  Evening Meal 3 ounces lean roast beef  1/2 large baked potato (2 carbohydrate servings)  1 tablespoon reduced-fat sour cream  1/2 cup green beans  1 tablespoon light salad dressing  1 whole wheat dinner roll (1 carbohydrate serving)  1 teaspoon margarine  1 cup melon balls (1 carbohydrate serving)  Evening Snack 2  tablespoons unsalted nuts   Carbohydrate Counting for People with Diabetes Vegan Sample 1-Day Menu  Breakfast 1 cup cooked oatmeal (2 carbohydrate servings)   cup blueberries (1 carbohydrate serving)  2 tablespoons flaxseeds  1 cup soymilk fortified with calcium and vitamin D  1 cup coffee  Lunch 2 slices whole wheat bread (2 carbohydrate servings)   cup baked tofu   cup lettuce  2 slices tomato  2 slices avocado   cup baby carrots  1 orange (1 carbohydrate serving)  1 cup soymilk fortified with calcium and  vitamin D   Evening Meal Burrito made with: 1 6-inch corn tortilla (1 carbohydrate serving)  1 cup refried vegetarian beans (1 carbohydrate serving)   cup chopped tomatoes   cup lettuce   cup salsa  1/3 cup brown rice (1 carbohydrate serving)  1 tablespoon olive oil for rice   cup zucchini   Evening Snack 6 small whole grain crackers (1 carbohydrate serving)  2 apricots ( carbohydrate serving)   cup unsalted peanuts ( carbohydrate serving)     Carbohydrate Counting for People with Diabetes Vegetarian (Lacto-Ovo) Sample 1-Day Menu  Breakfast 1 cup cooked oatmeal (2 carbohydrate servings)   cup blueberries (1 carbohydrate serving)  2 tablespoons flaxseeds  1 egg  1 cup 1% milk (1 carbohydrate serving)  1 cup coffee  Lunch 2 slices whole wheat bread (2 carbohydrate servings)  2 ounces low-fat cheese   cup lettuce  2 slices tomato  2 slices avocado   cup baby carrots  1 orange (1 carbohydrate serving)  1 cup unsweetened tea  Evening Meal Burrito made with: 1 6-inch corn tortilla (1 carbohydrate serving)   cup refried vegetarian beans (1 carbohydrate serving)   cup tomatoes   cup lettuce   cup salsa  1/3 cup brown rice (1 carbohydrate serving)  1 tablespoon olive oil for rice   cup zucchini  1 cup 1% milk (1 carbohydrate serving)  Evening Snack 6 small whole grain crackers (1 carbohydrate serving)  2 apricots ( carbohydrate serving)   cup unsalted peanuts ( carbohydrate serving)    Copyright 2020  Academy of Nutrition and Dietetics. All rights reserved.  Using Nutrition Labels: Carbohydrate   Serving Size   Look at the serving size. All the information on the label is based on this portion.  Servings Per Container   The number of servings contained in the package.  Guidelines for Carbohydrate   Look at the total grams of carbohydrate in the serving size.   1 carbohydrate choice = 15 grams of carbohydrate. Range of Carbohydrate Grams Per Choice   Carbohydrate Grams/Choice Carbohydrate Choices  6-10   11-20 1  21-25 1  26-35 2  36-40 2  41-50 3  51-55 3  56-65 4  66-70 4  71-80 5    Copyright 2020  Academy of Nutrition and Dietetics. All rights reserved.   Roslyn Smiling, MS, RD, LDN Clinical Dietitian Office phone # [phone]

## 2019-10-02 NOTE — TOC Progression Note (Signed)
Transition of Care [facility]) - Progression Note    Patient Details  Name: William [name] MRN: [id number] Date of Birth: Sep 22, 1981  [facility] CM/SW Contact  Pollie Friar, RN Phone Number: 10/02/2019, 12:04 PM  Clinical Narrative:    Met with patient about PCP. Pt states he has insurance and provided a BCBS card. CM faxed information to financial counseling.  Pt states he has a list of PCP and will arrange this on his own.  Pt has hospital f/u with ID.  TOC following for further needs prior to d/c tomorrow.   Expected Discharge Plan: OP Rehab Barriers to Discharge: Continued Medical Work up, Active Substance Use with PICC Line, Inadequate or no insurance  Expected Discharge Plan and Services Expected Discharge Plan: OP Rehab       Living arrangements for the past 2 months: Single Family Home Expected Discharge Date: 10/03/19                                     Social Determinants of Health (SDOH) Interventions    Readmission Risk Interventions No flowsheet data found.

## 2019-10-02 NOTE — Progress Notes (Signed)
[facility]   Reason for visit: Follow up on disseminated Staph aureus infection  Interval History: completing 8 weeks of IV antibiotics tomorrow with plan to continue with IV dalbavancin.  Asking about probiotics.  No acute events.    Physical Exam: Constitutional:  Vitals:   10/01/19 1941 10/02/19 0813  BP: 139/89 (!) 143/97  Pulse: 76 75  Resp: 16 16  Temp:  98.4 F (36.9 C)  SpO2: 99% 100%   patient appears in NAD Eyes: anicteric HENT: no thrush Respiratory: Normal respiratory effort; CTA B Cardiovascular: RRR GI: soft, nt, nd  Review of Systems: Constitutional: negative for fevers and chills Gastrointestinal: negative for nausea and diarrhea Integument/breast: negative for rash  Lab Results  Component Value Date   WBC 4.3 09/26/2019   HGB 11.7 (L) 09/26/2019   HCT 36.5 (L) 09/26/2019   MCV 92.9 09/26/2019   PLT 341 09/26/2019    Lab Results  Component Value Date   CREATININE 0.88 09/26/2019   BUN 24 (H) 09/26/2019   NA 140 09/26/2019   K 4.1 09/26/2019   CL 105 09/26/2019   CO2 24 09/26/2019    Lab Results  Component Value Date   ALT 12 08/31/2019   AST 15 08/31/2019   ALKPHOS 90 08/31/2019     Microbiology: No results found for this or any previous visit (from the past 240 hour(s)).  Impression/Plan:  1. MSSA bacteremia, septic pulmonary emboli - TEE did not show vegetation but presumably endocarditis.  Now has completed a prolonged duration of antibiotics.     2.  Chest wall abscess - debrided by Dr. Servando Snare on 9/3 and Dr. Prescott Gum on 9/11.  No further issues.   3.  Right shoulder septic subdeltoid and acromial bursitis and humeral osteomyelitis - he was debrided by Dr. Jeannie Fend on 10/15 and cultures were negative.  This is considered a new infection though with the known MSSA that was disseminated.  Therefore the plan has been to have him continue with IV cefazolin through 11/4 and then transition him to IV  dalbavancin for 3 weekly doses.  Will try to get his first dose the day after discharge and then weekly after that for a total of 3.    4.  Lumbar osteomyelitis with epidural abscess - He has been on prolonged IV cefazolin for this as well.  He will also continue with treatment as above.  His initial MRI was done on 9/2 and significant for epidural abscess of L2-3, disc osteomyelitis of L4-5 and L5-S1 and facet abscess.  Follow up MRI 10/18 did also note facet septic arthritis but improved abscess.  There was some disc height loss L5-S1.  His most recent ESR was 38 and CRP normal at 0.9, done on 10/28.    Will arrange follow up with [place] (Dr. Prince Rome) in 2-3 weeks.

## 2019-10-02 NOTE — Progress Notes (Signed)
Pt's midline is no longer flushing or without any blood return. RN called Iv team. IV team assessed line and they couldn't get it to work. MD made aware that pt is refusing another IV line or IM administration of antibiotics. Will continue to monitor.

## 2019-10-02 NOTE — Progress Notes (Signed)
Pt refused 12 am and 4am V/S stated that he wanted to sleep

## 2019-10-02 NOTE — Discharge Summary (Addendum)
Discharge Summary  William [name] IOE:[id number] DOB: [birth year]  PCP: Patient, No Pcp Per  Admit date: 07/31/2019 Discharge date: 10/02/2019  Time spent: 35 minutes   Recommendations for Outpatient Follow-up: -Follow-up with infectious disease(Dr. Powers) in 2-3 weeks.  -Follow-up with your primary care provider. -Take your medications as prescribed.  Discharge Diagnoses:  Active Hospital Problems   Diagnosis Date Noted  . Staphylococcus aureus bacteremia 08/02/2019  . Paraspinal abscess ([place])   . S/P chest tube placement ([place])   . Acute hematogenous osteomyelitis, right humerus ([place])   . Abscess of right heel 08/07/2019  . Acute osteomyelitis of lumbar spine ([place]) 08/07/2019  . Chest wall abscess 08/02/2019  . Sepsis ([place]) 08/01/2019  . Abscess of paraspinous muscles 08/01/2019  . Osteomyelitis of lumbar spine ([place]) 08/01/2019  . Abscess in epidural space of lumbar spine 08/01/2019  . Abnormal chest CT 08/01/2019  . Septic embolism ([place]) 08/01/2019  . Community acquired pneumonia 08/01/2019  . IV drug abuse ([place]) 08/01/2019  . Heroin abuse ([place]) 08/01/2019  . Elevated troponin 08/01/2019  . UTI (urinary tract infection) 08/01/2019  . Renal failure 08/01/2019  . Hyperglycemia 08/01/2019  . Suspected endocarditis 08/01/2019    Resolved Hospital Problems  No resolved problems to display.    Discharge Condition: Stable  Diet recommendation: Heart healthy carb modified diet.  Vitals:   10/02/19 0813 10/02/19 1156  BP: (!) 143/97 (!) 128/92  Pulse: 75 76  Resp: 16 16  Temp: 98.4 F (36.9 C) (!) 97.5 F (36.4 C)  SpO2: 100% 99%    History of present illness:  Mr. William [name] is a38 y.o.M with IVDA who presented with pain in the low back, left shoulder and right foot, as well as flu-like symptoms, progressing over a few days.  In the ER, imaging showed right heel abscess, left upper chest abscess and septic emboli. Blood cultures obtained, started on empiric  antibiotics, and admitted for presumptive endocarditis and for chest wall debridement.  Interim history: 9/2 patient admitted from ER to ICU, underwent debridement of chest wall abscess in the OR, placement of wound vac. MRI lumbar spine showed paraspinal abscess (2x2x4), epidural abscesses, sacral osteomyleitis and lumbar vertebral osteomyelitis/discitis Urine, blood and wound cultures from admission all growing MSSA but thankfully repeat blood cultures 9/3, 9/6, and 9/10 all without growth Ankle abscess I&D'd on 9/4 Transitioned to nafcillin on 9/7 Right shoulder bursa aspirated on 08/10/19.  Patient defervesced and continued on nafcillin MRI pelvis and MRI right shoulder obtained on 10/11-13 showed progression of lumbar/sacral osteo, shrinkage but persistence of paraspinal abscess and newly noted osteomyelitis with abscess of the right humeral head.  Post I&D R shoulder on 09/13/19.  Cefazolin infusion start date 09/12/19;  end date 10/03/19.  Seen by ID, Dr. Linus [name], on 10/02/19 for follow up on disseminated staph aureus infection.  10/02/19: Patient was seen and examined at his bedside this morning.  No acute events overnight.  He has no new complaints.  He declined routine blood work this morning.  Vital signs and previous labs reviewed and are stable.   Patient will be discharged to home on 10/03/2019 after completing his last IV antibiotic infusion and when approved by MD attending physician to discharge on 10/03/19.  Patient has requested to be discharged before 11 AM.    Hospital Course:  Principal Problem:   Staphylococcus aureus bacteremia Active Problems:   Sepsis ([place])   Abscess of paraspinous muscles   Osteomyelitis of lumbar spine (H[facility])   Abscess in  epidural space of lumbar spine   Abnormal chest CT   Septic embolism ([facility])   Community acquired pneumonia   IV drug abuse ([employer])   Heroin abuse ([facility])   Elevated troponin   UTI (urinary tract infection)   Renal  failure   Hyperglycemia   Suspected endocarditis   Chest wall abscess   Abscess of right heel   Acute osteomyelitis of lumbar spine (HCC)   Paraspinal abscess (H[facility])   S/P chest tube placement (H[facility])   Acute hematogenous osteomyelitis, right humerus (HCC)  MSSA bacteremia, septic pulmonary emboli - TEE did not show vegetation but presumably endocarditis.  Now has completed a prolonged duration of antibiotics.     Chest wall abscess - debrided by Dr. Servando [name] on 9/3 and Dr. Prescott [name] on 9/11.  No further issues.   Right shoulder septic subdeltoid and acromial bursitis and humeral osteomyelitis - he was debrided by Dr. Jeannie [name] on 10/15 and cultures were negative.  This is considered a new infection though with the known MSSA that was disseminated.  Therefore the plan has been to have him continue with IV cefazolin through 11/4 and then transition him to IV dalbavancin for 3 weekly doses.  Will try to get his first dose the day after discharge and then weekly after that for a total of 3.  Flexeril prn for muscle spasms.  Lumbar osteomyelitis with epidural abscess - He has been on prolonged IV cefazolin for this as well.  He will also continue with treatment as above.  His initial MRI was done on 9/2 and significant for epidural abscess of L2-3, disc osteomyelitis of L4-5 and L5-S1 and facet abscess.  Follow up MRI 10/18 did also note facet septic arthritis but improved abscess.  There was some disc height loss L5-S1.  His most recent ESR was 38 and CRP normal at 0.9, done on 10/28.    Newly diagnosed type II diabetes with hyperglycemia Glucose 700 at admission. No previous diagnosis of diabetes. No family history of diabetes. Not obese. A1c here 7.8%. Metformin 500 mg twice daily at discharge Follow-up with your PCP  History of IV drug abuse -Completed telephonicpsychologycounseling. -Drug-free while inpatient and very receptive and motivated to remain drug free. -Hepatitis C  screening nonreactive, HIV nonreactive on 08/01/2019 He is determined to keep his sobriety.  He has refrained from using his pain medications, has not used as needed oxycodone for quite some time.  He would like to return to work and maintain his sobriety.  Resolving anemia of chronic disease -Hemoglobin 11.7 on 09/26/2019 from 9.0 on 09/19/2019 without blood transfusion.  Hypertension Blood pressure is at goal -Continuemetoprolol 12.5 mg twice daily Follow-up with your PCP  Other medications -Continuetrazodone as needed for insomnia Follow-up with your PCP  Physical debility PT OT evaluated and recommended outpatient rehab. Continue fall precautions   Consultations:  Infectious disease  Discharge Exam: BP (!) 128/92 (BP Location: Left Wrist)   Pulse 76   Temp (!) 97.5 F (36.4 C) (Oral)   Resp 16   Ht 6' 2.02" (1.88 m)   Wt 79.9 kg   SpO2 99%   BMI 22.61 kg/m  . General: 38 y.o. year-old male well developed well nourished in no acute distress.  Alert and oriented x4. . Cardiovascular: Regular rate and rhythm with no rubs or gallops.  No thyromegaly or JVD noted.   Marland Kitchen Respiratory: Clear to auscultation with no wheezes or rales. Good inspiratory effort. . Abdomen: Soft nontender nondistended with normal  bowel sounds x4 quadrants. . Musculoskeletal: No lower extremity edema. 2/4 pulses in all 4 extremities. Marland Kitchen Psychiatry: Mood is appropriate for condition and setting  Discharge Instructions You were cared for by a hospitalist during your hospital stay. If you have any questions about your discharge medications or the care you received while you were in the hospital after you are discharged, you can call the unit and asked to speak with the hospitalist on call if the hospitalist that took care of you is not available. Once you are discharged, your primary care physician will handle any further medical issues. Please note that NO REFILLS for any discharge medications will be  authorized once you are discharged, as it is imperative that you return to your primary care physician (or establish a relationship with a primary care physician if you do not have one) for your aftercare needs so that they can reassess your need for medications and monitor your lab values.   Allergies as of 10/02/2019   No Known Allergies     Medication List    STOP taking these medications   diclofenac 50 MG tablet Commonly known as: CATAFLAM   metaxalone 800 MG tablet Commonly known as: SKELAXIN   predniSONE 50 MG tablet Commonly known as: DELTASONE   traMADol 50 MG tablet Commonly known as: ULTRAM     TAKE these medications   cyclobenzaprine 5 MG tablet Commonly known as: FLEXERIL Take 1 tablet (5 mg total) by mouth daily as needed for muscle spasms.   metFORMIN 500 MG tablet Commonly known as: Glucophage Take 1 tablet (500 mg total) by mouth 2 (two) times daily with a meal.   metoprolol tartrate 25 MG tablet Commonly known as: LOPRESSOR Take 0.5 tablets (12.5 mg total) by mouth 2 (two) times daily.   multivitamin with minerals Tabs tablet Take 1 tablet by mouth daily.   traZODone 50 MG tablet Commonly known as: DESYREL Take 1 tablet (50 mg total) by mouth at bedtime as needed for sleep.      No Known Allergies Follow-up Information    [facility]. Call in 1 day(s).   Why: Please call for a post hospital follow-up appointment. Contact information: [place] [address] [phone]       Carlyle Basques, MD. Call in 1 day(s).   Specialty: Infectious Diseases Why: Please call for a post hospital follow up appointment. Contact information: [address] [place] [address] [phone]            The results of significant diagnostics from this hospitalization (including imaging, microbiology, ancillary and laboratory) are listed below for reference.     Significant Diagnostic Studies: Dg Chest 2 View  Result Date: 09/18/2019 CLINICAL DATA:  Recent shoulder surgery for infection. Rule out pneumonia EXAM: CHEST - 2 VIEW COMPARISON:  08/21/2019 FINDINGS: Skin staples and probable antibiotic beads over the proximal right humerus. There is no edema, consolidation, effusion, or pneumothorax. Normal heart size and mediastinal contours. IMPRESSION: No evidence of cardiopulmonary disease. Electronically Signed   By: Monte Fantasia M.D.   On: 09/18/2019 11:44   Mr Lumbar Spine W Wo Contrast  Result Date: 09/16/2019 CLINICAL DATA:  Lumbar epidural abscess EXAM: MRI LUMBAR SPINE WITHOUT AND WITH CONTRAST TECHNIQUE: Multiplanar and multiecho pulse sequences of the lumbar spine were obtained without and with intravenous contrast. CONTRAST:  28m GADAVIST GADOBUTROL 1 MMOL/ML IV SOLN COMPARISON:  MRI 08/01/2019 FINDINGS: Segmentation:  Standard. Alignment:  Physiologic. Vertebrae: Marrow edema and enhancement with low T1 signal changes are again seen within the L5 and S1 vertebral bodies compatible with known osteomyelitis. There is decreased fluid signal within the L5-S1 disc compared to prior. Slight interval disc height loss compared to prior. The remaining osseous structures are within normal limits. Conus medullaris and cauda equina: Conus extends to the L1 level. Conus and cauda equina appear normal. Paraspinal and other soft tissues: Right paraspinal intramuscular edema with small residual peripherally enhancing fluid collection posterior to the right L4-5 facet joint measures 0.7 x 0.6 x 3.2 cm (series 16, image 25), previously 2.3 x 2.2 x 4.5 cm. Collection communicates with the right L4-5 facet joint with associated marrow edema and enhancement of the superior and articular process (series 19, image 27; series 18, image 4). No psoas muscle edema or fluid. Disc levels: The previously seen epidural abscess has resolved. No residual epidural fluid is evident.  L1-L2: No significant disc protrusion, foraminal stenosis, or canal stenosis. L2-L3: No significant disc protrusion, foraminal stenosis, or canal stenosis. L3-L4: No significant disc protrusion, foraminal stenosis, or canal stenosis. L4-L5: Mild diffuse disc bulge and endplate spurring resulting in mild bilateral foraminal stenosis. There is soft tissue enhancement within the right neural foramen, less pronounced compared to prior. There is no canal stenosis. L5-S1: Abnormal enhancement within the disc with progressive disc height loss compared to prior. There is moderate bilateral foraminal stenosis. No canal stenosis. IMPRESSION: 1. Septic arthritis with osteomyelitis centered at the right L4-5 facet joint, progressed from prior. The associated communicating soft tissue abscess in the right paraspinal musculature has significantly decreased in size compared to prior. 2. Soft tissue enhancement within the right L4-5 neural foramina, likely reactive secondary to inflammatory process within the adjacent facet joint. 3. L5-S1 discitis-osteomyelitis with progressive disc height loss compared to prior. There is less abnormal fluid signal within the L5-S1 disc. No interval vertebral body collapse. 4. Complete resolution of the previously seen epidural abscess. These results will be called to the ordering clinician or representative by the Radiologist Assistant, and communication documented in the PACS or zVision Dashboard. Electronically Signed   By: Davina Poke M.D.   On: 09/16/2019 15:22   Mr Pelvis W Wo Contrast  Result Date: 09/09/2019 CLINICAL DATA:  Right groin and sacroiliac joint pain. History of IV drug use with osteomyelitis discitis and epidural abscess. EXAM: MRI PELVIS WITHOUT AND WITH CONTRAST TECHNIQUE: Multiplanar multisequence MR imaging of the pelvis was performed both before and after administration of intravenous contrast. CONTRAST:  73m GADAVIST GADOBUTROL 1 MMOL/ML IV SOLN COMPARISON:  MRI  lumbar spine dated August 01, 2019. FINDINGS: Bones: L5-S1 osteomyelitis discitis again noted with progressive disc height loss and endplate irregularity. Osteomyelitis also involves the left sacral ala, similar to prior MRI. Epidural abscess in the lower lumbar spine has resolved with residual inflammatory changes and phlegmon in the right lateral epidural space at L4-L5 and L5-S1, extending into the right L5-S1 and S1 neural foramina. New septic arthritis and osteomyelitis of the right L4-L5 facet joint with continued adjacent paravertebral inflammatory changes and interval decrease in size of the now small 0.7 x 0.7 x 3.1 cm abscess communicating with the joint space, previously 2.3 x 2.2 x 4.5 cm. The sacroiliac joints and symphysis pubis are unremarkable. No acute fracture or dislocation. Articular cartilage and labrum Articular cartilage: No focal chondral defect or subchondral signal abnormality identified. Labrum: Grossly intact, although evaluation is limited due to lack of intra-articular fluid. No paralabral  abnormality. Joint or bursal effusion Joint effusion: No significant hip joint effusion. Bursae: No focal periarticular fluid collection. Muscles and tendons Muscles and tendons: The visualized gluteus, hamstring and iliopsoas tendons appear normal. No additional muscle edema. No muscle atrophy. Other findings Miscellaneous: The visualized internal pelvic contents appear unremarkable. IMPRESSION: 1. New septic arthritis and osteomyelitis of the right L4-L5 facet joint with continued adjacent paravertebral inflammatory changes, but interval decrease in size of the now small 0.7 x 0.7 x 3.1 cm abscess communicating with the joint space, previously 2.3 x 2.2 x 4.5 cm. 2. Progressive osteomyelitis discitis at L5-S1 with increased disc height loss and endplate irregularity. Unchanged extension of osteomyelitis into the left sacral ala. The sacroiliac joints are unremarkable. 3. Resolved epidural abscess  in the visualized lower lumbar spine with residual inflammatory changes and phlegmon in the right lateral epidural space at L4-L5 and L5-S1, extending into the right L5-S1 and S1 neural foramina. Electronically Signed   By: Titus Dubin M.D.   On: 09/09/2019 20:15   Mr Humerus Right W Wo Contrast  Result Date: 09/13/2019 CLINICAL DATA:  Osteomyelitis of the humerus. EXAM: MRI OF THE RIGHT HUMERUS WITHOUT AND WITH CONTRAST TECHNIQUE: Multiplanar, multisequence MR imaging of the right humerus was performed before and after the administration of intravenous contrast. CONTRAST:  7.52m GADAVIST GADOBUTROL 1 MMOL/ML IV SOLN COMPARISON:  Right shoulder dated 09/11/2019 FINDINGS: Bones/Joint/Cartilage There is osteomyelitis from the humeral head to the distal humeral metaphysis with an intramedullary abscess extending over a 14 cm segment of the shaft. Muscles and Tendons There is edema and abnormal enhancement of the deltoid muscle overlying the septic bursitis described on the prior shoulder MRI. Soft tissues Septic subacromial/subdeltoid bursitis noted in the right shoulder as described on the shoulder MRI of 09/11/2019. This is essentially unchanged. IMPRESSION: Extensive osteomyelitis of the right humerus extending from the humeral head into the distal humeral metaphysis with an intramedullary abscess as described above. Septic subacromial/subdeltoid bursitis of the right shoulder. Electronically Signed   By: JLorriane ShireM.D.   On: 09/13/2019 07:58   Mr Shoulder Right W Wo Contrast  Result Date: 09/11/2019 CLINICAL DATA:  Right shoulder pain. Sepsis. Staph infection of the chest wall. EXAM: MRI OF THE RIGHT SHOULDER WITHOUT AND WITH CONTRAST TECHNIQUE: Multiplanar, multisequence MR imaging of the right shoulder was performed before and after the administration of intravenous contrast. CONTRAST:  833mGADAVIST GADOBUTROL 1 MMOL/ML IV SOLN COMPARISON:  CT scan dated 08/09/2019 FINDINGS: Rotator cuff: There  is a deep partial-thickness bursal surface tear of the distal supraspinatus tendon 3 cm in width and a maximum of 13 mm in length. The remainder of the rotator cuff is intact. Muscles:  No atrophy. Biceps long head:  Properly located and intact. Acromioclavicular Joint: Normal AC joint. Type 2 acromion. Prominent inhomogeneous fluid in the subacromial and subdeltoid bursae with intense enhancement of the margins of the bursa after contrast administration. Glenohumeral Joint: No glenohumeral joint effusion or chondral defect. Labrum:  Intact. Bones: Extensive edema and abnormal enhancement of the majority of the humeral head and in the proximal shaft with a rim enhancing fluid collection in the proximal humeral shaft. The finding is consistent with extensive osteomyelitis of the proximal humerus. Other: After contrast administration there is enhancement along the periphery of the supraspinatus and infraspinatus muscles, probably representing infection spread along the fascia. IMPRESSION: 1. Septic subacromial/subdeltoid bursitis with a deep partial-thickness bursal surface tear of the distal supraspinatus tendon. 2. Extensive osteomyelitis of the proximal  right humerus with an abscess in the medullary cavity proximal humeral shaft. 3. At this point there is no evidence of septic arthritis of the glenohumeral joint or of the [facility] joint. Electronically Signed   By: Lorriane Shire M.D.   On: 09/11/2019 20:58    Microbiology: No results found for this or any previous visit (from the past 240 hour(s)).   Labs: Basic Metabolic Panel: Recent Labs  Lab 09/26/19 0908  NA 140  K 4.1  CL 105  CO2 24  GLUCOSE 126*  BUN 24*  CREATININE 0.88  CALCIUM 9.4  MG 2.0   Liver Function Tests: No results for input(s): AST, ALT, ALKPHOS, BILITOT, PROT, ALBUMIN in the last 168 hours. No results for input(s): LIPASE, AMYLASE in the last 168 hours. No results for input(s): AMMONIA in the last 168 hours. CBC: Recent  Labs  Lab 09/26/19 0908  WBC 4.3  HGB 11.7*  HCT 36.5*  MCV 92.9  PLT 341   Cardiac Enzymes: No results for input(s): CKTOTAL, CKMB, CKMBINDEX, TROPONINI in the last 168 hours. BNP: BNP (last 3 results) No results for input(s): BNP in the last 8760 hours.  ProBNP (last 3 results) No results for input(s): PROBNP in the last 8760 hours.  CBG: Recent Labs  Lab 10/01/19 1111 10/01/19 1624 10/01/19 2122 10/02/19 0629 10/02/19 1158  GLUCAP 153* 98 208* 97 145*       Signed:  Kayleen Memos, MD [facility] 10/02/2019, 12:25 PM

## 2019-10-02 NOTE — Progress Notes (Signed)
ID Pharmacy Progress Note   William [name] is scheduled for an IV infusion of Dalbavancin 1500 mg on Thursday 11/5 at 1 PM. I spoke with him about this appointment and he is aware.   We will plan 2 more weekly infusion of 500 mg of dalbavancin after this first infusion to complete therapy for his MSSA.    William [name] is insured with the billing information below:   [facility]   ID: [id number] [phone] RxBin: [id number]    Jimmy Footman, PharmD, BCPS, BCIDP Infectious Diseases Clinical Pharmacist Phone: [phone] 10/02/2019 3:11 PM

## 2019-10-03 NOTE — TOC Transition Note (Signed)
Transition of Care [facility]) - CM/SW Discharge Note   Patient Details  Name: William [name] MRN: [id number] Date of Birth: 20-Nov-1981  Transition of [facility]) CM/SW Contact:  Pollie Friar, RN Phone Number: 10/03/2019, 11:15 AM   Clinical Narrative:    Pt discharging home with outpatient therapy at [facility].  Pts insurance showing in [place].  Pt has transportation home.   Final next level of care: OP Rehab Barriers to Discharge: No Barriers Identified   Patient Goals and CMS Choice Patient states their goals for this hospitalization and ongoing recovery are:: to get well and get back home   Choice offered to / list presented to : Patient  Discharge Placement                       Discharge Plan and Services                                     Social Determinants of Health (SDOH) Interventions     Readmission Risk Interventions No flowsheet data found.

## 2019-10-03 NOTE — Progress Notes (Signed)
RN not able to give patient his IV antibiotics due to the fact that patient does not have IV access and is refusing to have new IV or medline placed.

## 2019-10-03 NOTE — Discharge Summary (Addendum)
Discharge Summary addended on 10/03/2019  William [name] BTY:[id number] DOB: 04/19/1981  PCP: Patient, No Pcp Per  Admit date: 07/31/2019 Discharge date: 10/03/2019  Time spent: 35 minutes   Recommendations for Outpatient Follow-up: -Follow-up with infectious disease(Dr. Powers) in 2-3 weeks.  -Follow-up with your primary care provider. -Take your medications as prescribed.  Discharge Diagnoses:  Active Hospital Problems   Diagnosis Date Noted   Staphylococcus aureus bacteremia 08/02/2019   Paraspinal abscess (HCC)    S/P chest tube placement [facility])    Acute hematogenous osteomyelitis, right humerus (HCC)    Abscess of right heel 08/07/2019   Acute osteomyelitis of lumbar spine ([place]) 08/07/2019   Chest wall abscess 08/02/2019   Sepsis ([place]) 08/01/2019   Abscess of paraspinous muscles 08/01/2019   Osteomyelitis of lumbar spine ([place]) 08/01/2019   Abscess in epidural space of lumbar spine 08/01/2019   Abnormal chest CT 08/01/2019   Septic embolism ([place]) 08/01/2019   Community acquired pneumonia 08/01/2019   IV drug abuse ([place]) 08/01/2019   Heroin abuse ([place]) 08/01/2019   Elevated troponin 08/01/2019   UTI (urinary tract infection) 08/01/2019   Renal failure 08/01/2019   Hyperglycemia 08/01/2019   Suspected endocarditis 08/01/2019    Resolved Hospital Problems  No resolved problems to display.    Discharge Condition: Stable  Diet recommendation: Heart healthy carb modified diet.  Vitals:   10/03/19 0834 10/03/19 0835  BP: (!) 129/104 (!) 144/96  Pulse: 93 76  Resp: 16   Temp: (!) 97.5 F (36.4 C)   SpO2: 100%     History of present illness:  William [name] is a37 y.o.M with IVDA who presented with pain in the low back, left shoulder and right foot, as well as flu-like symptoms, progressing over a few days.  In the ER, imaging showed right heel abscess, left upper chest abscess and septic emboli. Blood cultures obtained, started on empiric  antibiotics, and admitted for presumptive endocarditis and for chest wall debridement.  Interim history: 9/2 patient admitted from ER to ICU, underwent debridement of chest wall abscess in the OR, placement of wound vac. MRI lumbar spine showed paraspinal abscess (2x2x4), epidural abscesses, sacral osteomyleitis and lumbar vertebral osteomyelitis/discitis Urine, blood and wound cultures from admission all growing MSSA but thankfully repeat blood cultures 9/3, 9/6, and 9/10 all without growth Ankle abscess I&D'd on 9/4 Transitioned to nafcillin on 9/7 Right shoulder bursa aspirated on 08/10/19.  Patient defervesced and continued on nafcillin MRI pelvis and MRI right shoulder obtained on 10/11-13 showed progression of lumbar/sacral osteo, shrinkage but persistence of paraspinal abscess and newly noted osteomyelitis with abscess of the right humeral head.  Post I&D R shoulder on 09/13/19.  Cefazolin infusion start date 09/12/19;  end date 10/03/19.  Seen by ID, Dr. Linus Salmons, on 10/02/19 for follow up on disseminated staph aureus infection.  Patient has stabilized for discharge and will need follow-up in the outpatient setting with ID   Hospital Course:  Principal Problem:   Staphylococcus aureus bacteremia Active Problems:   Sepsis ([place])   Abscess of paraspinous muscles   Osteomyelitis of lumbar spine (HCC)   Abscess in epidural space of lumbar spine   Abnormal chest CT   Septic embolism (HCC)   Community acquired pneumonia   IV drug abuse ([place])   Heroin abuse ([place])   Elevated troponin   UTI (urinary tract infection)   Renal failure   Hyperglycemia   Suspected endocarditis   Chest wall abscess   Abscess of right heel  Acute osteomyelitis of lumbar spine (HCC)   Paraspinal abscess ([place])   S/P chest tube placement ([place])   Acute hematogenous osteomyelitis, right humerus (HCC)  MSSA bacteremia, septic pulmonary emboli - TEE did not show vegetation but presumably endocarditis.   Now has completed a prolonged duration of antibiotics.     Chest wall abscess - debrided by Dr. Servando Snare on 9/3 and Dr. Prescott Gum on 9/11.  No further issues.   Right shoulder septic subdeltoid and acromial bursitis and humeral osteomyelitis - he was debrided by Dr. Jeannie Fend on 10/15 and cultures were negative.  This is considered a new infection though with the known MSSA that was disseminated.  Therefore the plan has been to have him continue with IV cefazolin through 11/4 and then transition him to IV dalbavancin for 3 weekly doses.  Will try to get his first dose the day after discharge and then weekly after that for a total of 3.  Flexeril prn for muscle spasms.  Lumbar osteomyelitis with epidural abscess - He has been on prolonged IV cefazolin for this as well.  He will also continue with treatment as above.  His initial MRI was done on 9/2 and significant for epidural abscess of L2-3, disc osteomyelitis of L4-5 and L5-S1 and facet abscess.  Follow up MRI 10/18 did also note facet septic arthritis but improved abscess.  There was some disc height loss L5-S1.  His most recent ESR was 38 and CRP normal at 0.9, done on 10/28.    Newly diagnosed type II diabetes with hyperglycemia Glucose 700 at admission. No previous diagnosis of diabetes. No family history of diabetes. Not obese. A1c here 7.8%. Metformin 500 mg twice daily at discharge Follow-up with your PCP  History of IV drug abuse -Completed telephonicpsychologycounseling. -Drug-free while inpatient and very receptive and motivated to remain drug free. -Hepatitis C screening nonreactive, HIV nonreactive on 08/01/2019 He is determined to keep his sobriety.  He has refrained from using his pain medications, has not used as needed oxycodone for quite some time.  He would like to return to work and maintain his sobriety.  Resolving anemia of chronic disease -Hemoglobin 11.7 on 09/26/2019 from 9.0 on 09/19/2019 without blood  transfusion.  Hypertension Blood pressure is at goal -Continuemetoprolol 12.5 mg twice daily Follow-up with your PCP  Other medications -Continuetrazodone as needed for insomnia Follow-up with your PCP  Physical debility PT OT evaluated and recommended outpatient rehab. Continue fall precautions   Consultations:  Infectious disease  Discharge Exam: BP (!) 144/96    Pulse 76    Temp (!) 97.5 F (36.4 C) (Oral)    Resp 16    Ht 6' 2.02" (1.88 m)    Wt 79.9 kg    SpO2 100%    BMI 22.61 kg/m  General: 38 y.o. year-old male well developed well nourished in no awake alert coherent EOMI NCAT moving well Abdomen soft nontender no rebound no guarding Range of motion intact although slightly limited to bending on the left side  Discharge Instructions You were cared for by a hospitalist during your hospital stay. If you have any questions about your discharge medications or the care you received while you were in the hospital after you are discharged, you can call the unit and asked to speak with the hospitalist on call if the hospitalist that took care of you is not available. Once you are discharged, your primary care physician will handle any further medical issues. Please note that NO REFILLS  for any discharge medications will be authorized once you are discharged, as it is imperative that you return to your primary care physician (or establish a relationship with a primary care physician if you do not have one) for your aftercare needs so that they can reassess your need for medications and monitor your lab values.  Discharge Instructions    Ambulatory referral to Occupational Therapy   Complete by: As directed    Ambulatory referral to Physical Therapy   Complete by: As directed    Discharge patient   Complete by: As directed    Discharge disposition: 01-Home or Self Care   Discharge patient date: 10/03/2019     Allergies as of 10/03/2019   No Known Allergies      Medication List    STOP taking these medications   diclofenac 50 MG tablet Commonly known as: CATAFLAM   metaxalone 800 MG tablet Commonly known as: SKELAXIN   predniSONE 50 MG tablet Commonly known as: DELTASONE   traMADol 50 MG tablet Commonly known as: ULTRAM     TAKE these medications   cyclobenzaprine 5 MG tablet Commonly known as: FLEXERIL Take 1 tablet (5 mg total) by mouth daily as needed for muscle spasms.   metFORMIN 500 MG tablet Commonly known as: Glucophage Take 1 tablet (500 mg total) by mouth 2 (two) times daily with a meal.   metoprolol tartrate 25 MG tablet Commonly known as: LOPRESSOR Take 0.5 tablets (12.5 mg total) by mouth 2 (two) times daily.   multivitamin with minerals Tabs tablet Take 1 tablet by mouth daily.   traZODone 50 MG tablet Commonly known as: DESYREL Take 1 tablet (50 mg total) by mouth at bedtime as needed for sleep.      No Known Allergies Follow-up Information    [facility]. Call in 1 day(s).   Why: Please call for a post hospital follow-up appointment. Contact information: [place] [address] [phone]       Carlyle Basques, MD. Call in 1 day(s).   Specialty: Infectious Diseases Why: Please call for a post hospital follow up appointment. Contact information: [address] [place] [address] [phone]        [facility] Follow up.   Specialty: Rehabilitation Why: The outpatient rehab will contact you for the first home visit Contact information: [address] [place] [phone]           The results of significant diagnostics from this hospitalization (including imaging, microbiology, ancillary and laboratory) are listed below for reference.    Significant Diagnostic Studies: Dg Chest 2 View  Result Date:  09/18/2019 CLINICAL DATA:  Recent shoulder surgery for infection. Rule out pneumonia EXAM: CHEST - 2 VIEW COMPARISON:  08/21/2019 FINDINGS: Skin staples and probable antibiotic beads over the proximal right humerus. There is no edema, consolidation, effusion, or pneumothorax. Normal heart size and mediastinal contours. IMPRESSION: No evidence of cardiopulmonary disease. Electronically Signed   By: Monte Fantasia M.D.   On: 09/18/2019 11:44   Mr Lumbar Spine W Wo Contrast  Result Date: 09/16/2019 CLINICAL DATA:  Lumbar epidural abscess EXAM: MRI LUMBAR SPINE WITHOUT AND WITH CONTRAST TECHNIQUE: Multiplanar and multiecho pulse sequences of the lumbar spine were obtained without and with intravenous contrast. CONTRAST:  76m GADAVIST GADOBUTROL 1 MMOL/ML IV SOLN COMPARISON:  MRI 08/01/2019 FINDINGS: Segmentation:  Standard. Alignment:  Physiologic. Vertebrae: Marrow edema and enhancement  with low T1 signal changes are again seen within the L5 and S1 vertebral bodies compatible with known osteomyelitis. There is decreased fluid signal within the L5-S1 disc compared to prior. Slight interval disc height loss compared to prior. The remaining osseous structures are within normal limits. Conus medullaris and cauda equina: Conus extends to the L1 level. Conus and cauda equina appear normal. Paraspinal and other soft tissues: Right paraspinal intramuscular edema with small residual peripherally enhancing fluid collection posterior to the right L4-5 facet joint measures 0.7 x 0.6 x 3.2 cm (series 16, image 25), previously 2.3 x 2.2 x 4.5 cm. Collection communicates with the right L4-5 facet joint with associated marrow edema and enhancement of the superior and articular process (series 19, image 27; series 18, image 4). No psoas muscle edema or fluid. Disc levels: The previously seen epidural abscess has resolved. No residual epidural fluid is evident. L1-L2: No significant disc protrusion, foraminal stenosis, or canal  stenosis. L2-L3: No significant disc protrusion, foraminal stenosis, or canal stenosis. L3-L4: No significant disc protrusion, foraminal stenosis, or canal stenosis. L4-L5: Mild diffuse disc bulge and endplate spurring resulting in mild bilateral foraminal stenosis. There is soft tissue enhancement within the right neural foramen, less pronounced compared to prior. There is no canal stenosis. L5-S1: Abnormal enhancement within the disc with progressive disc height loss compared to prior. There is moderate bilateral foraminal stenosis. No canal stenosis. IMPRESSION: 1. Septic arthritis with osteomyelitis centered at the right L4-5 facet joint, progressed from prior. The associated communicating soft tissue abscess in the right paraspinal musculature has significantly decreased in size compared to prior. 2. Soft tissue enhancement within the right L4-5 neural foramina, likely reactive secondary to inflammatory process within the adjacent facet joint. 3. L5-S1 discitis-osteomyelitis with progressive disc height loss compared to prior. There is less abnormal fluid signal within the L5-S1 disc. No interval vertebral body collapse. 4. Complete resolution of the previously seen epidural abscess. These results will be called to the ordering clinician or representative by the Radiologist Assistant, and communication documented in the PACS or zVision Dashboard. Electronically Signed   By: Davina Poke M.D.   On: 09/16/2019 15:22   Mr Pelvis W Wo Contrast  Result Date: 09/09/2019 CLINICAL DATA:  Right groin and sacroiliac joint pain. History of IV drug use with osteomyelitis discitis and epidural abscess. EXAM: MRI PELVIS WITHOUT AND WITH CONTRAST TECHNIQUE: Multiplanar multisequence MR imaging of the pelvis was performed both before and after administration of intravenous contrast. CONTRAST:  56m GADAVIST GADOBUTROL 1 MMOL/ML IV SOLN COMPARISON:  MRI lumbar spine dated August 01, 2019. FINDINGS: Bones: L5-S1  osteomyelitis discitis again noted with progressive disc height loss and endplate irregularity. Osteomyelitis also involves the left sacral ala, similar to prior MRI. Epidural abscess in the lower lumbar spine has resolved with residual inflammatory changes and phlegmon in the right lateral epidural space at L4-L5 and L5-S1, extending into the right L5-S1 and S1 neural foramina. New septic arthritis and osteomyelitis of the right L4-L5 facet joint with continued adjacent paravertebral inflammatory changes and interval decrease in size of the now small 0.7 x 0.7 x 3.1 cm abscess communicating with the joint space, previously 2.3 x 2.2 x 4.5 cm. The sacroiliac joints and symphysis pubis are unremarkable. No acute fracture or dislocation. Articular cartilage and labrum Articular cartilage: No focal chondral defect or subchondral signal abnormality identified. Labrum: Grossly intact, although evaluation is limited due to lack of intra-articular fluid. No paralabral abnormality. Joint or bursal effusion Joint  effusion: No significant hip joint effusion. Bursae: No focal periarticular fluid collection. Muscles and tendons Muscles and tendons: The visualized gluteus, hamstring and iliopsoas tendons appear normal. No additional muscle edema. No muscle atrophy. Other findings Miscellaneous: The visualized internal pelvic contents appear unremarkable. IMPRESSION: 1. New septic arthritis and osteomyelitis of the right L4-L5 facet joint with continued adjacent paravertebral inflammatory changes, but interval decrease in size of the now small 0.7 x 0.7 x 3.1 cm abscess communicating with the joint space, previously 2.3 x 2.2 x 4.5 cm. 2. Progressive osteomyelitis discitis at L5-S1 with increased disc height loss and endplate irregularity. Unchanged extension of osteomyelitis into the left sacral ala. The sacroiliac joints are unremarkable. 3. Resolved epidural abscess in the visualized lower lumbar spine with residual  inflammatory changes and phlegmon in the right lateral epidural space at L4-L5 and L5-S1, extending into the right L5-S1 and S1 neural foramina. Electronically Signed   By: Titus Dubin M.D.   On: 09/09/2019 20:15   Mr Humerus Right W Wo Contrast  Result Date: 09/13/2019 CLINICAL DATA:  Osteomyelitis of the humerus. EXAM: MRI OF THE RIGHT HUMERUS WITHOUT AND WITH CONTRAST TECHNIQUE: Multiplanar, multisequence MR imaging of the right humerus was performed before and after the administration of intravenous contrast. CONTRAST:  7.53m GADAVIST GADOBUTROL 1 MMOL/ML IV SOLN COMPARISON:  Right shoulder dated 09/11/2019 FINDINGS: Bones/Joint/Cartilage There is osteomyelitis from the humeral head to the distal humeral metaphysis with an intramedullary abscess extending over a 14 cm segment of the shaft. Muscles and Tendons There is edema and abnormal enhancement of the deltoid muscle overlying the septic bursitis described on the prior shoulder MRI. Soft tissues Septic subacromial/subdeltoid bursitis noted in the right shoulder as described on the shoulder MRI of 09/11/2019. This is essentially unchanged. IMPRESSION: Extensive osteomyelitis of the right humerus extending from the humeral head into the distal humeral metaphysis with an intramedullary abscess as described above. Septic subacromial/subdeltoid bursitis of the right shoulder. Electronically Signed   By: JLorriane ShireM.D.   On: 09/13/2019 07:58   Mr Shoulder Right W Wo Contrast  Result Date: 09/11/2019 CLINICAL DATA:  Right shoulder pain. Sepsis. Staph infection of the chest wall. EXAM: MRI OF THE RIGHT SHOULDER WITHOUT AND WITH CONTRAST TECHNIQUE: Multiplanar, multisequence MR imaging of the right shoulder was performed before and after the administration of intravenous contrast. CONTRAST:  818mGADAVIST GADOBUTROL 1 MMOL/ML IV SOLN COMPARISON:  CT scan dated 08/09/2019 FINDINGS: Rotator cuff: There is a deep partial-thickness bursal surface tear of  the distal supraspinatus tendon 3 cm in width and a maximum of 13 mm in length. The remainder of the rotator cuff is intact. Muscles:  No atrophy. Biceps long head:  Properly located and intact. Acromioclavicular Joint: Normal AC joint. Type 2 acromion. Prominent inhomogeneous fluid in the subacromial and subdeltoid bursae with intense enhancement of the margins of the bursa after contrast administration. Glenohumeral Joint: No glenohumeral joint effusion or chondral defect. Labrum:  Intact. Bones: Extensive edema and abnormal enhancement of the majority of the humeral head and in the proximal shaft with a rim enhancing fluid collection in the proximal humeral shaft. The finding is consistent with extensive osteomyelitis of the proximal humerus. Other: After contrast administration there is enhancement along the periphery of the supraspinatus and infraspinatus muscles, probably representing infection spread along the fascia. IMPRESSION: 1. Septic subacromial/subdeltoid bursitis with a deep partial-thickness bursal surface tear of the distal supraspinatus tendon. 2. Extensive osteomyelitis of the proximal right humerus with an abscess in  the medullary cavity proximal humeral shaft. 3. At this point there is no evidence of septic arthritis of the glenohumeral joint or of the [facility] joint. Electronically Signed   By: Lorriane Shire M.D.   On: 09/11/2019 20:58    Microbiology: No results found for this or any previous visit (from the past 240 hour(s)).   Labs: Basic Metabolic Panel: No results for input(s): NA, K, CL, CO2, GLUCOSE, BUN, CREATININE, CALCIUM, MG, PHOS in the last 168 hours. Liver Function Tests: No results for input(s): AST, ALT, ALKPHOS, BILITOT, PROT, ALBUMIN in the last 168 hours. No results for input(s): LIPASE, AMYLASE in the last 168 hours. No results for input(s): AMMONIA in the last 168 hours. CBC: No results for input(s): WBC, NEUTROABS, HGB, HCT, MCV, PLT in the last 168  hours. Cardiac Enzymes: No results for input(s): CKTOTAL, CKMB, CKMBINDEX, TROPONINI in the last 168 hours. BNP: BNP (last 3 results) No results for input(s): BNP in the last 8760 hours.  ProBNP (last 3 results) No results for input(s): PROBNP in the last 8760 hours.  CBG: Recent Labs  Lab 10/01/19 2122 10/02/19 0629 10/02/19 1158 10/02/19 1606 10/02/19 2111  GLUCAP 208* 97 145* 148* 142*       Signed:  Nita Sells, MD [facility] 10/03/2019, 10:25 AM

## 2019-10-04 ENCOUNTER — Ambulatory Visit (HOSPITAL_COMMUNITY)
Admission: RE | Admit: 2019-10-04 | Discharge: 2019-10-04 | Disposition: A | Discharge status: Home / Self Care | Payer: BC Managed Care – PPO | Source: Ambulatory Visit | Attending: Internal Medicine | Admitting: Internal Medicine

## 2019-10-04 ENCOUNTER — Other Ambulatory Visit: Payer: Self-pay

## 2019-10-04 DIAGNOSIS — G062 Extradural and subdural abscess, unspecified: Secondary | ICD-10-CM | POA: Diagnosis not present

## 2019-10-04 DIAGNOSIS — M869 Osteomyelitis, unspecified: Secondary | ICD-10-CM | POA: Diagnosis not present

## 2019-10-04 MED ORDER — DEXTROSE 5 % IV SOLN
1500.0000 mg | Freq: Once | INTRAVENOUS | Status: AC
  Administered 2019-10-04: 14:00:00 1500 mg via INTRAVENOUS
  Filled 2019-10-04: qty 75

## 2019-10-04 MED ORDER — DALBAVANCIN HCL 500 MG IV SOLR: 1500.0000 mg | Freq: Once | INTRAVENOUS | Status: DC

## 2019-10-04 NOTE — Discharge Instructions (Signed)
Dalbavancin injection What is this medicine? DALBAVANCIN (DAL ba van sin) is an antibiotic. It is used to treat certain kinds of bacterial infections. It will not work for colds, flu, or other viral infections. This medicine may be used for other purposes; ask your health care provider or pharmacist if you have questions. COMMON BRAND NAME(S): DALVANCE What should I tell my health care provider before I take this medicine? They need to know if you have any of these conditions:  intestine problems, like colitis  an unusual or allergic reaction to dalbavancin, other medicines, foods, dyes, or preservatives  pregnant or trying to get pregnant  breast-feeding How should I use this medicine? This medicine is infused into a vein. It is usually given by a health care professional in a hospital or clinic setting. If you get this medicine at home, you will be taught how to prepare and give this medicine. Use exactly as directed. Take your medicine at regular intervals. Do not take your medicine more often than directed. It is important that you put your used needles and syringes in a special sharps container. Do not put them in a trash can. If you do not have a sharps container, call your pharmacist or healthcare provider to get one. Talk to your pediatrician regarding the use of this medicine in children. Special care may be needed. Overdosage: If you think you have taken too much of this medicine contact a poison control center or emergency room at once. NOTE: This medicine is only for you. Do not share this medicine with others. What if I miss a dose? It is important not to miss your dose. Call your doctor or health care professional if you are unable to keep an appointment. If you give yourself the medicine and you miss a dose, take it as soon as you can. If it is almost time for your next dose, take only that dose. Do not take double or extra doses. What may interact with this medicine? This  medicine may interact with the following medications:  birth control pills This list may not describe all possible interactions. Give your health care provider a list of all the medicines, herbs, non-prescription drugs, or dietary supplements you use. Also tell them if you smoke, drink alcohol, or use illegal drugs. Some items may interact with your medicine. What should I watch for while using this medicine? Tell your doctor or healthcare professional if your symptoms do not start to get better or if they get worse. Do not treat diarrhea with over the counter products. Contact your doctor if you have diarrhea that lasts more than 2 days or if it is severe and watery. What side effects may I notice from receiving this medicine? Side effects that you should report to your doctor or health care professional as soon as possible:  allergic reactions like skin rash, itching or hives, swelling of the face, lips, or tongue  bloody or watery diarrhea Side effects that usually do not require medical attention (report to your doctor or health care professional if they continue or are bothersome):  diarrhea  headache  nausea, vomiting This list may not describe all possible side effects. Call your doctor for medical advice about side effects. You may report side effects to FDA at 1-800-FDA-1088. Where should I keep my medicine? Keep out of the reach of children. This drug is usually given in a hospital or clinic and will not be stored at home. In rare cases, this medicine may   be given at home. If you are using this medicine at home, you will be instructed on how to store this medicine. Throw away any unused medicine after the expiration date on the label. NOTE: This sheet is a summary. It may not cover all possible information. If you have questions about this medicine, talk to your doctor, pharmacist, or health care provider.  2020 Elsevier/Gold Standard (2015-12-18 08:38:08)  

## 2019-10-11 ENCOUNTER — Other Ambulatory Visit: Payer: Self-pay

## 2019-10-11 ENCOUNTER — Ambulatory Visit (HOSPITAL_COMMUNITY)
Admission: RE | Admit: 2019-10-11 | Discharge: 2019-10-11 | Disposition: A | Discharge status: Home / Self Care | Payer: BC Managed Care – PPO | Source: Ambulatory Visit | Attending: Internal Medicine | Admitting: Internal Medicine

## 2019-10-11 DIAGNOSIS — G062 Extradural and subdural abscess, unspecified: Secondary | ICD-10-CM | POA: Insufficient documentation

## 2019-10-11 DIAGNOSIS — M4626 Osteomyelitis of vertebra, lumbar region: Secondary | ICD-10-CM | POA: Insufficient documentation

## 2019-10-11 MED ORDER — DALBAVANCIN HCL 500 MG IV SOLR: 500.0000 mg | Freq: Once | INTRAVENOUS | Status: DC

## 2019-10-11 MED ORDER — DEXTROSE 5 % IV SOLN
500.0000 mg | Freq: Once | INTRAVENOUS | Status: AC
  Administered 2019-10-11: 500 mg via INTRAVENOUS
  Filled 2019-10-11: qty 25

## 2019-10-15 ENCOUNTER — Other Ambulatory Visit: Payer: Self-pay

## 2019-10-15 ENCOUNTER — Ambulatory Visit: Payer: BC Managed Care – PPO | Attending: Internal Medicine | Admitting: Occupational Therapy

## 2019-10-15 DIAGNOSIS — M25511 Pain in right shoulder: Secondary | ICD-10-CM | POA: Diagnosis not present

## 2019-10-15 DIAGNOSIS — M6281 Muscle weakness (generalized): Secondary | ICD-10-CM | POA: Diagnosis not present

## 2019-10-15 DIAGNOSIS — M25611 Stiffness of right shoulder, not elsewhere classified: Secondary | ICD-10-CM | POA: Insufficient documentation

## 2019-10-15 NOTE — Therapy (Signed)
[facility] [address] [place], [place], [address] Phone: [phone]   Fax:  [phone]  Occupational Therapy Evaluation  Patient Details  Name: William [name] MRN: [id number] Date of Birth: Nov 20, 1981 Referring Provider (OT): Dr. Dow Adolph   Encounter Date: 10/15/2019  OT End of Session - 10/15/19 1250    Visit Number  1    Number of Visits  8    Date for OT Re-Evaluation  12/15/19    Authorization Type  BC/BS    OT Start Time  1100    OT Stop Time  1140    OT Time Calculation (min)  40 min    Activity Tolerance  Patient tolerated treatment well    Behavior During Therapy  [facility] for tasks assessed/performed       Past Medical History:  Diagnosis Date  . Staph infection 07/2019   CHEST WALL    Past Surgical History:  Procedure Laterality Date  . APPLICATION OF WOUND VAC N/A 08/10/2019   Procedure: WOUND VAC CHANGE;  Surgeon: Kerin Perna, MD;  Location: [facility] OR;  Service: Thoracic;  Laterality: N/A;  . I&D EXTREMITY Right 09/13/2019   Procedure: IRRIGATION AND DEBRIDEMENT EXTREMITY;  Surgeon: Ernest Mallick, MD;  [facility] OR;  Service: Orthopedics;  Laterality: Right;  . STERNAL WOUND DEBRIDEMENT Left 08/01/2019   Procedure: I&D Left Chest wall abscess with application of wound vac;  Surgeon: Delight Ovens, MD;  Location: [facility] OR;  Service: Thoracic;  Laterality: Left;  . STERNAL WOUND DEBRIDEMENT N/A 08/10/2019   Procedure: WOUND DEBRIDEMENT;  Surgeon: Donata Clay, Theron Arista, MD;  Location: [facility] OR;  Service: Thoracic;  Laterality: N/A;  . TEE WITHOUT CARDIOVERSION N/A 08/01/2019   Procedure: TRANSESOPHAGEAL ECHOCARDIOGRAM (TEE);  Surgeon: Delight Ovens, MD;  Location: [facility] OR;  Service: Thoracic;  Laterality: N/A;    There were no vitals filed for this visit.  Subjective Assessment - 10/15/19 1108    Subjective   I don't think I need any therapy for my back at this time, only my shoulder.     Pertinent History  Pt went into hospital on 07/31/19 w/ sepsis and ARF, abcess in epidural space of lumbar spine, osteomyelitis of lumbar spine however pt reports back is doing ok. Pt also had osteomyelitis of Rt humerus and septic subdeltoid and acromial bursitis. Pt had 2 surgeries to chest in September and surgery to Rt shoulder 09/13/19 including subdeltoid and subacromial bursa radical excision, saucerization of Rt proximal humerus and humeral shaft    Limitations  NWB RUE -  after Rt shoulder sx 09/13/19, OK for A/ROM, P/ROM, and light strengthening per Dr. Roney Mans (orthopedic surgeron) v.o. on 10/15/19, but NWB for at least 6 weeks post-op per pt.    Currently in Pain?  Yes    Pain Score  3     Pain Location  Shoulder   at incision site   Pain Orientation  Right    Pain Descriptors / Indicators  Sore    Pain Type  Acute pain    Pain Onset  More than a month ago    Pain Frequency  Intermittent    Aggravating Factors   w/ high level ROM    Pain Relieving Factors  rest        [facility] OT Assessment - 10/15/19 0001      Assessment   Medical Diagnosis  s/p Rt shoulder surgery   Also had sepsis, ARF, osteomyelitis of lumbar  spine & Rt sh   Referring Provider (OT)  Dr. Dow Adolph    Onset Date/Surgical Date  09/13/19   for Rt shoulder surgery   Hand Dominance  Right      Precautions   Precautions  Other (comment)    Precaution Comments  NWB RUE, Ok for A/ROM, P/ROM and light strengthening to Rt shoulder per Dr. Roney Mans v.o. on 10/15/19      Restrictions   Weight Bearing Restrictions  Yes    RUE Weight Bearing  Non weight bearing      Balance Screen   Has the patient fallen in the past 6 months  No      Home  Environment   Bathroom Shower/Tub  Tub/Shower unit    Lives With  --   Girlfriend     Prior Function   Level of Independence  Independent    Vocation  Full time employment    [employer]    Leisure  plays guitar and saxophone       ADL   Eating/Feeding  Independent    Grooming  Independent    Best boy  Modified independent    Toileting - Recruitment consultant -  Geneticist, molecular  Independent      IADL   Shopping  --   girlfriend typically does   Light Housekeeping  Performs light daily tasks such as dishwashing, bed making;Does personal laundry completely    Meal Prep  Plans, prepares and serves adequate meals independently    Education officer, environmental own Geophysical data processor Status  Independent      Written Expression   Dominant Hand  Right    Handwriting  --   denies change     Vision - History   Baseline Vision  No visual deficits    Additional Comments  denies change      Cognition   Overall Cognitive Status  Within Functional Limits for tasks assessed    Cognition Comments  denies change      Observation/Other Assessments   Observations  Pt walking w/o difficulty and reports no balance deficits or need for therapy to back      Sensation   Light Touch  Appears Intact      Coordination   Gross Motor Movements are Fluid and Coordinated  Yes    9 Hole Peg Test  Right;Left    Right 9 Hole Peg Test  21.22 sec      Edema   Edema  none      ROM / Strength   AROM / PROM / Strength  AROM;Strength      AROM   Overall AROM Comments  RUE sh flex = 115*, abd = 105*, ER approx 90%, IR approx 75% w/ pain 5/10. LUE mild limitations w/ abd and ER (pt also had premorbid Lt shoulder deficits from partial deltoid removed)       Strength   Overall Strength Comments  not formally assessed      Hand Function   Right Hand Grip (lbs)  101 lbs    Left Hand Grip (lbs)  98 lbs  OT Short Term Goals - 10/15/19 1258      OT SHORT TERM GOAL #1   Title   Independent with HEP    Time  4    Period  Weeks    Status  New      OT SHORT TERM GOAL #2   Title  Pt to demo 125* shoulder flexion for overhead reaching    Time  4    Period  Weeks    Status  New      OT SHORT TERM GOAL #3   Title  Pt to perform Rt shoulder abduction to 115* for side reaching    Time  4    Period  Weeks    Status  New      OT SHORT TERM GOAL #4   Title  Pt to demo sufficient IR to tuck shirt and don jacket w/ pain less than or equal to 3/10    Time  4    Period  Weeks    Status  New        OT Long Term Goals - 10/15/19 1300      OT LONG TERM GOAL #1   Title  Independent with updated strengthening HEP    Time  8    Period  Weeks    Status  New      OT LONG TERM GOAL #2   Title  Pt to place 3 lb weight on overhead shelf RUE 5/5 trials    Time  8    Period  Weeks    Status  New      OT LONG TERM GOAL #3   Title  Pt to perform functional wt bearing through RUE (once cleared) for sit to stand and supine to sit functional movements    Time  8    Period  Weeks    Status  New      OT LONG TERM GOAL #4   Title  Pt to demo sufficient shoulder ROM for all functional high level tasks    Time  8    Period  Weeks    Status  New            Plan - 10/15/19 1252    Clinical Impression Statement  Pt is a 38 y.o. male who presents to outpatient O.T. s/p sepsis and osteomyelitis. Pt however has recovered quite well from referring diagnosis but only needs therapy for Rt shoulder at this time. Pt went into hospital on 07/31/19 w/ sepsis and ARF, abcess in epidural space of lumbar spine, osteomyelitis of lumbar spine however pt reports back is doing ok. Pt also had osteomyelitis of Rt humerus and septic subdeltoid and acromial bursitis. Pt had 2 surgeries to chest in September and surgery to Rt shoulder 09/13/19 including subdeltoid and subacromial bursa radical excision, saucerization of Rt proximal humerus and humeral shaft. Pt now with shoulder ROM limitations  and currently NWB RUE    OT Occupational Profile and History  Detailed Assessment- Review of Records and additional review of physical, cognitive, psychosocial history related to current functional performance    Occupational performance deficits (Please refer to evaluation for details):  ADL's;IADL's;Work;Leisure    Body Structure / Function / Physical Skills  ROM;IADL;Scar mobility;Body mechanics;Endurance;Skin integrity;Strength;UE functional use;Pain    Rehab Potential  Good    Clinical Decision Making  Several treatment options, min-mod task modification necessary    Comorbidities Affecting Occupational Performance:  May have comorbidities impacting occupational performance  Modification or Assistance to Complete Evaluation   No modification of tasks or assist necessary to complete eval    OT Frequency  1x / week    OT Duration  8 weeks    OT Treatment/Interventions  Self-care/ADL training;Therapeutic exercise;Neuromuscular education;Manual Therapy;Ultrasound;Therapeutic activities;Cryotherapy;DME and/or AE instruction;Scar mobilization;Moist Heat;Passive range of motion;Patient/family education    Plan  Update shoulder HEP prn (pt received shoulder ex's from hospital)    Recommended Other Services  No P.T. for balance or back needed at this time    Consulted and Agree with Plan of Care  Patient       Patient will benefit from skilled therapeutic intervention in order to improve the following deficits and impairments:   Body Structure / Function / Physical Skills: ROM, IADL, Scar mobility, Body mechanics, Endurance, Skin integrity, Strength, UE functional use, Pain       Visit Diagnosis: Stiffness of right shoulder, not elsewhere classified  Acute pain of right shoulder  Muscle weakness (generalized)    Problem List Patient Active Problem List   Diagnosis Date Noted  . Paraspinal abscess ([place])   . S/P chest tube placement ([place])   . Acute hematogenous osteomyelitis, right  humerus ([place])   . Abscess of right heel 08/07/2019  . Acute osteomyelitis of lumbar spine ([place]) 08/07/2019  . Staphylococcus aureus bacteremia 08/02/2019  . Chest wall abscess 08/02/2019  . Sepsis ([place]) 08/01/2019  . Abscess of paraspinous muscles 08/01/2019  . Osteomyelitis of lumbar spine ([place]) 08/01/2019  . Abscess in epidural space of lumbar spine 08/01/2019  . Abnormal chest CT 08/01/2019  . Septic embolism ([place]) 08/01/2019  . Community acquired pneumonia 08/01/2019  . IV drug abuse ([place]) 08/01/2019  . Heroin abuse ([place]) 08/01/2019  . Elevated troponin 08/01/2019  . UTI (urinary tract infection) 08/01/2019  . Renal failure 08/01/2019  . Hyperglycemia 08/01/2019  . Suspected endocarditis 08/01/2019    Carey Bullocks, OTR/L 10/15/2019, 1:03 PM  [facility] [address] [place], [place], [address] Phone: [phone]   Fax:  [phone]  Name: William [name] MRN: [id number] Date of Birth: 10-05-1981

## 2019-10-16 ENCOUNTER — Ambulatory Visit: Payer: BC Managed Care – PPO

## 2019-10-18 ENCOUNTER — Ambulatory Visit (HOSPITAL_COMMUNITY)
Admission: RE | Admit: 2019-10-18 | Discharge: 2019-10-18 | Disposition: A | Discharge status: Home / Self Care | Payer: BC Managed Care – PPO | Source: Ambulatory Visit | Attending: Internal Medicine | Admitting: Internal Medicine

## 2019-10-18 DIAGNOSIS — M4626 Osteomyelitis of vertebra, lumbar region: Secondary | ICD-10-CM | POA: Insufficient documentation

## 2019-10-18 DIAGNOSIS — G062 Extradural and subdural abscess, unspecified: Secondary | ICD-10-CM | POA: Insufficient documentation

## 2019-10-18 MED ORDER — DEXTROSE 5 % IV SOLN
500.0000 mg | Freq: Once | INTRAVENOUS | Status: AC
  Administered 2019-10-18: 500 mg via INTRAVENOUS
  Filled 2019-10-18: qty 25

## 2019-10-18 MED ORDER — DALBAVANCIN HCL 500 MG IV SOLR: 500.0000 mg | Freq: Once | INTRAVENOUS | Status: DC

## 2019-10-23 ENCOUNTER — Other Ambulatory Visit: Payer: Self-pay

## 2019-10-23 ENCOUNTER — Encounter: Payer: Self-pay | Admitting: Occupational Therapy

## 2019-10-23 ENCOUNTER — Ambulatory Visit: Payer: BC Managed Care – PPO | Admitting: Occupational Therapy

## 2019-10-23 DIAGNOSIS — M25611 Stiffness of right shoulder, not elsewhere classified: Secondary | ICD-10-CM

## 2019-10-23 DIAGNOSIS — M25511 Pain in right shoulder: Secondary | ICD-10-CM

## 2019-10-23 DIAGNOSIS — M6281 Muscle weakness (generalized): Secondary | ICD-10-CM | POA: Diagnosis not present

## 2019-10-23 NOTE — Patient Instructions (Signed)
Lying on your back: 1)  Goal post - Arms out to a 90 degree angle - rotate forward and backward.  Humerus stays in contact with the bed Focus - to not allow shoulder to rise up off the surface (internal/ external rotation)  2)  Shoulder Abduction - snow angel   3)  Shoulder Flexion - start with arm next to body - elbow straight (actively) raise arm up so elbow moves towards ear- thumb pointing upward  4)  Massage scar in direction of scar - can use vit e or cocoa butter to help lubricant - healing for scar.  Can cross friction rub as well

## 2019-10-23 NOTE — Therapy (Signed)
[facility] [address] [place], [place], [address] Phone: [phone]   Fax:  [phone]  Occupational Therapy Treatment  Patient Details  Name: William [name] MRN: [id number] Date of Birth: 03/14/1981 Referring Provider (OT): Dr. Dow Adolph   Encounter Date: 10/23/2019  OT End of Session - 10/23/19 1803    Visit Number  2    Number of Visits  8    Date for OT Re-Evaluation  12/15/19    Authorization Type  BC/BS    OT Start Time  1700    OT Stop Time  1743    OT Time Calculation (min)  43 min    Activity Tolerance  Patient tolerated treatment well    Behavior During Therapy  [facility] for tasks assessed/performed       Past Medical History:  Diagnosis Date  . Staph infection 07/2019   CHEST WALL    Past Surgical History:  Procedure Laterality Date  . APPLICATION OF WOUND VAC N/A 08/10/2019   Procedure: WOUND VAC CHANGE;  Surgeon: Kerin Perna, MD;  Location: [facility] OR;  Service: Thoracic;  Laterality: N/A;  . I&D EXTREMITY Right 09/13/2019   Procedure: IRRIGATION AND DEBRIDEMENT EXTREMITY;  Surgeon: Ernest Mallick, MD;  Location: [facility] OR;  Service: Orthopedics;  Laterality: Right;  . STERNAL WOUND DEBRIDEMENT Left 08/01/2019   Procedure: I&D Left Chest wall abscess with application of wound vac;  Surgeon: Delight Ovens, MD;  Location: [facility] OR;  Service: Thoracic;  Laterality: Left;  . STERNAL WOUND DEBRIDEMENT N/A 08/10/2019   Procedure: WOUND DEBRIDEMENT;  Surgeon: Donata Clay, Theron Arista, MD;  Location: [facility] OR;  Service: Thoracic;  Laterality: N/A;  . TEE WITHOUT CARDIOVERSION N/A 08/01/2019   Procedure: TRANSESOPHAGEAL ECHOCARDIOGRAM (TEE);  Surgeon: Delight Ovens, MD;  Location: [facility] OR;  Service: Thoracic;  Laterality: N/A;    There were no vitals filed for this visit.  Subjective Assessment - 10/23/19 1707    Subjective   I felt a clicking in my shoulder    Currently in Pain?  No/denies    Pain Score   0-No pain                   OT Treatments/Exercises (OP) - 10/23/19 0001      ADLs   ADL Comments  Reviewed short and long term goals with patient.  Patient in agreement,      Neurological Re-education Exercises   Other Exercises 1  Supine perfomred stretch to shoulder capsule to promote increased motion toward shoulder flexion (with elbow extension) shoulder abduction, and internal / external rotation. Patient able to understand joint mechanics and attend to details regarding correct form for motions.  Initiated HEP      Manual Therapy   Manual Therapy  Soft tissue mobilization    Soft tissue mobilization  Scar well approximated - performend scar massage.  Educated patient on scar management               OT Short Term Goals - 10/23/19 1804      OT SHORT TERM GOAL #1   Title  Independent with HEP    Status  On-going      OT SHORT TERM GOAL #2   Title  Pt to demo 125* shoulder flexion for overhead reaching    Status  On-going      OT SHORT TERM GOAL #3   Title  Pt to perform Rt shoulder abduction to 115* for  side reaching    Status  On-going      OT SHORT TERM GOAL #4   Title  Pt to demo sufficient IR to tuck shirt and don jacket w/ pain less than or equal to 3/10    Status  On-going        OT Long Term Goals - 10/23/19 1713      OT LONG TERM GOAL #1   Title  Independent with updated strengthening HEP    Status  On-going      OT LONG TERM GOAL #2   Title  Pt to place 3 lb weight on overhead shelf RUE 5/5 trials    Status  On-going      OT LONG TERM GOAL #3   Title  Pt to perform functional wt bearing through RUE (once cleared) for sit to stand and supine to sit functional movements    Status  On-going      OT LONG TERM GOAL #4   Title  Pt to demo sufficient shoulder ROM for all functional high level tasks    Status  On-going            Plan - 10/23/19 1803    Clinical Impression Statement  Patient is showing less pain, and improved  movement in right shoulder from evaluation.  Patient eager for improved use of RUE.    OT Frequency  1x / week    OT Duration  8 weeks    OT Treatment/Interventions  Self-care/ADL training;Therapeutic exercise;Neuromuscular education;Manual Therapy;Ultrasound;Therapeutic activities;Cryotherapy;DME and/or AE instruction;Scar mobilization;Moist Heat;Passive range of motion;Patient/family education    Plan  Upright shoulder exercises, review supine exercises       Patient will benefit from skilled therapeutic intervention in order to improve the following deficits and impairments:           Visit Diagnosis: Stiffness of right shoulder, not elsewhere classified  Acute pain of right shoulder  Muscle weakness (generalized)    Problem List Patient Active Problem List   Diagnosis Date Noted  . Paraspinal abscess ([place])   . S/P chest tube placement ([place])   . Acute hematogenous osteomyelitis, right humerus ([place])   . Abscess of right heel 08/07/2019  . Acute osteomyelitis of lumbar spine ([place]) 08/07/2019  . Staphylococcus aureus bacteremia 08/02/2019  . Chest wall abscess 08/02/2019  . Sepsis ([place]) 08/01/2019  . Abscess of paraspinous muscles 08/01/2019  . Osteomyelitis of lumbar spine ([place]) 08/01/2019  . Abscess in epidural space of lumbar spine 08/01/2019  . Abnormal chest CT 08/01/2019  . Septic embolism ([place]) 08/01/2019  . Community acquired pneumonia 08/01/2019  . IV drug abuse ([place]) 08/01/2019  . Heroin abuse ([place]) 08/01/2019  . Elevated troponin 08/01/2019  . UTI (urinary tract infection) 08/01/2019  . Renal failure 08/01/2019  . Hyperglycemia 08/01/2019  . Suspected endocarditis 08/01/2019    Mariah Milling, OTR/L 10/23/2019, 6:05 PM  [facility] [address] [place], [place], [address] Phone: [phone]   Fax:  [phone]  Name: William [name] MRN: [id number] Date of Birth: Apr 09, 1981

## 2019-10-29 ENCOUNTER — Ambulatory Visit: Payer: BC Managed Care – PPO | Admitting: Occupational Therapy

## 2019-10-29 ENCOUNTER — Encounter: Payer: Self-pay | Admitting: Occupational Therapy

## 2019-10-29 ENCOUNTER — Other Ambulatory Visit: Payer: Self-pay

## 2019-10-29 DIAGNOSIS — M6281 Muscle weakness (generalized): Secondary | ICD-10-CM | POA: Diagnosis not present

## 2019-10-29 DIAGNOSIS — M25511 Pain in right shoulder: Secondary | ICD-10-CM | POA: Diagnosis not present

## 2019-10-29 DIAGNOSIS — M25611 Stiffness of right shoulder, not elsewhere classified: Secondary | ICD-10-CM

## 2019-10-29 NOTE — Therapy (Signed)
[facility] [address] [place], [place], [address] Phone: [phone]   Fax:  [phone]  Occupational Therapy Treatment  Patient Details  Name: William [name] MRN: [id number] Date of Birth: 1981/01/22 Referring Provider (OT): Dr. Irene Pap   Encounter Date: 10/29/2019  OT End of Session - 10/29/19 1838    Visit Number  3    Number of Visits  8    Date for OT Re-Evaluation  12/15/19    Authorization Type  BC/BS    OT Start Time  0923    OT Stop Time  1830    OT Time Calculation (min)  46 min    Activity Tolerance  Patient tolerated treatment well    Behavior During Therapy  [facility] for tasks assessed/performed       Past Medical History:  Diagnosis Date  . Staph infection 07/2019   CHEST WALL    Past Surgical History:  Procedure Laterality Date  . APPLICATION OF WOUND VAC N/A 08/10/2019   Procedure: WOUND VAC CHANGE;  Surgeon: Ivin Poot, MD;  Location: [place];  Service: Thoracic;  Laterality: N/A;  . I&D EXTREMITY Right 09/13/2019   Procedure: IRRIGATION AND DEBRIDEMENT EXTREMITY;  Surgeon: Verner Mould, MD;  Location: [place];  Service: Orthopedics;  Laterality: Right;  . STERNAL WOUND DEBRIDEMENT Left 08/01/2019   Procedure: I&D Left Chest wall abscess with application of wound vac;  Surgeon: Grace Isaac, MD;  Location: [facility];  Service: Thoracic;  Laterality: Left;  . STERNAL WOUND DEBRIDEMENT N/A 08/10/2019   Procedure: WOUND DEBRIDEMENT;  Surgeon: Prescott Gum, Collier Salina, MD;  Location: [place];  Service: Thoracic;  Laterality: N/A;  . TEE WITHOUT CARDIOVERSION N/A 08/01/2019   Procedure: TRANSESOPHAGEAL ECHOCARDIOGRAM (TEE);  Surgeon: Grace Isaac, MD;  Location: [facility] OR;  Service: Thoracic;  Laterality: N/A;    There were no vitals filed for this visit.  Subjective Assessment - 10/29/19 1746    Subjective   I did stop taking muscle relaxant - don't seem to need it    Currently in Pain?   No/denies    Pain Score  0-No pain                   OT Treatments/Exercises (OP) - 10/29/19 0001      Shoulder Exercises: Standing   Other Standing Exercises  Shoulder internal rotation with dowel in standing - AAROM      Manual Therapy   Manual therapy comments  Continue with gentle passive range of motion, gentle traction and joint mobilization to increase active range in shoulder joint.      Soft tissue mobilization  Scar massage- patient managong scar well             OT Education - 10/29/19 1837    Education Details  added shoulder internal rotation AAROM    Person(s) Educated  Patient    Methods  Explanation;Demonstration;Tactile cues;Verbal cues;Handout    Comprehension  Verbalized understanding;Returned demonstration       OT Short Term Goals - 10/29/19 1749      OT SHORT TERM GOAL #2   Title  Pt to demo 125* shoulder flexion for overhead reaching    Status  Achieved   135     OT SHORT TERM GOAL #3   Title  Pt to perform Rt shoulder abduction to 115* for side reaching    Status  Achieved   125  OT Long Term Goals - 10/23/19 1713      OT LONG TERM GOAL #1   Title  Independent with updated strengthening HEP    Status  On-going      OT LONG TERM GOAL #2   Title  Pt to place 3 lb weight on overhead shelf RUE 5/5 trials    Status  On-going      OT LONG TERM GOAL #3   Title  Pt to perform functional wt bearing through RUE (once cleared) for sit to stand and supine to sit functional movements    Status  On-going      OT LONG TERM GOAL #4   Title  Pt to demo sufficient shoulder ROM for all functional high level tasks    Status  On-going            Plan - 10/29/19 1838    Clinical Impression Statement  Patient is progressing nicely with less pain and improved range of motion.  Patient has met at least two short term goals.    OT Frequency  1x / week    OT Duration  8 weeks    OT Treatment/Interventions  Self-care/ADL  training;Therapeutic exercise;Neuromuscular education;Manual Therapy;Ultrasound;Therapeutic activities;Cryotherapy;DME and/or AE instruction;Scar mobilization;Moist Heat;Passive range of motion;Patient/family education    Plan  continued focus to increase end range shoulder flex/abd/horiz add, IR/ER - Should be able to add strengthening    OT Home Exercise Plan  shoulder supine and standing exercises    Consulted and Agree with Plan of Care  Patient       Patient will benefit from skilled therapeutic intervention in order to improve the following deficits and impairments:           Visit Diagnosis: Stiffness of right shoulder, not elsewhere classified  Acute pain of right shoulder  Muscle weakness (generalized)    Problem List Patient Active Problem List   Diagnosis Date Noted  . Paraspinal abscess ([place])   . S/P chest tube placement ([place])   . Acute hematogenous osteomyelitis, right humerus ([place])   . Abscess of right heel 08/07/2019  . Acute osteomyelitis of lumbar spine ([place]) 08/07/2019  . Staphylococcus aureus bacteremia 08/02/2019  . Chest wall abscess 08/02/2019  . Sepsis ([place]) 08/01/2019  . Abscess of paraspinous muscles 08/01/2019  . Osteomyelitis of lumbar spine ([place]) 08/01/2019  . Abscess in epidural space of lumbar spine 08/01/2019  . Abnormal chest CT 08/01/2019  . Septic embolism ([place]) 08/01/2019  . Community acquired pneumonia 08/01/2019  . IV drug abuse ([place]) 08/01/2019  . Heroin abuse ([place]) 08/01/2019  . Elevated troponin 08/01/2019  . UTI (urinary tract infection) 08/01/2019  . Renal failure 08/01/2019  . Hyperglycemia 08/01/2019  . Suspected endocarditis 08/01/2019    Mariah Milling, OTR/L 10/29/2019, [facility] [address] [place], [place], [address] Phone: [phone]   Fax:  [phone]  Name: William [name] MRN: [id number] Date of Birth: 23-Aug-1981

## 2019-10-29 NOTE — Patient Instructions (Signed)
Access Code: [email or web address]  URL: [email or web address]  Date: 10/29/2019  Prepared by: Marlowe Sax   Exercises  Standing Shoulder Internal Rotation AAROM with Dowel - 10 reps - 3 sets - 1x daily - 7x weekly  Standing Shoulder Internal Rotation Stretch with Towel - 10 reps - 3 sets - 1x daily - 7x weekly

## 2019-11-06 DIAGNOSIS — Z4789 Encounter for other orthopedic aftercare: Secondary | ICD-10-CM | POA: Diagnosis not present

## 2019-11-08 ENCOUNTER — Ambulatory Visit: Payer: BC Managed Care – PPO | Admitting: Occupational Therapy

## 2019-11-13 ENCOUNTER — Ambulatory Visit: Payer: BC Managed Care – PPO | Attending: Internal Medicine | Admitting: Occupational Therapy

## 2019-11-13 ENCOUNTER — Other Ambulatory Visit: Payer: Self-pay

## 2019-11-13 DIAGNOSIS — M25611 Stiffness of right shoulder, not elsewhere classified: Secondary | ICD-10-CM | POA: Diagnosis not present

## 2019-11-13 DIAGNOSIS — M6281 Muscle weakness (generalized): Secondary | ICD-10-CM | POA: Insufficient documentation

## 2019-11-13 DIAGNOSIS — M25511 Pain in right shoulder: Secondary | ICD-10-CM | POA: Insufficient documentation

## 2019-11-13 NOTE — Patient Instructions (Signed)
Access Code: [email or web address]  URL: [email or web address]  Date: 11/13/2019  Prepared by: Marlowe Sax   Exercises  Shoulder External Rotation with Anchored Resistance - 10 reps - 3 sets - 1x daily - 7x weekly  Shoulder Internal Rotation with Resistance - 10 reps - 3 sets - 1x daily - 7x weekly  Isometric Shoulder Internal Rotation - 10 reps - 3 sets - 1x daily - 7x weekly

## 2019-11-13 NOTE — Therapy (Signed)
[facility] [address] [place], [place], [address] Phone: [phone]   Fax:  [phone]  Occupational Therapy Treatment  Patient Details  Name: William [name] MRN: [id number] Date of Birth: [birth year] Referring Provider (OT): Dr. Irene Pap   Encounter Date: 11/13/2019  OT End of Session - 11/13/19 1802    Visit Number  4    Number of Visits  8    Date for OT Re-Evaluation  12/15/19    Authorization Type  BC/BS    OT Start Time  1508    OT Stop Time  1548    OT Time Calculation (min)  40 min    Activity Tolerance  Patient tolerated treatment well    Behavior During Therapy  [facility] for tasks assessed/performed       Past Medical History:  Diagnosis Date  . Staph infection 07/2019   CHEST WALL    Past Surgical History:  Procedure Laterality Date  . APPLICATION OF WOUND VAC N/A 08/10/2019   Procedure: WOUND VAC CHANGE;  Surgeon: Ivin Poot, MD;  Location: [place];  Service: Thoracic;  Laterality: N/A;  . I&D EXTREMITY Right 09/13/2019   Procedure: IRRIGATION AND DEBRIDEMENT EXTREMITY;  Surgeon: Verner Mould, MD;  Location: [facility] [place];  Service: Orthopedics;  Laterality: Right;  . STERNAL WOUND DEBRIDEMENT Left 08/01/2019   Procedure: I&D Left Chest wall abscess with application of wound vac;  Surgeon: Grace Isaac, MD;  Location: [place];  Service: Thoracic;  Laterality: Left;  . STERNAL WOUND DEBRIDEMENT N/A 08/10/2019   Procedure: WOUND DEBRIDEMENT;  Surgeon: Prescott Gum, Collier Salina, MD;  Location: [place];  Service: Thoracic;  Laterality: N/A;  . TEE WITHOUT CARDIOVERSION N/A 08/01/2019   Procedure: TRANSESOPHAGEAL ECHOCARDIOGRAM (TEE);  Surgeon: Grace Isaac, MD;  Location: [facility] OR;  Service: Thoracic;  Laterality: N/A;    There were no vitals filed for this visit.                OT Treatments/Exercises (OP) - 11/13/19 0001      Shoulder Exercises: ROM/Strengthening   Other  ROM/Strengthening Exercises  Standing internal / external rotation with resistance band (red) also isometric.  Patient required cueing to maintain proper shoulder alignment.      Other ROM/Strengthening Exercises  Reviewed prior HEP       Manual Therapy   Manual therapy comments  gentle joint mobilization to reduce range limitations end range abduction, IR/ER             OT Education - 11/13/19 1801    Education Details  Added IR/ER strengthening    Person(s) Educated  Patient    Methods  Explanation;Demonstration;Verbal cues;Handout    Comprehension  Verbalized understanding;Returned demonstration       OT Short Term Goals - 11/13/19 1719      OT SHORT TERM GOAL #1   Title  Independent with HEP    Status  Achieved      OT SHORT TERM GOAL #2   Title  Pt to demo 125* shoulder flexion for overhead reaching    Status  Achieved      OT SHORT TERM GOAL #3   Title  Pt to perform Rt shoulder abduction to 115* for side reaching    Status  Achieved      OT SHORT TERM GOAL #4   Title  Pt to demo sufficient IR to tuck shirt and don jacket w/ pain less than or equal to  3/10    Status  Achieved        OT Long Term Goals - 11/13/19 1720      OT LONG TERM GOAL #2   Title  Pt to place 3 lb weight on overhead shelf RUE 5/5 trials    Status  Achieved            Plan - 11/13/19 1802    Clinical Impression Statement  Patient continues to show excellent progress, anticipate early discharge - discussed this with patient    OT Frequency  1x / week    OT Duration  8 weeks    OT Treatment/Interventions  Self-care/ADL training;Therapeutic exercise;Neuromuscular education;Manual Therapy;Ultrasound;Therapeutic activities;Cryotherapy;DME and/or AE instruction;Scar mobilization;Moist Heat;Passive range of motion;Patient/family education    Plan  review HEP, remaining goals - add strengthening exercises if warranted    OT Home Exercise Plan  shoulder supine and standing exercises     Consulted and Agree with Plan of Care  Patient       Patient will benefit from skilled therapeutic intervention in order to improve the following deficits and impairments:           Visit Diagnosis: Stiffness of right shoulder, not elsewhere classified  Acute pain of right shoulder  Muscle weakness (generalized)    Problem List Patient Active Problem List   Diagnosis Date Noted  . Paraspinal abscess (HCC)   . S/P chest tube placement (HCC)   . Acute hematogenous osteomyelitis, right humerus (HCC)   . Abscess of right heel 08/07/2019  . Acute osteomyelitis of lumbar spine (HCC) 08/07/2019  . Staphylococcus aureus bacteremia 08/02/2019  . Chest wall abscess 08/02/2019  . Sepsis (HCC) 08/01/2019  . Abscess of paraspinous muscles 08/01/2019  . Osteomyelitis of lumbar spine (HCC) 08/01/2019  . Abscess in epidural space of lumbar spine 08/01/2019  . Abnormal chest CT 08/01/2019  . Septic embolism (HCC) 08/01/2019  . Community acquired pneumonia 08/01/2019  . IV drug abuse (HCC) 08/01/2019  . Heroin abuse (HCC) 08/01/2019  . Elevated troponin 08/01/2019  . UTI (urinary tract infection) 08/01/2019  . Renal failure 08/01/2019  . Hyperglycemia 08/01/2019  . Suspected endocarditis 08/01/2019    Collier Salina, OTR/L 11/13/2019, 6:04 PM  [facility] [address] [place], [place], [address] Phone: [phone]   Fax:  [phone]  Name: William [name] MRN: [id number] Date of Birth: 09-Aug-1981

## 2019-11-19 ENCOUNTER — Ambulatory Visit: Payer: BC Managed Care – PPO | Admitting: Occupational Therapy

## 2019-11-28 ENCOUNTER — Other Ambulatory Visit: Payer: Self-pay

## 2019-11-28 ENCOUNTER — Ambulatory Visit: Payer: BC Managed Care – PPO | Admitting: Occupational Therapy

## 2019-11-28 DIAGNOSIS — M25611 Stiffness of right shoulder, not elsewhere classified: Secondary | ICD-10-CM | POA: Diagnosis not present

## 2019-11-28 DIAGNOSIS — M6281 Muscle weakness (generalized): Secondary | ICD-10-CM | POA: Diagnosis not present

## 2019-11-28 DIAGNOSIS — M25511 Pain in right shoulder: Secondary | ICD-10-CM | POA: Diagnosis not present

## 2019-11-28 NOTE — Therapy (Signed)
[facility] [address] [place], [place], [address] Phone: [phone]   Fax:  [phone]  Occupational Therapy Treatment  Patient Details  Name: William [name] MRN: [id number] Date of Birth: 10/26/1981 Referring Provider (OT): Dr. Carole Hall   Encounter Date: 11/28/2019  OT End of Session - 11/28/19 1319    Visit Number  5    Number of Visits  8    Date for OT Re-Evaluation  12/15/19    Authorization Type  BC/BS    OT Start Time  1240    OT Stop Time  1315    OT Time Calculation (min)  35 min       Past Medical History:  Diagnosis Date  . Staph infection 07/2019   CHEST WALL    Past Surgical History:  Procedure Laterality Date  . APPLICATION OF WOUND VAC N/A 08/10/2019   Procedure: WOUND VAC CHANGE;  Surgeon: Van Trigt, Peter, MD;  Location[facility];  Service: Thoracic;  Laterality: N/A;  . I & D EXTREMITY Right 09/13/2019   Procedure: IRRIGATION AND DEBRIDEMENT EXTREMITY;  Surgeon: Creighton, James J III, MD;  Location: [facility] OR;  Service: Orthopedics;  Laterality: Right;  . STERNAL WOUND DEBRIDEMENT Left 08/01/2019   Procedure: I&D Left Chest wall abscess with application of wound vac;  Surgeon: Gerhardt, Edward B, MD;  Location: [facility] OR;  Service: Thoracic;  Laterality: Left;  . STERNAL WOUND DEBRIDEMENT N/A 08/10/2019   Procedure: WOUND DEBRIDEMENT;  Surgeon: Van Trigt, Peter, MD;  Location: [facility] OR;  Service: Thoracic;  Laterality: N/A;  . TEE WITHOUT CARDIOVERSION N/A 08/01/2019   Procedure: TRANSESOPHAGEAL ECHOCARDIOGRAM (TEE);  Surgeon: Gerhardt, Edward B, MD;  Location[facility];  Service: Thoracic;  Laterality: N/A;    There were no vitals filed for this visit.  Subjective Assessment - 11/28/19 1245    Subjective   I ran out of medicine to help with spasms for my back. My shoulder is doing great    Pertinent History  Pt went into hospital on 07/31/19 w/ sepsis and ARF, abcess in epidural space of lumbar spine,  osteomyelitis of lumbar spine however pt reports back is doing ok. Pt also had osteomyelitis of Rt humerus and septic subdeltoid and acromial bursitis. Pt had 2 surgeries to chest in September and surgery to Rt shoulder 09/13/19 including subdeltoid and subacromial bursa radical excision, saucerization of Rt proximal humerus and humeral shaft    Limitations  NWB RUE -  after Rt shoulder sx 09/13/19, OK for A/ROM, P/ROM, and light strengthening per Dr. Creighton v.o. on 10/15/19 but NWB for at least 6 weeks post-op    Currently in Pain?  No/denies        Reviewed previously issued IR/ER with theraband and added sh flexion, ext, and horizontal abduction with red theraband. Pt demo each x 10 reps.  Assessed remaining LTG's. Pt met all.                    OT Education - 11/28/19 1305    Education Details  Theraband for sh flexion, extension, and horizontal abduction    Person(s) Educated  Patient    Methods  Explanation;Demonstration;Handout    Comprehension  Verbalized understanding;Returned demonstration       OT Short Term Goals - 11/13/19 1719      OT SHORT TERM GOAL #1   Title  Independent with HEP    Status  Achieved      OT SHORT   TERM GOAL #2   Title  Pt to demo 125* shoulder flexion for overhead reaching    Status  Achieved      OT SHORT TERM GOAL #3   Title  Pt to perform Rt shoulder abduction to 115* for side reaching    Status  Achieved      OT SHORT TERM GOAL #4   Title  Pt to demo sufficient IR to tuck shirt and don jacket w/ pain less than or equal to 3/10    Status  Achieved        OT Long Term Goals - 11/28/19 1307      OT LONG TERM GOAL #1   Title  Independent with updated strengthening HEP    Status  Achieved      OT LONG TERM GOAL #2   Title  Pt to place 3 lb weight on overhead shelf RUE 5/5 trials    Status  Achieved   can now do 5 lbs     OT LONG TERM GOAL #3   Title  Pt to perform functional wt bearing through RUE (once cleared)  for sit to stand and supine to sit functional movements    Status  Achieved      OT LONG TERM GOAL #4   Title  Pt to demo sufficient shoulder ROM for all functional high level tasks    Status  Achieved            Plan - 11/28/19 1318    Clinical Impression Statement  Pt has met all STG's and LTG's. Pt progressing with shoulder strength and ROM WFL's.    Occupational performance deficits (Please refer to evaluation for details):  ADL's;IADL's;Work;Leisure    Body Structure / Function / Physical Skills  ROM;IADL;Scar mobility;Body mechanics;Endurance;Skin integrity;Strength;UE functional use;Pain    Rehab Potential  Good    OT Frequency  1x / week    OT Duration  8 weeks    OT Treatment/Interventions  Self-care/ADL training;Therapeutic exercise;Neuromuscular education;Manual Therapy;Ultrasound;Therapeutic activities;Cryotherapy;DME and/or AE instruction;Scar mobilization;Moist Heat;Passive range of motion;Patient/family education    Plan  D/C O.T.       Patient will benefit from skilled therapeutic intervention in order to improve the following deficits and impairments:   Body Structure / Function / Physical Skills: ROM, IADL, Scar mobility, Body mechanics, Endurance, Skin integrity, Strength, UE functional use, Pain       Visit Diagnosis: Stiffness of right shoulder, not elsewhere classified  Muscle weakness (generalized)    Problem List Patient Active Problem List   Diagnosis Date Noted  . Paraspinal abscess (HCC)   . S/P chest tube placement (HCC)   . Acute hematogenous osteomyelitis, right humerus (HCC)   . Abscess of right heel 08/07/2019  . Acute osteomyelitis of lumbar spine (HCC) 08/07/2019  . Staphylococcus aureus bacteremia 08/02/2019  . Chest wall abscess 08/02/2019  . Sepsis (HCC) 08/01/2019  . Abscess of paraspinous muscles 08/01/2019  . Osteomyelitis of lumbar spine (HCC) 08/01/2019  . Abscess in epidural space of lumbar spine 08/01/2019  . Abnormal  chest CT 08/01/2019  . Septic embolism (HCC) 08/01/2019  . Community acquired pneumonia 08/01/2019  . IV drug abuse (HCC) 08/01/2019  . Heroin abuse (HCC) 08/01/2019  . Elevated troponin 08/01/2019  . UTI (urinary tract infection) 08/01/2019  . Renal failure 08/01/2019  . Hyperglycemia 08/01/2019  . Suspected endocarditis 08/01/2019    OCCUPATIONAL THERAPY DISCHARGE SUMMARY  Visits from Start of Care: 5  Current functional level related to goals /   functional outcomes: See above   Remaining deficits: Mild deficits Rt shoulder strength (IR/ER) but does not impact function   Education / Equipment: HEP's  Plan: Patient agrees to discharge.  Patient goals were met. Patient is being discharged due to meeting the stated rehab goals.  ?????       Carey Bullocks, OTR/L 11/28/2019, 1:21 PM  [facility] [address] [place], [place], [address] Phone: [phone]   Fax:  [phone]  Name: William [name] MRN: [id number] Date of Birth: 12/29/1980

## 2019-11-28 NOTE — Patient Instructions (Signed)
   Strengthening: Resisted Flexion   Hold tubing with __Rt___ arm(s) at side. Pull forward and up. Move shoulder through pain-free range of motion. Repeat __10__ times per set.  Do _1-2_ sessions per day , every other day   Strengthening: Resisted Extension   Hold tubing in _BOTH____ hand(s), arm forward. Pull arm back, elbow straight. Repeat _10___ times per set. Do _1-2___ sessions per day, every other day.   Resisted Horizontal Abduction: Bilateral   Sit or stand, tubing in both hands, arms out in front. Keeping arms straight, pinch shoulder blades together and stretch arms out. Repeat _10___ times per set. Do _1-2___ sessions per day, every other day.

## 2020-02-12 ENCOUNTER — Ambulatory Visit (INDEPENDENT_AMBULATORY_CARE_PROVIDER_SITE_OTHER): Payer: BC Managed Care – PPO | Admitting: Infectious Diseases

## 2020-02-12 ENCOUNTER — Encounter: Payer: Self-pay | Admitting: Infectious Diseases

## 2020-02-12 ENCOUNTER — Inpatient Hospital Stay: Payer: Self-pay | Admitting: Infectious Diseases

## 2020-02-12 ENCOUNTER — Other Ambulatory Visit: Payer: Self-pay

## 2020-02-12 VITALS — BP 153/93 | HR 112 | Temp 98.5°F | Ht 75.0 in | Wt 179.0 lb

## 2020-02-12 DIAGNOSIS — F1110 Opioid abuse, uncomplicated: Secondary | ICD-10-CM

## 2020-02-12 DIAGNOSIS — Z8739 Personal history of other diseases of the musculoskeletal system and connective tissue: Secondary | ICD-10-CM

## 2020-02-12 DIAGNOSIS — M86021 Acute hematogenous osteomyelitis, right humerus: Secondary | ICD-10-CM

## 2020-02-12 DIAGNOSIS — L02213 Cutaneous abscess of chest wall: Secondary | ICD-10-CM

## 2020-02-12 DIAGNOSIS — L02611 Cutaneous abscess of right foot: Secondary | ICD-10-CM

## 2020-02-12 DIAGNOSIS — M4626 Osteomyelitis of vertebra, lumbar region: Secondary | ICD-10-CM

## 2020-02-12 DIAGNOSIS — F1990 Other psychoactive substance use, unspecified, uncomplicated: Secondary | ICD-10-CM

## 2020-02-12 DIAGNOSIS — R7881 Bacteremia: Secondary | ICD-10-CM

## 2020-02-12 DIAGNOSIS — B9561 Methicillin susceptible Staphylococcus aureus infection as the cause of diseases classified elsewhere: Secondary | ICD-10-CM

## 2020-02-12 NOTE — Progress Notes (Signed)
Patient: William [name]  DOB: Dec 16, 1980 MRN: [id number] PCP: Patient, No Pcp Per    Patient Active Problem List   Diagnosis Date Noted  . Paraspinal abscess ([place])   . S/P chest tube placement ([place])   . Sepsis ([place]) 08/01/2019  . History of osteomyelitis L-Spine with Epidural Abscess 08/01/2019  . Septic embolism ([place]) 08/01/2019  . Injection of illicit drug within last 12 months 08/01/2019  . Heroin abuse ([place]) 08/01/2019  . Elevated troponin 08/01/2019  . UTI (urinary tract infection) 08/01/2019  . Renal failure 08/01/2019  . Hyperglycemia 08/01/2019      Subjective:  William [name] is a 39 y.o. male here for hospital follow up.   Last seen during hospitalization in October 02, 2019 by Dr. Linus Salmons with 8 disseminated methicillin sensitive staphylococcus aureus infection involving chest wall abscess (debrided by Dr. Servando Snare 08/02/2019 and again 08/10/2019), R shoulder septic arthritis and right heel abscess with secondary bacteremia.   His only complaint today is lower back pain that is new over the last few days but easing off - started playing drums again and feels like he over-exerted himself with long session.   Started using IV heroin again 1/5 months ago. Regular use every day to avoid withdrawals. He does not re-use supplies and does not share equipment with peers. HIV, Hep B and Hep C negative last in 07-2019. He has plans to enroll in [facility] tomorrow for inpatient treatment for opoid use disorder. Feels like he is finally going for "himself" this time. He is unsure exactly what event triggered a relapse.   R shoulder - every morning it is stiff since his infection but that eases off with a hot shower.   Requesting refills for trazodone today.    Review of Systems  Constitutional: Negative for chills, fever, malaise/fatigue and weight loss.  HENT: Negative for sore throat.   Respiratory: Negative for cough, sputum production and shortness of breath.     Cardiovascular: Negative.   Gastrointestinal: Negative for abdominal pain, diarrhea and vomiting.  Musculoskeletal: Positive for back pain. Negative for joint pain, myalgias and neck pain.  Skin: Negative for rash.  Neurological: Negative for headaches.  Psychiatric/Behavioral: Positive for substance abuse. Negative for depression. The patient is not nervous/anxious.      Past Medical History:  Diagnosis Date  . Staph infection 07/2019   CHEST WALL    Outpatient Medications Prior to Visit  Medication Sig Dispense Refill  . cyclobenzaprine (FLEXERIL) 5 MG tablet Take 1 tablet (5 mg total) by mouth daily as needed for muscle spasms. 20 tablet 0  . Multiple Vitamin (MULTIVITAMIN WITH MINERALS) TABS tablet Take 1 tablet by mouth daily. 30 tablet 0  . traZODone (DESYREL) 50 MG tablet Take 1 tablet (50 mg total) by mouth at bedtime as needed for sleep. 20 tablet 0  . metFORMIN (GLUCOPHAGE) 500 MG tablet Take 1 tablet (500 mg total) by mouth 2 (two) times daily with a meal. (Patient not taking: Reported on 02/12/2020) 180 tablet 3  . metoprolol tartrate (LOPRESSOR) 25 MG tablet Take 0.5 tablets (12.5 mg total) by mouth 2 (two) times daily. (Patient not taking: Reported on 02/12/2020) 120 tablet 0   No facility-administered medications prior to visit.     No Known Allergies  Social History   Tobacco Use  . Smoking status: Never Smoker  . Smokeless tobacco: Never Used  Substance Use Topics  . Alcohol use: Not Currently  . Drug use: Yes  Types: Heroin, Methamphetamines, IV    Comment: opiates     History reviewed. No pertinent family history.  Objective:   Vitals:   02/12/20 1114  BP: (!) 153/93  Pulse: (!) 112  Temp: 98.5 F (36.9 C)  SpO2: 99%  Weight: 179 lb (81.2 kg)  Height: 6\' 3"  (1.905 m)   Body mass index is 22.37 kg/m.  Physical Exam Constitutional:      Appearance: He is well-developed.     Comments: Seated comfortably in chair.   HENT:      Mouth/Throat:     Dentition: Normal dentition. No dental abscesses.  Cardiovascular:     Rate and Rhythm: Normal rate.  Pulmonary:     Effort: Pulmonary effort is normal.  Abdominal:     General: There is no distension.     Palpations: Abdomen is soft.     Tenderness: There is no abdominal tenderness.  Skin:    General: Skin is warm and dry.     Findings: No rash.  Neurological:     Mental Status: He is alert and oriented to person, place, and time.  Psychiatric:        Judgment: Judgment normal.     Comments: In good spirits today.      Lab Results: Lab Results  Component Value Date   WBC 4.3 09/26/2019   HGB 11.7 (L) 09/26/2019   HCT 36.5 (L) 09/26/2019   MCV 92.9 09/26/2019   PLT 341 09/26/2019    Lab Results  Component Value Date   CREATININE 0.88 09/26/2019   BUN 24 (H) 09/26/2019   NA 140 09/26/2019   K 4.1 09/26/2019   CL 105 09/26/2019   CO2 24 09/26/2019    Lab Results  Component Value Date   ALT 12 08/31/2019   AST 15 08/31/2019   ALKPHOS 90 08/31/2019   BILITOT 1.2 08/31/2019     Assessment & Plan:   Problem List Items Addressed This Visit      High   RESOLVED: Staphylococcus aureus bacteremia    No findings concerning for relapsing bacteremia today off antibiotics for 5 months now.       RESOLVED: Chest wall abscess    Resolved following debridement and prolonged antibiotics.       RESOLVED: Acute osteomyelitis of lumbar spine (HCC)    Resolved clinically following appropriate treatment and prolonged antibiotics. His current complaint of back pain is c/w musculoskeletal injury and improving with conservative therapy and time.       RESOLVED: Abscess of right heel    Resolved         Unprioritized   Injection of illicit drug within last 12 months    Encouraged follow through with plans for inpatient treatment for opioid use disorder.  He was negative for HIV/Hep B and C last fall and declines repeating these labs today.        History of osteomyelitis L-Spine with Epidural Abscess    No findings concerning for ongoing infection now 5 months off antibiotics at this time. Extremely unlikely he has persistent infection at this time. Counseled that he will be at continued risk for re-infection should he continue injection drug use. He understands and is working on this now with plans for outpatient treatment.       Heroin abuse (HCC) - Primary    Seeking outpatient treatment through [facility].       RESOLVED: Acute hematogenous osteomyelitis, right humerus (HCC)    Some residual post-infectious  stiffness in the morning that clears with warm applications. No concern for ongoing infection at this time.          No need for further follow up with ID team at this time.    Rexene Alberts, MSN, NP-C [facility] Pager: [phone] Office: [phone]  02/12/20  12:44 PM

## 2020-02-12 NOTE — Assessment & Plan Note (Signed)
Resolved clinically following appropriate treatment and prolonged antibiotics. His current complaint of back pain is c/w musculoskeletal injury and improving with conservative therapy and time.

## 2020-02-12 NOTE — Assessment & Plan Note (Signed)
No findings concerning for ongoing infection now 5 months off antibiotics at this time. Extremely unlikely he has persistent infection at this time. Counseled that he will be at continued risk for re-infection should he continue injection drug use. He understands and is working on this now with plans for outpatient treatment.

## 2020-02-12 NOTE — Assessment & Plan Note (Signed)
Seeking outpatient treatment through [facility].

## 2020-02-12 NOTE — Assessment & Plan Note (Signed)
Resolved following debridement and prolonged antibiotics.

## 2020-02-12 NOTE — Assessment & Plan Note (Signed)
No findings concerning for relapsing bacteremia today off antibiotics for 5 months now.

## 2020-02-12 NOTE — Assessment & Plan Note (Signed)
Resolved

## 2020-02-12 NOTE — Patient Instructions (Signed)
Nice to see you today!  Looks like your infections have been cured with the time spent in the hospital.   Your right shoulder may never be completely back to normal but I am encouraged that the lingering changes are not limiting to you - especially the drums :)   I am hopeful that [facility] will work well for you - talk with them about the Trazodone prescription  I would also recommend you establish care with a primary care team locally - I am happy to help you do this if you need help - just give [place] a call back.   No further follow up here at this time.    Best wishes and be well!

## 2020-02-12 NOTE — Assessment & Plan Note (Signed)
Some residual post-infectious stiffness in the morning that clears with warm applications. No concern for ongoing infection at this time.

## 2020-02-12 NOTE — Assessment & Plan Note (Signed)
Encouraged follow through with plans for inpatient treatment for opioid use disorder.  He was negative for HIV/Hep B and C last fall and declines repeating these labs today.

## 2020-04-03 DIAGNOSIS — L905 Scar conditions and fibrosis of skin: Secondary | ICD-10-CM | POA: Diagnosis not present

## 2020-04-03 DIAGNOSIS — F1111 Opioid abuse, in remission: Secondary | ICD-10-CM | POA: Diagnosis not present

## 2020-04-17 DIAGNOSIS — R197 Diarrhea, unspecified: Secondary | ICD-10-CM | POA: Diagnosis not present

## 2020-04-18 DIAGNOSIS — R197 Diarrhea, unspecified: Secondary | ICD-10-CM | POA: Diagnosis not present

## 2020-04-23 DIAGNOSIS — R197 Diarrhea, unspecified: Secondary | ICD-10-CM | POA: Diagnosis not present

## 2020-04-23 DIAGNOSIS — L700 Acne vulgaris: Secondary | ICD-10-CM | POA: Diagnosis not present

## 2020-04-23 DIAGNOSIS — M545 Low back pain: Secondary | ICD-10-CM | POA: Diagnosis not present

## 2020-05-27 ENCOUNTER — Other Ambulatory Visit: Payer: Self-pay | Admitting: Physician Assistant

## 2020-05-27 DIAGNOSIS — M545 Low back pain, unspecified: Secondary | ICD-10-CM

## 2020-05-29 ENCOUNTER — Other Ambulatory Visit: Payer: BC Managed Care – PPO

## 2020-06-15 ENCOUNTER — Ambulatory Visit
Admission: RE | Admit: 2020-06-15 | Discharge: 2020-06-15 | Disposition: A | Discharge status: Home / Self Care | Payer: BC Managed Care – PPO | Source: Ambulatory Visit | Attending: Physician Assistant | Admitting: Physician Assistant

## 2020-06-15 ENCOUNTER — Other Ambulatory Visit: Payer: Self-pay

## 2020-06-15 DIAGNOSIS — M4626 Osteomyelitis of vertebra, lumbar region: Secondary | ICD-10-CM | POA: Diagnosis not present

## 2020-06-15 DIAGNOSIS — M48061 Spinal stenosis, lumbar region without neurogenic claudication: Secondary | ICD-10-CM | POA: Diagnosis not present

## 2020-06-15 DIAGNOSIS — M5126 Other intervertebral disc displacement, lumbar region: Secondary | ICD-10-CM | POA: Diagnosis not present

## 2020-06-15 DIAGNOSIS — M545 Low back pain, unspecified: Secondary | ICD-10-CM

## 2020-06-15 DIAGNOSIS — M47816 Spondylosis without myelopathy or radiculopathy, lumbar region: Secondary | ICD-10-CM | POA: Diagnosis not present

## 2020-06-15 MED ORDER — GADOBENATE DIMEGLUMINE 529 MG/ML IV SOLN
17.0000 mL | Freq: Once | INTRAVENOUS | Status: AC | PRN
  Administered 2020-06-15: 17 mL via INTRAVENOUS

## 2020-07-07 DIAGNOSIS — M5489 Other dorsalgia: Secondary | ICD-10-CM | POA: Diagnosis not present

## 2020-07-07 DIAGNOSIS — M545 Low back pain: Secondary | ICD-10-CM | POA: Diagnosis not present

## 2020-07-07 DIAGNOSIS — M5126 Other intervertebral disc displacement, lumbar region: Secondary | ICD-10-CM | POA: Diagnosis not present

## 2020-07-07 DIAGNOSIS — M25659 Stiffness of unspecified hip, not elsewhere classified: Secondary | ICD-10-CM | POA: Diagnosis not present

## 2020-07-10 DIAGNOSIS — M5489 Other dorsalgia: Secondary | ICD-10-CM | POA: Diagnosis not present

## 2020-07-10 DIAGNOSIS — M5126 Other intervertebral disc displacement, lumbar region: Secondary | ICD-10-CM | POA: Diagnosis not present

## 2020-07-10 DIAGNOSIS — M25659 Stiffness of unspecified hip, not elsewhere classified: Secondary | ICD-10-CM | POA: Diagnosis not present

## 2020-07-10 DIAGNOSIS — M545 Low back pain: Secondary | ICD-10-CM | POA: Diagnosis not present

## 2020-11-01 DIAGNOSIS — F1123 Opioid dependence with withdrawal: Secondary | ICD-10-CM | POA: Diagnosis not present

## 2020-11-01 DIAGNOSIS — F1120 Opioid dependence, uncomplicated: Secondary | ICD-10-CM | POA: Diagnosis not present

## 2020-11-08 DIAGNOSIS — F1123 Opioid dependence with withdrawal: Secondary | ICD-10-CM | POA: Diagnosis not present

## 2020-11-08 DIAGNOSIS — F1120 Opioid dependence, uncomplicated: Secondary | ICD-10-CM | POA: Diagnosis not present

## 2021-06-04 ENCOUNTER — Other Ambulatory Visit: Payer: Self-pay | Admitting: Rehabilitation

## 2021-06-04 DIAGNOSIS — M5451 Vertebrogenic low back pain: Secondary | ICD-10-CM

## 2021-06-13 ENCOUNTER — Ambulatory Visit
Admission: RE | Admit: 2021-06-13 | Discharge: 2021-06-13 | Disposition: A | Discharge status: Home / Self Care | Payer: BC Managed Care – PPO | Source: Ambulatory Visit | Attending: Rehabilitation | Admitting: Rehabilitation

## 2021-06-13 ENCOUNTER — Other Ambulatory Visit: Payer: Self-pay

## 2021-06-13 ENCOUNTER — Other Ambulatory Visit: Payer: Self-pay | Admitting: Rehabilitation

## 2021-06-13 DIAGNOSIS — M5451 Vertebrogenic low back pain: Secondary | ICD-10-CM

## 2021-06-15 IMAGING — DX DG CHEST 1V PORT
1 series · 1 of 1 positions shown · non-contrast
Comparison: CT and radiograph 08/01/2019

CLINICAL DATA: Open wound of chest wall

EXAM:
PORTABLE CHEST 1 VIEW

[chest ap]
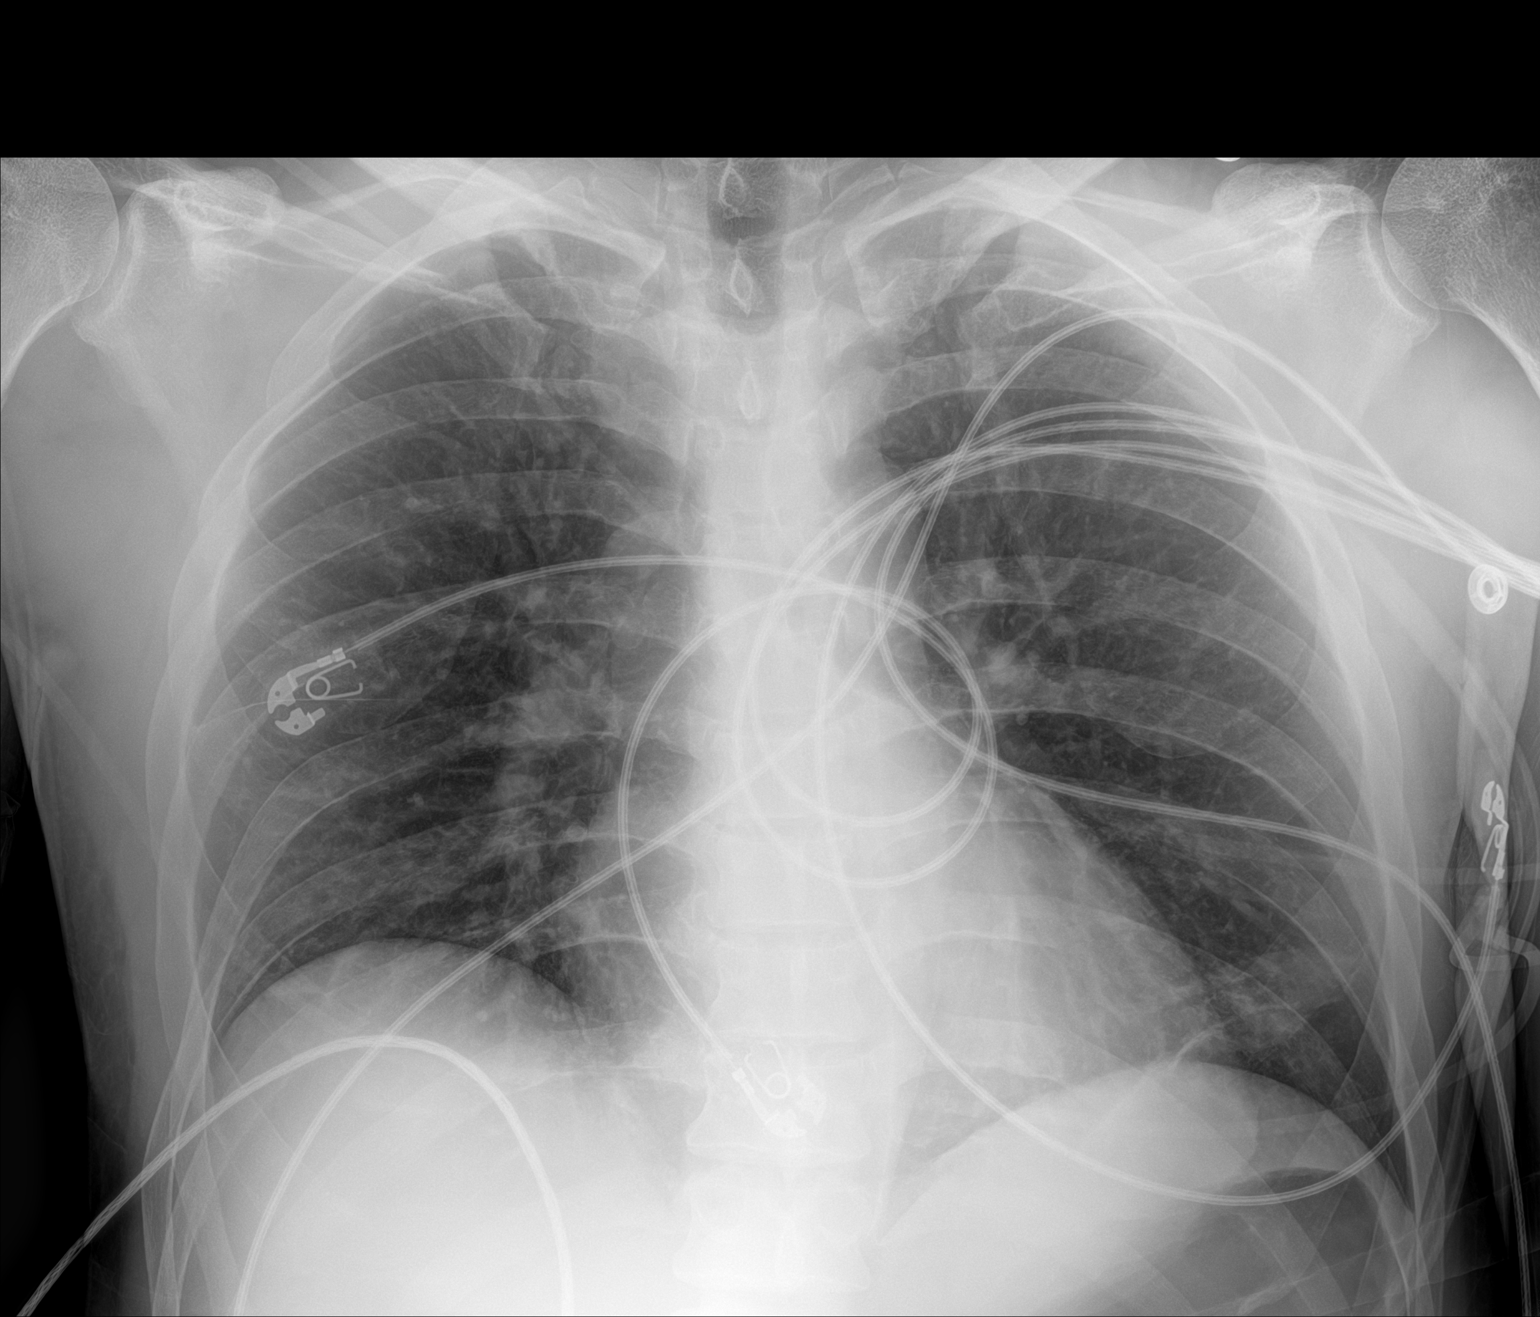

[1 of 1 positions shown; findings below may reference images not displayed]

FINDINGS: Small lucency along the upper left mediastinal border is seen in the
region the previously identified chest wall collection compatible
reported chest wall. Atelectatic changes are present in lungs
including a bandlike area of subsegmental atelectasis in the left
lung base. No pneumothorax or effusion. Central vascular crowding is
present. Cardiomediastinal contours are otherwise. No other acute
osseous or soft tissue abnormalities.
IMPRESSION: 1. Irregular lucency along the upper left mediastinal border
compatible with location of reported open chest wound.
2. Atelectatic changes in the lungs.

## 2021-06-27 IMAGING — CR DG CHEST 2V
2 series · 2 of 2 positions shown · non-contrast
Comparison: Radiographs August 10, 2019.

CLINICAL DATA: Status post chest tube removal.

EXAM:
CHEST - 2 VIEW

[chest lat]
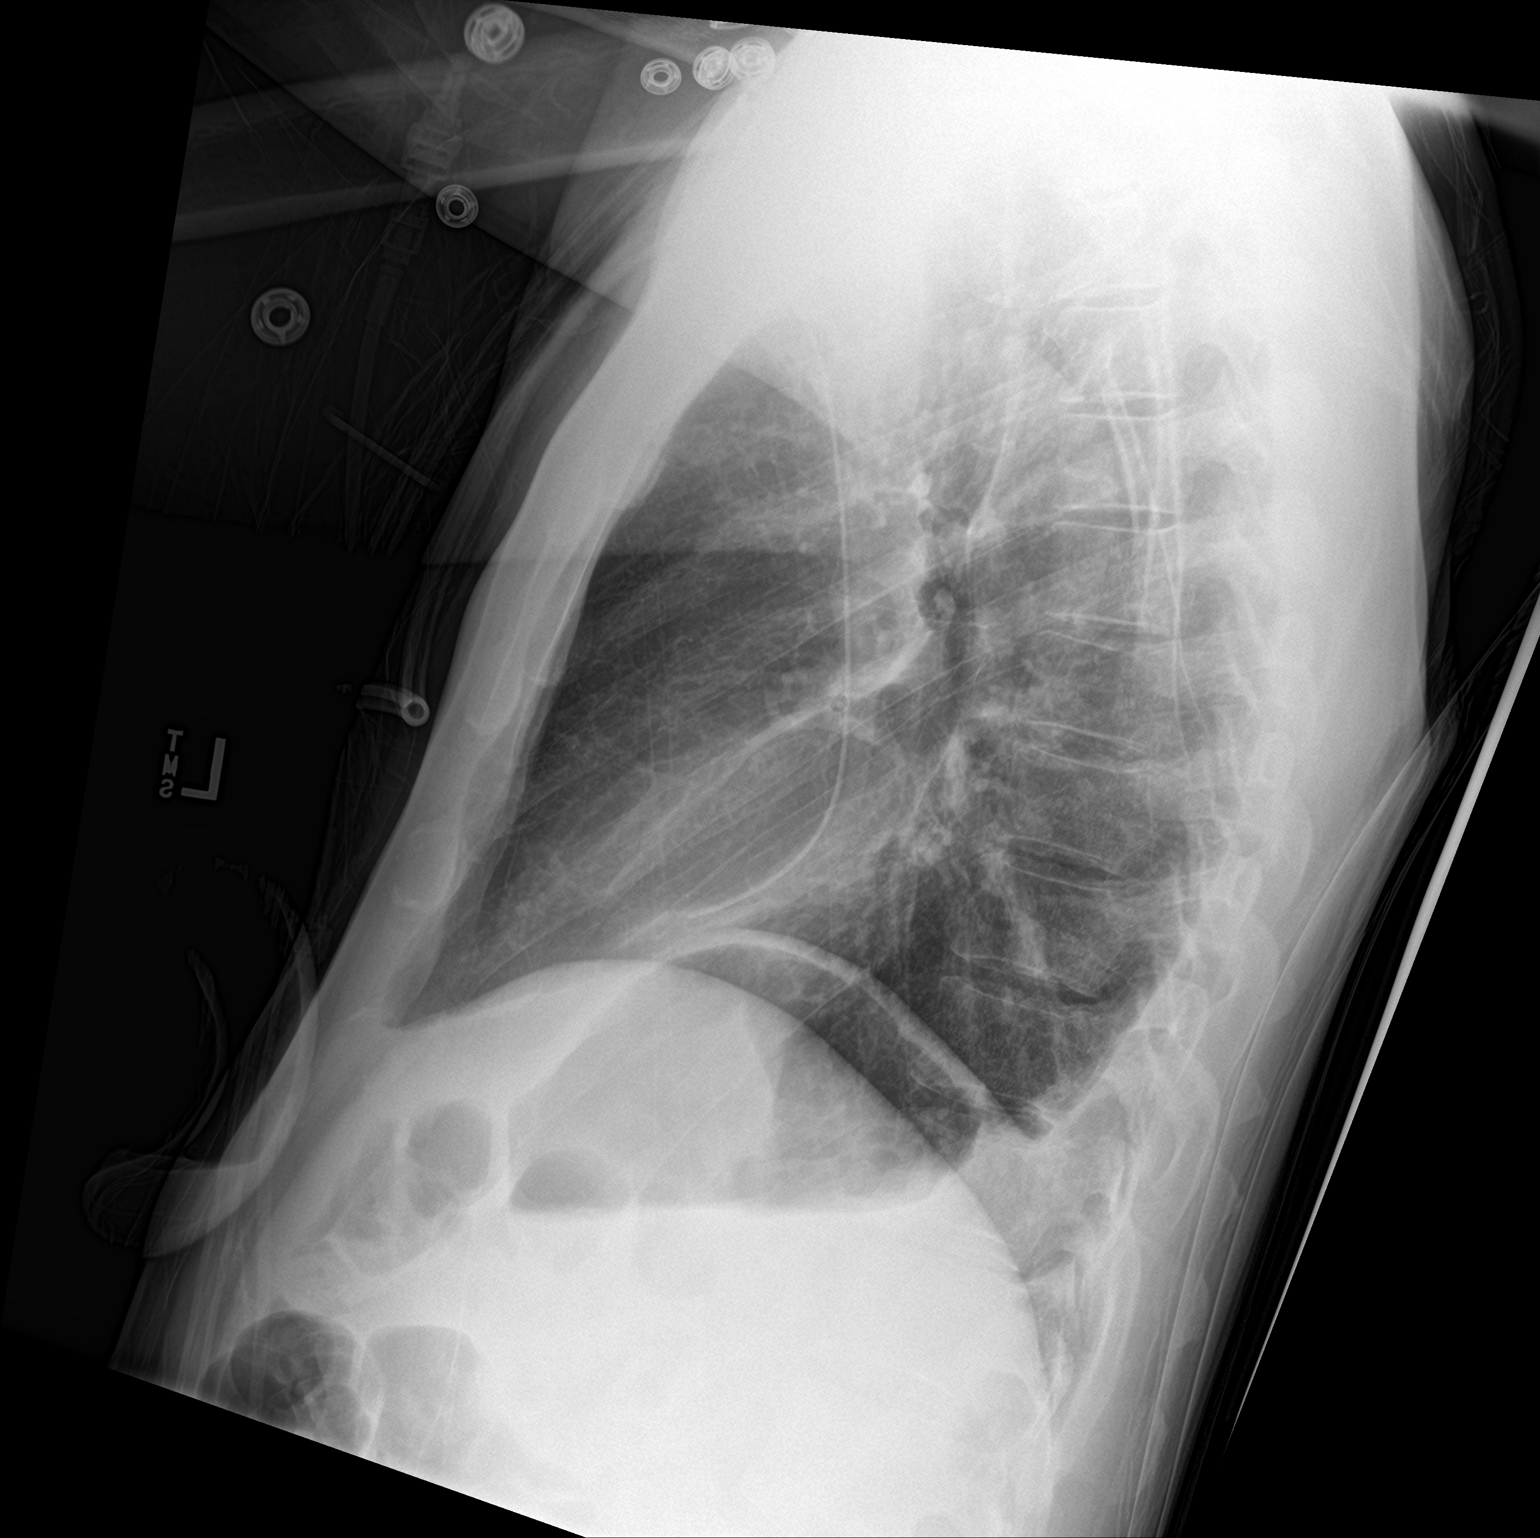

[chest ap]
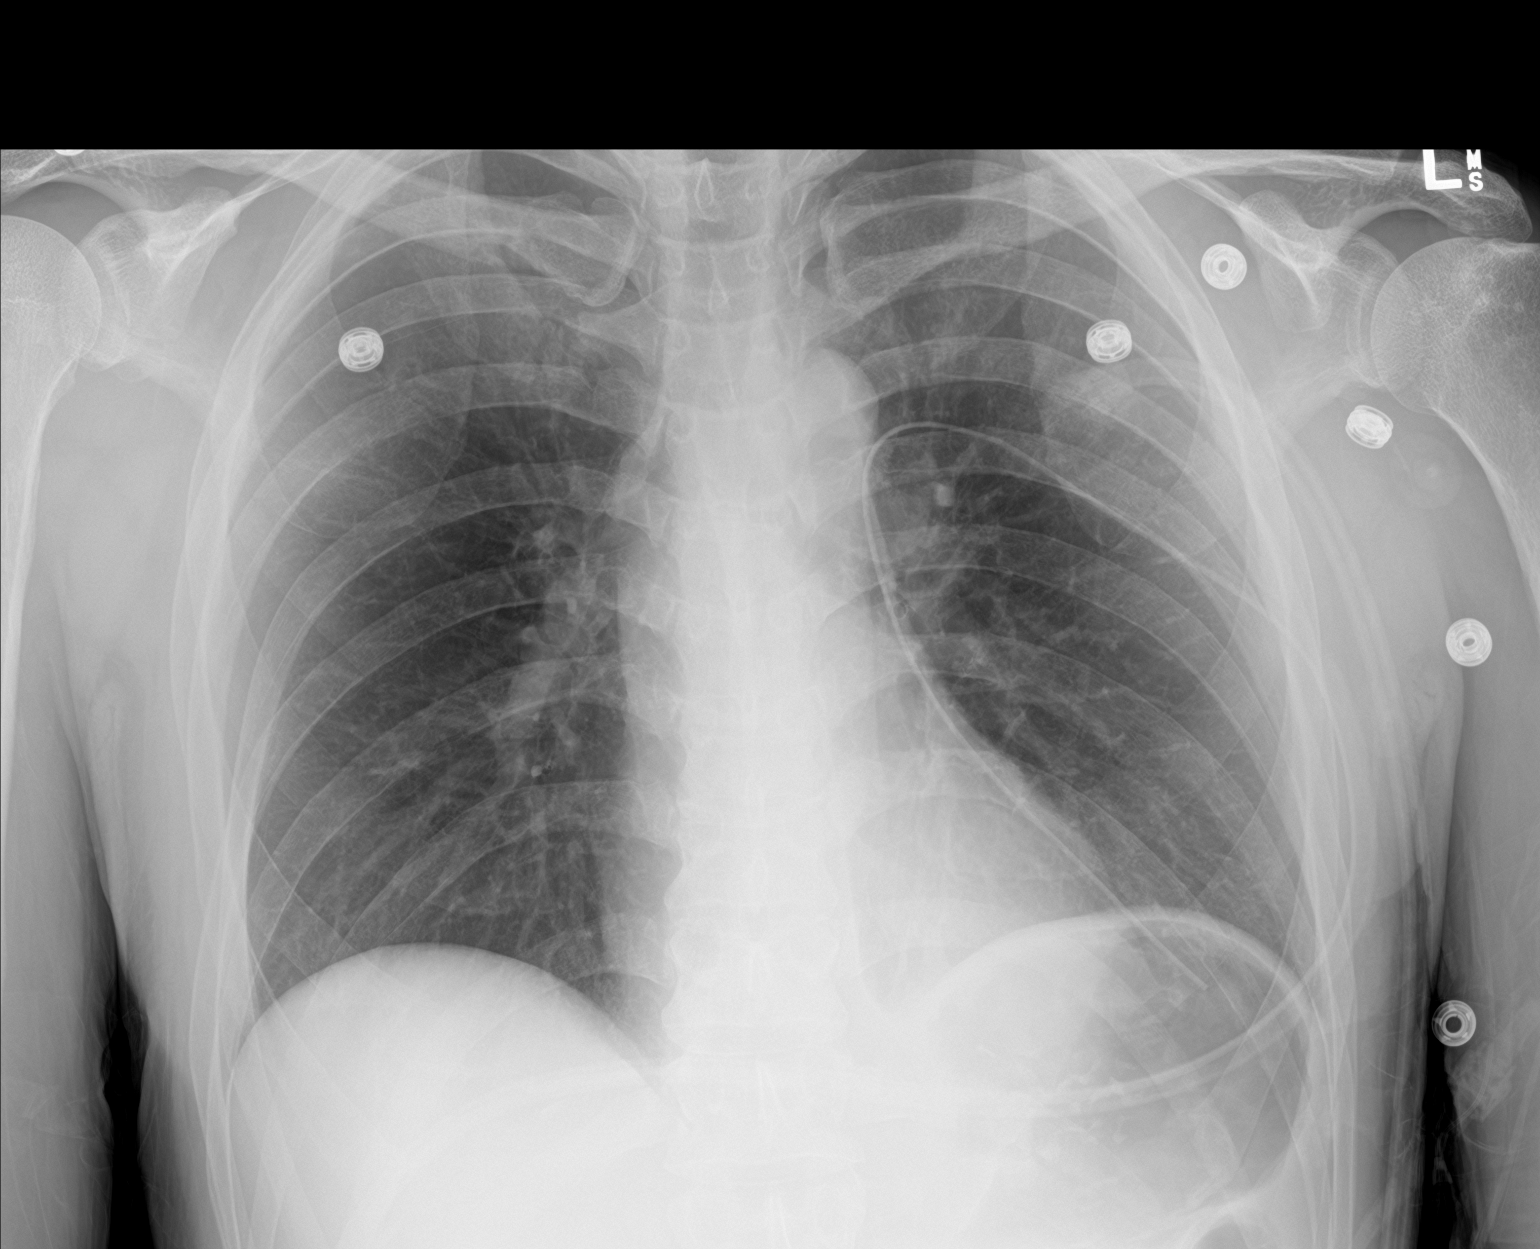

[2 of 2 positions shown; findings below may reference images not displayed]

FINDINGS: The heart size and mediastinal contours are within normal limits.
Endotracheal tube has been removed. Right lung is clear. No
pneumothorax or pleural effusion is noted. Left-sided drainage
catheter is unchanged in position. No definite consolidative process
is noted. The visualized skeletal structures are unremarkable.
IMPRESSION: No active cardiopulmonary disease. Stable position of left-sided
drainage catheter. Endotracheal tube has been removed.

## 2021-08-01 IMAGING — DX DG CHEST 2V
2 series · 2 of 2 positions shown · non-contrast
Comparison: 08/21/2019

CLINICAL DATA: Recent shoulder surgery for infection. Rule out
pneumonia

EXAM:
CHEST - 2 VIEW

[w chest pa]
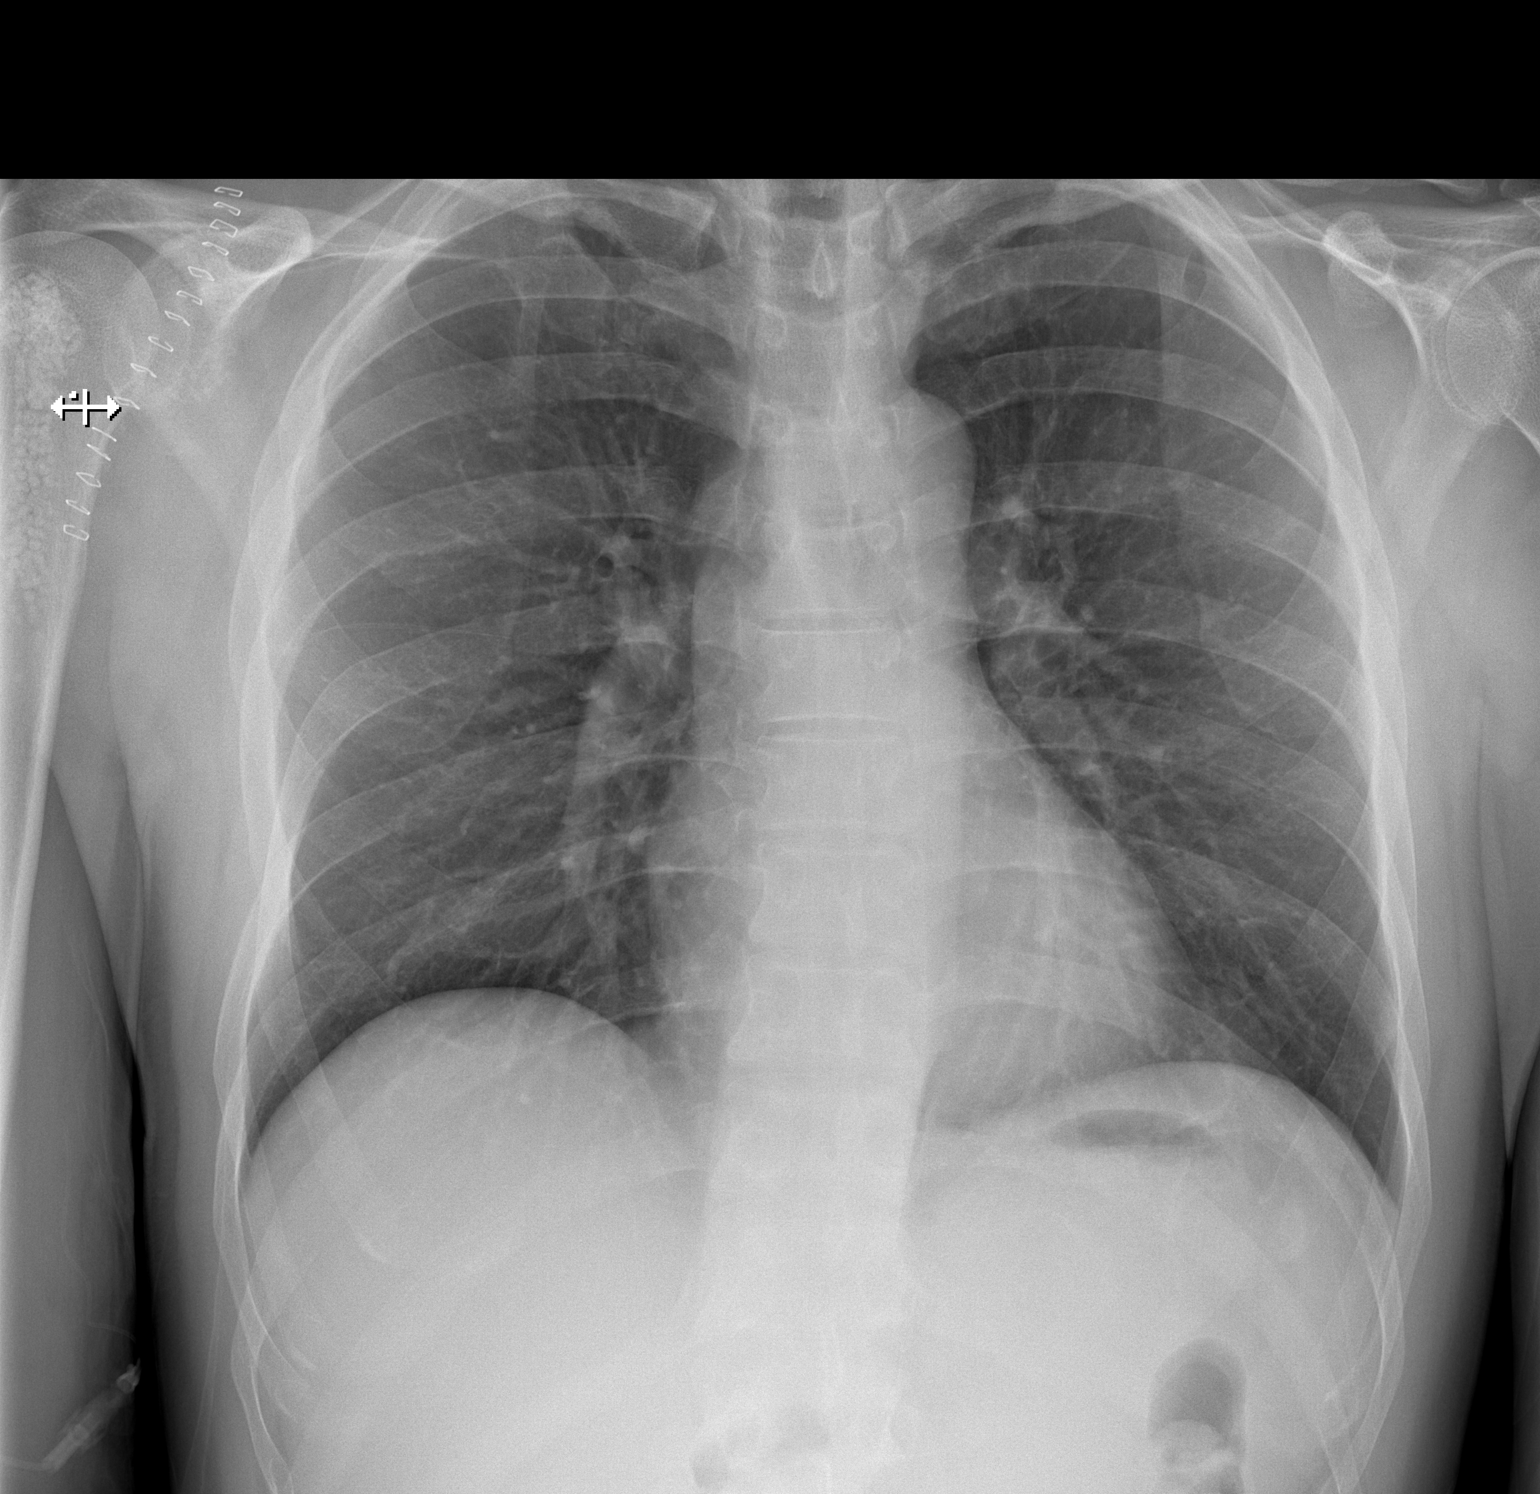

[w chest lat]
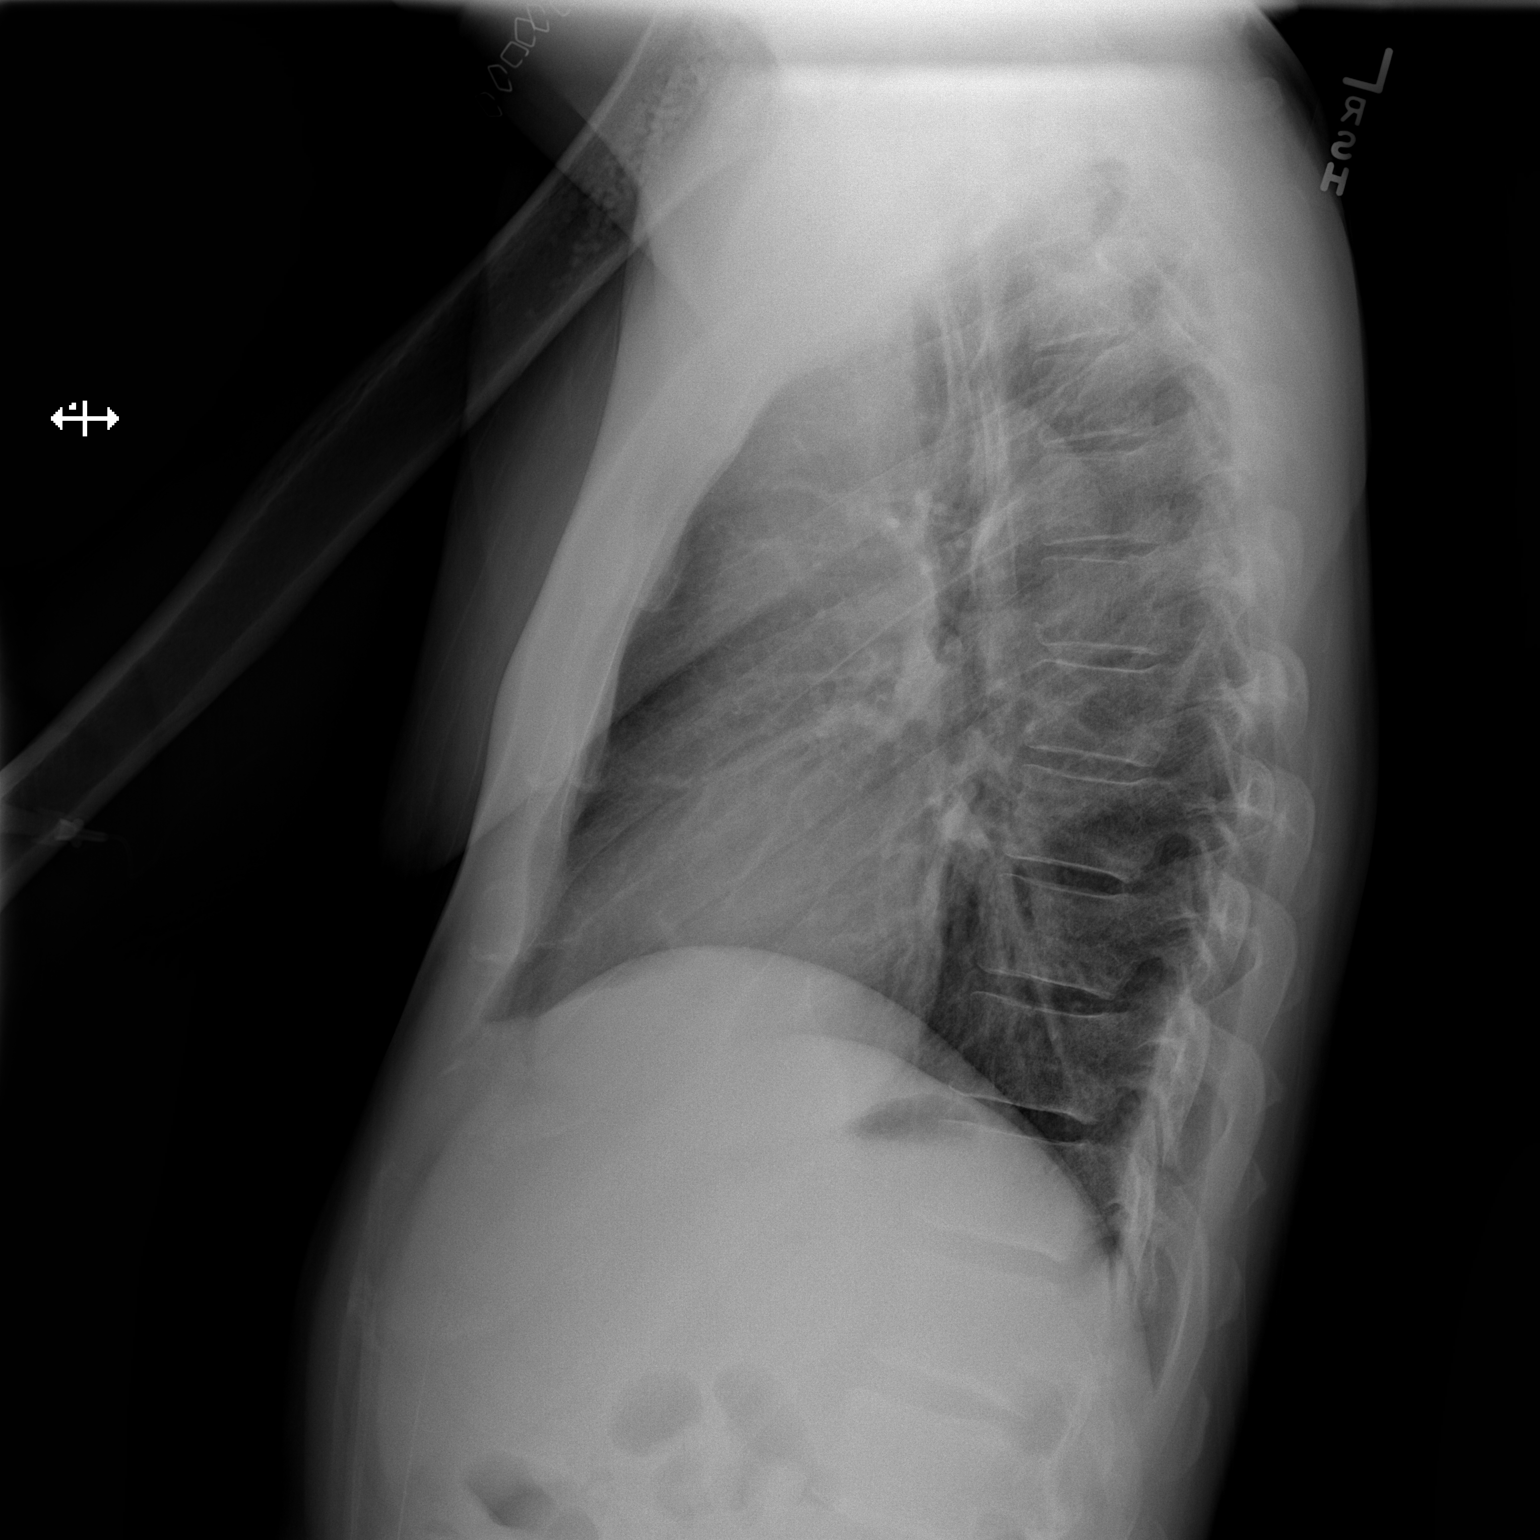

[2 of 2 positions shown; findings below may reference images not displayed]

FINDINGS: Skin staples and probable antibiotic beads over the proximal right
humerus. There is no edema, consolidation, effusion, or
pneumothorax. Normal heart size and mediastinal contours.
IMPRESSION: No evidence of cardiopulmonary disease.

## 2021-12-17 ENCOUNTER — Other Ambulatory Visit: Payer: Self-pay

## 2021-12-17 ENCOUNTER — Ambulatory Visit: Payer: BC Managed Care – PPO | Admitting: Podiatry

## 2021-12-17 DIAGNOSIS — B351 Tinea unguium: Secondary | ICD-10-CM

## 2021-12-17 DIAGNOSIS — B353 Tinea pedis: Secondary | ICD-10-CM

## 2021-12-17 MED ORDER — GENTAMICIN SULFATE 0.1 % EX OINT: 1.0000 "application " | TOPICAL_OINTMENT | Freq: Three times a day (TID) | CUTANEOUS | 0 refills | Status: DC

## 2021-12-17 MED ORDER — EFINACONAZOLE 10 % EX SOLN: 1.0000 [drp] | Freq: Every day | CUTANEOUS | 11 refills | Status: AC

## 2021-12-17 MED ORDER — KETOCONAZOLE 2 % EX CREA: 1.0000 "application " | TOPICAL_CREAM | Freq: Every day | CUTANEOUS | 0 refills | Status: DC

## 2021-12-17 NOTE — Patient Instructions (Signed)
You can use the gentamycin antibiotic ointment on the toenail and the jublia  I have sent ketoconazole to the pharmacy for the skin.

## 2021-12-17 NOTE — Progress Notes (Signed)
Subjective:   Patient ID: Sherlon Handing, male   DOB: 41 y.o.   MRN: [id number]   HPI 41 year old male presents the office today for concerns of toenail fungus as well as peeling skin.  He states that he is most concerned about the nails mostly his big toenail causing discoloration.  He has no pain associated with it and denies any swelling or redness or any drainage.  No open sores.  Of note he does have history of drug abuse and recently he has done cocaine today.  No chest pain or shortness of breath.   ROS  Past Medical History:  Diagnosis Date   Staph infection 07/2019   CHEST WALL    Past Surgical History:  Procedure Laterality Date   APPLICATION OF WOUND VAC N/A 08/10/2019   Procedure: WOUND VAC CHANGE;  Surgeon: Kerin Perna, MD;  Location: [facility] OR;  Service: Thoracic;  Laterality: N/A;   I & D EXTREMITY Right 09/13/2019   Procedure: IRRIGATION AND DEBRIDEMENT EXTREMITY;  Surgeon: Ernest Mallick, MD;  Location: [facility] OR;  Service: Orthopedics;  Laterality: Right;   STERNAL WOUND DEBRIDEMENT Left 08/01/2019   Procedure: I&D Left Chest wall abscess with application of wound vac;  Surgeon: Delight Ovens, MD;  Location: [facility] OR;  Service: Thoracic;  Laterality: Left;   STERNAL WOUND DEBRIDEMENT N/A 08/10/2019   Procedure: WOUND DEBRIDEMENT;  Surgeon: Kerin Perna, MD;  Location: [facility] OR;  Service: Thoracic;  Laterality: N/A;   TEE WITHOUT CARDIOVERSION N/A 08/01/2019   Procedure: TRANSESOPHAGEAL ECHOCARDIOGRAM (TEE);  Surgeon: Delight Ovens, MD;  Location: [facility] OR;  Service: Thoracic;  Laterality: N/A;     Current Outpatient Medications:    cyclobenzaprine (FLEXERIL) 5 MG tablet, Take 1 tablet (5 mg total) by mouth daily as needed for muscle spasms., Disp: 20 tablet, Rfl: 0   metFORMIN (GLUCOPHAGE) 500 MG tablet, Take 1 tablet (500 mg total) by mouth 2 (two) times daily with a meal. (Patient not taking: Reported on 02/12/2020), Disp: 180 tablet, Rfl: 3   metoprolol  tartrate (LOPRESSOR) 25 MG tablet, Take 0.5 tablets (12.5 mg total) by mouth 2 (two) times daily. (Patient not taking: Reported on 02/12/2020), Disp: 120 tablet, Rfl: 0   Multiple Vitamin (MULTIVITAMIN WITH MINERALS) TABS tablet, Take 1 tablet by mouth daily., Disp: 30 tablet, Rfl: 0   traZODone (DESYREL) 50 MG tablet, Take 1 tablet (50 mg total) by mouth at bedtime as needed for sleep., Disp: 20 tablet, Rfl: 0  No Known Allergies       Objective:  Physical Exam  General: AAO x3, NAD  Dermatological: Dry, peeling skin present plantar aspect of foot.  Interdigitally there is dry, pink, erythematous skin consistent with tinea pedis.  No open sores or drainage.  Nails appear to be mildly hypertrophic and dystrophic with yellow discoloration.  No pain of the nails there is no swelling or redness of the toenail sites.  Most effective the hallux toenails.  Vascular: Dorsalis Pedis artery and Posterior Tibial artery pedal pulses are 2/4 bilateral with immedate capillary fill time.There is no pain with calf compression, swelling, warmth, erythema.   Neruologic: Grossly intact via light touch bilateral.   Musculoskeletal: No gross boney pedal deformities bilateral. No pain, crepitus, or limitation noted with foot and ankle range of motion bilateral. Muscular strength 5/5 in all groups tested bilateral.  Gait: Unassisted, Nonantalgic.       Assessment:   Onychomycosis, tinea pedis/dry skin     Plan:  -  Treatment options discussed including all alternatives, risks, and complications -Etiology of symptoms were discussed -Can hold off on oral medication given side effects.  Prescribed topical medication for the nails.  I ordered Jublia for the nails.  He is also wanting to do an antibiotic to help with the nails as well.  Prescribed gentamicin cream. -Ketoconazole for the skin. -Discussed washing his feet with soap and water on a daily basis and changing shoes regularly.  Vivi Barrack  DPM

## 2022-01-29 ENCOUNTER — Other Ambulatory Visit: Payer: Self-pay | Admitting: Podiatry

## 2024-04-02 ENCOUNTER — Encounter (HOSPITAL_BASED_OUTPATIENT_CLINIC_OR_DEPARTMENT_OTHER): Payer: Self-pay | Attending: General Surgery | Admitting: General Surgery
